# Patient Record
Sex: Male | Born: 1950
Health system: Southern US, Community
[De-identification: ages and names within clinical notes are randomized; demographics above are authoritative.]

## PROBLEM LIST (undated history)

## (undated) DIAGNOSIS — G4733 Obstructive sleep apnea (adult) (pediatric): Secondary | ICD-10-CM

## (undated) DIAGNOSIS — I10 Essential (primary) hypertension: Secondary | ICD-10-CM

## (undated) DIAGNOSIS — R0989 Other specified symptoms and signs involving the circulatory and respiratory systems: Secondary | ICD-10-CM

## (undated) DIAGNOSIS — I2510 Atherosclerotic heart disease of native coronary artery without angina pectoris: Secondary | ICD-10-CM

## (undated) DIAGNOSIS — M21371 Foot drop, right foot: Secondary | ICD-10-CM

## (undated) DIAGNOSIS — K219 Gastro-esophageal reflux disease without esophagitis: Secondary | ICD-10-CM

## (undated) DIAGNOSIS — R011 Cardiac murmur, unspecified: Secondary | ICD-10-CM

## (undated) DIAGNOSIS — K4090 Unilateral inguinal hernia, without obstruction or gangrene, not specified as recurrent: Secondary | ICD-10-CM

## (undated) DIAGNOSIS — I4819 Other persistent atrial fibrillation: Secondary | ICD-10-CM

## (undated) DIAGNOSIS — I350 Nonrheumatic aortic (valve) stenosis: Secondary | ICD-10-CM

## (undated) DIAGNOSIS — E119 Type 2 diabetes mellitus without complications: Secondary | ICD-10-CM

## (undated) HISTORY — DX: Nonrheumatic aortic (valve) stenosis: I35.0

## (undated) HISTORY — PX: BACK SURGERY: SHX140

## (undated) HISTORY — PX: HERNIA REPAIR: SHX51

## (undated) HISTORY — DX: Essential (primary) hypertension: I10

## (undated) HISTORY — PX: COLONOSCOPY: SHX174

## (undated) HISTORY — DX: Obstructive sleep apnea (adult) (pediatric): G47.33

## (undated) HISTORY — DX: Other persistent atrial fibrillation: I48.19

## (undated) HISTORY — PX: CERVICAL SPINE SURGERY: SHX589

## (undated) HISTORY — PX: APPENDECTOMY: SHX54

## (undated) HISTORY — PX: EYE SURGERY: SHX253

---
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
## 1998-04-25 ENCOUNTER — Encounter: Payer: Self-pay | Admitting: Cardiology

## 1998-04-25 ENCOUNTER — Ambulatory Visit (HOSPITAL_COMMUNITY): Admission: RE | Admit: 1998-04-25 | Discharge: 1998-04-25 | Payer: Self-pay | Admitting: Cardiology

## 2004-10-26 ENCOUNTER — Ambulatory Visit (HOSPITAL_COMMUNITY): Admission: RE | Admit: 2004-10-26 | Discharge: 2004-10-26 | Payer: Self-pay | Admitting: Chiropractic Medicine

## 2004-10-26 IMAGING — CR DG FOOT COMPLETE 3+V*R*
3 series · 3 of 3 positions shown · non-contrast
Comparison: None.

CLINICAL DATA: Injured right foot. 
 RIGHT FOOT - THREE VIEW:

[t foot ap right]
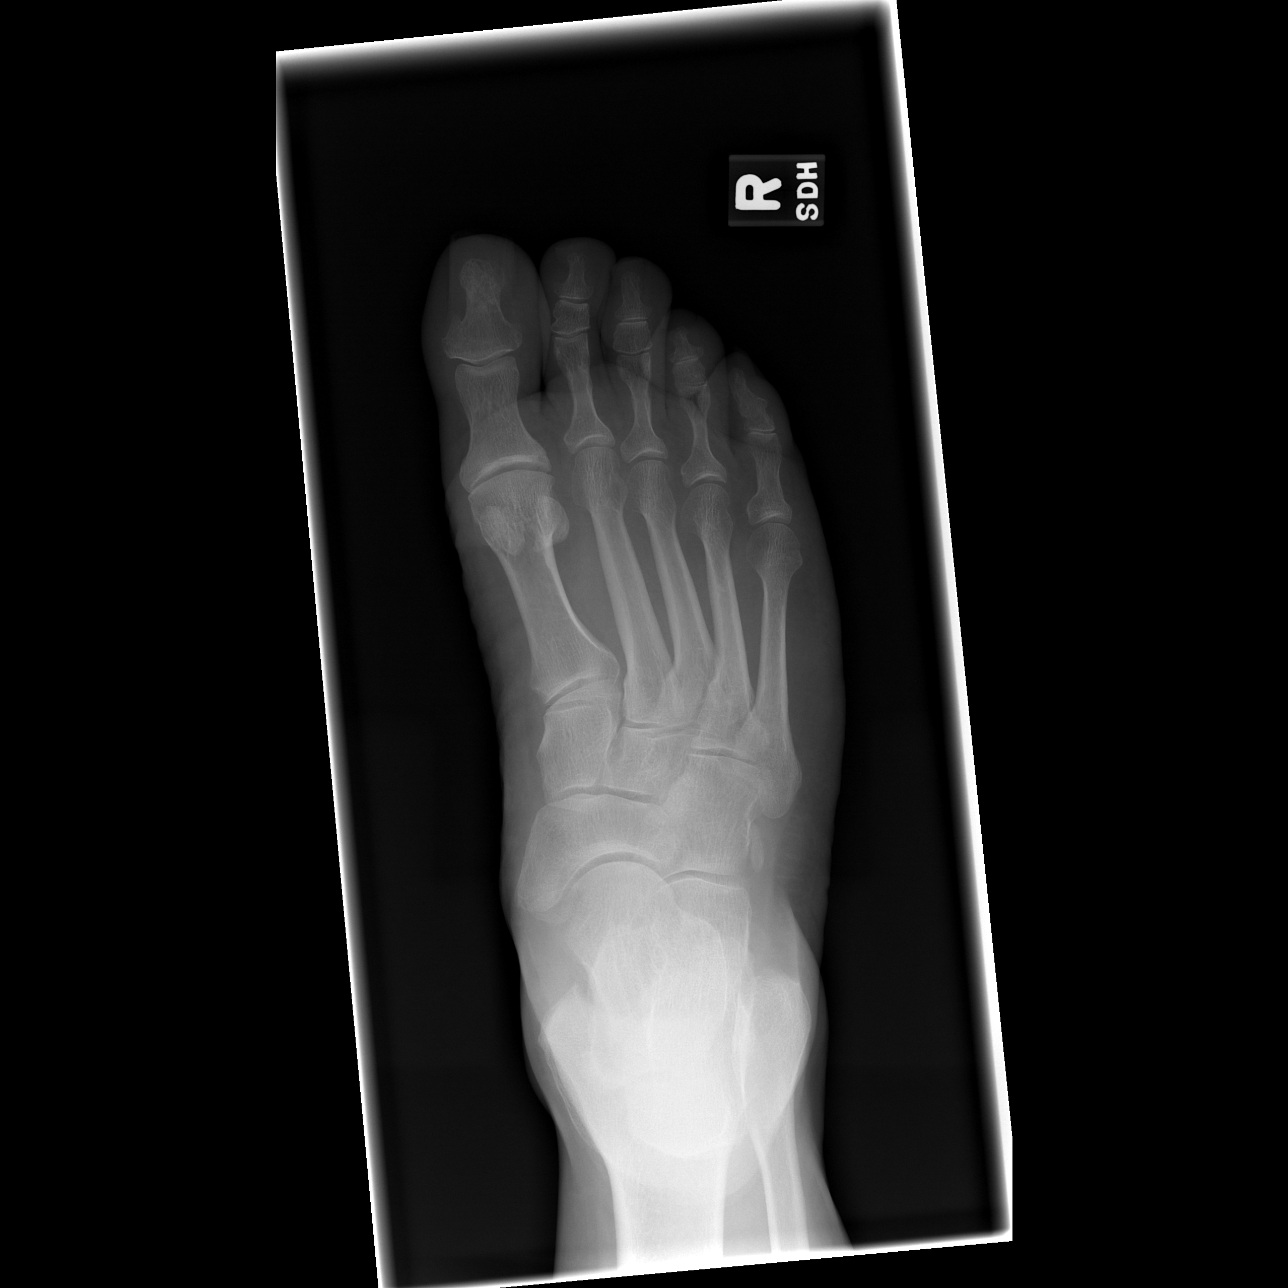

[t foot oblique right]
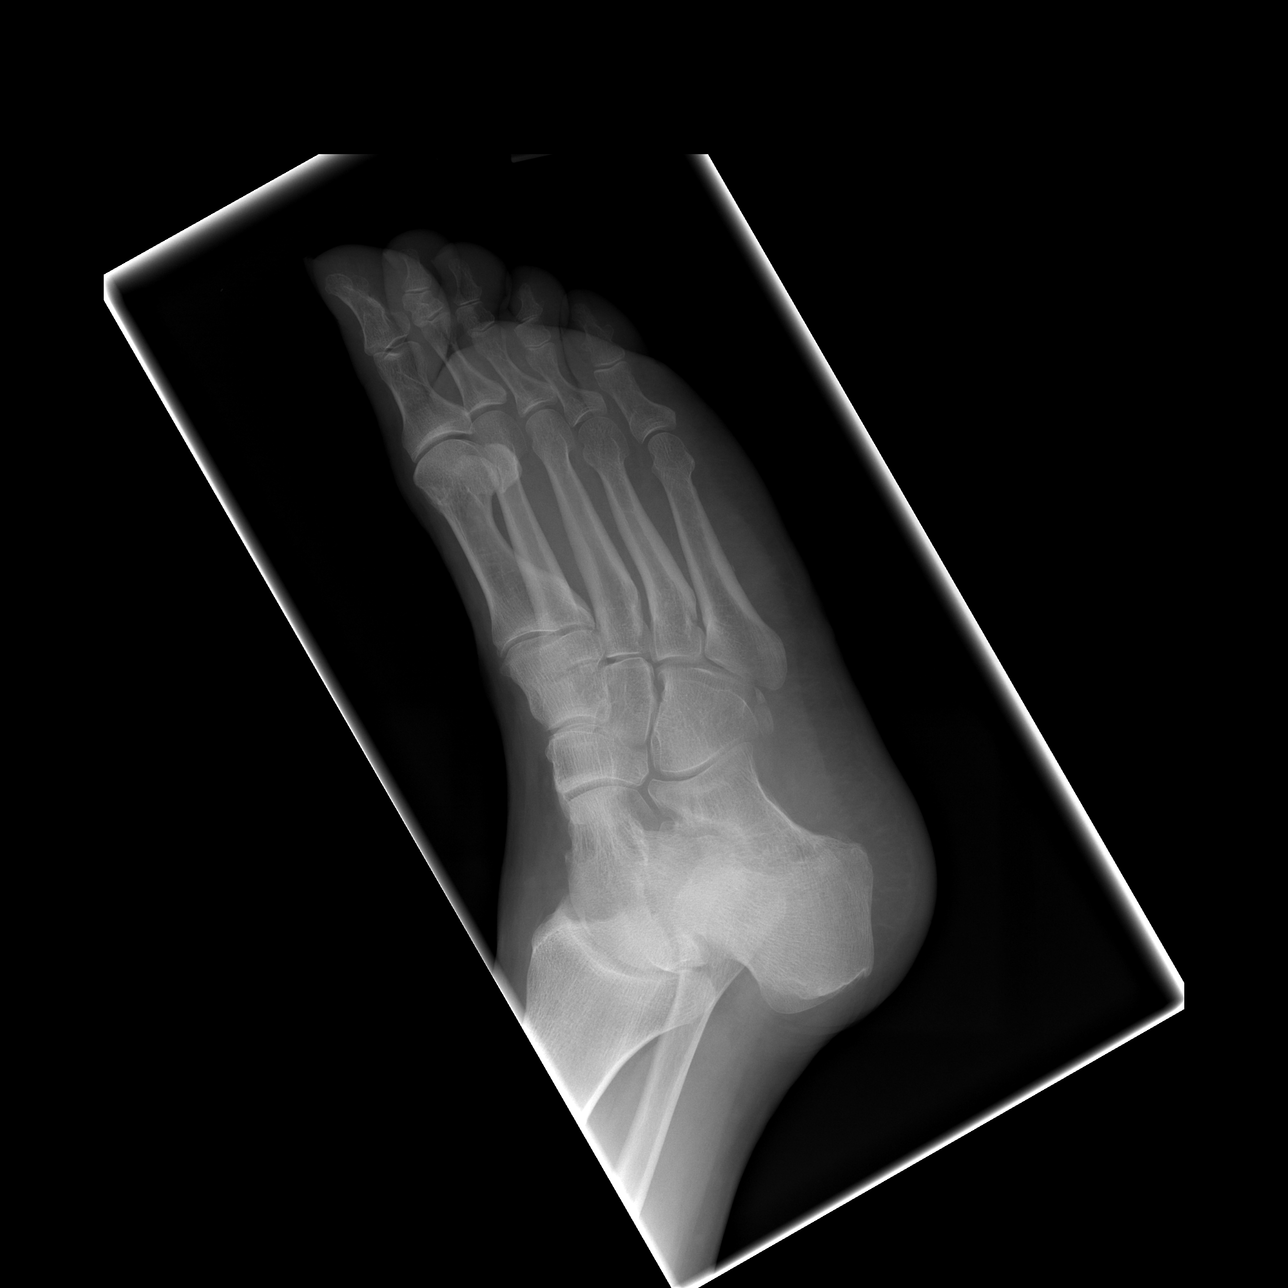

[t foot lat right]
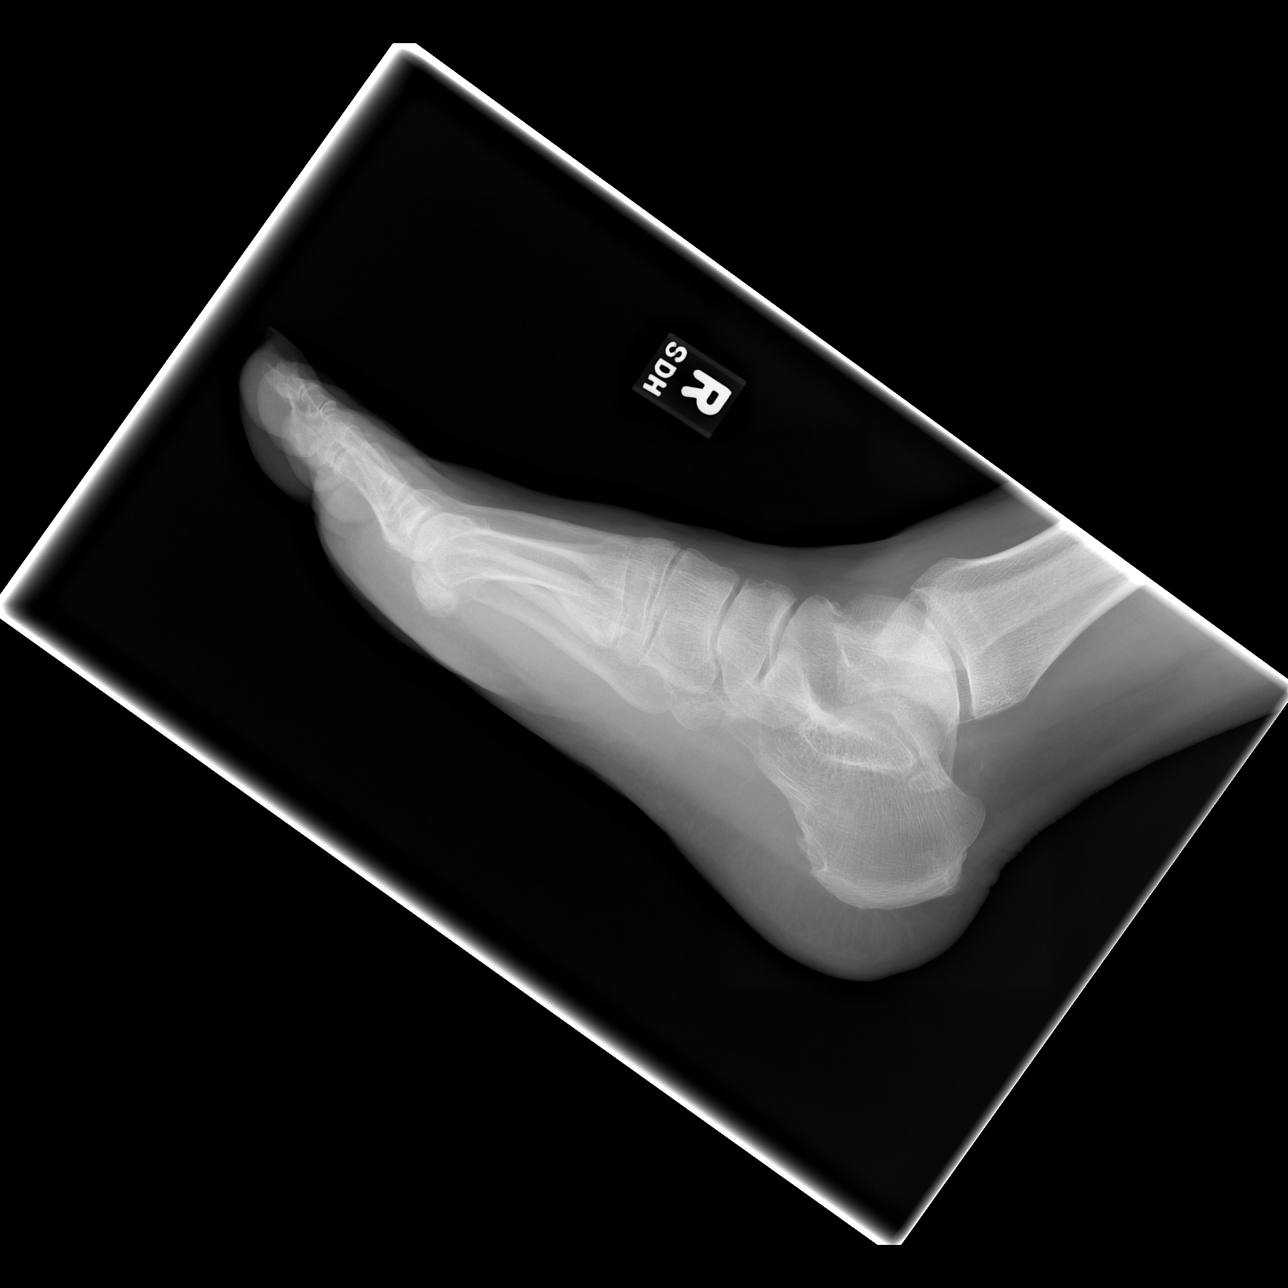

[3 of 3 positions shown; findings below may reference images not displayed]

No acute fracture is identified.  On the oblique view, there is an area of questionable periosteal bony density of the proximal portion of the 5th metatarsal.
IMPRESSION: Questionable old small avulsion fracture of the right 5th metatarsal.  No acute fracture.   If symptoms do persist, isotope bone scan would be suggested for further evaluation.  The accessory ossicle lateral to the cuboid bone is thought to be unremarkable however an isotope bone scan may be of help for evaluation of this ossicle.

## 2005-10-20 ENCOUNTER — Ambulatory Visit (HOSPITAL_COMMUNITY): Admission: RE | Admit: 2005-10-20 | Discharge: 2005-10-20 | Payer: Self-pay | Admitting: Chiropractic Medicine

## 2005-10-20 IMAGING — CR DG LUMBAR SPINE COMPLETE 4+V
5 series · 5 of 5 positions shown · non-contrast
Comparison: None.

CLINICAL DATA: Pain over sacroiliac joint and low back pain.  
 LUMBAR SPINE ? 4 VIEW ? [DATE]:

[t l-spine a.p.]
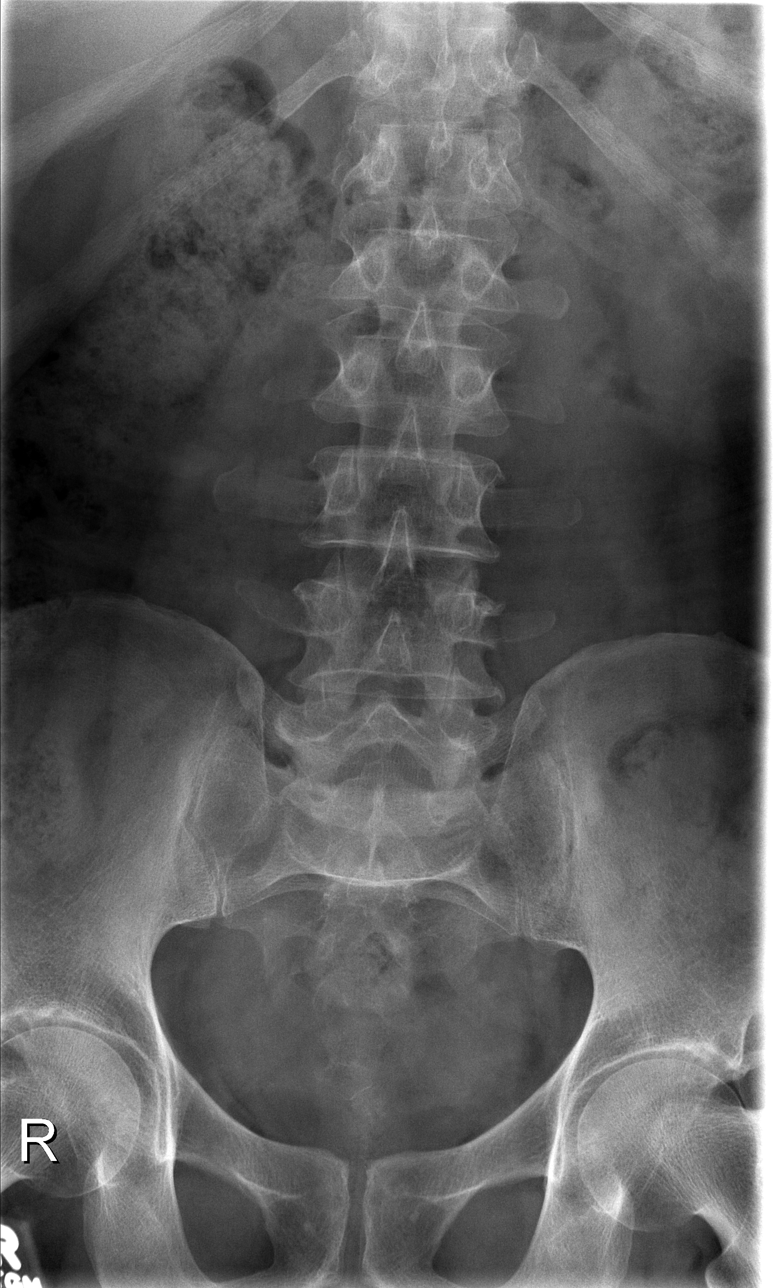

[t l-spine oblique exposure (1 of 2)]
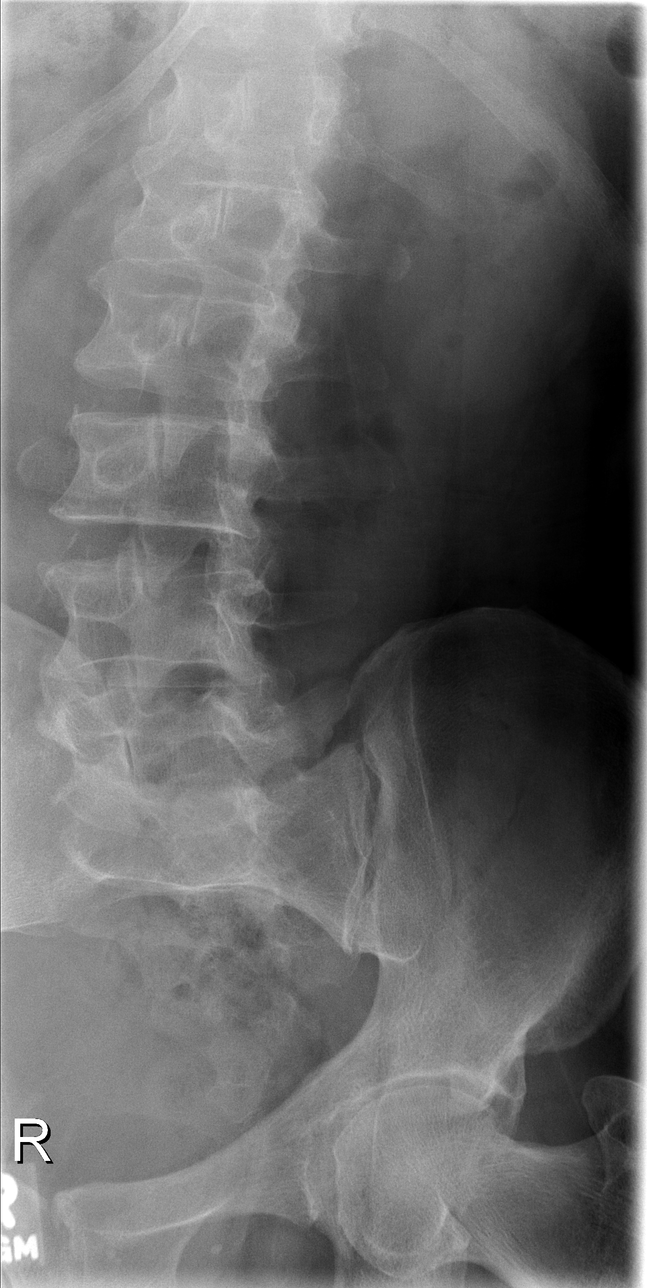

[t l-spine oblique exposure (2 of 2)]
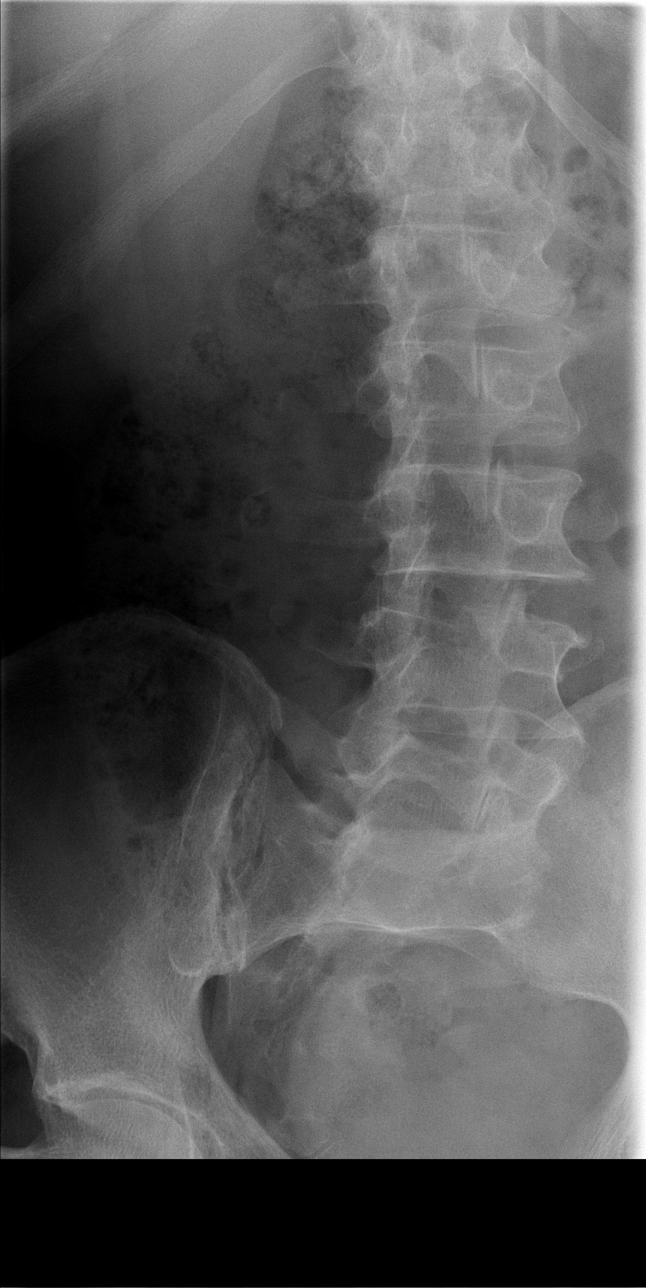

[t l-spine lat]
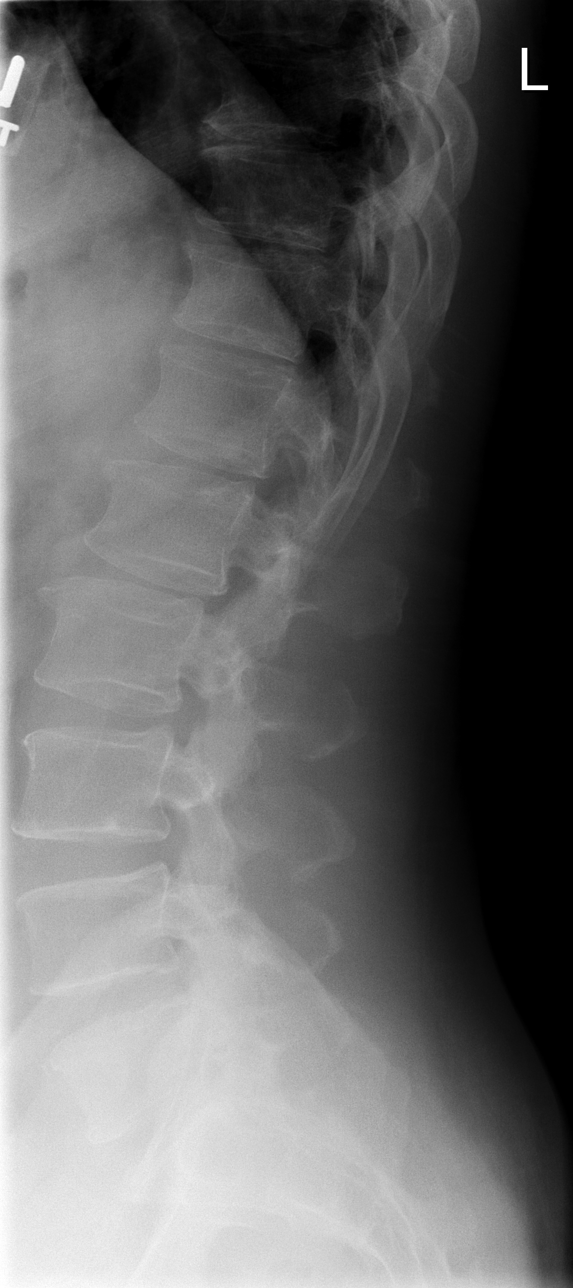

[t l-spine l5-s1 spot]
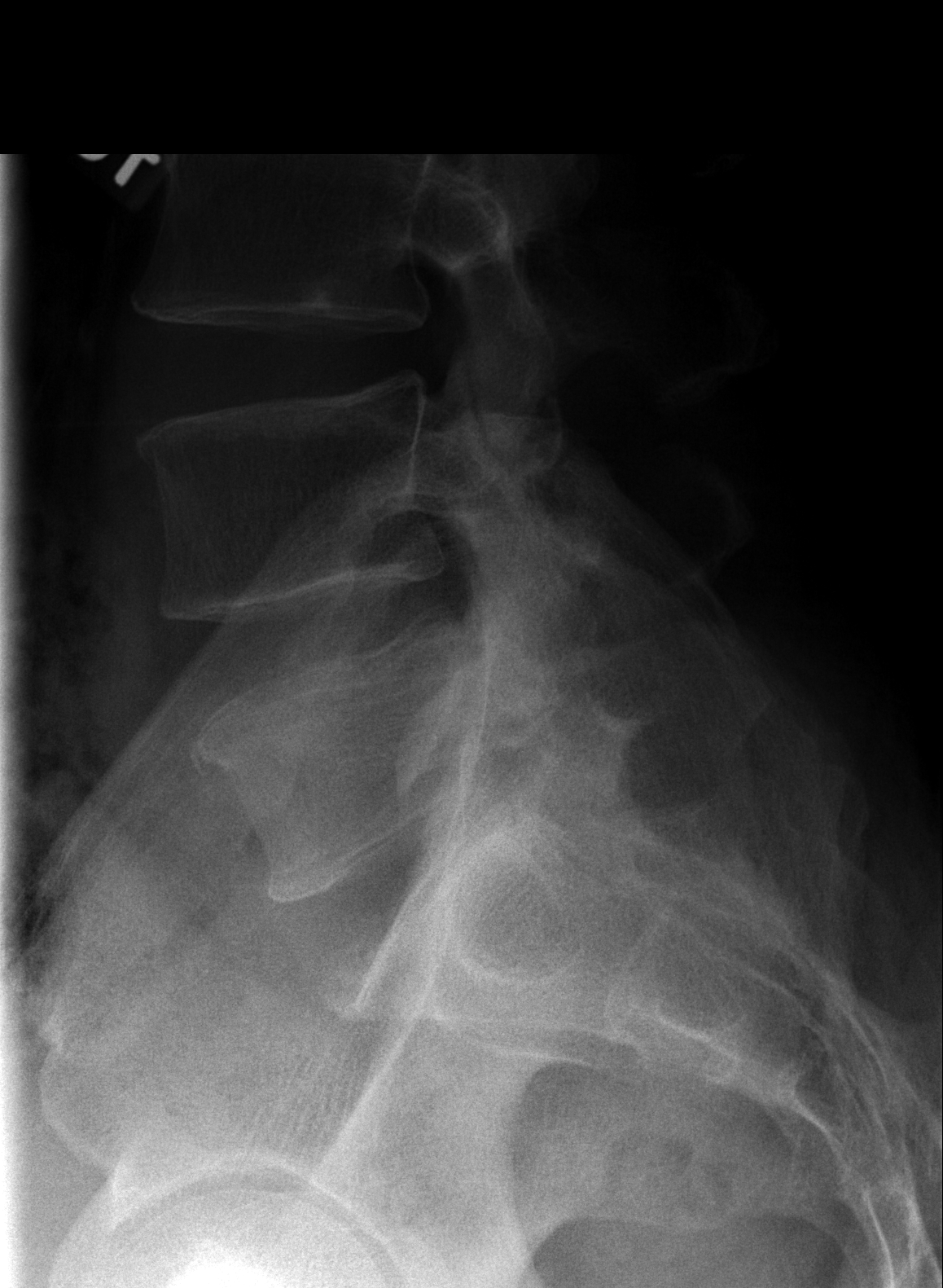

[5 of 5 positions shown; findings below may reference images not displayed]

FINDINGS: There is slight retrolisthesis of L1 on L2.  Alignment is otherwise anatomic.  Mild end plate degenerative changes are seen throughout with loss of disc space height at L1-L2.  Facet hypertrophic changes seen in the lower lumbar spine.
IMPRESSION: Spondylosis with slight retrolisthesis of L1 on L2.

## 2005-10-20 IMAGING — CR DG THORACIC SPINE 2V
4 series · 4 of 4 positions shown · non-contrast
Comparison: None.

CLINICAL DATA: Sacroiliac joint and low back pain, worsening. 
 THORACIC SPINE ? 2 VIEW ? [DATE]:

[t t-spine a.p.]
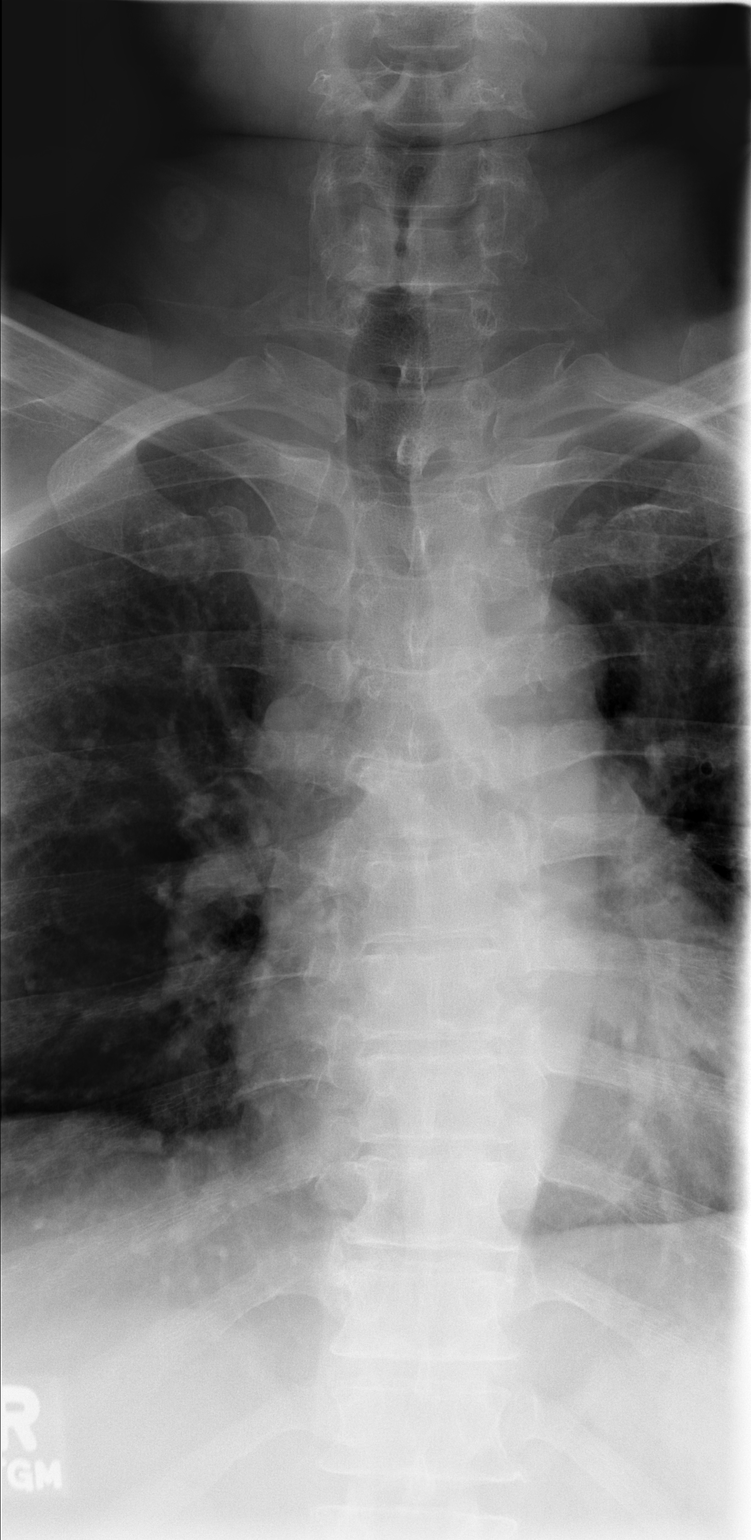

[t t-spine lat (1 of 2)]
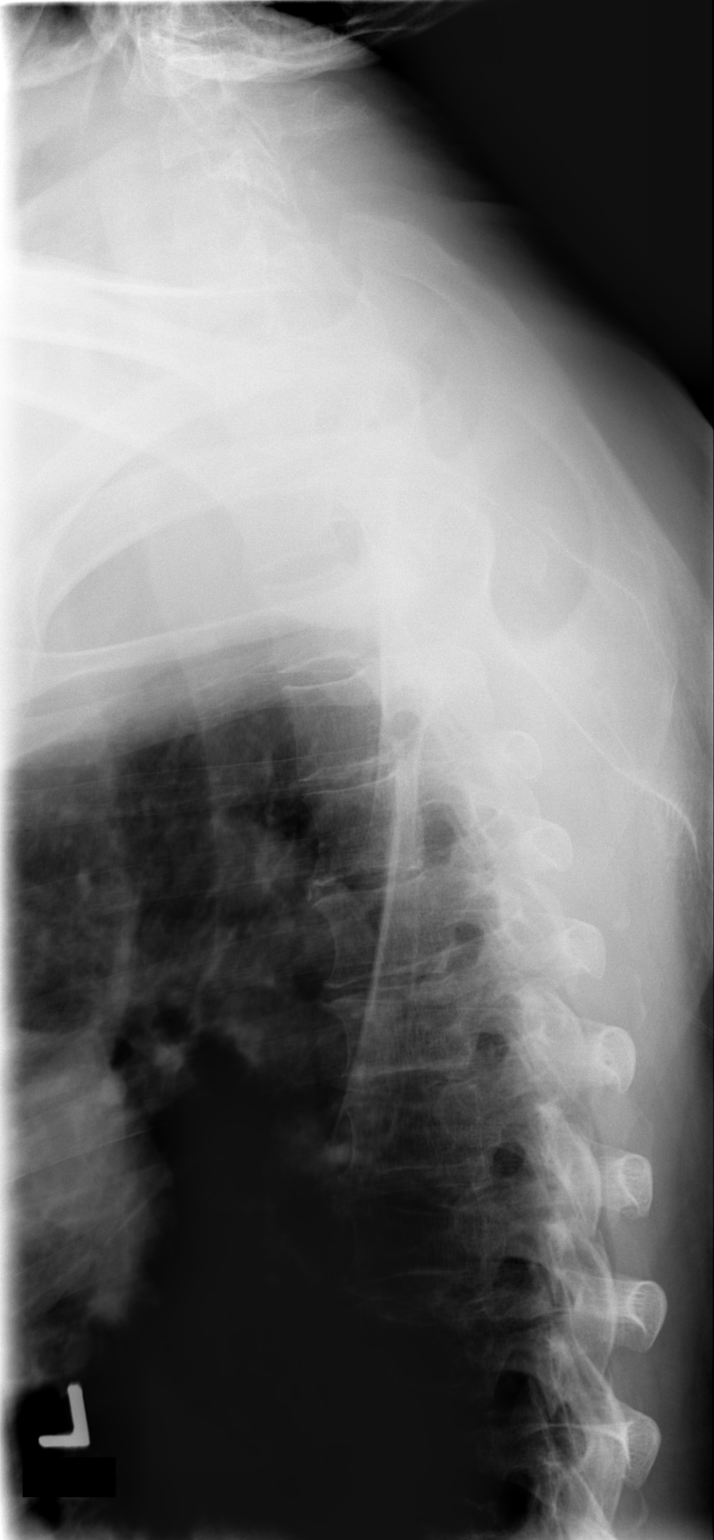

[t t-spine lat (2 of 2)]
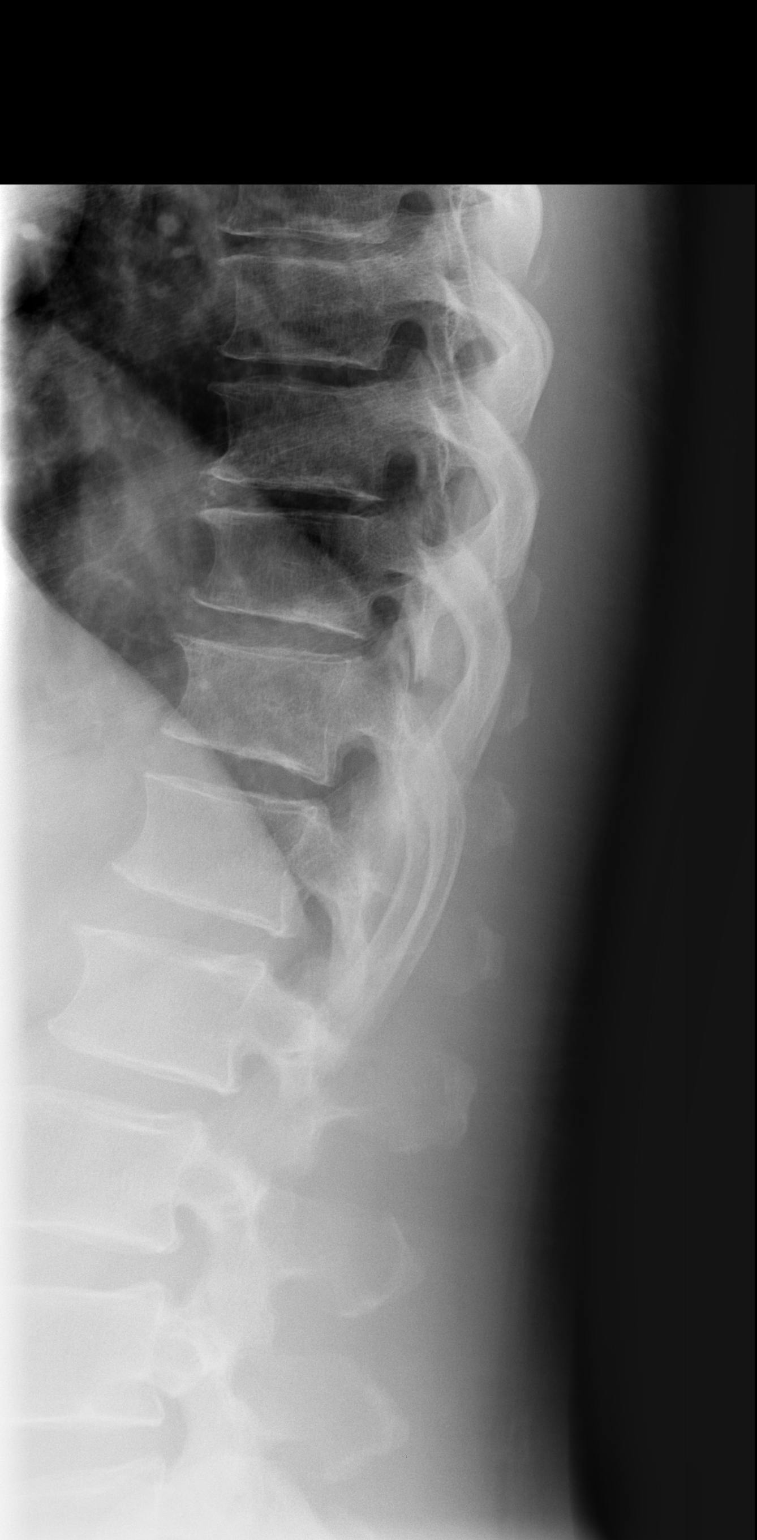

[t swimmers]
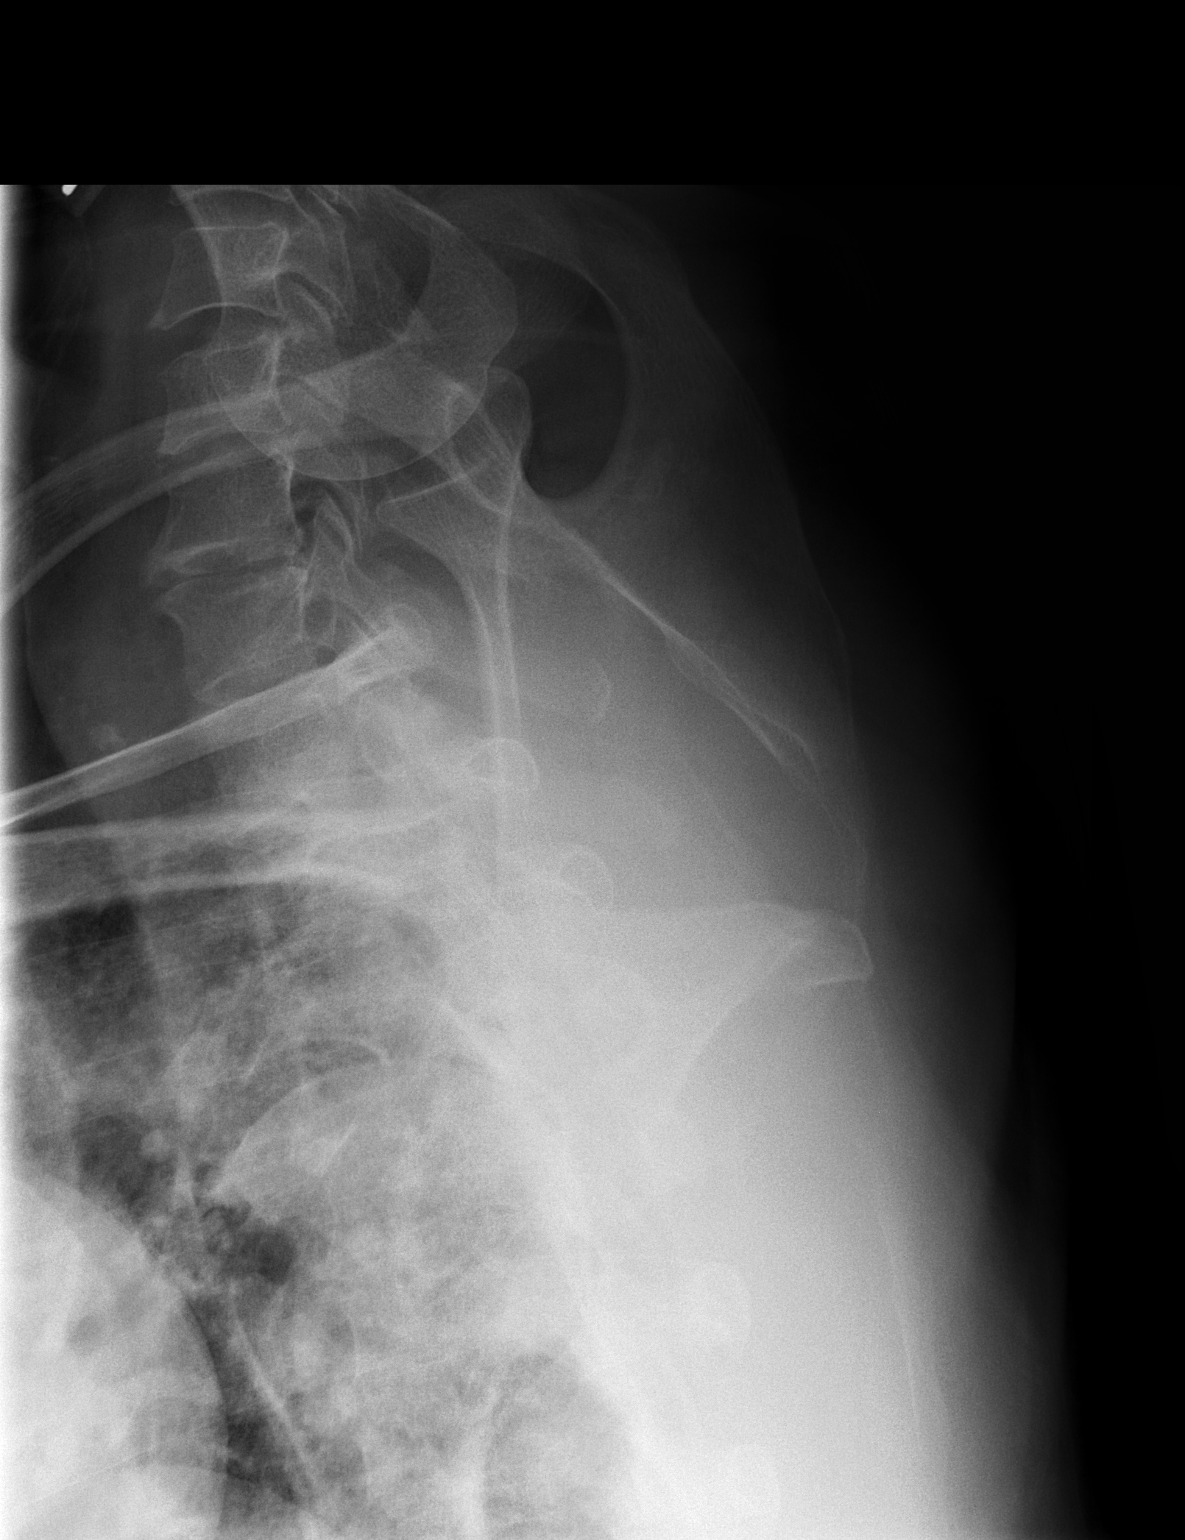

[4 of 4 positions shown; findings below may reference images not displayed]

FINDINGS: Alignment is anatomic in the thoracic spine.  There are end plate degenerative changes throughout.  Vertebral body height is maintained.
IMPRESSION: Spondylosis.

## 2008-06-03 ENCOUNTER — Encounter: Admission: RE | Admit: 2008-06-03 | Discharge: 2008-06-03 | Payer: Self-pay | Admitting: Chiropractic Medicine

## 2008-06-03 IMAGING — CR DG CHEST 2V
2 series · 2 of 2 positions shown · non-contrast
Comparison: None

CLINICAL DATA: Pneumonia

CHEST - 2 VIEW

[view not recorded (1 of 2)]
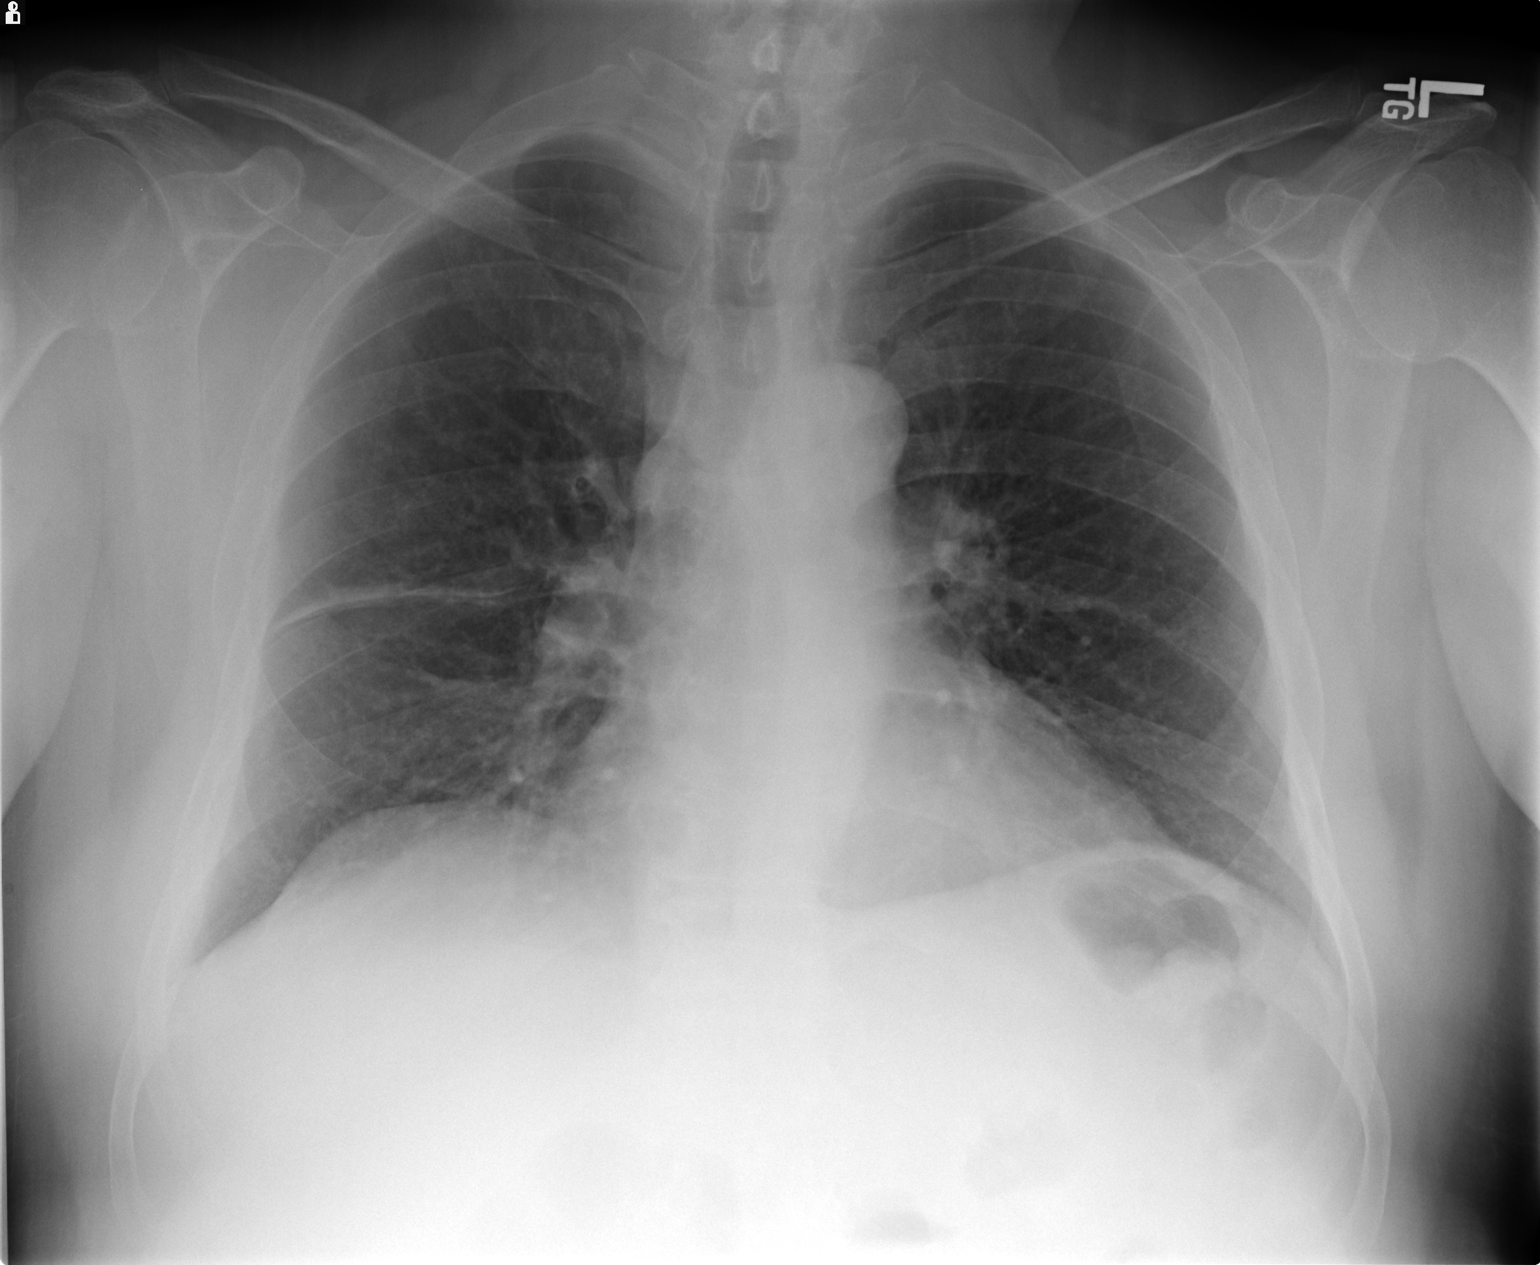

[view not recorded (2 of 2)]
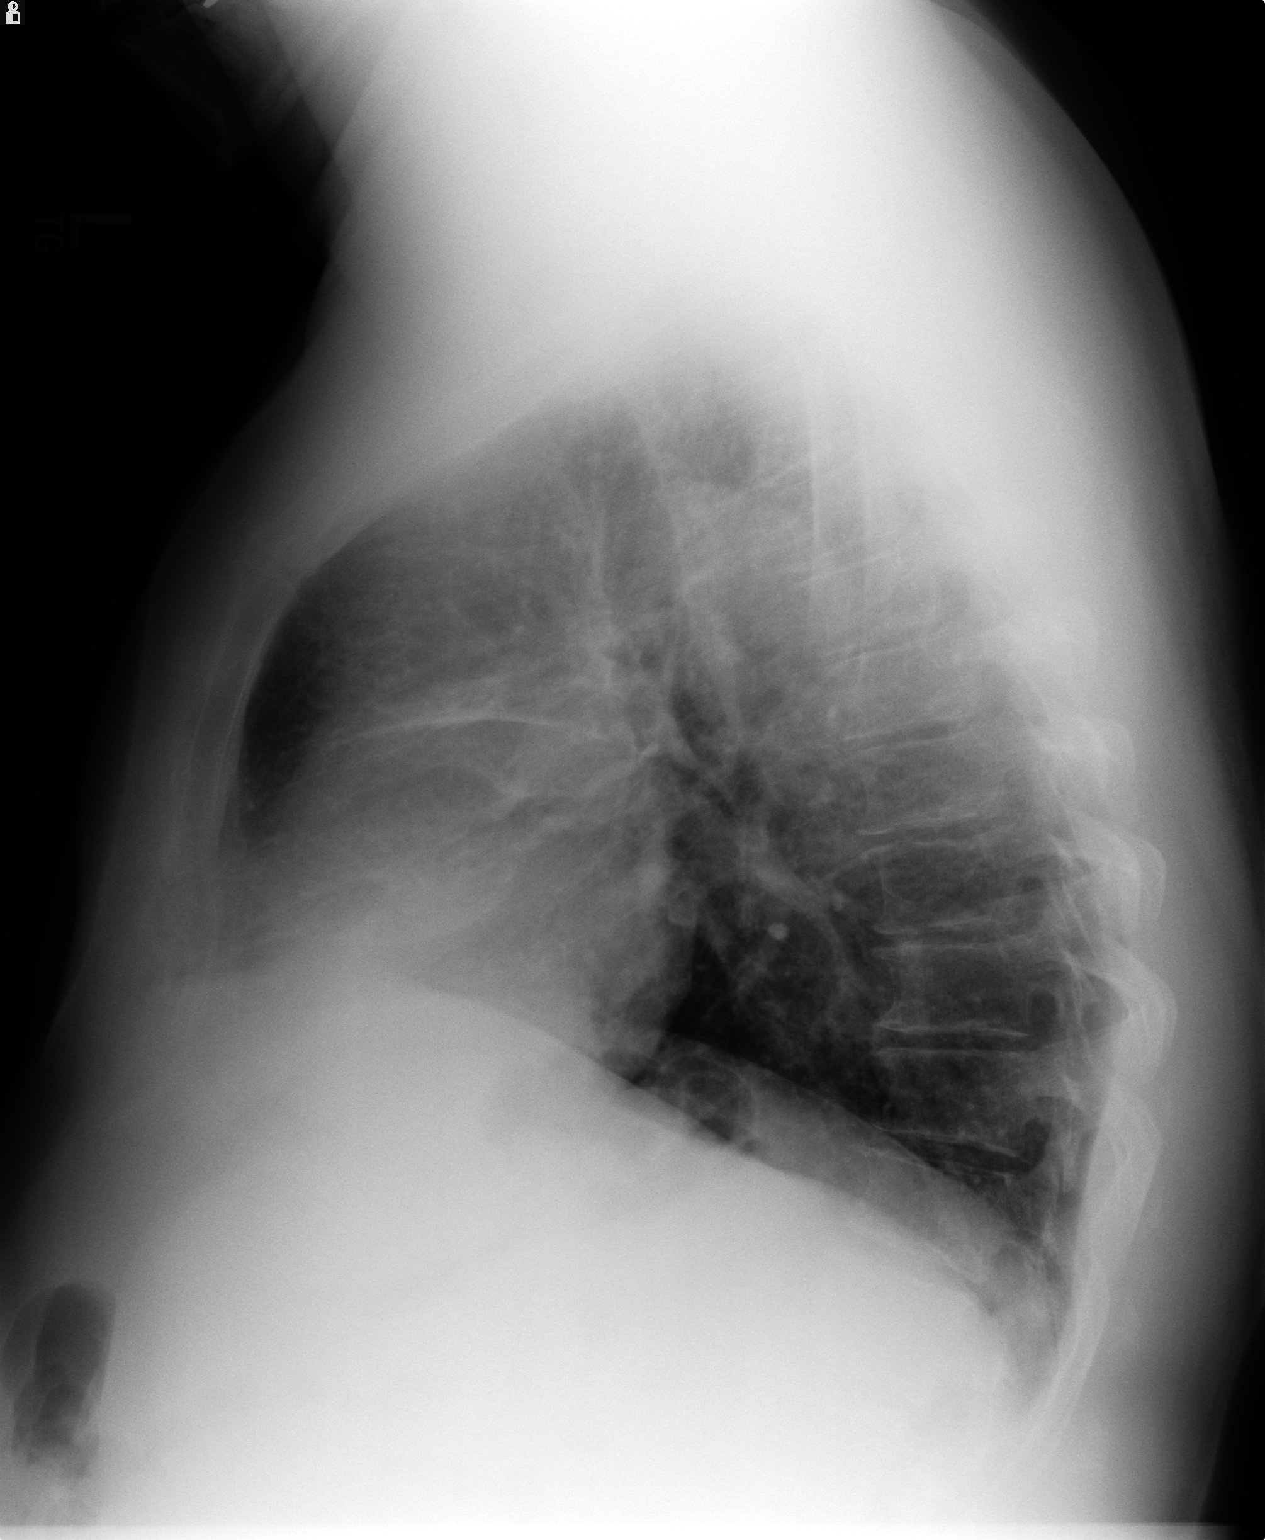

[2 of 2 positions shown; findings below may reference images not displayed]

FINDINGS: Linear atelectasis or scar in the inferior right middle
lobe is noted.  Lungs are under aerated.  Heart is mildly enlarged.
Pulmonary vascularity is within normal limits.  Bronchitic changes
are noted.  No pneumothorax or effusion.
IMPRESSION: Linear atelectasis or scar in the right upper lobe.  Low lung
volumes.

## 2010-04-24 ENCOUNTER — Ambulatory Visit (HOSPITAL_COMMUNITY)
Admission: RE | Admit: 2010-04-24 | Discharge: 2010-04-24 | Payer: Self-pay | Source: Home / Self Care | Attending: Chiropractic Medicine | Admitting: Chiropractic Medicine

## 2010-04-24 IMAGING — CR DG FOOT COMPLETE 3+V*R*
3 series · 3 of 3 positions shown · non-contrast
Comparison: [DATE].

CLINICAL DATA: Pain distal right fifth metatarsal for 3 months.

RIGHT FOOT COMPLETE - 3+ VIEW

[t foot ap right]
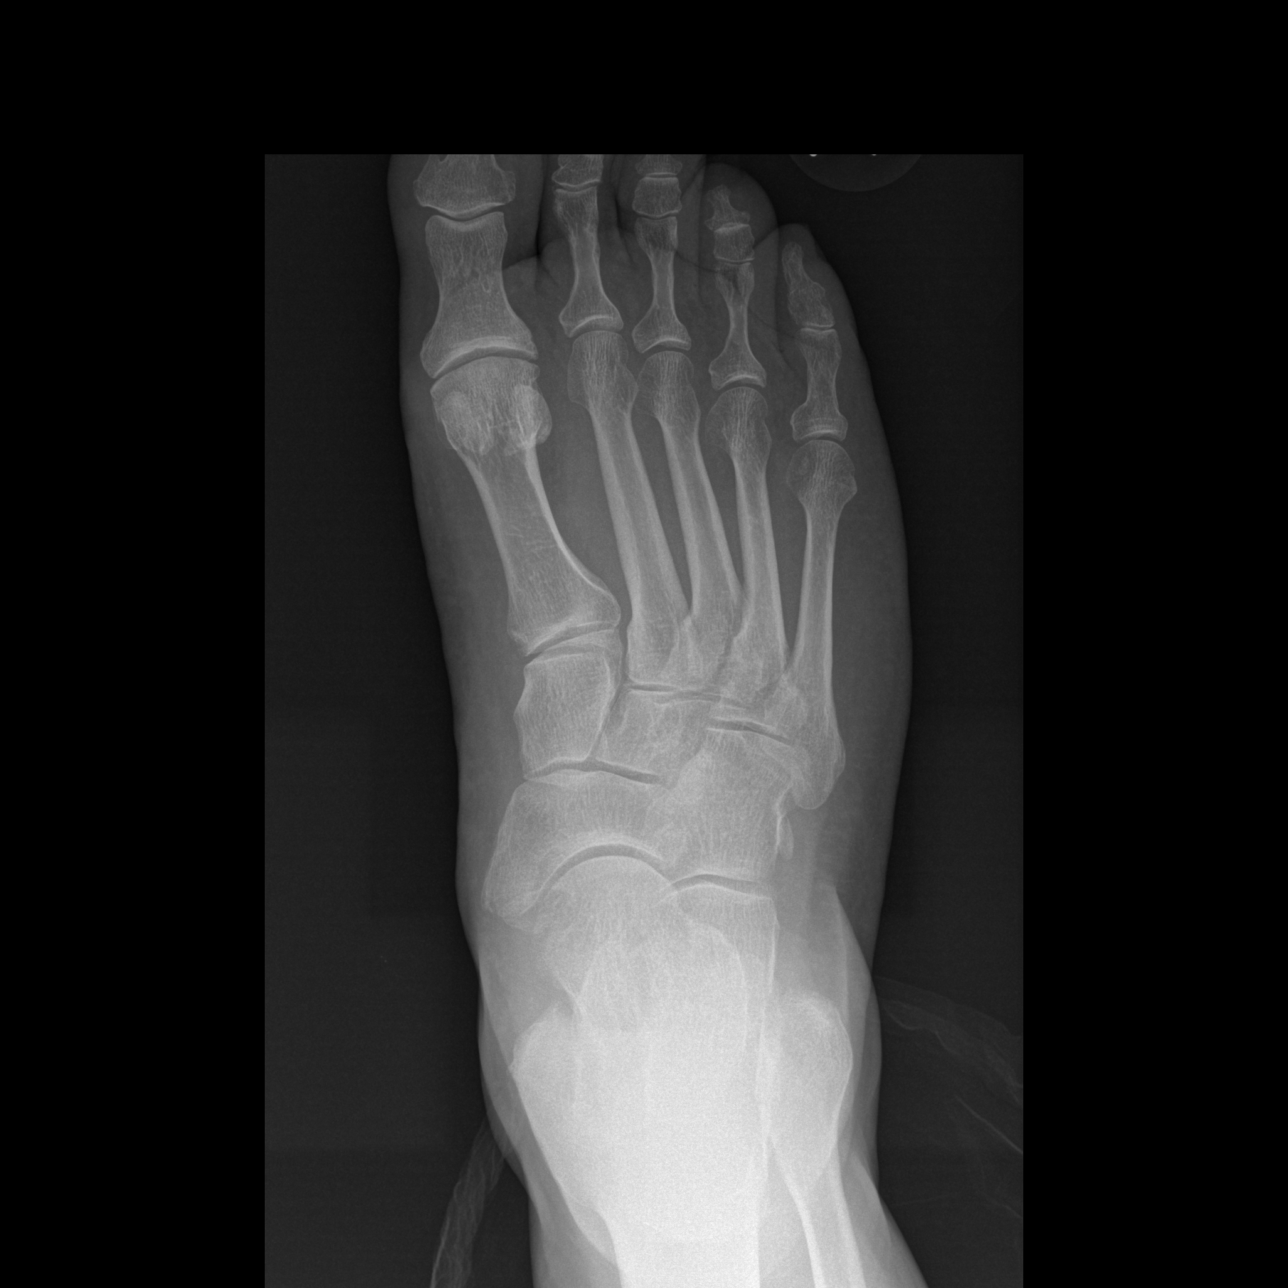

[t foot oblique right]
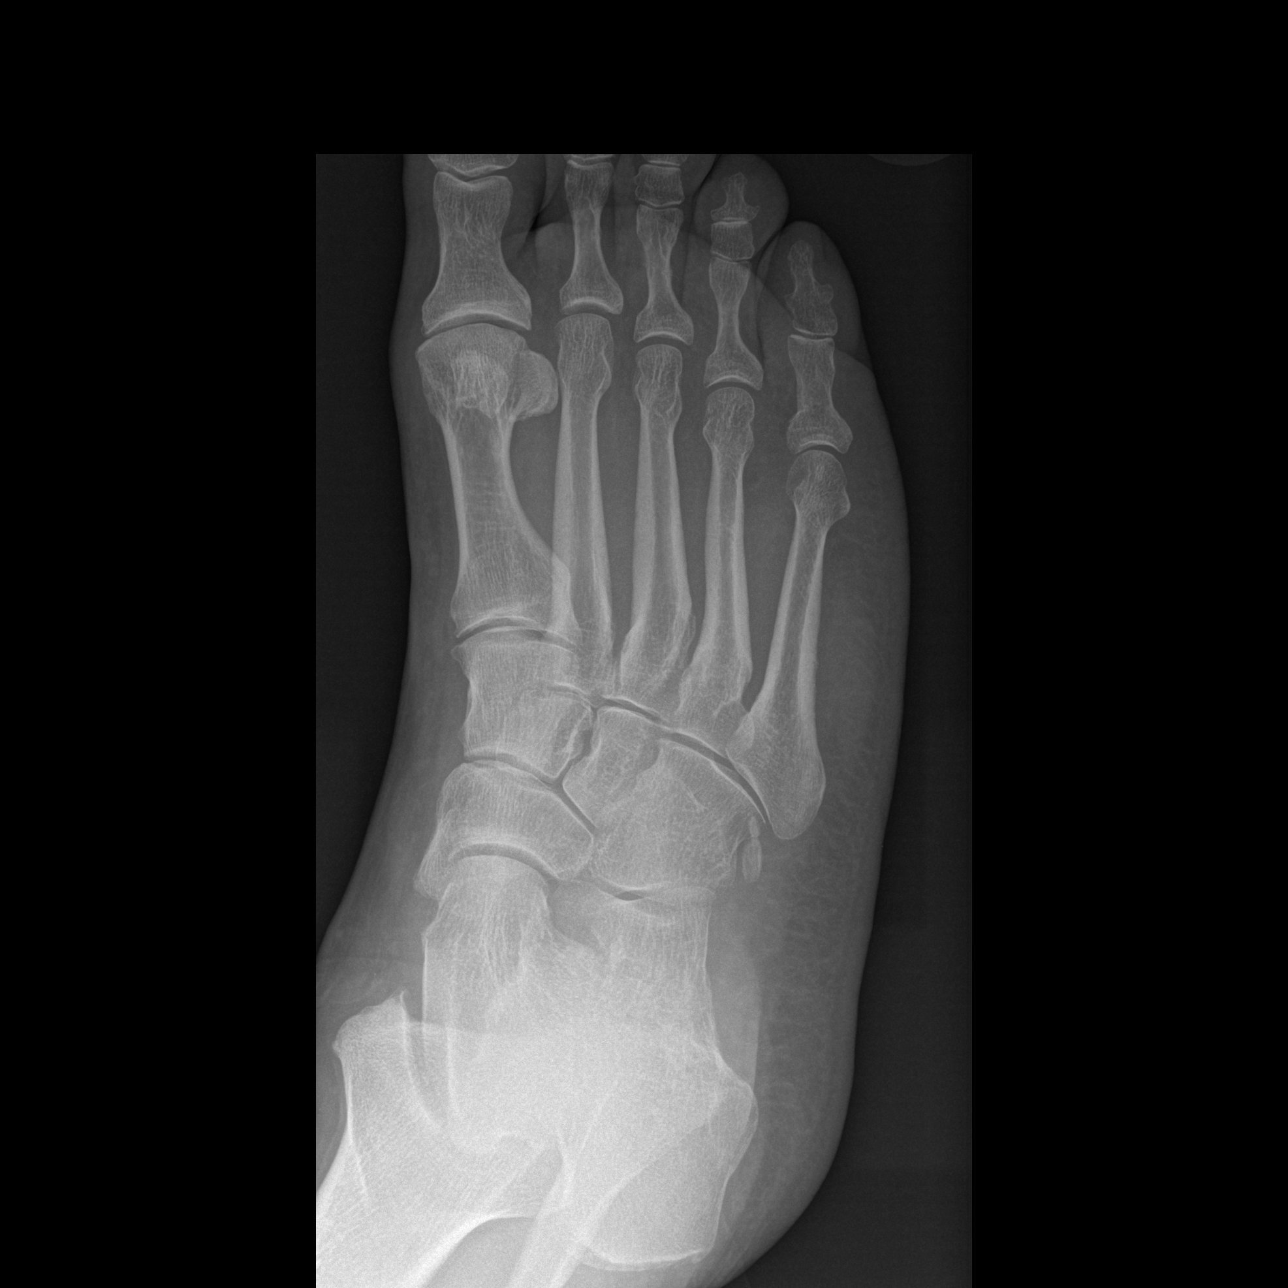

[t foot lat right *]
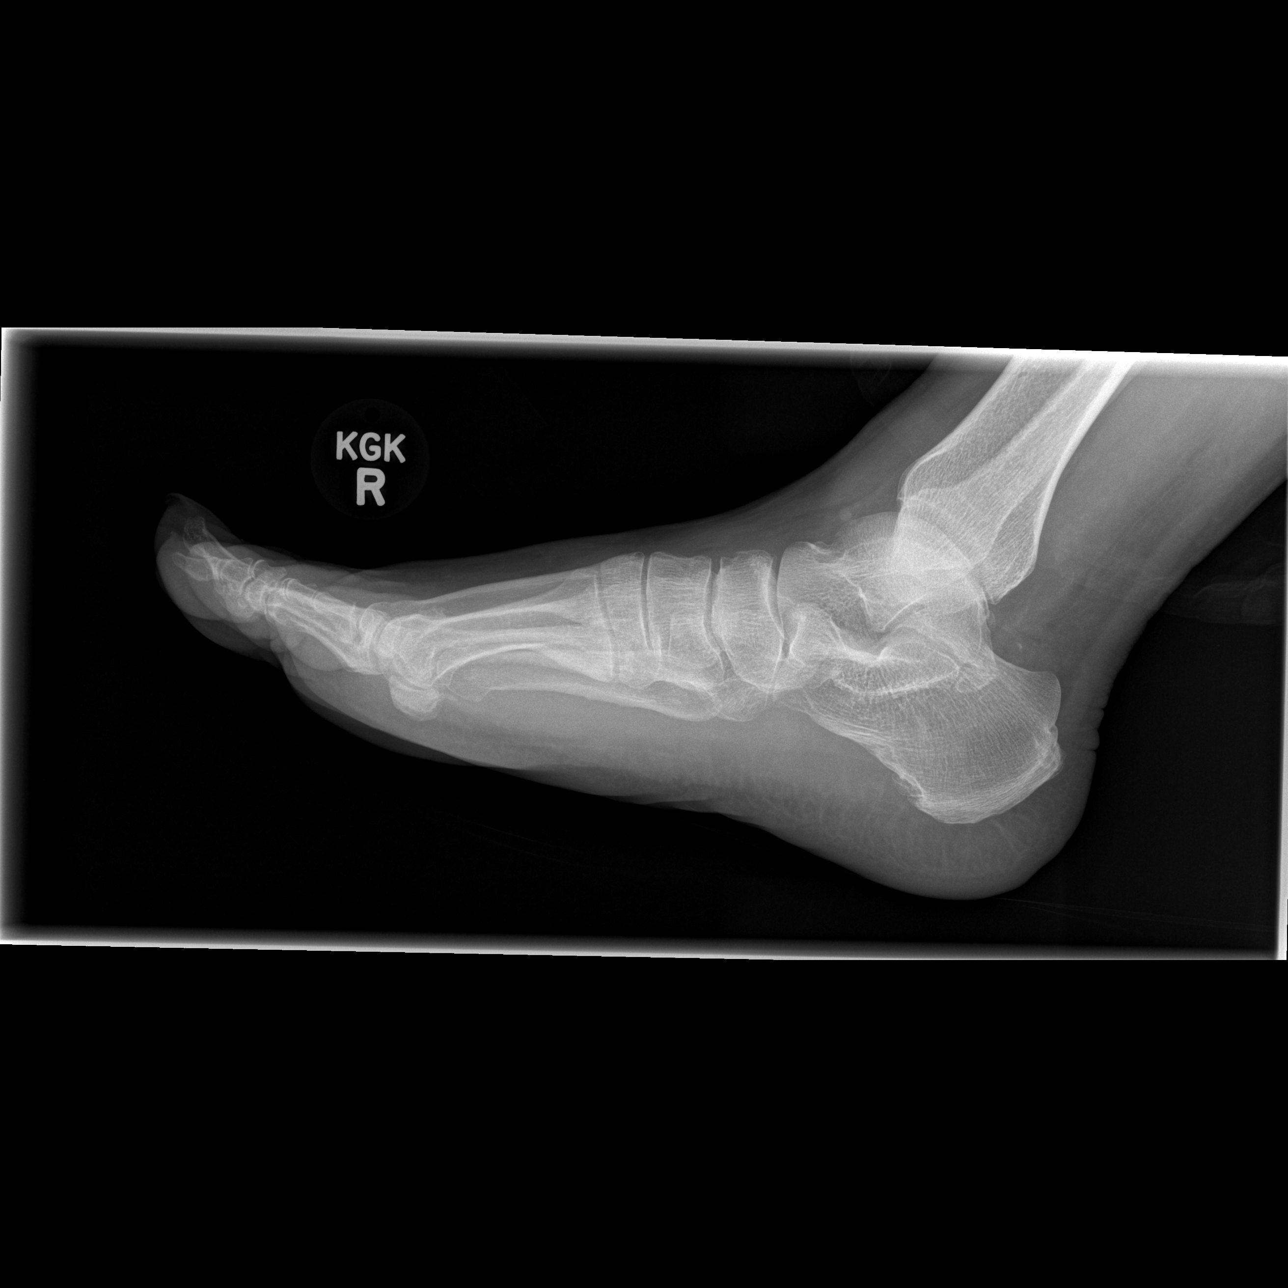

[3 of 3 positions shown; findings below may reference images not displayed]

FINDINGS: No acute fracture or dislocation noted.  Minimal cortical
thickening proximal aspect of the right fifth metatarsal may be
related to remote injury.  If there are persistent or progressive
symptoms, MR imaging can be obtained to help exclude the stress
injury or soft tissue injury.
IMPRESSION: No acute fracture.  Please see above.

## 2010-05-03 ENCOUNTER — Encounter: Payer: Self-pay | Admitting: Chiropractic Medicine

## 2010-07-02 ENCOUNTER — Ambulatory Visit (HOSPITAL_COMMUNITY): Payer: Self-pay | Attending: Cardiology

## 2012-12-20 ENCOUNTER — Other Ambulatory Visit: Payer: Self-pay | Admitting: Cardiology

## 2012-12-20 ENCOUNTER — Ambulatory Visit
Admission: RE | Admit: 2012-12-20 | Discharge: 2012-12-20 | Disposition: A | Discharge status: Home / Self Care | Payer: BC Managed Care – PPO | Source: Ambulatory Visit | Attending: Cardiology | Admitting: Cardiology

## 2012-12-20 DIAGNOSIS — J069 Acute upper respiratory infection, unspecified: Secondary | ICD-10-CM

## 2012-12-20 DIAGNOSIS — R079 Chest pain, unspecified: Secondary | ICD-10-CM

## 2012-12-20 IMAGING — CR DG CHEST 2V
2 series · 2 of 2 positions shown · non-contrast
Comparison: [DATE]

CLINICAL DATA: Shortness of breath

CHEST - 2 VIEW

[w chest pa]
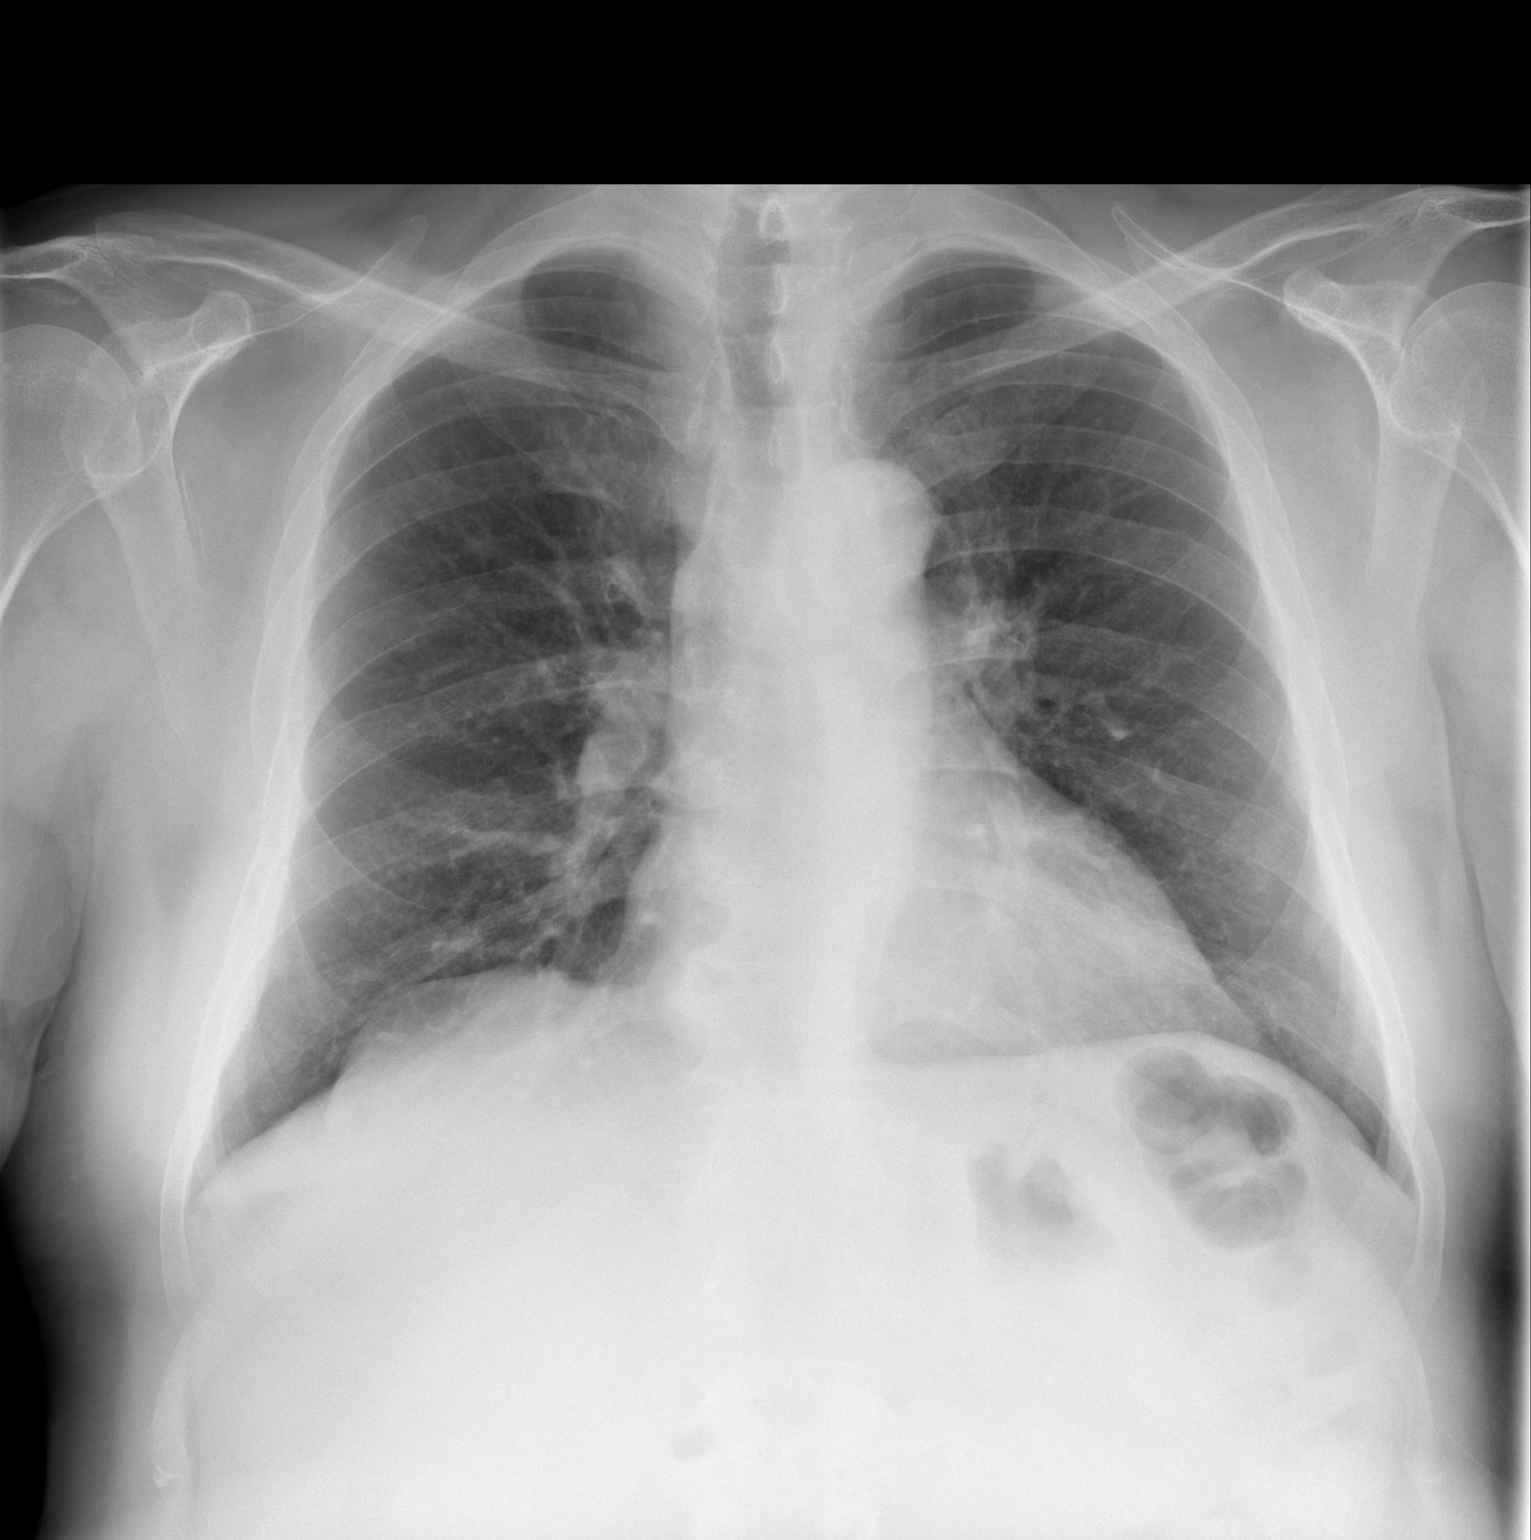

[w chest lat]
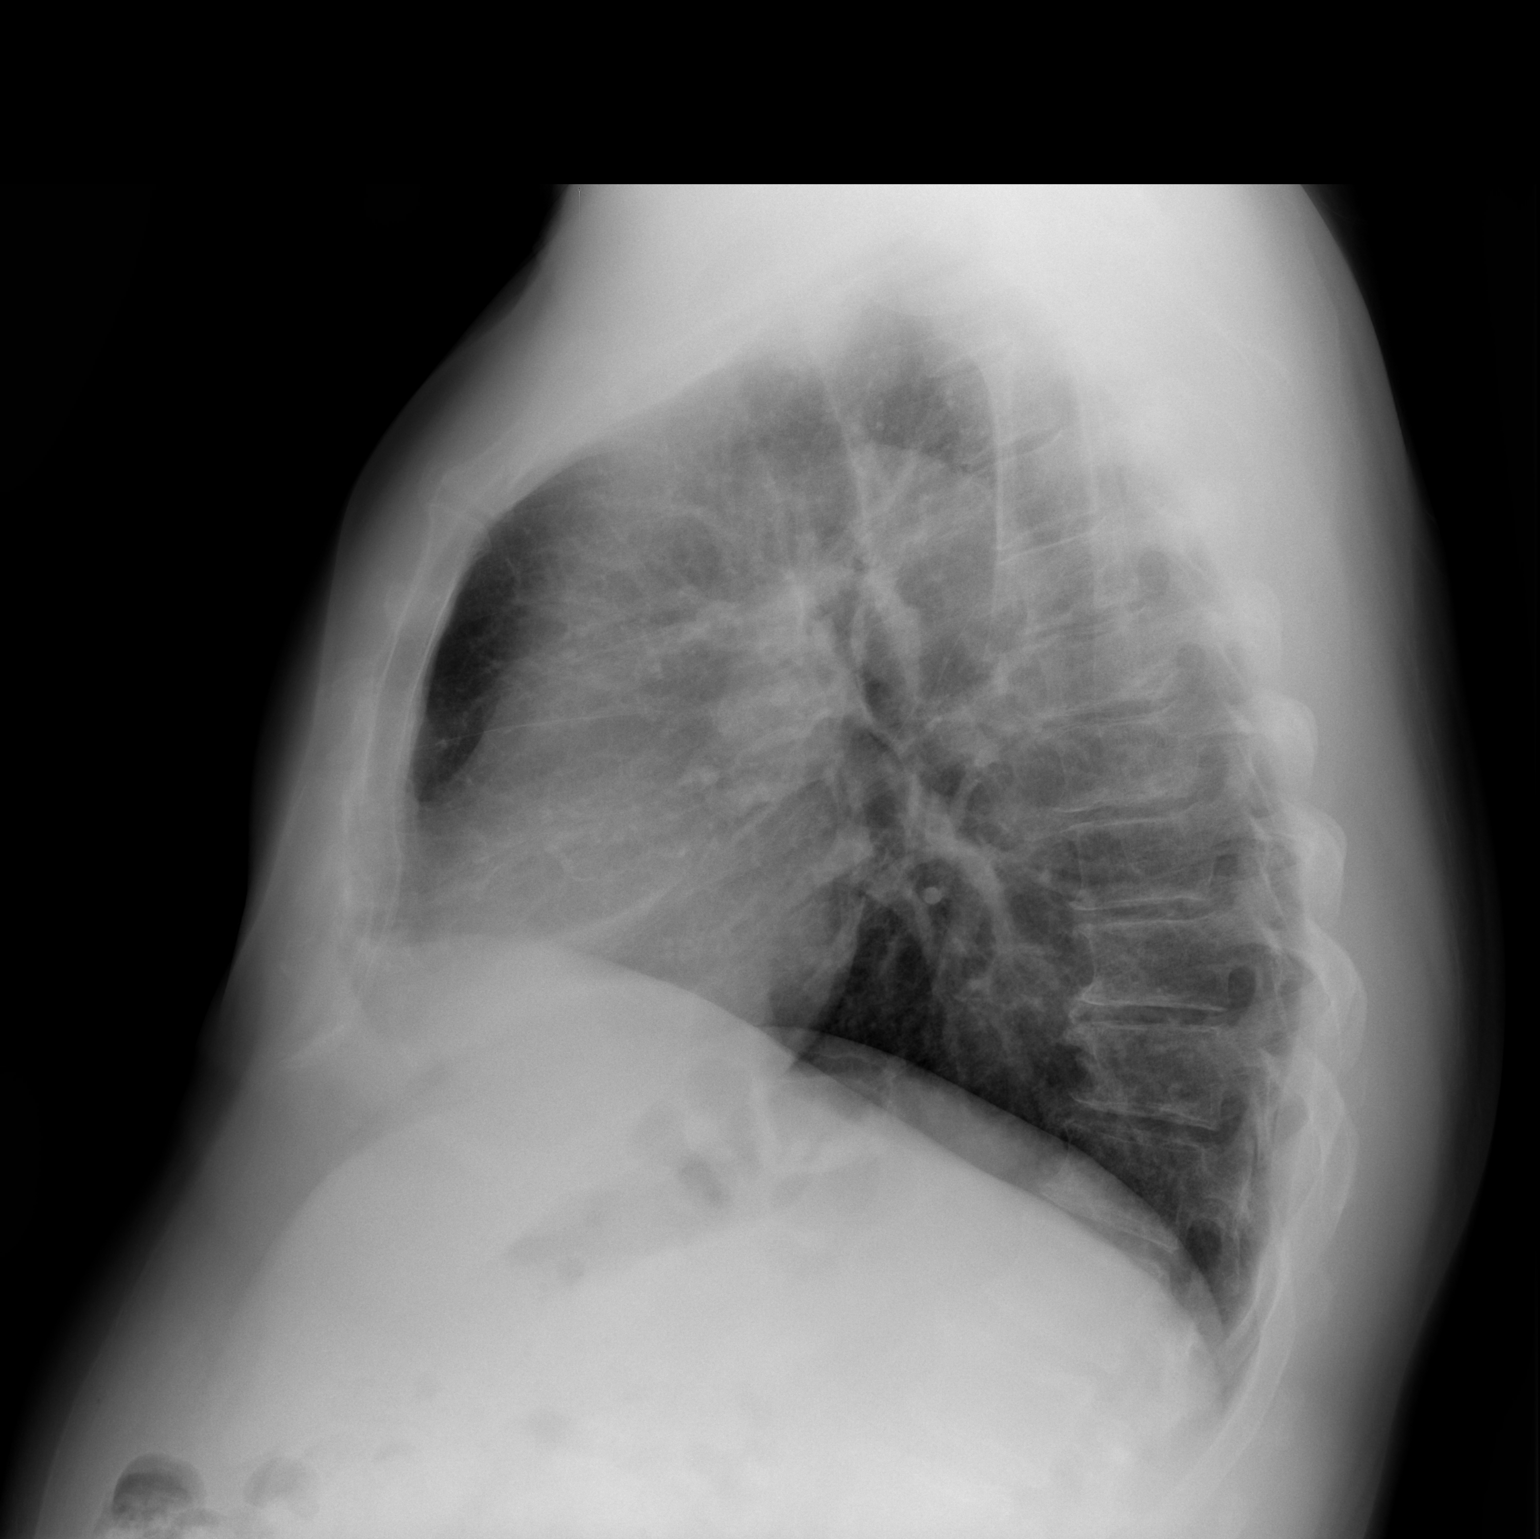

[2 of 2 positions shown; findings below may reference images not displayed]

FINDINGS: The lungs are clear without focal infiltrate, edema,
pneumothorax or pleural effusion. Interstitial markings are
diffusely coarsened with chronic features. The cardiopericardial
silhouette is within normal limits for size. Imaged bony structures
of the thorax are intact.
IMPRESSION: Stable exam.  No acute cardiopulmonary findings.

## 2013-01-10 ENCOUNTER — Institutional Professional Consult (permissible substitution): Payer: BC Managed Care – PPO | Admitting: Internal Medicine

## 2014-01-25 ENCOUNTER — Telehealth (HOSPITAL_COMMUNITY): Payer: Self-pay | Admitting: Surgery

## 2014-01-25 NOTE — Telephone Encounter (Signed)
Called to work in patient for an appointment per Jesusita Oka. - Appt made for Tuesday October 20th at 10 am.

## 2014-01-29 ENCOUNTER — Encounter (HOSPITAL_COMMUNITY): Payer: Self-pay

## 2014-01-29 ENCOUNTER — Ambulatory Visit (HOSPITAL_COMMUNITY)
Admission: RE | Admit: 2014-01-29 | Discharge: 2014-01-29 | Disposition: A | Discharge status: Home / Self Care | Payer: BC Managed Care – PPO | Source: Ambulatory Visit | Attending: Cardiology | Admitting: Cardiology

## 2014-01-29 VITALS — BP 128/84 | HR 63 | Ht 65.0 in | Wt 212.0 lb

## 2014-01-29 DIAGNOSIS — E785 Hyperlipidemia, unspecified: Secondary | ICD-10-CM | POA: Diagnosis not present

## 2014-01-29 DIAGNOSIS — I5031 Acute diastolic (congestive) heart failure: Secondary | ICD-10-CM | POA: Diagnosis not present

## 2014-01-29 DIAGNOSIS — R1011 Right upper quadrant pain: Secondary | ICD-10-CM | POA: Diagnosis not present

## 2014-01-29 DIAGNOSIS — Z7901 Long term (current) use of anticoagulants: Secondary | ICD-10-CM | POA: Diagnosis not present

## 2014-01-29 DIAGNOSIS — I480 Paroxysmal atrial fibrillation: Secondary | ICD-10-CM | POA: Diagnosis not present

## 2014-01-29 DIAGNOSIS — E119 Type 2 diabetes mellitus without complications: Secondary | ICD-10-CM | POA: Diagnosis not present

## 2014-01-29 DIAGNOSIS — I350 Nonrheumatic aortic (valve) stenosis: Secondary | ICD-10-CM | POA: Diagnosis not present

## 2014-01-29 DIAGNOSIS — R109 Unspecified abdominal pain: Secondary | ICD-10-CM | POA: Insufficient documentation

## 2014-01-29 DIAGNOSIS — I4891 Unspecified atrial fibrillation: Secondary | ICD-10-CM

## 2014-01-29 MED ORDER — DILTIAZEM HCL ER COATED BEADS 120 MG PO CP24: 120.0000 mg | ORAL_CAPSULE | Freq: Every day | ORAL | Status: DC

## 2014-01-29 NOTE — Progress Notes (Signed)
Patient ID: Vernon Ruiz, male   DOB: 05/21/1950, 63 y.o.   MRN: [id number] PCP: Dr. Shana ChuteSpruill  63 yo with history of diabetes, hyperlipidemia, and recently noted atrial fibrillation and severe aortic stenosis presents for cardiology evaluation.  Patient had no known cardiac history prior to 10/15.  Earlier this month, he was driving to [place] for his daughter's wedding.  In [place], he developed chest pain and dyspnea.  He went to the ER in [place] and was noted to be in atrial fibrillation with RVR.  CXR showed CHF.  He was admitted and rate controlled.  Troponin was negative.  Cardiolite was done and showed no evidence for ischemia or infarction.  Echo showed normal EF but there was severe AS with mean gradient 41 mmHg.    Since that time, he has gone back into NSR.  Today, he remains in NSR.  He is on apixaban.  He has had no further chest pain.  He has shortness of breath carrying a load up a flight of stairs, but otherwise he does not have significant dyspnea.  This has been stable for years.  No lightheadedness or syncope.  He has had some fatigue since the atrial fibrillation episode which he attributes to the diltiazem.  Main complaint is epigastric abdominal discomfort that tends to occur after eating.  Zantac helps some.   ECG: NSR, diffuse T wave flattening  Labs (10/15): hgb 16.1, plts 235, creatinine 1.1, TSH normal, LDL 113, TnI negative x 3  PMH: 1. Type II diabetes 2. Hyperlipidemia: Myalgias with atorvastatin.  3. Atrial fibrillation: Paroxysmal.  Initial episode in 10/15 in [place].  Presented with afib/RVR and acute diastolic CHF.  - Lexiscan Cardiolite (10/15): normal EF, no evidence for ischemia or infarction.  4. Aortic stenosis: Echo (10/15) with EF 60-65%, moderate LVH, normal RV size and systolic function, severe AS with mean gradient 41 mmHg/peak gradient 68 mmHg, PA systolic pressure 27 mmHg, mild MR.   SH: [place], married, 3 daughters, lives in [place], smokes  an occasional cigar.   FH: No heart disease that he knows of.   ROS: All systems reviewed and negative except as per HPI.   Current Outpatient Prescriptions  Medication Sig Dispense Refill  . apixaban (ELIQUIS) 5 MG TABS tablet Take 5 mg by mouth 2 (two) times daily.      Marland Kitchen. ezetimibe (ZETIA) 10 MG tablet Take 10 mg by mouth daily.      Marland Kitchen. glimepiride (AMARYL) 1 MG tablet Take 3 mg by mouth 2 (two) times daily.      . sitaGLIPtin-metformin (JANUMET) 50-1000 MG per tablet Take 1 tablet by mouth 2 (two) times daily with a meal.      . diltiazem (CARDIZEM CD) 120 MG 24 hr capsule Take 1 capsule (120 mg total) by mouth daily.  30 capsule  6   No current facility-administered medications for this encounter.    BP 128/84  Pulse 63  Ht 5\' 5"  (1.651 m)  Wt 212 lb (96.163 kg)  BMI 35.28 kg/m2  SpO2 97% General: NAD Neck: No JVD, no thyromegaly or thyroid nodule.  Lungs: Clear to auscultation bilaterally with normal respiratory effort. CV: Nondisplaced PMI.  Heart regular S1/S2, no S3/S4, 3/6 systolic crescendo-decrescendo murmur RUSB with some obscuring of S2.  No peripheral edema.  No carotid bruit.  Normal pedal pulses.  Abdomen: Soft, nontender, no hepatosplenomegaly, no distention.  Skin: Intact without lesions or rashes.  Neurologic: Alert and oriented x 3.  Psych: Normal affect. Extremities: No clubbing or cyanosis.  HEENT: Normal.   Assessment/Plan: 1. Atrial fibrillation: Paroxysmal, now back in NSR.  Severe aortic stenosis is the likely precipitant. CHADSVASC = 2 (Diabetes, CHF during afib/RVR event).   - Continue apixaban 5 mg bid. - Can stop diltiazem short-acting formulation and start on diltiazem CD 120 mg daily.  2. Aortic stenosis: Severe by echo in [place], [place].  EF preserved.  Currently, minimally symptomatic by history.  Suspect he is predisposed to atrial fibrillation by aortic stenosis.  - Repeat limited echo now that he is back in NSR to confirm severe AS and assess for  bicuspid valve.  - If AS is truly severe, will have him do an ETT to get objective measurement of exercise capacity. I suspect he will need AVR in the near future.  3. Abdominal discomfort: Post-prandial.  I will get an RUQ [place] to look for gallstones and will also ask him to take a PPI daily to see if this helps.   Marca Ancona 01/29/2014

## 2014-01-29 NOTE — Patient Instructions (Signed)
Follow up in 2 weeks with Dr Shirlee Latch  Take cardizem CD 120 mg daily   Stop Diltiazem    Echocardiogram (Limited)  Abdominal ultrasound

## 2014-01-31 NOTE — Addendum Note (Signed)
Encounter addended by: Deitra Mayo, CCT on: 01/31/2014  9:56 AM<BR>     Documentation filed: Charges VN

## 2014-02-14 ENCOUNTER — Encounter (HOSPITAL_COMMUNITY): Payer: Self-pay

## 2014-02-14 ENCOUNTER — Ambulatory Visit (HOSPITAL_COMMUNITY): Admission: RE | Admit: 2014-02-14 | Payer: BC Managed Care – PPO | Source: Ambulatory Visit

## 2014-02-14 ENCOUNTER — Ambulatory Visit (HOSPITAL_BASED_OUTPATIENT_CLINIC_OR_DEPARTMENT_OTHER)
Admission: RE | Admit: 2014-02-14 | Discharge: 2014-02-14 | Disposition: A | Discharge status: Home / Self Care | Payer: BC Managed Care – PPO | Source: Ambulatory Visit | Attending: Cardiology | Admitting: Cardiology

## 2014-02-14 ENCOUNTER — Ambulatory Visit (HOSPITAL_COMMUNITY)
Admission: RE | Admit: 2014-02-14 | Discharge: 2014-02-14 | Disposition: A | Discharge status: Home / Self Care | Payer: BC Managed Care – PPO | Source: Ambulatory Visit | Attending: Cardiology | Admitting: Cardiology

## 2014-02-14 VITALS — BP 130/78 | HR 68 | Wt 214.8 lb

## 2014-02-14 DIAGNOSIS — R1011 Right upper quadrant pain: Secondary | ICD-10-CM

## 2014-02-14 DIAGNOSIS — I5189 Other ill-defined heart diseases: Secondary | ICD-10-CM | POA: Diagnosis not present

## 2014-02-14 DIAGNOSIS — I350 Nonrheumatic aortic (valve) stenosis: Secondary | ICD-10-CM | POA: Diagnosis not present

## 2014-02-14 DIAGNOSIS — Z79899 Other long term (current) drug therapy: Secondary | ICD-10-CM | POA: Insufficient documentation

## 2014-02-14 DIAGNOSIS — E785 Hyperlipidemia, unspecified: Secondary | ICD-10-CM | POA: Diagnosis not present

## 2014-02-14 DIAGNOSIS — E119 Type 2 diabetes mellitus without complications: Secondary | ICD-10-CM | POA: Insufficient documentation

## 2014-02-14 DIAGNOSIS — I517 Cardiomegaly: Secondary | ICD-10-CM | POA: Diagnosis not present

## 2014-02-14 DIAGNOSIS — I480 Paroxysmal atrial fibrillation: Secondary | ICD-10-CM | POA: Diagnosis not present

## 2014-02-14 DIAGNOSIS — I358 Other nonrheumatic aortic valve disorders: Secondary | ICD-10-CM | POA: Insufficient documentation

## 2014-02-14 DIAGNOSIS — I4891 Unspecified atrial fibrillation: Secondary | ICD-10-CM | POA: Diagnosis present

## 2014-02-14 DIAGNOSIS — R109 Unspecified abdominal pain: Secondary | ICD-10-CM | POA: Diagnosis not present

## 2014-02-14 DIAGNOSIS — I359 Nonrheumatic aortic valve disorder, unspecified: Secondary | ICD-10-CM

## 2014-02-14 NOTE — Patient Instructions (Signed)
RESCHEDULE ABDOMINAL ULTRASOUND  Labs in 6 months (bmet, cbc)  We will contact you in 6 months to schedule your next appointment.  Your physician has requested that you have an echocardiogram. Echocardiography is a painless test that uses sound waves to create images of your heart. It provides your doctor with information about the size and shape of your heart and how well your heart's chambers and valves are working. This procedure takes approximately one hour. There are no restrictions for this procedure.  IN 1 YEAR

## 2014-02-14 NOTE — Progress Notes (Signed)
*  PRELIMINARY RESULTS* Echocardiogram 2D Echocardiogram has been performed.  Jeryl Columbia 02/14/2014, 10:47 AM

## 2014-02-15 NOTE — Progress Notes (Signed)
Patient ID: Vernon Ruiz, male   DOB: [birth year], 63 y.o.   MRN: [id number] PCP: Dr. Shana Ruiz  63 y.o. with history of diabetes, hyperlipidemia, and recently noted atrial fibrillation and aortic stenosis presents for cardiology evaluation.  Patient had no known cardiac history prior to 10/15.  Earlier this month, he was driving to [place] for his daughter's wedding.  In [place], he developed chest pain and dyspnea.  He went to the ER in [place], [place] and was noted to be in atrial fibrillation with RVR.  CXR showed CHF.  He was admitted and rate controlled.  Troponin was negative.  Cardiolite was done and showed no evidence for ischemia or infarction.  Echo showed normal EF but there was severe AS with mean gradient 41 mmHg (in setting of atrial fibrillation).    Since that time, he has gone back into NSR.  Today, he remains in NSR.  He is on apixaban.  He has had no further chest pain.  He has shortness of breath carrying a load up a flight of stairs, but otherwise he does not have significant dyspnea.  This has been stable for years.  No lightheadedness or syncope.  Overall, he feels good except for ongoing epigastric abdominal discomfort that tends to occur after eating.  PPI helps some.  He missed his appointment for abdominal US, will reschedule.   I had him do an echo today while in NSR.  I reviewed.  EF is normal, aortic stenosis is moderate.   Labs (10/15): hgb 16.1, plts 235, creatinine 1.1, TSH normal, LDL 113, TnI negative x 3  PMH: 1. Type II diabetes 2. Hyperlipidemia: Myalgias with atorvastatin.  3. Atrial fibrillation: Paroxysmal.  Initial episode in 10/15 in [place], [place].  Presented with afib/RVR and acute diastolic CHF.  - Lexiscan Cardiolite (10/15): normal EF, no evidence for ischemia or infarction.  4. Aortic stenosis: Echo (10/15) while in aortic stenosis with EF 60-65%, moderate LVH, normal RV size and systolic function, severe AS with mean gradient 41 mmHg/peak gradient 68 mmHg, PA  systolic pressure 27 mmHg, mild MR.  Echo (11/15) with EF 60-65%, mild LVH, grade II diastolic dysfunction, trileaflet aortic valve with moderate AS with mean gradient 28 mmHg and AVA 1.3 cm2, mildly dilated RV with normal RV systolic function.   SH: Vernon Ruiz, married, 3 daughters, lives in [place], smokes an occasional cigar.   FH: No heart disease that he knows of.   ROS: All systems reviewed and negative except as per HPI.   Current Outpatient Prescriptions  Medication Sig Dispense Refill  . apixaban (ELIQUIS) 5 MG TABS tablet Take 5 mg by mouth 2 (two) times daily.    Marland Kitchen diltiazem (CARDIZEM CD) 120 MG 24 hr capsule Take 1 capsule (120 mg total) by mouth daily. 30 capsule 6  . ezetimibe (ZETIA) 10 MG tablet Take 10 mg by mouth daily.    Marland Kitchen glimepiride (AMARYL) 1 MG tablet Take 3 mg by mouth 2 (two) times daily.    . sitaGLIPtin-metformin (JANUMET) 50-1000 MG per tablet Take 1 tablet by mouth 2 (two) times daily with a meal.     No current facility-administered medications for this encounter.    BP 130/78 mmHg  Pulse 68  Wt 214 lb 12.8 oz (97.433 kg)  SpO2 96% General: NAD Neck: No JVD, no thyromegaly or thyroid nodule.  Lungs: Clear to auscultation bilaterally with normal respiratory effort. CV: Nondisplaced PMI.  Heart regular S1/S2, no S3/S4, 3/6 systolic crescendo-decrescendo murmur RUSB with  clear S2.  No peripheral edema.  No carotid bruit.  Normal pedal pulses.  Abdomen: Soft, nontender, no hepatosplenomegaly, no distention.  Skin: Intact without lesions or rashes.  Neurologic: Alert and oriented x 3.  Psych: Normal affect. Extremities: No clubbing or cyanosis.  HEENT: Normal.   Assessment/Plan: 1. Atrial fibrillation: Paroxysmal, now back in NSR.  CHADSVASC = 2 (Diabetes, CHF during afib/RVR event).   - Continue apixaban 5 mg bid.  BMET/CBC in 6 months.  - Continue diltiazem CD 120 mg daily.   2. Aortic stenosis: [place] but echo was done while in  atrial fibrillation.  Currently, minimally symptomatic by history.  I reviewed today's echo.  The aortic valve is trileaflet and aortic stenosis is moderate.  The higher gradient reading in [place] may be due to echo measurements taken while in atrial fibrillation.  If the highest (and not average) gradient was measured while in atrial fibrillation, aortic stenosis degree could have been over-estimated.  - I will have him followup in 6 months and get an echo in 1 year.  3. Abdominal discomfort: Post-prandial.  Will reschedule RUQ US.   Vernon Ruiz 02/15/2014

## 2014-02-20 ENCOUNTER — Ambulatory Visit (HOSPITAL_COMMUNITY)
Admission: RE | Admit: 2014-02-20 | Discharge: 2014-02-20 | Disposition: A | Discharge status: Home / Self Care | Payer: BC Managed Care – PPO | Source: Ambulatory Visit | Attending: Adult Health | Admitting: Adult Health

## 2014-02-20 DIAGNOSIS — I4891 Unspecified atrial fibrillation: Secondary | ICD-10-CM | POA: Diagnosis present

## 2014-02-20 DIAGNOSIS — R1011 Right upper quadrant pain: Secondary | ICD-10-CM | POA: Diagnosis present

## 2014-02-20 DIAGNOSIS — K760 Fatty (change of) liver, not elsewhere classified: Secondary | ICD-10-CM | POA: Insufficient documentation

## 2014-02-20 IMAGING — US US ABDOMEN COMPLETE
1 series · 14 of 25 positions shown · non-contrast
Comparison: None.

CLINICAL DATA: Atrial fibrillation.  Right upper quadrant pain.

EXAM:
ULTRASOUND ABDOMEN COMPLETE

[Series 1: us abdomen complete · 0.27mm/px · 14 of 74 slices shown]
[im 1/74]
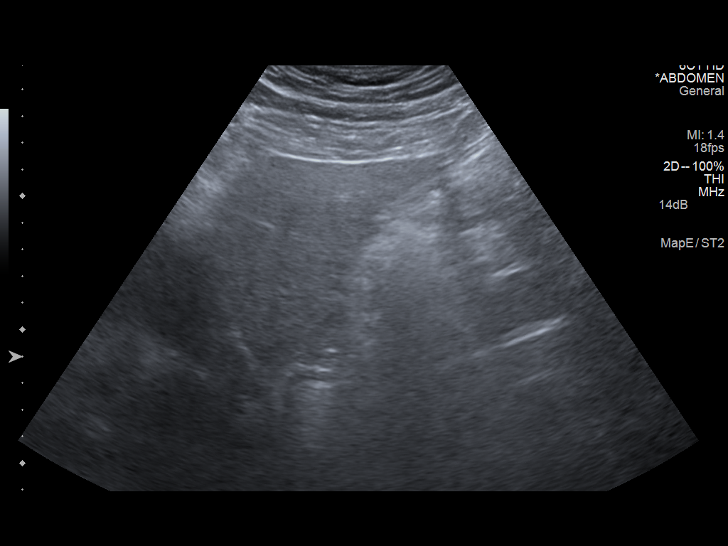
[im 7/74]
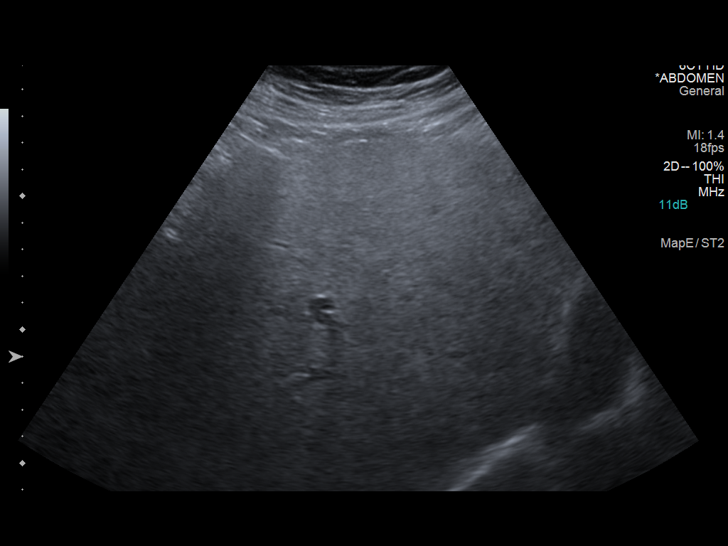
[im 13/74]
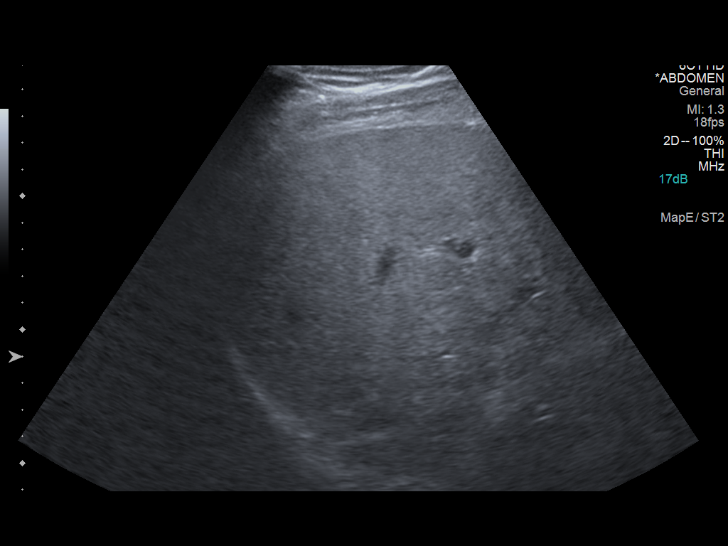
[im 19/74]
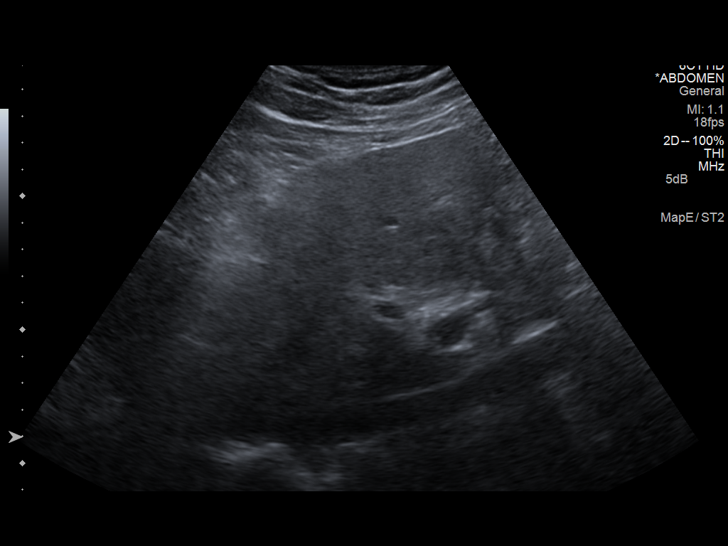
[im 25/74]
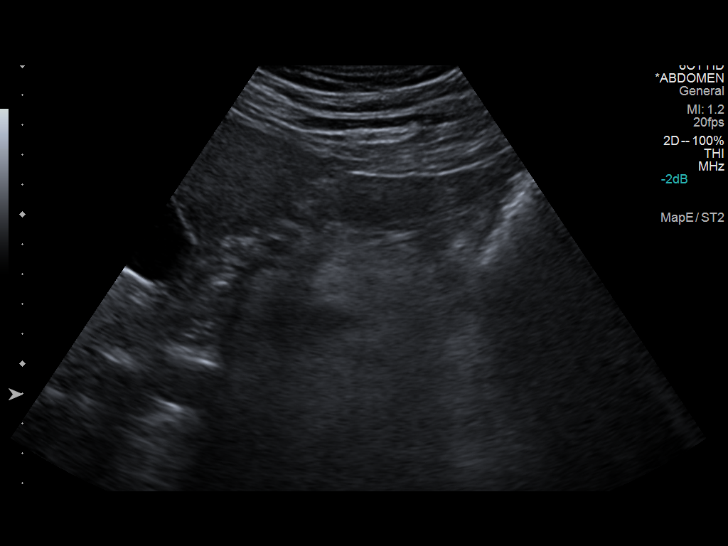
[im 28/74]
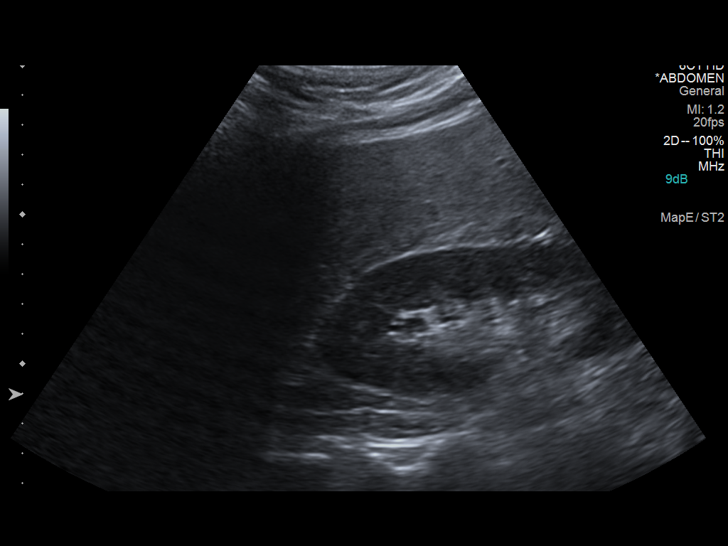
[im 34/74]
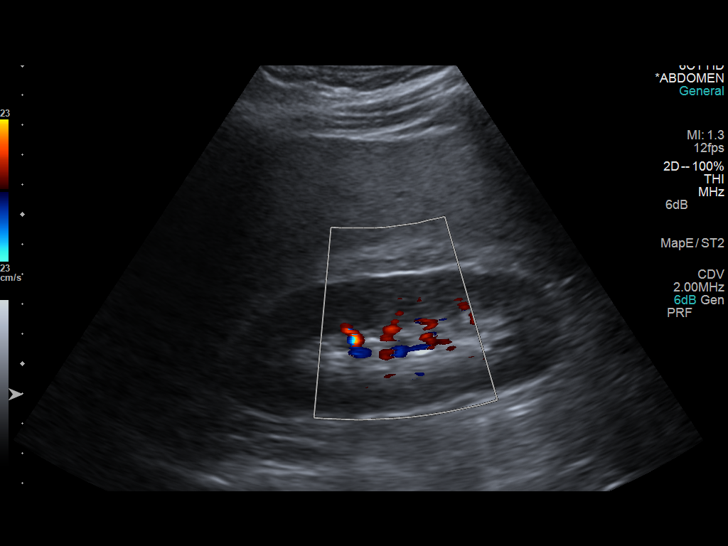
[im 40/74]
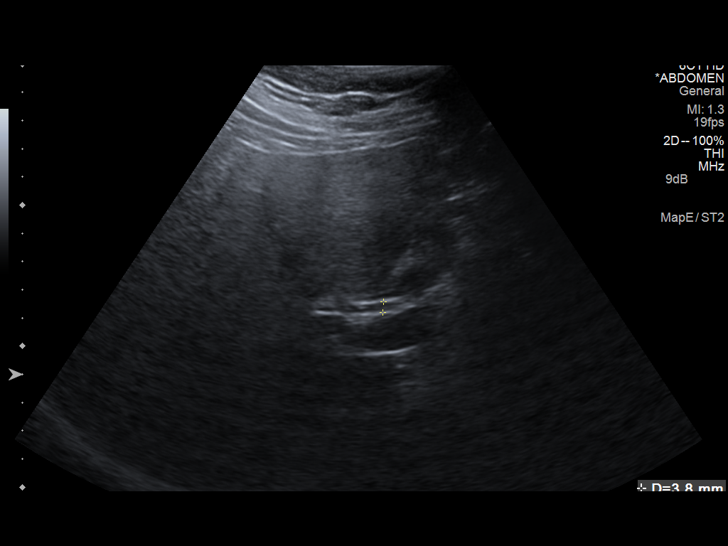
[im 46/74]
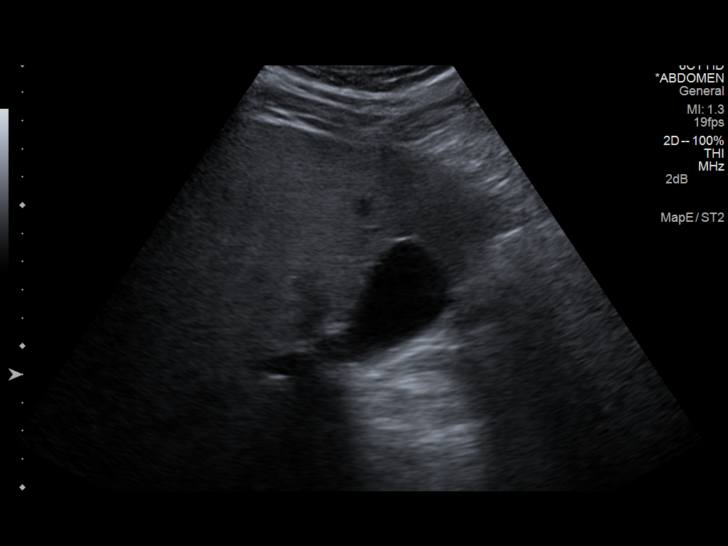
[im 49/74]
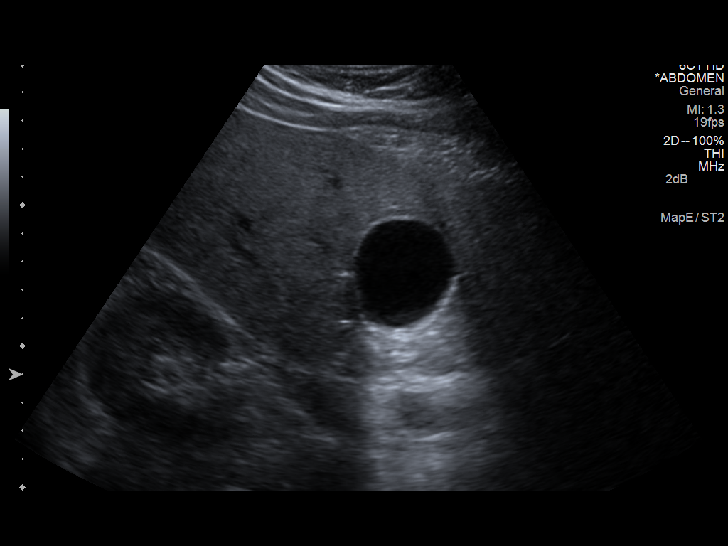
[im 55/74]
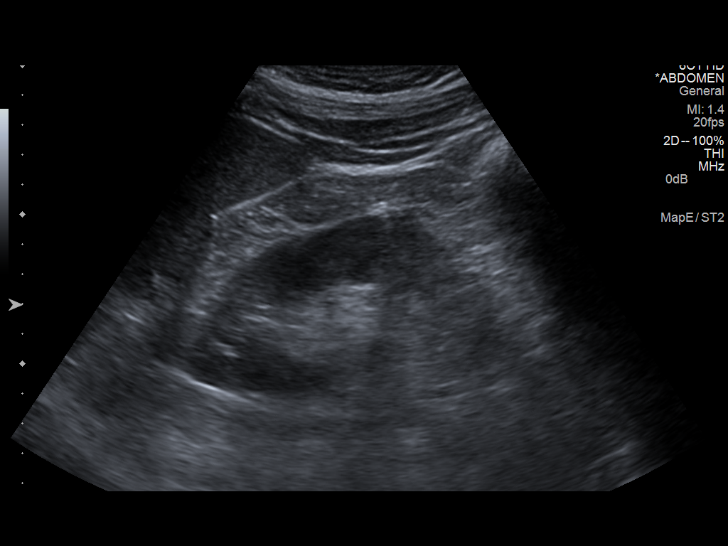
[im 61/74]
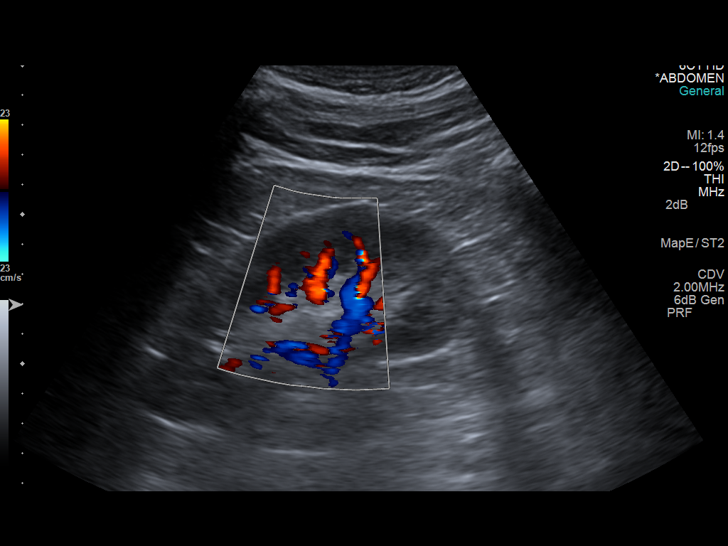
[im 67/74]
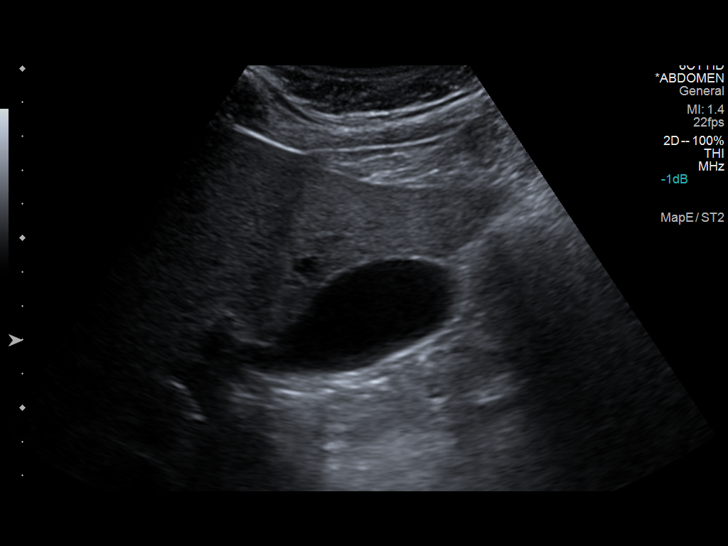
[im 74/74]
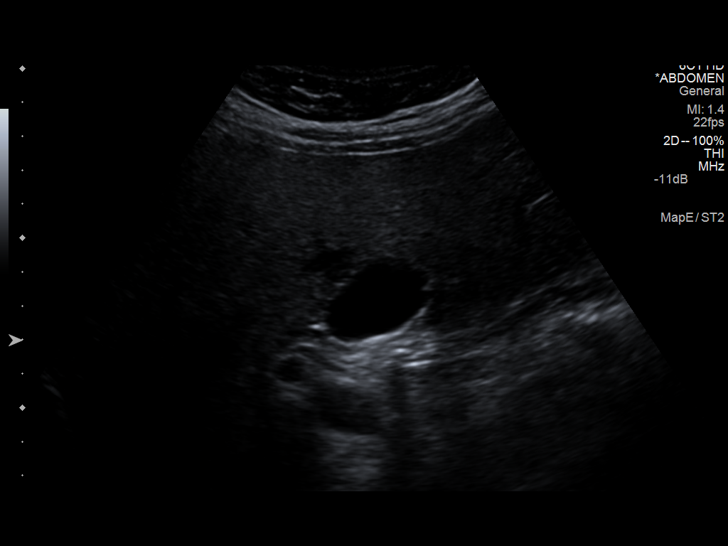

[14 of 25 positions shown; findings below may reference images not displayed]

FINDINGS: Gallbladder: No gallstones or wall thickening visualized. No
sonographic Murphy sign noted.

Common bile duct: Diameter: 3.8 mm

Liver: Liver is echogenic consistent fatty infiltration and/or
hepatocellular disease.

IVC: No abnormality visualized.

Pancreas: Visualized portion unremarkable.

Spleen: Size and appearance within normal limits.

Right Kidney: Length: 12.4 cm. Echogenicity within normal limits. No
mass or hydronephrosis visualized.

Left Kidney: Length: Felt 0.9 cm. Echogenicity within normal limits.
No mass or hydronephrosis visualized.

Abdominal aorta: No aneurysm visualized.

Other findings: None.
IMPRESSION: Diffusely echogenic liver consistent with fatty infiltration and
hepatocellular disease. Exam otherwise unremarkable.

## 2014-02-21 ENCOUNTER — Ambulatory Visit: Payer: BC Managed Care – PPO | Admitting: Cardiovascular Disease

## 2014-08-02 ENCOUNTER — Other Ambulatory Visit (HOSPITAL_COMMUNITY): Payer: Self-pay | Admitting: Cardiology

## 2014-11-30 ENCOUNTER — Other Ambulatory Visit (HOSPITAL_COMMUNITY): Payer: Self-pay | Admitting: Internal Medicine

## 2014-12-27 ENCOUNTER — Other Ambulatory Visit (HOSPITAL_COMMUNITY): Payer: Self-pay | Admitting: Internal Medicine

## 2015-02-12 ENCOUNTER — Other Ambulatory Visit (HOSPITAL_COMMUNITY): Payer: Self-pay | Admitting: Internal Medicine

## 2015-11-19 ENCOUNTER — Emergency Department (HOSPITAL_COMMUNITY): Payer: BLUE CROSS/BLUE SHIELD

## 2015-11-19 ENCOUNTER — Encounter (HOSPITAL_COMMUNITY): Payer: Self-pay

## 2015-11-19 ENCOUNTER — Emergency Department (HOSPITAL_COMMUNITY)
Admission: EM | Admit: 2015-11-19 | Discharge: 2015-11-19 | Disposition: A | Discharge status: Home / Self Care | Payer: BLUE CROSS/BLUE SHIELD | Attending: Emergency Medicine | Admitting: Emergency Medicine

## 2015-11-19 DIAGNOSIS — E119 Type 2 diabetes mellitus without complications: Secondary | ICD-10-CM | POA: Insufficient documentation

## 2015-11-19 DIAGNOSIS — Z7901 Long term (current) use of anticoagulants: Secondary | ICD-10-CM | POA: Diagnosis not present

## 2015-11-19 DIAGNOSIS — R29810 Facial weakness: Secondary | ICD-10-CM | POA: Diagnosis present

## 2015-11-19 DIAGNOSIS — G510 Bell's palsy: Secondary | ICD-10-CM | POA: Diagnosis not present

## 2015-11-19 HISTORY — DX: Type 2 diabetes mellitus without complications: E11.9

## 2015-11-19 IMAGING — CT CT HEAD W/O CM
3 of 4 series · 18 of 47 positions shown, 21 images · non-contrast
Comparison: None.

CLINICAL DATA: Left-sided facial droop and left-sided weakness.

EXAM:
CT HEAD WITHOUT CONTRAST
TECHNIQUE: Contiguous axial images were obtained from the base of the skull
through the vertex without intravenous contrast.

[Series 201: head w/o, idose (1) · axial · non-contrast · 0.43mm/px · z∈[+71,+196]mm · 12 of 31 slices shown, 15 images]
[im 3/31  brain]
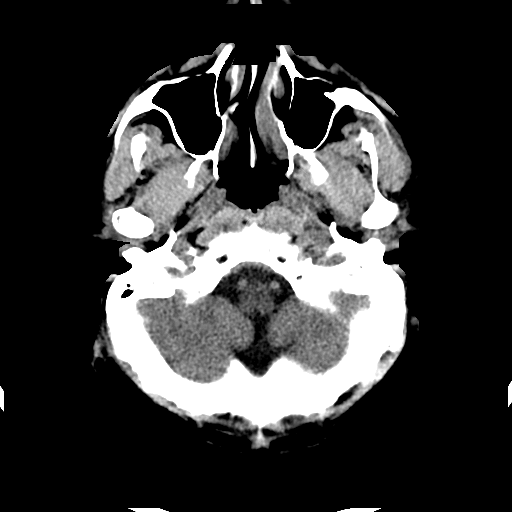
[im 3/31  bone]
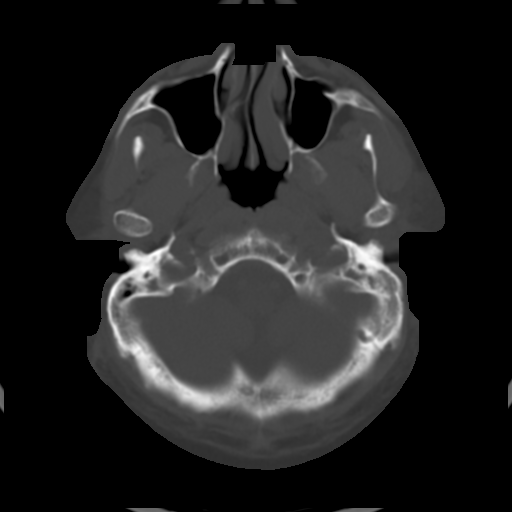
[im 5/31  brain]
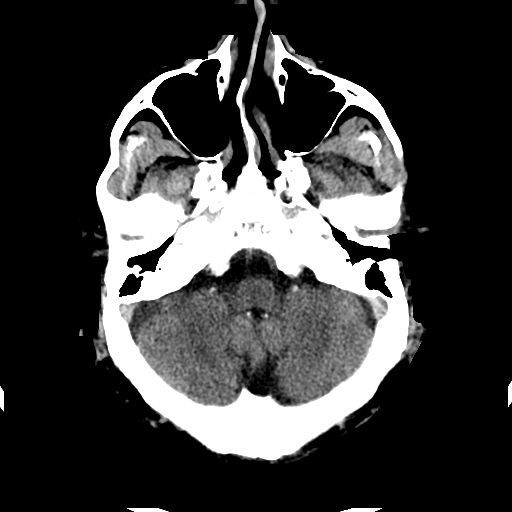
[im 7/31  brain]
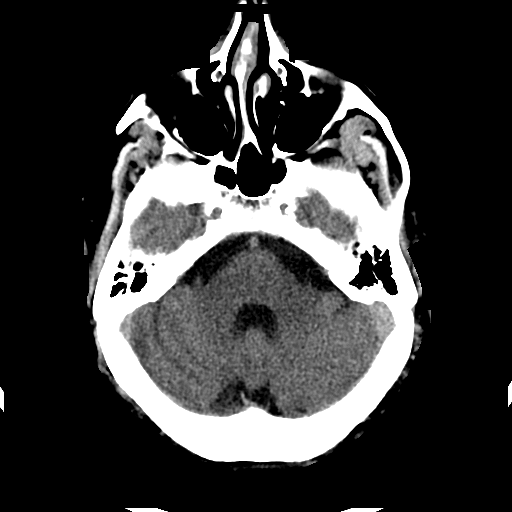
[im 9/31  brain]
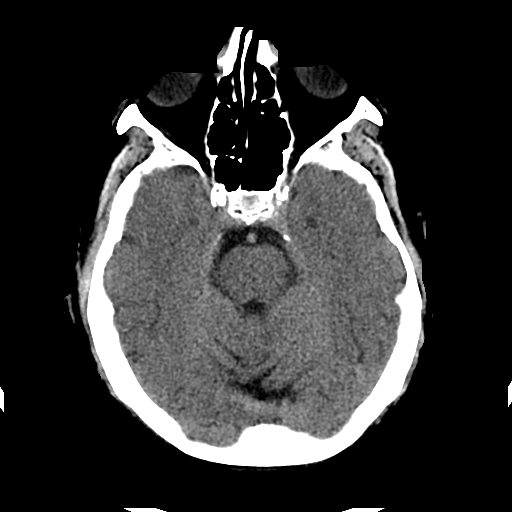
[im 11/31  brain]
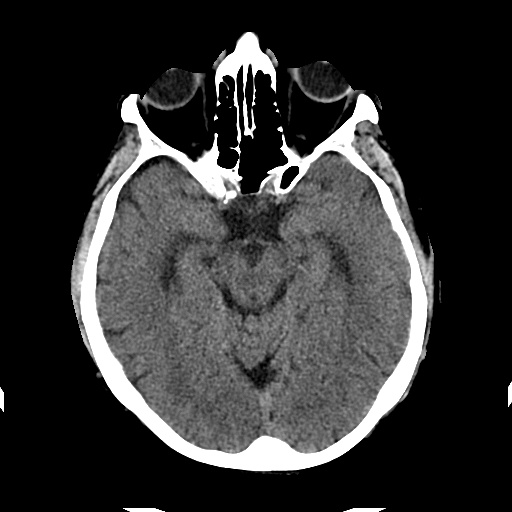
[im 11/31  bone]
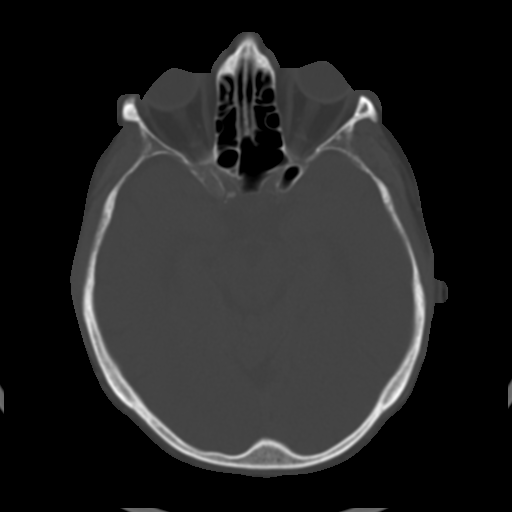
[im 13/31  brain]
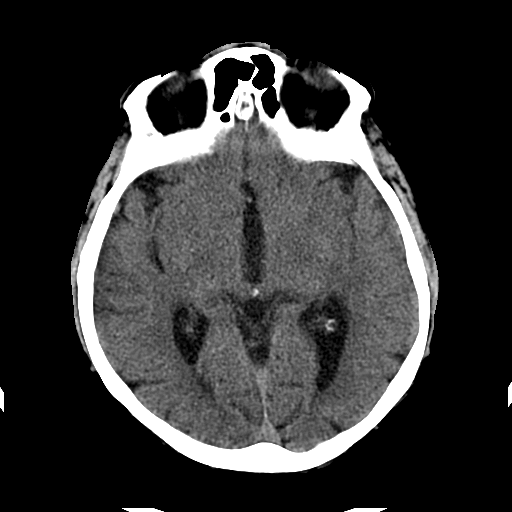
[im 18/31  brain]
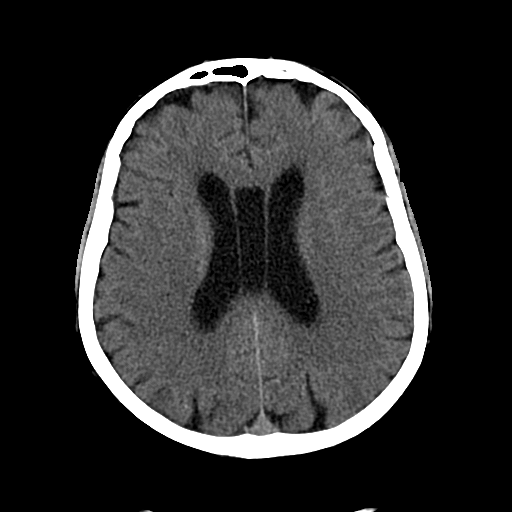
[im 20/31  brain]
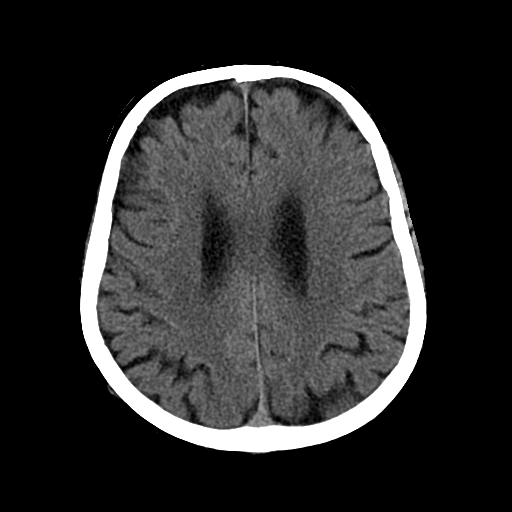
[im 22/31  brain]
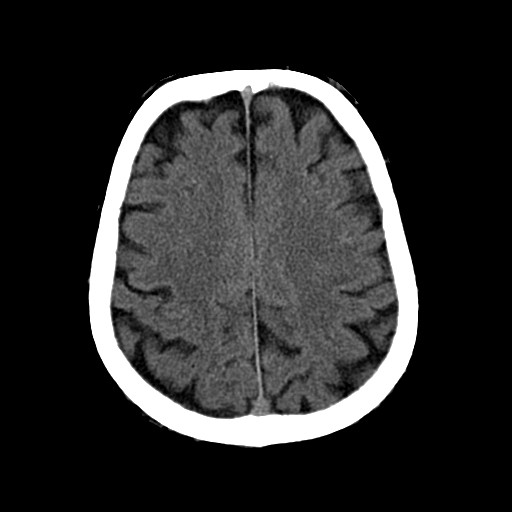
[im 22/31  bone]
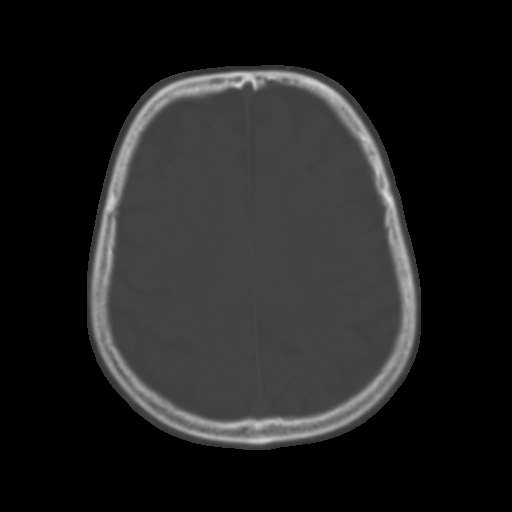
[im 24/31  brain]
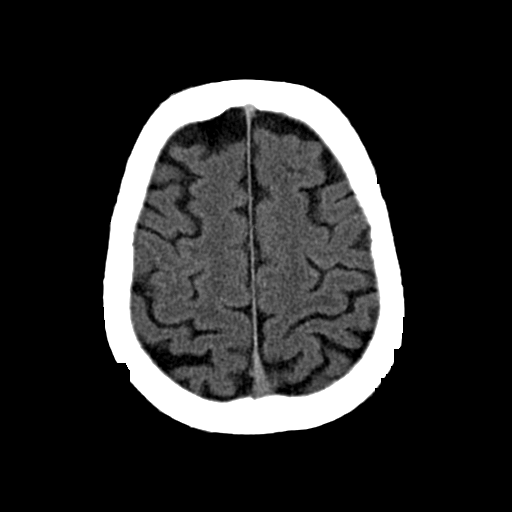
[im 26/31  brain]
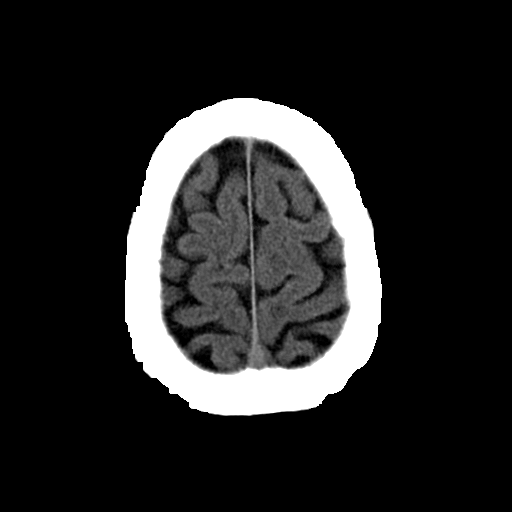
[im 28/31  brain]
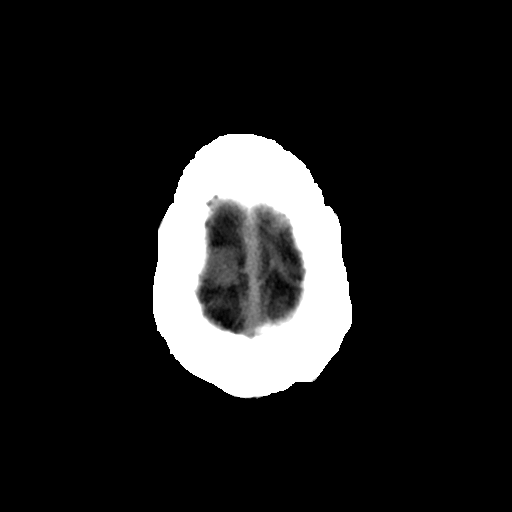

[Series 203: coronal st, idose (1) · coronal · 0.40mm/px · 3 of 73 slices shown]
[im 25/73  brain]
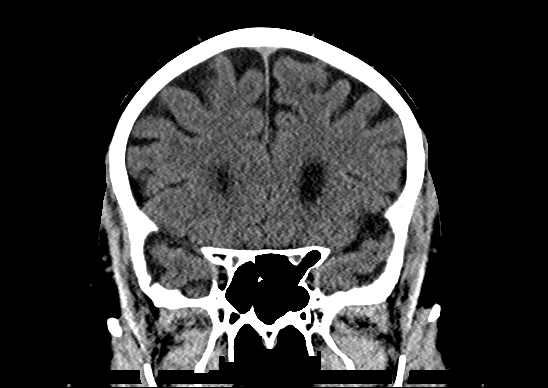
[im 33/73  brain]
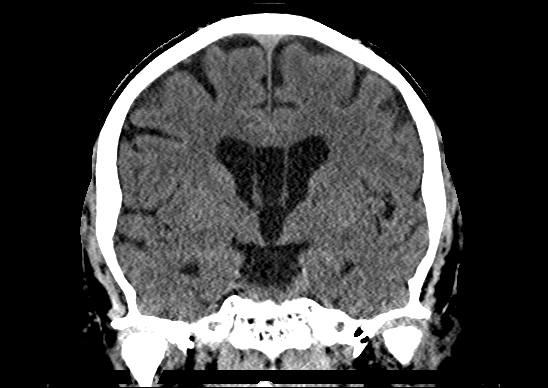
[im 41/73  brain]
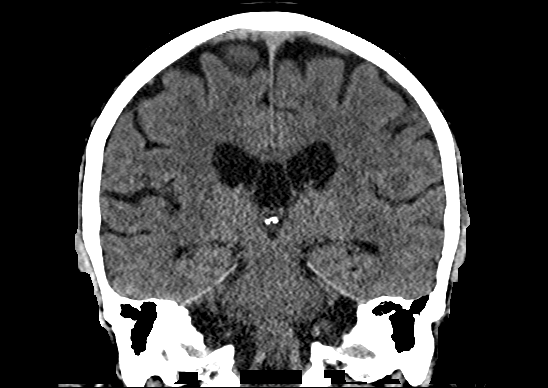

[Series 204: sagittal st, idose (1) · sagittal · 0.40mm/px · 3 of 73 slices shown]
[im 25/73  brain]
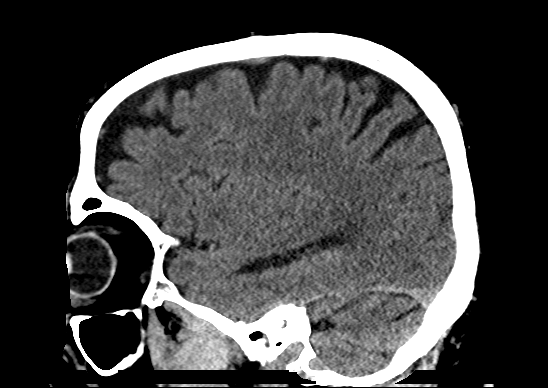
[im 37/73  brain]
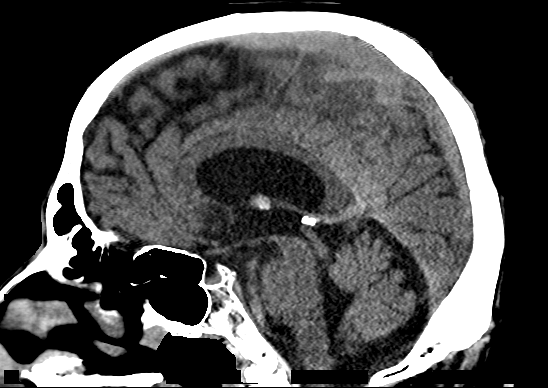
[im 49/73  brain]
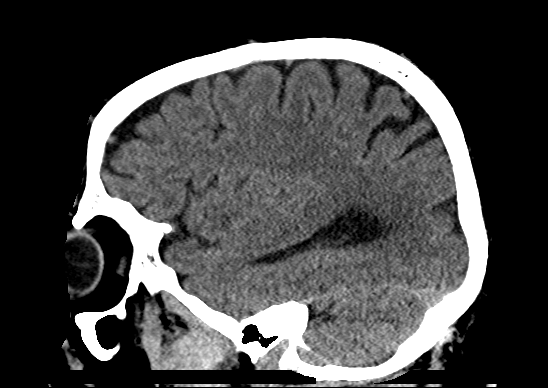

[18 of 47 positions shown; findings below may reference images not displayed]

FINDINGS: The brain demonstrates no evidence of hemorrhage, infarction, edema,
mass effect, extra-axial fluid collection, hydrocephalus or mass
lesion. Incidental cavum septum pellucidum. The skull is
unremarkable.
IMPRESSION: No acute findings by head CT.

## 2015-11-19 MED ORDER — PREDNISONE 20 MG PO TABS: 40.0000 mg | ORAL_TABLET | Freq: Every day | ORAL | 0 refills | Status: DC

## 2015-11-19 MED ORDER — VALACYCLOVIR HCL 1 G PO TABS: 1000.0000 mg | ORAL_TABLET | Freq: Three times a day (TID) | ORAL | 0 refills | Status: DC

## 2015-11-19 NOTE — ED Triage Notes (Signed)
Per Pt, Pt is coming from home with left sided facial droop that started yesterday night around 1900. Pt reports "feeling weak in the left side of the face" and being unable to fully swallow.

## 2015-11-19 NOTE — ED Notes (Signed)
Patient transported to CT 

## 2015-11-19 NOTE — ED Provider Notes (Signed)
[facility]-EMERGENCY DEPT Provider Note   CSN: [id number] Arrival date & time: 11/19/15  0907  First Provider Contact:  First MD Initiated Contact with Patient 11/19/15 0915        History   Chief Complaint Chief Complaint  Patient presents with  . Facial Droop    HPI Vernon Ruiz is a 65 y.o. male.  HPI Patient presents with new left-sided facial droop which he noticed last night.  It is more pronounced this morning thus he was encouraged to come to the emergency department by his family members.  He denies weakness of his arms or legs.  He denies other neurologic symptoms.  He reports no significant tearing from his left eye.  He's never had these symptoms before.  He does have a history of paroxysmal A. fib and is on the Eliquis, but reports no headache or recent head injury.  His symptoms are mild to moderate in severity.  No prior history of stroke but he does have diabetes.    Past Medical History:  Diagnosis Date  . Diabetes mellitus without complication [facility])     Patient Active Problem List   Diagnosis Date Noted  . Aortic stenosis 01/29/2014  . Paroxysmal atrial fibrillation (HCC) 01/29/2014  . Abdominal pain 01/29/2014    Past Surgical History:  Procedure Laterality Date  . APPENDECTOMY    . HERNIA REPAIR         Home Medications    Prior to Admission medications   Medication Sig Start Date End Date Taking? Authorizing Provider  apixaban (ELIQUIS) 5 MG TABS tablet Take 5 mg by mouth 2 (two) times daily.    Historical Provider, MD  CARTIA XT 120 MG 24 hr capsule TAKE 1 CAPSULE BY MOUTH EVERY DAY 02/12/15   Dolores Patty, MD  ezetimibe (ZETIA) 10 MG tablet Take 10 mg by mouth daily.    Historical Provider, MD  glimepiride (AMARYL) 1 MG tablet Take 3 mg by mouth 2 (two) times daily.    Historical Provider, MD  sitaGLIPtin-metformin (JANUMET) 50-1000 MG per tablet Take 1 tablet by mouth 2 (two) times daily with a meal.    Historical Provider, MD    Family  History No family history on file.  Social History Social History  Substance Use Topics  . Smoking status: Never Smoker  . Smokeless tobacco: Never Used  . Alcohol use No     Allergies   Penicillins   Review of Systems Review of Systems  All other systems reviewed and are negative.    Physical Exam Updated Vital Signs BP 154/69   Pulse (!) 57   Temp 97.4 F (36.3 C) (Oral)   Resp 16   Ht  (1.651 m)   Wt 221 lb (100.2 kg)   SpO2 96%   BMI 36.78 kg/m   Physical Exam  Constitutional: He is oriented to person, place, and time. He appears well-developed and well-nourished.  HENT:  Head: Normocephalic and atraumatic.  Eyes: EOM are normal.  Neck: Normal range of motion.  Cardiovascular: Normal rate, regular rhythm and normal heart sounds.   Pulmonary/Chest: Effort normal and breath sounds normal. No respiratory distress.  Abdominal: Soft. He exhibits no distension. There is no tenderness.  Musculoskeletal: Normal range of motion.  Neurological: He is alert and oriented to person, place, and time.  5 out of 5 strength of bilateral upper lower extremity major muscle groups.  Left-sided facial weakness with weakness of the left forehead muscles.  Skin: Skin  is warm and dry.  Psychiatric: He has a normal mood and affect. Judgment normal.  Nursing note and vitals reviewed.    ED Treatments / Results  Labs (all labs ordered are listed, but only abnormal results are displayed) Labs Reviewed - No data to display  EKG  EKG Interpretation None       Radiology Ct Head Wo Contrast  Result Date: 11/19/2015 CLINICAL DATA:  Left-sided facial droop and left-sided weakness. EXAM: CT HEAD WITHOUT CONTRAST TECHNIQUE: Contiguous axial images were obtained from the base of the skull through the vertex without intravenous contrast. COMPARISON:  None. FINDINGS: The brain demonstrates no evidence of hemorrhage, infarction, edema, mass effect, extra-axial fluid collection,  hydrocephalus or mass lesion. Incidental cavum septum pellucidum. The skull is unremarkable. IMPRESSION: No acute findings by head CT. Electronically Signed   By: Irish Lack M.D.   On: 11/19/2015 10:44    Procedures Procedures (including critical care time)  Medications Ordered in ED Medications - No data to display   Initial Impression / Assessment and Plan / ED Course  I have reviewed the triage vital signs and the nursing notes.  Pertinent labs & imaging results that were available during my care of the patient were reviewed by me and considered in my medical decision making (see chart for details).  Clinical Course    Clinically symptoms consistent with Bell's palsy.  Given his use of L a quick's family is concerned about the possibility of a head bleed would feel better with the CT scan of his head.  This was performed and negative for acute bleed or abnormality.  I do not think the patient needs an MRI.  The patient will be discharged home with prednisone and valacyclovir.  He's been warned about the possibility of increasing blood sugars while on prednisone.  He will continue to follow closely and follow with his primary care physician.  Final Clinical Impressions(s) / ED Diagnoses   Final diagnoses:  Bell's palsy    New Prescriptions New Prescriptions   No medications on file     Azalia Bilis, MD 11/19/15 1054

## 2016-01-06 ENCOUNTER — Encounter: Payer: Self-pay | Admitting: Internal Medicine

## 2016-01-07 ENCOUNTER — Encounter: Payer: Self-pay | Admitting: Internal Medicine

## 2016-01-07 ENCOUNTER — Ambulatory Visit (INDEPENDENT_AMBULATORY_CARE_PROVIDER_SITE_OTHER): Payer: BLUE CROSS/BLUE SHIELD | Admitting: Internal Medicine

## 2016-01-07 ENCOUNTER — Other Ambulatory Visit: Payer: Self-pay

## 2016-01-07 ENCOUNTER — Encounter (INDEPENDENT_AMBULATORY_CARE_PROVIDER_SITE_OTHER): Payer: Self-pay

## 2016-01-07 VITALS — BP 130/70 | HR 67 | Ht 65.0 in | Wt 219.8 lb

## 2016-01-07 DIAGNOSIS — I350 Nonrheumatic aortic (valve) stenosis: Secondary | ICD-10-CM | POA: Diagnosis not present

## 2016-01-07 DIAGNOSIS — I480 Paroxysmal atrial fibrillation: Secondary | ICD-10-CM

## 2016-01-07 DIAGNOSIS — I10 Essential (primary) hypertension: Secondary | ICD-10-CM

## 2016-01-07 NOTE — Patient Instructions (Addendum)
Medication Instructions:  Your physician recommends that you continue on your current medications as directed. Please refer to the Current Medication list given to you today.   Labwork: None ordered   Testing/Procedures: Your physician has requested that you have an echocardiogram. Echocardiography is a painless test that uses sound waves to create images of your heart. It provides your doctor with information about the size and shape of your heart and how well your heart's chambers and valves are working. This procedure takes approximately one hour. There are no restrictions for this procedure.    Follow-Up: Your physician recommends that you schedule a follow-up appointment in: 4 weeks with Dr Allred   Any Other Special Instructions Will Be Listed Below (If Applicable).     If you need a refill on your cardiac medications before your next appointment, please call your pharmacy.   

## 2016-01-12 ENCOUNTER — Encounter: Payer: Self-pay | Admitting: Internal Medicine

## 2016-01-12 NOTE — Progress Notes (Signed)
Electrophysiology Office Note   Date:  01/12/2016   ID:  Vernon Ruiz, DOB Sep 14, 1950, MRN [id number]  PCP:  Vernon Corn, MD  Cardiologist:  Dr Sharyn Lull Primary Electrophysiologist: Vernon Range, MD    Chief Complaint  Patient presents with  . Atrial Fibrillation     History of Present Illness: Vernon Ruiz is a 65 y.o. male who presents today for electrophysiology evaluation.   He presents today for second opinion regarding his afib and aortic stenosis.  He wishes to take less medicines.  He is also concerned about chronic anticoagulation.  He has occasional SOB with moderate activity.   Today, he denies symptoms of palpitations, chest pain,  orthopnea, PND, lower extremity edema, claudication, dizziness, presyncope, syncope, bleeding, or neurologic sequela. The patient is tolerating medications without difficulties and is otherwise without complaint today.    Past Medical History:  Diagnosis Date  . Aortic stenosis   . Diabetes mellitus without complication (HCC)   . Hypertension   . Obstructive sleep apnea    noncompliant with CPAP  . Persistent atrial fibrillation [facility])    Past Surgical History:  Procedure Laterality Date  . APPENDECTOMY    . HERNIA REPAIR       Current Outpatient Prescriptions  Medication Sig Dispense Refill  . apixaban (ELIQUIS) 5 MG TABS tablet Take 5 mg by mouth 2 (two) times daily.    . B-D UF III MINI PEN NEEDLES 31G X 5 MM MISC U UTD D  2  . CARTIA XT 120 MG 24 hr capsule TAKE 1 CAPSULE BY MOUTH EVERY DAY 30 capsule 0  . Cyanocobalamin (VITAMIN B 12 PO) Take 1 tablet by mouth 2 (two) times a week.     . ezetimibe (ZETIA) 10 MG tablet Take 10 mg by mouth daily.    Marland Kitchen glimepiride (AMARYL) 4 MG tablet Take 2 mg by mouth 2 (two) times daily.    . Insulin Glargine (TOUJEO SOLOSTAR ) Inject 40 Units into the skin 2 (two) times daily. Pt taking 40 units once daily    . losartan (COZAAR) 100 MG tablet Take 50 mg by mouth daily.     .  sitaGLIPtin-metformin (JANUMET) 50-1000 MG per tablet Take 1 tablet by mouth 2 (two) times daily with a meal.     No current facility-administered medications for this visit.     Allergies:   Penicillins; Sulfa antibiotics; and Sulfamethoxazole   Social History:  The patient  reports that he has never smoked. He has never used smokeless tobacco. He reports that he does not drink alcohol or use drugs.   Family History:  The patient's FH notable for HTN   ROS:  Please see the history of present illness.   All other systems are reviewed and negative.    PHYSICAL EXAM: VS:  BP 130/70   Pulse 67   Ht 5\' 5"  (1.651 m)   Wt 219 lb 12.8 oz (99.7 kg)   BMI 36.58 kg/m  , BMI Body mass index is 36.58 kg/m. GEN: Well nourished, well developed, in no acute distress  HEENT: normal  Neck: no JVD, carotid bruits, or masses Cardiac: RRR; 3/6 SEM LUSB which is late peaking,no edema  Respiratory:  clear to auscultation bilaterally, normal work of breathing Ruiz: soft, nontender, nondistended, + BS MS: no deformity or atrophy  Skin: warm and dry  Neuro:  Strength and sensation are intact Psych: euthymic mood, full affect  EKG:  EKG is ordered today. The ekg ordered  today shows sinus rhythm   Recent Labs: No results found for requested labs within last 8760 hours.    Lipid Panel  No results found for: CHOL, TRIG, HDL, CHOLHDL, VLDL, LDLCALC, LDLDIRECT   Wt Readings from Last 3 Encounters:  01/07/16 219 lb 12.8 oz (99.7 kg)  11/19/15 221 lb (100.2 kg)  02/14/14 214 lb 12.8 oz (97.4 kg)      Other studies Reviewed: Additional studies/ records that were reviewed today include: echo from 04/03/15 (Dr Annitta JerseyHarwani's ofice)  Review of the above records today demonstrates: moderate AS, EF 55-60%, normal LA size   ASSESSMENT AND PLAN:  1.  Persistent afib Though history suggests persistent afib, ekgs reviewed from Dr Annitta JerseyHarwani's office and again today reveal mostly sinus rhythm.  Minimal  symptoms.  No indication for EP procedures or AADs at this time. chad2vasc score is at least 2.  I would advise long term anticoagulation.  2. Moderate AS At least moderate by exam Repeat echo at this time  3. OSA He is not compliant with CPAP I have advised that he discuss options with Dr Molli KnockYacoub.  Could consider referral for dental prosthesis if he is not willing to use CPAP.  4. Obesity Body mass index is 36.58 kg/m. Weight loss advised  5. HTN Stable No change required today  Follow-up:  Return to see me after updated echo  Current medicines are reviewed at length with the patient today.   The patient does not have concerns regarding his medicines.  The following changes were made today:  none  Labs/ tests ordered today include:  Orders Placed This Encounter  Procedures  . EKG 12-Lead  . ECHOCARDIOGRAM COMPLETE     Signed, Vernon RangeJames Charmane Protzman, MD    [facility] [address] [place] [address] [phone] (office) [phone] (fax)

## 2016-01-20 ENCOUNTER — Encounter: Payer: Self-pay | Admitting: Pulmonary Disease

## 2016-01-20 ENCOUNTER — Encounter (INDEPENDENT_AMBULATORY_CARE_PROVIDER_SITE_OTHER): Payer: Self-pay

## 2016-01-20 ENCOUNTER — Other Ambulatory Visit: Payer: Self-pay | Admitting: Pulmonary Disease

## 2016-01-20 ENCOUNTER — Ambulatory Visit (HOSPITAL_COMMUNITY): Payer: BLUE CROSS/BLUE SHIELD | Attending: Cardiovascular Disease

## 2016-01-20 ENCOUNTER — Other Ambulatory Visit: Payer: Self-pay

## 2016-01-20 DIAGNOSIS — I350 Nonrheumatic aortic (valve) stenosis: Secondary | ICD-10-CM | POA: Insufficient documentation

## 2016-01-20 DIAGNOSIS — I480 Paroxysmal atrial fibrillation: Secondary | ICD-10-CM | POA: Insufficient documentation

## 2016-01-20 DIAGNOSIS — I517 Cardiomegaly: Secondary | ICD-10-CM | POA: Insufficient documentation

## 2016-01-20 DIAGNOSIS — G4733 Obstructive sleep apnea (adult) (pediatric): Secondary | ICD-10-CM | POA: Insufficient documentation

## 2016-01-20 DIAGNOSIS — I501 Left ventricular failure: Secondary | ICD-10-CM | POA: Insufficient documentation

## 2016-01-20 LAB — ECHOCARDIOGRAM COMPLETE
AO mean calculated velocity dopler: 312 cm/s
AOPV: 0.21 m/s
AOVTI: 103 cm
AV Area VTI: 1.05 cm2
AV Area mean vel: 1.11 cm2
AV VEL mean LVOT/AV: 0.23
AV area mean vel ind: 0.54 cm2/m2
AV peak Index: 0.51
AV vel: 1.12
AVA: 1.12 cm2
AVAREAVTIIND: 0.54 cm2/m2
AVG: 45 mmHg
AVPG: 91 mmHg
AVPHT: 573 ms
AVPKVEL: 476 cm/s
Ao-asc: 41 cm
CHL CUP MV DEC (S): 187
CHL CUP RV SYS PRESS: 37 mmHg
E decel time: 187 msec
E/e' ratio: 16.16
FS: 35 % (ref 28–44)
IV/PV OW: 0.85
LA ID, A-P, ES: 46 mm
LA diam end sys: 46 mm
LA vol index: 35.9 mL/m2
LA vol: 74 mL
LADIAMINDEX: 2.23 cm/m2
LAVOLA4C: 71 mL
LDCA: 4.91 cm2
LV PW d: 13 mm — AB (ref 0.6–1.1)
LV SIMPSON'S DISK: 63
LV TDI E'MEDIAL: 6.14
LV dias vol: 144 mL (ref 62–150)
LVDIAVOLIN: 70 mL/m2
LVEEAVG: 16.16
LVEEMED: 16.16
LVELAT: 6.14 cm/s
LVOT VTI: 23.4 cm
LVOT diameter: 25 mm
LVOT peak grad rest: 4 mmHg
LVOT peak vel: 102 cm/s
LVOTSV: 115 mL
LVOTVTI: 0.23 cm
LVSYSVOL: 54 mL (ref 21–61)
LVSYSVOLIN: 26 mL/m2
MV Peak grad: 4 mmHg
MVPKAVEL: 69.6 m/s
MVPKEVEL: 99.2 m/s
Reg peak vel: 293 cm/s
Stroke v: 90 ml
TDI e' lateral: 6.14
TR max vel: 293 cm/s
Valve area index: 0.54

## 2016-01-20 NOTE — Progress Notes (Signed)
Past Surgical History He  has a past surgical history that includes Appendectomy and Hernia repair.  Allergies  Allergen Reactions  . Penicillins Rash    All over body  . Sulfa Antibiotics     Rash  . Sulfamethoxazole Rash    Family History His family history is not on file.  Social History He  reports that he has never smoked. He has never used smokeless tobacco. He reports that he does not drink alcohol or use drugs.  Review of systems 12 point ROS negative except knee pain.  Current Outpatient Prescriptions on File Prior to Visit  Medication Sig  . apixaban (ELIQUIS) 5 MG TABS tablet Take 5 mg by mouth 2 (two) times daily.  . B-D UF III MINI PEN NEEDLES 31G X 5 MM MISC U UTD D  . CARTIA XT 120 MG 24 hr capsule TAKE 1 CAPSULE BY MOUTH EVERY DAY  . Cyanocobalamin (VITAMIN B 12 PO) Take 1 tablet by mouth 2 (two) times a week.   . ezetimibe (ZETIA) 10 MG tablet Take 10 mg by mouth daily.  Marland Kitchen glimepiride (AMARYL) 4 MG tablet Take 2 mg by mouth 2 (two) times daily.  . Insulin Glargine (TOUJEO SOLOSTAR [place]) Inject 40 Units into the skin 2 (two) times daily. Pt taking 40 units once daily  . losartan (COZAAR) 100 MG tablet Take 50 mg by mouth daily.   . sitaGLIPtin-metformin (JANUMET) 50-1000 MG per tablet Take 1 tablet by mouth 2 (two) times daily with a meal.   No current facility-administered medications on file prior to visit.     Chief Complaint  Patient presents with  . Sleep Apnea    Tests: Echo 01/20/16 >> EF 60 to 65%, mild LVH, grade 2 diastolic dysfx, severe AS, mild/mod RA, PAS 37 mmHg  Past medical history He  has a past medical history of Aortic stenosis; Diabetes mellitus without complication (HCC); Hypertension; Obstructive sleep apnea; and Persistent atrial fibrillation (HCC).  Vital signs BP 130/75 (BP Location: Right Arm, Patient Position: Sitting, Cuff Size: Normal)   Pulse 76   Resp 14   Ht 5\' 5"  (1.651 m)   Wt 220 lb (99.8 kg)   SpO2 98%   BMI 36.61  kg/m   History of Present Illness Vernon Ruiz is a 65 y.o. male for evaluation of sleep problems.  His family has been concerned about his snoring.  He has been told he stops breathing while asleep.  He wakes up sometimes times with a dry mouth and has trouble falling back to sleep.  He gets tired easily during the day.  He goes to sleep at 10 pm.  He falls asleep quickly.  He wakes up some times to use the bathroom.  He gets out of bed at 7 am.  He feels tired in the morning.  He denies morning headache.  He does not use anything to help him fall sleep or stay awake.  He denies sleep walking, sleep talking, bruxism, or nightmares.  There is no history of restless legs.  He denies sleep hallucinations, sleep paralysis, or cataplexy.  The Epworth score is 12 out of 24.   Physical Exam:  General - No distress ENT - No sinus tenderness, no oral exudate, no LAN, no thyromegaly, TM clear, pupils equal/reactive, MP 3 Cardiac - s1s2 regular, 3/6 systolic murmur, pulses symmetric Chest - No wheeze/rales/dullness, good air entry, normal respiratory excursion Back - No focal tenderness Abd - Soft, non-tender, no organomegaly, + bowel  sounds Ext - mild swelling Lt knee Neuro - Normal strength, cranial nerves intact Skin - No rashes Psych - Normal mood, and behavior  Discussion: 65 yo male with snoring, sleep disruption, witnessed apnea, and daytime sleepiness.  He has hx of HTN, A fib.  His BMI is > 35.  He has prior hx of sleep apnea, but has not been on CPAP recently.  I am concerned he still has sleep apnea.  We discussed how sleep apnea can affect various health problems, including risks for hypertension, cardiovascular disease, and diabetes.  We also discussed how sleep disruption can increase risks for accidents, such as while driving.  Weight loss as a means of improving sleep apnea was also reviewed.  Additional treatment options discussed were CPAP therapy, oral appliance, and surgical  intervention.  Assessment/plan:  Obstructive sleep apnea. - will arrange for home sleep study  Obesity. - discussed importance of weight loss   Coralyn HellingVineet Eann Cleland, M.D. Pager [phone] 01/20/2016, 11:04 AM

## 2016-01-20 NOTE — Progress Notes (Signed)
Father in law of Dr. Molli Knock.  Hx of OSA >> not on therapy.  Hx of A fib, HTN, AS.  Snore, sleep disruption, apnea, daytime sleepiness.  BMI > 35.  Will arrange for home sleep study.

## 2016-02-05 ENCOUNTER — Ambulatory Visit (INDEPENDENT_AMBULATORY_CARE_PROVIDER_SITE_OTHER): Payer: BLUE CROSS/BLUE SHIELD | Admitting: Internal Medicine

## 2016-02-05 ENCOUNTER — Encounter: Payer: Self-pay | Admitting: Internal Medicine

## 2016-02-05 VITALS — BP 132/72 | HR 67 | Ht 65.0 in | Wt 218.0 lb

## 2016-02-05 DIAGNOSIS — I480 Paroxysmal atrial fibrillation: Secondary | ICD-10-CM | POA: Diagnosis not present

## 2016-02-05 DIAGNOSIS — I350 Nonrheumatic aortic (valve) stenosis: Secondary | ICD-10-CM

## 2016-02-05 DIAGNOSIS — I060 Rheumatic aortic stenosis: Secondary | ICD-10-CM | POA: Diagnosis not present

## 2016-02-05 DIAGNOSIS — I10 Essential (primary) hypertension: Secondary | ICD-10-CM | POA: Diagnosis not present

## 2016-02-05 NOTE — Patient Instructions (Addendum)
Medication Instructions:   Your physician recommends that you continue on your current medications as directed. Please refer to the Current Medication list given to you today.    Labwork: None ordered   Testing/Procedures: None ordered   Follow-Up:  You have been referred to Dr Excell Seltzer for Aortic Stenosis   Your physician recommends that you schedule a follow-up appointment in: 4 months with Dr Johney Frame

## 2016-02-05 NOTE — Progress Notes (Signed)
Electrophysiology Office Note   Date:  02/05/2016   ID:  Vernon Ruiz, DOB [birth year], MRN [id number]  PCP:  Pola Corn, MD  Cardiologist:  Dr Sharyn Lull Primary Electrophysiologist: Hillis Range, MD    Chief Complaint  Patient presents with  . Atrial Fibrillation     History of Present Illness: Vernon Ruiz is a 65 y.o. male who presents today for electrophysiology follow-up.   He presents today for second opinion regarding his afib and aortic stenosis.  He wishes to take less medicines.  He is also concerned about chronic anticoagulation.  He has occasional SOB with moderate activity.  He also has had some chest tightness with exertion.  No palpitations or symptoms related to afib.  Today, he denies symptoms of palpitations,  orthopnea, PND, lower extremity edema, claudication, dizziness, presyncope, syncope, bleeding, or neurologic sequela. The patient is tolerating medications without difficulties and is otherwise without complaint today.    Past Medical History:  Diagnosis Date  . Aortic stenosis   . Diabetes mellitus without complication (HCC)   . Hypertension   . Obstructive sleep apnea    noncompliant with CPAP  . Persistent atrial fibrillation [facility])    Past Surgical History:  Procedure Laterality Date  . APPENDECTOMY    . HERNIA REPAIR       Current Outpatient Prescriptions  Medication Sig Dispense Refill  . apixaban (ELIQUIS) 5 MG TABS tablet Take 5 mg by mouth 2 (two) times daily.    . B-D UF III MINI PEN NEEDLES 31G X 5 MM MISC U UTD D  2  . CARTIA XT 120 MG 24 hr capsule TAKE 1 CAPSULE BY MOUTH EVERY DAY 30 capsule 0  . Cyanocobalamin (VITAMIN B 12 PO) Take 1 tablet by mouth 2 (two) times a week.     . ezetimibe (ZETIA) 10 MG tablet Take 10 mg by mouth daily.    . Insulin Glargine (TOUJEO SOLOSTAR [place]) Inject 40 Units into the skin 2 (two) times daily. Pt taking 40 units once daily    . losartan (COZAAR) 100 MG tablet Take 50 mg by mouth daily.     .  TRULICITY 0.75 MG/0.5ML SOPN as directed. Once weekly  0   No current facility-administered medications for this visit.     Allergies:   Penicillins; Sulfa antibiotics; and Sulfamethoxazole   Social History:  The patient  reports that he has never smoked. He has never used smokeless tobacco. He reports that he does not drink alcohol or use drugs.   Family History:  The patient's FH notable for HTN   ROS:  Please see the history of present illness.   All other systems are reviewed and negative.    PHYSICAL EXAM: VS:  BP 132/72   Pulse 67   Ht 5\' 5"  (1.651 m)   Wt 218 lb (98.9 kg)   BMI 36.28 kg/m  , BMI Body mass index is 36.28 kg/m. GEN: Well nourished, well developed, in no acute distress  HEENT: normal  Neck: no JVD, carotid bruits, or masses Cardiac: RRR; 3/6 SEM LUSB which is late peaking,no edema  Respiratory:  clear to auscultation bilaterally, normal work of breathing GI: soft, nontender, nondistended, + BS MS: no deformity or atrophy  Skin: warm and dry  Neuro:  Strength and sensation are intact Psych: euthymic mood, full affect  EKG:  EKG is ordered today. The ekg ordered today shows sinus rhythm  Wt Readings from Last 3 Encounters:  02/05/16 218 lb (  98.9 kg)  01/20/16 220 lb (99.8 kg)  01/07/16 219 lb 12.8 oz (99.7 kg)     Other studies Reviewed: Additional studies/ records that were reviewed today include: recent echo is reviewed and reveals severe AS   ASSESSMENT AND PLAN:  1.  Persistent afib Though history suggests persistent afib, ekgs reviewed from Dr Annitta JerseyHarwani's office and again today reveal mostly sinus rhythm.  Minimal symptoms.  No indication for EP procedures or AADs at this time. chad2vasc score is at least 2.  I would advise long term anticoagulation.  IF he has AVR in the future, MAZE and LAA amputation may also be beneficial  2. Severe AS Will refer to Dr Excell Seltzerooper for consideration of treatment options. Ideally would have AVR with MAZE and  LAA amputation though I doubt that the patient would comply with this.  3. OSA Home sleep study has been ordered by [place] [facility] Study is pending  4. Obesity Body mass index is 36.28 kg/m. Weight loss advised  5. HTN Stable No change required today  Follow-up: refer to Dr Excell Seltzerooper Return to see me in 4 months  Current medicines are reviewed at length with the patient today.   The patient does not have concerns regarding his medicines.  The following changes were made today:  none  Labs/ tests ordered today include:  Orders Placed This Encounter  Procedures  . Ambulatory referral to Cardiology  . EKG 12-Lead     Signed, Hillis RangeJames Zi Newbury, MD    [facility] [address] [place] [address] [phone] (office) [phone] (fax)

## 2016-02-12 ENCOUNTER — Ambulatory Visit (INDEPENDENT_AMBULATORY_CARE_PROVIDER_SITE_OTHER): Payer: BLUE CROSS/BLUE SHIELD | Admitting: Cardiovascular Disease

## 2016-02-12 ENCOUNTER — Encounter: Payer: Self-pay | Admitting: Cardiovascular Disease

## 2016-02-12 VITALS — BP 150/74 | HR 68 | Ht 65.0 in | Wt 215.1 lb

## 2016-02-12 DIAGNOSIS — I350 Nonrheumatic aortic (valve) stenosis: Secondary | ICD-10-CM | POA: Diagnosis not present

## 2016-02-12 NOTE — Patient Instructions (Signed)
Medication Instructions:  Your physician recommends that you continue on your current medications as directed. Please refer to the Current Medication list given to you today.  Labwork: No new orders.   Testing/Procedures: Your physician has requested that you have a cardiac catheterization. Cardiac catheterization is used to diagnose and/or treat various heart conditions. Doctors may recommend this procedure for a number of different reasons. The most common reason is to evaluate chest pain. Chest pain can be a symptom of coronary artery disease (CAD), and cardiac catheterization can show whether plaque is narrowing or blocking your heart's arteries. This procedure is also used to evaluate the valves, as well as measure the blood flow and oxygen levels in different parts of your heart. For further information please visit www.cardiosmart.org. Please follow instruction sheet, as given.  Follow-Up: We will arrange further follow-up after cardiac catheterization.   Any Other Special Instructions Will Be Listed Below (If Applicable).     If you need a refill on your cardiac medications before your next appointment, please call your pharmacy.   

## 2016-02-12 NOTE — Progress Notes (Signed)
 Cardiology Office Note Date:  02/12/2016   ID:  Vernon Ruiz, DOB 06/12/1950, MRN [id number]  PCP:  SPRUILL,JEROME O, MD  Cardiologist:  Lashane Whelpley, MD    Chief Complaint  Patient presents with  . Aortic Stenosis    consult for Dr. Allred     History of Present Illness: Vernon Ruiz is a 65 y.o. male who presents for Evaluation of aortic stenosis.  He has a history of paroxysmal atrial fibrillation and known aortic stenosis. He initially underwent evaluation in 2015 when he presented with chest pain and shortness of breath in [place]. At that time he was noted to be in atrial fibrillation with RVR. At that time an echocardiogram was done and it apparently demonstrated severe aortic stenosis with a mean gradient of 41 mmHg and normal left ventricular function. On further evaluation a repeat echocardiogram in normal sinus rhythm demonstrated moderate aortic stenosis. He has been anticoagulated with apixaban.  He was recently seen by Dr. Allred for a second opinion about his atrial fibrillation and aortic stenosis. A follow-up echocardiogram 01/20/2016 shows progressive and now severe aortic stenosis with a mean gradient of 48 mmHg. Referred for further evaluation.  He is here alone today. Primary symptoms is central chest burning with physical exertion. States this has been present approximately 5 years without much progression. He denies dyspnea, orthopnea, PND. States chest burning symptoms are only intermittent and he has some days he has no symptoms with exertion. Notices this primarily when he gets in a hurry.  Past Medical History:  Diagnosis Date  . Aortic stenosis   . Diabetes mellitus without complication (HCC)   . Hypertension   . Obstructive sleep apnea    noncompliant with CPAP  . Persistent atrial fibrillation (HCC)     Past Surgical History:  Procedure Laterality Date  . APPENDECTOMY    . HERNIA REPAIR      Current Outpatient Prescriptions    Medication Sig Dispense Refill  . apixaban (ELIQUIS) 5 MG TABS tablet Take 5 mg by mouth 2 (two) times daily.    . B-D UF III MINI PEN NEEDLES 31G X 5 MM MISC U UTD D  2  . CARTIA XT 120 MG 24 hr capsule TAKE 1 CAPSULE BY MOUTH EVERY DAY 30 capsule 0  . Cyanocobalamin (VITAMIN B 12 PO) Take 1 tablet by mouth 2 (two) times a week.     . ezetimibe (ZETIA) 10 MG tablet Take 10 mg by mouth daily.    . Insulin Glargine (TOUJEO SOLOSTAR [place]) Inject 40 Units into the skin 2 (two) times daily. Pt taking 40 units once daily    . losartan (COZAAR) 100 MG tablet Take 50 mg by mouth daily.     . TRULICITY 0.75 MG/0.5ML SOPN Inject 1 Dose into the skin as directed. Once weekly  0   No current facility-administered medications for this visit.     Allergies:   Penicillins; Sulfa antibiotics; and Sulfamethoxazole   Social History:  The patient  reports that he has never smoked. He has never used smokeless tobacco. He reports that he does not drink alcohol or use drugs.   He is married with 3 daughters, 3 grandchildren.  Family History:  The patient's brother with atrial fibrillation, paternal uncle with CAD and aortic stenosis   ROS:  Please see the history of present illness.  Otherwise, review of systems is positive for bells's palsy a few months ago on the left side.  All other   systems are reviewed and negative.    PHYSICAL EXAM: VS:  BP (!) 150/74   Pulse 68   Ht 5' 5" (1.651 m)   Wt 97.6 kg (215 lb 1.9 oz)   BMI 35.80 kg/m  , BMI Body mass index is 35.8 kg/m. GEN: Well nourished, well developed, in no acute distress  HEENT: normal  Neck: no JVD, no masses. bilateral carotid bruits versus radiation of aortic murmur Cardiac: RRR with 3/6 late peaking harsh systolic murmur at the right upper sternal border           Respiratory:  clear to auscultation bilaterally, normal work of breathing GI: soft, nontender, nondistended, + BS MS: no deformity or atrophy  Ext: no pretibial edema, pedal  pulses 2+= bilaterally Skin: warm and dry, no rash Neuro:  Strength and sensation are intact Psych: euthymic mood, full affect  EKG:  EKG is not ordered today.  Recent Labs: No results found for requested labs within last 8760 hours.   Lipid Panel  No results found for: CHOL, TRIG, HDL, CHOLHDL, VLDL, LDLCALC, LDLDIRECT    Wt Readings from Last 3 Encounters:  02/12/16 97.6 kg (215 lb 1.9 oz)  02/05/16 98.9 kg (218 lb)  01/20/16 99.8 kg (220 lb)     Cardiac Studies Reviewed: 2D Echo 01-20-2016: Study Conclusions  - Left ventricle: The cavity size was normal. Wall thickness was   increased in a pattern of mild LVH. Systolic function was normal.   The estimated ejection fraction was in the range of 60% to 65%.   Wall motion was normal; there were no regional wall motion   abnormalities. Features are consistent with a pseudonormal left   ventricular filling pattern, with concomitant abnormal relaxation   and increased filling pressure (grade 2 diastolic dysfunction). - Aortic valve: There was severe stenosis. There was trivial   regurgitation. Mean gradient (S): 48 mm Hg. Peak gradient (S): 91   mm Hg. Valve area (VTI): 1.05 cm^2. Valve area (Vmax): 1.05 cm^2.   Valve area (Vmean): 1.07 cm^2. - Left atrium: The atrium was mildly dilated. - Right atrium: The atrium was mildly to moderately dilated. - Pulmonary arteries: Systolic pressure was mildly increased. PA   peak pressure: 37 mm Hg (S).  ASSESSMENT AND PLAN: 1.  Severe, stage D symptomatic aortic stenosis: The patient has symptoms of exertional angina. Suspect this is related to aggressive aortic stenosis. I have personally reviewed his echo images which demonstrate severe calcification and restriction of the aortic valve leaflets. There may be fusion of the left and non-coronary cusps. Gradients are clearly indicative of severe aortic stenosis. We have discussed the natural history of symptomatic aortic stenosis as well as  the treatment options in depth today. Aortic valve replacement is indicated to prevent progressive symptoms, reduce the risk of congestive heart failure, and improve mortality. The patient will require diagnostic right and left heart catheterization as part of his evaluation. I have reviewed the risks, indications, and alternatives to cardiac catheterization, possible angioplasty, and stenting with the patient. Risks include but are not limited to bleeding, infection, vascular injury, stroke, myocardial infection, arrhythmia, kidney injury, radiation-related injury in the case of prolonged fluoroscopy use, emergency cardiac surgery, and death. The patient understands the risks of serious complication is 1-2 in 1000 with diagnostic cardiac cath and 1-2% or less with angioplasty/stenting. All questions are answered. He will hold apixaban 24 hours before cath.  2. Paroxysmal atrial fibrillation: Reviewed recent note of Dr. Allred. If he undergo   surgery, would be reasonable to consider surgical maze and left atrial appendage ligation.  Current medicines are reviewed with the patient today.  The patient does not have concerns regarding medicines.  Labs/ tests ordered today include:  No orders of the defined types were placed in this encounter.   Disposition:   FU pending heart catheterization result. Likely refer to cardiac surgery following cath.  Signed, Carena Stream, MD  02/12/2016 1:09 PM    [facility] [address], [place], [place]  [address] Phone: [phone]; Fax: [phone]  

## 2016-02-15 ENCOUNTER — Other Ambulatory Visit (HOSPITAL_COMMUNITY): Payer: Self-pay | Admitting: Internal Medicine

## 2016-02-23 ENCOUNTER — Telehealth: Payer: Self-pay | Admitting: Internal Medicine

## 2016-02-23 NOTE — Telephone Encounter (Signed)
Echo was faxed to cardiologist office [facility]

## 2016-03-05 ENCOUNTER — Other Ambulatory Visit: Payer: Self-pay | Admitting: Cardiovascular Disease

## 2016-03-05 DIAGNOSIS — I350 Nonrheumatic aortic (valve) stenosis: Secondary | ICD-10-CM

## 2016-03-08 DIAGNOSIS — G4733 Obstructive sleep apnea (adult) (pediatric): Secondary | ICD-10-CM

## 2016-03-09 ENCOUNTER — Telehealth: Payer: Self-pay | Admitting: Cardiovascular Disease

## 2016-03-09 ENCOUNTER — Telehealth: Payer: Self-pay | Admitting: Pulmonary Disease

## 2016-03-09 DIAGNOSIS — G4733 Obstructive sleep apnea (adult) (pediatric): Secondary | ICD-10-CM

## 2016-03-09 NOTE — Telephone Encounter (Signed)
HST 03/08/16 >> AHI 81.2, SaO2 low 74%   Will have my nurse inform pt that he has severe obstructive sleep apnea with low oxygen level at night.  He needs to have in lab CPAP titration study to determine optimal set up.  I have placed order for this, and will call him with results.

## 2016-03-09 NOTE — Telephone Encounter (Signed)
Pt has Cath tomorrow and has medication question-pls call [phone]

## 2016-03-09 NOTE — Telephone Encounter (Signed)
Dr Excell Seltzer contacted the Vernon Ruiz in regards to Eliquis and confirmed that last dose was last night. Dr Excell Seltzer felt okay to proceed with cardiac catheterization on 03/10/16 at 10:00.  The Vernon Ruiz's diabetic medications have changed since his office visit with Dr Excell Seltzer.  I left the Vernon Ruiz a detailed message on voicemail.    Arrive at 8:00 AM, 03/10/16 NPO after midnight except sips of water with morning medications No more Eliquis until advised to restart No glimepiride the morning of procedure. Take half Insulin dose tonight and no Insulin the morning of procedure No Janumet tonight, tomorrow morning or 48 hours after procedure  Advised in voicemail for Vernon Ruiz to contact the office with any further questions.

## 2016-03-09 NOTE — Telephone Encounter (Signed)
I spoke with the pt and he called to ask what he needs to do with his Eliquis and diabetic medications in preparation for cardiac catheterization tomorrow.  I made the pt aware that he should have taken his last dose of Eliquis Sunday night but the pt said he has continued this medication and took it last night.  I asked the pt if he had the instructions that were given to him during his office visit and he said that he does not know what he did with them.  I advised him that his procedure will have to be rescheduled to next week. I will call the pt back with new instructions after I reschedule procedure.

## 2016-03-10 ENCOUNTER — Ambulatory Visit (HOSPITAL_COMMUNITY)
Admission: RE | Admit: 2016-03-10 | Discharge: 2016-03-10 | Disposition: A | Discharge status: Home / Self Care | Payer: BLUE CROSS/BLUE SHIELD | Source: Ambulatory Visit | Attending: Cardiovascular Disease | Admitting: Cardiovascular Disease

## 2016-03-10 ENCOUNTER — Other Ambulatory Visit: Payer: Self-pay | Admitting: *Deleted

## 2016-03-10 ENCOUNTER — Encounter (HOSPITAL_COMMUNITY)
Admission: RE | Disposition: A | Discharge status: Home / Self Care | Payer: Self-pay | Source: Ambulatory Visit | Attending: Cardiovascular Disease

## 2016-03-10 ENCOUNTER — Encounter (HOSPITAL_COMMUNITY): Payer: Self-pay | Admitting: *Deleted

## 2016-03-10 DIAGNOSIS — E119 Type 2 diabetes mellitus without complications: Secondary | ICD-10-CM | POA: Diagnosis not present

## 2016-03-10 DIAGNOSIS — I350 Nonrheumatic aortic (valve) stenosis: Secondary | ICD-10-CM | POA: Diagnosis present

## 2016-03-10 DIAGNOSIS — Z8249 Family history of ischemic heart disease and other diseases of the circulatory system: Secondary | ICD-10-CM | POA: Insufficient documentation

## 2016-03-10 DIAGNOSIS — G4733 Obstructive sleep apnea (adult) (pediatric): Secondary | ICD-10-CM | POA: Diagnosis not present

## 2016-03-10 DIAGNOSIS — Z794 Long term (current) use of insulin: Secondary | ICD-10-CM | POA: Insufficient documentation

## 2016-03-10 DIAGNOSIS — I480 Paroxysmal atrial fibrillation: Secondary | ICD-10-CM | POA: Diagnosis not present

## 2016-03-10 DIAGNOSIS — Z79899 Other long term (current) drug therapy: Secondary | ICD-10-CM | POA: Insufficient documentation

## 2016-03-10 DIAGNOSIS — I2510 Atherosclerotic heart disease of native coronary artery without angina pectoris: Secondary | ICD-10-CM | POA: Insufficient documentation

## 2016-03-10 DIAGNOSIS — Z7901 Long term (current) use of anticoagulants: Secondary | ICD-10-CM | POA: Diagnosis not present

## 2016-03-10 DIAGNOSIS — I10 Essential (primary) hypertension: Secondary | ICD-10-CM | POA: Diagnosis not present

## 2016-03-10 HISTORY — PX: CARDIAC CATHETERIZATION: SHX172

## 2016-03-10 LAB — BASIC METABOLIC PANEL
Anion gap: 9 (ref 5–15)
BUN: 15 mg/dL (ref 6–20)
CALCIUM: 9.4 mg/dL (ref 8.9–10.3)
CO2: 25 mmol/L (ref 22–32)
CREATININE: 1.01 mg/dL (ref 0.61–1.24)
Chloride: 102 mmol/L (ref 101–111)
GFR calc Af Amer: >60 mL/min (ref >60)
GLUCOSE: 253 mg/dL — AB (ref 65–99)
Potassium: 4.3 mmol/L (ref 3.5–5.1)
Sodium: 136 mmol/L (ref 135–145)

## 2016-03-10 LAB — PROTIME-INR
INR: 1.01
Prothrombin Time: 13.3 seconds (ref 11.4–15.2)

## 2016-03-10 LAB — POCT I-STAT 3, VENOUS BLOOD GAS (G3P V)
Acid-base deficit: 1 mmol/L (ref 0.0–2.0)
BICARBONATE: 26 mmol/L (ref 20.0–28.0)
Bicarbonate: 26.2 mmol/L (ref 20.0–28.0)
O2 SAT: 71 %
O2 SAT: 94 %
PCO2 VEN: 48.1 mmHg (ref 44.0–60.0)
PCO2 VEN: 52.9 mmHg (ref 44.0–60.0)
PH VEN: 7.344 (ref 7.250–7.430)
PO2 VEN: 40 mmHg (ref 32.0–45.0)
PO2 VEN: 77 mmHg — AB (ref 32.0–45.0)
TCO2: 28 mmol/L (ref 0–100)
TCO2: 28 mmol/L (ref 0–100)
pH, Ven: 7.3 (ref 7.250–7.430)

## 2016-03-10 LAB — GLUCOSE, CAPILLARY: Glucose-Capillary: 208 mg/dL — ABNORMAL HIGH (ref 65–99)

## 2016-03-10 LAB — CBC
HCT: 44.8 % (ref 39.0–52.0)
Hemoglobin: 15.2 g/dL (ref 13.0–17.0)
MCH: 27 pg (ref 26.0–34.0)
MCHC: 33.9 g/dL (ref 30.0–36.0)
MCV: 79.6 fL (ref 78.0–100.0)
PLATELETS: 271 10*3/uL (ref 150–400)
RBC: 5.63 MIL/uL (ref 4.22–5.81)
RDW: 13.7 % (ref 11.5–15.5)
WBC: 11.1 10*3/uL — AB (ref 4.0–10.5)

## 2016-03-10 SURGERY — RIGHT/LEFT HEART CATH AND CORONARY ANGIOGRAPHY
Anesthesia: LOCAL

## 2016-03-10 MED ORDER — SODIUM CHLORIDE 0.9 % IV SOLN: 250.0000 mL | INTRAVENOUS | Status: DC | PRN

## 2016-03-10 MED ORDER — MIDAZOLAM HCL 2 MG/2ML IJ SOLN
INTRAMUSCULAR | Status: DC | PRN
  Administered 2016-03-10: 2 mg via INTRAVENOUS

## 2016-03-10 MED ORDER — IOPAMIDOL (ISOVUE-370) INJECTION 76%
INTRAVENOUS | Status: AC
  Filled 2016-03-10: qty 100

## 2016-03-10 MED ORDER — HEPARIN SODIUM (PORCINE) 1000 UNIT/ML IJ SOLN
INTRAMUSCULAR | Status: DC | PRN
  Administered 2016-03-10: 5000 [IU] via INTRAVENOUS

## 2016-03-10 MED ORDER — SODIUM CHLORIDE 0.9% FLUSH: 3.0000 mL | INTRAVENOUS | Status: DC | PRN

## 2016-03-10 MED ORDER — ONDANSETRON HCL 4 MG/2ML IJ SOLN: 4.0000 mg | Freq: Four times a day (QID) | INTRAMUSCULAR | Status: DC | PRN

## 2016-03-10 MED ORDER — SODIUM CHLORIDE 0.9 % WEIGHT BASED INFUSION
3.0000 mL/kg/h | INTRAVENOUS | Status: DC
  Administered 2016-03-10: 3 mL/kg/h via INTRAVENOUS

## 2016-03-10 MED ORDER — SODIUM CHLORIDE 0.9 % IV SOLN: INTRAVENOUS | Status: AC

## 2016-03-10 MED ORDER — LIDOCAINE HCL (PF) 1 % IJ SOLN
INTRAMUSCULAR | Status: AC
Start: 2016-03-10 — End: 2016-03-10
  Filled 2016-03-10: qty 30

## 2016-03-10 MED ORDER — VERAPAMIL HCL 2.5 MG/ML IV SOLN
INTRAVENOUS | Status: DC | PRN
  Administered 2016-03-10: 10 mL via INTRA_ARTERIAL

## 2016-03-10 MED ORDER — HEPARIN SODIUM (PORCINE) 1000 UNIT/ML IJ SOLN
INTRAMUSCULAR | Status: AC
  Filled 2016-03-10: qty 1

## 2016-03-10 MED ORDER — MIDAZOLAM HCL 2 MG/2ML IJ SOLN
INTRAMUSCULAR | Status: AC
  Filled 2016-03-10: qty 2

## 2016-03-10 MED ORDER — ASPIRIN 81 MG PO CHEW
81.0000 mg | CHEWABLE_TABLET | ORAL | Status: AC
  Administered 2016-03-10: 81 mg via ORAL

## 2016-03-10 MED ORDER — FENTANYL CITRATE (PF) 100 MCG/2ML IJ SOLN
INTRAMUSCULAR | Status: AC
Start: 2016-03-10 — End: 2016-03-10
  Filled 2016-03-10: qty 2

## 2016-03-10 MED ORDER — SODIUM CHLORIDE 0.9% FLUSH: 3.0000 mL | Freq: Two times a day (BID) | INTRAVENOUS | Status: DC

## 2016-03-10 MED ORDER — HEPARIN (PORCINE) IN NACL 2-0.9 UNIT/ML-% IJ SOLN
INTRAMUSCULAR | Status: AC
  Filled 2016-03-10: qty 1500

## 2016-03-10 MED ORDER — IOPAMIDOL (ISOVUE-370) INJECTION 76%
INTRAVENOUS | Status: AC
  Filled 2016-03-10: qty 50

## 2016-03-10 MED ORDER — ASPIRIN 81 MG PO CHEW
CHEWABLE_TABLET | ORAL | Status: AC
  Filled 2016-03-10: qty 1

## 2016-03-10 MED ORDER — IOPAMIDOL (ISOVUE-370) INJECTION 76%
INTRAVENOUS | Status: DC | PRN
  Administered 2016-03-10: 95 mL

## 2016-03-10 MED ORDER — SODIUM CHLORIDE 0.9 % WEIGHT BASED INFUSION: 1.0000 mL/kg/h | INTRAVENOUS | Status: DC

## 2016-03-10 MED ORDER — HEPARIN (PORCINE) IN NACL 2-0.9 UNIT/ML-% IJ SOLN
INTRAMUSCULAR | Status: DC | PRN
Start: 2016-03-10 — End: 2016-03-10
  Administered 2016-03-10: 10:00:00

## 2016-03-10 MED ORDER — ACETAMINOPHEN 325 MG PO TABS: 650.0000 mg | ORAL_TABLET | ORAL | Status: DC | PRN

## 2016-03-10 MED ORDER — VERAPAMIL HCL 2.5 MG/ML IV SOLN
INTRAVENOUS | Status: AC
  Filled 2016-03-10: qty 2

## 2016-03-10 MED ORDER — FENTANYL CITRATE (PF) 100 MCG/2ML IJ SOLN
INTRAMUSCULAR | Status: DC | PRN
  Administered 2016-03-10: 25 ug via INTRAVENOUS

## 2016-03-10 SURGICAL SUPPLY — 15 items
CATH 5FR JL3.5 JR4 ANG PIG MP (CATHETERS) ×1 IMPLANT
CATH BALLN WEDGE 5F 110CM (CATHETERS) ×1 IMPLANT
CATH INFINITI 5 FR JR5 (CATHETERS) ×1 IMPLANT
CATH INFINITI 5FR AL1 (CATHETERS) ×1 IMPLANT
CATH INFINITI 5FR JL4 (CATHETERS) ×1 IMPLANT
DEVICE RAD COMP TR BAND LRG (VASCULAR PRODUCTS) ×1 IMPLANT
GLIDESHEATH SLEND SS 6F .021 (SHEATH) ×1 IMPLANT
GUIDEWIRE INQWIRE 1.5J.035X260 (WIRE) IMPLANT
INQWIRE 1.5J .035X260CM (WIRE) ×2
KIT HEART LEFT (KITS) ×2 IMPLANT
PACK CARDIAC CATHETERIZATION (CUSTOM PROCEDURE TRAY) ×2 IMPLANT
SHEATH FAST CATH BRACH 5F 5CM (SHEATH) ×1 IMPLANT
TRANSDUCER W/STOPCOCK (MISCELLANEOUS) ×3 IMPLANT
TUBING CIL FLEX 10 FLL-RA (TUBING) ×2 IMPLANT
WIRE EMERALD 3MM-J .025X260CM (WIRE) ×1 IMPLANT

## 2016-03-10 NOTE — Discharge Instructions (Signed)
Start back on eliquis  12/1 per dr cooper Radial Site Care Introduction Refer to this sheet in the next few weeks. These instructions provide you with information about caring for yourself after your procedure. Your health care provider may also give you more specific instructions. Your treatment has been planned according to current medical practices, but problems sometimes occur. Call your health care provider if you have any problems or questions after your procedure. What can I expect after the procedure? After your procedure, it is typical to have the following:  Bruising at the radial site that usually fades within 1-2 weeks.  Blood collecting in the tissue (hematoma) that may be painful to the touch. It should usually decrease in size and tenderness within 1-2 weeks. Follow these instructions at home:  Take medicines only as directed by your health care provider.  You may shower 24-48 hours after the procedure or as directed by your health care provider. Remove the bandage (dressing) and gently wash the site with plain soap and water. Pat the area dry with a clean towel. Do not rub the site, because this may cause bleeding.  Do not take baths, swim, or use a hot tub until your health care provider approves.  Check your insertion site every day for redness, swelling, or drainage.  Do not apply powder or lotion to the site.  Do not flex or bend the affected arm for 24 hours or as directed by your health care provider.  Do not push or pull heavy objects with the affected arm for 24 hours or as directed by your health care provider.  Do not lift over 10 lb (4.5 kg) for 5 days after your procedure or as directed by your health care provider.  Ask your health care provider when it is okay to:  Return to work or school.  Resume usual physical activities or sports.  Resume sexual activity.  Do not drive home if you are discharged the same day as the procedure. Have someone else  drive you.  You may drive 24 hours after the procedure unless otherwise instructed by your health care provider.  Do not operate machinery or power tools for 24 hours after the procedure.  If your procedure was done as an outpatient procedure, which means that you went home the same day as your procedure, a responsible adult should be with you for the first 24 hours after you arrive home.  Keep all follow-up visits as directed by your health care provider. This is important. Contact a health care provider if:  You have a fever.  You have chills.  You have increased bleeding from the radial site. Hold pressure on the site. CALL 911 Get help right away if:  You have unusual pain at the radial site.  You have redness, warmth, or swelling at the radial site.  You have drainage (other than a small amount of blood on the dressing) from the radial site.  The radial site is bleeding, and the bleeding does not stop after 30 minutes of holding steady pressure on the site.  Your arm or hand becomes pale, cool, tingly, or numb. This information is not intended to replace advice given to you by your health care provider. Make sure you discuss any questions you have with your health care provider. Document Released: 05/01/2010 Document Revised: 09/04/2015 Document Reviewed: 10/15/2013  2017 Elsevier

## 2016-03-10 NOTE — Interval H&P Note (Signed)
History and Physical Interval Note:  03/10/2016 9:59 AM  Vernon Ruiz  has presented today for surgery, with the diagnosis of aortic stenosis  The various methods of treatment have been discussed with the patient and family. After consideration of risks, benefits and other options for treatment, the patient has consented to  Procedure(s): Right/Left Heart Cath and Coronary Angiography (N/A) as a surgical intervention .  The patient's history has been reviewed, patient examined, no change in status, stable for surgery.  I have reviewed the patient's chart and labs.  Questions were answered to the patient's satisfaction.     [name]

## 2016-03-10 NOTE — Progress Notes (Signed)
Site area: rt ac brachial venous sheath Site Prior to Removal:  Level 0 Pressure Applied For: 20 minutes Manual:   yes Patient Status During Pull:  stable Post Pull Site:  Level yes Post Pull Instructions Given:  yes Post Pull Pulses Present: yes Dressing Applied:  Gauze/small tegaderm Bedrest begins @ 1125 Comments:

## 2016-03-10 NOTE — Progress Notes (Addendum)
1345 hematoma right radial site, 5 cc added, manual pressure held 15 minutes, has slight bruising. Dr Excell Seltzer called. Dr Excell Seltzer placed coban after tr band removed, pt to remove coban  in 2 hours. Restart eliquis in 2 days, pt aware and verbalized understanding.

## 2016-03-10 NOTE — H&P (View-Only) (Signed)
Cardiology Office Note Date:  02/12/2016   ID:  Vernon Ruiz, DOB [birth year], MRN [id number]  PCP:  Pola Corn, MD  Cardiologist:  [name], MD    Chief Complaint  Patient presents with  . Aortic Stenosis    consult for Dr. Johney Frame     History of Present Illness: Vernon Ruiz is a 65 y.o. male who presents for Evaluation of aortic stenosis.  He has a history of paroxysmal atrial fibrillation and known aortic stenosis. He initially underwent evaluation in 2015 when he presented with chest pain and shortness of breath in [place]. At that time he was noted to be in atrial fibrillation with RVR. At that time an echocardiogram was done and it apparently demonstrated severe aortic stenosis with a mean gradient of 41 mmHg and normal left ventricular function. On further evaluation a repeat echocardiogram in normal sinus rhythm demonstrated moderate aortic stenosis. He has been anticoagulated with apixaban.  He was recently seen by Dr. Johney Frame for a second opinion about his atrial fibrillation and aortic stenosis. A follow-up echocardiogram 01/20/2016 shows progressive and now severe aortic stenosis with a mean gradient of 48 mmHg. Referred for further evaluation.  He is here alone today. Primary symptoms is central chest burning with physical exertion. States this has been present approximately 5 years without much progression. He denies dyspnea, orthopnea, PND. States chest burning symptoms are only intermittent and he has some days he has no symptoms with exertion. Notices this primarily when he gets in a hurry.  Past Medical History:  Diagnosis Date  . Aortic stenosis   . Diabetes mellitus without complication (HCC)   . Hypertension   . Obstructive sleep apnea    noncompliant with CPAP  . Persistent atrial fibrillation [facility])     Past Surgical History:  Procedure Laterality Date  . APPENDECTOMY    . HERNIA REPAIR      Current Outpatient Prescriptions    Medication Sig Dispense Refill  . apixaban (ELIQUIS) 5 MG TABS tablet Take 5 mg by mouth 2 (two) times daily.    . B-D UF III MINI PEN NEEDLES 31G X 5 MM MISC U UTD D  2  . CARTIA XT 120 MG 24 hr capsule TAKE 1 CAPSULE BY MOUTH EVERY DAY 30 capsule 0  . Cyanocobalamin (VITAMIN B 12 PO) Take 1 tablet by mouth 2 (two) times a week.     . ezetimibe (ZETIA) 10 MG tablet Take 10 mg by mouth daily.    . Insulin Glargine (TOUJEO SOLOSTAR [place]) Inject 40 Units into the skin 2 (two) times daily. Pt taking 40 units once daily    . losartan (COZAAR) 100 MG tablet Take 50 mg by mouth daily.     . TRULICITY 0.75 MG/0.5ML SOPN Inject 1 Dose into the skin as directed. Once weekly  0   No current facility-administered medications for this visit.     Allergies:   Penicillins; Sulfa antibiotics; and Sulfamethoxazole   Social History:  The patient  reports that he has never smoked. He has never used smokeless tobacco. He reports that he does not drink alcohol or use drugs.   He is married with 3 daughters, 3 grandchildren.  Family History:  The patient's brother with atrial fibrillation, paternal uncle with CAD and aortic stenosis   ROS:  Please see the history of present illness.  Otherwise, review of systems is positive for bells's palsy a few months ago on the left side.  All other  systems are reviewed and negative.    PHYSICAL EXAM: VS:  BP (!) 150/74   Pulse 68   Ht 5\' 5"  (1.651 m)   Wt 97.6 kg (215 lb 1.9 oz)   BMI 35.80 kg/m  , BMI Body mass index is 35.8 kg/m. GEN: Well nourished, well developed, in no acute distress  HEENT: normal  Neck: no JVD, no masses. bilateral carotid bruits versus radiation of aortic murmur Cardiac: RRR with 3/6 late peaking harsh systolic murmur at the right upper sternal border           Respiratory:  clear to auscultation bilaterally, normal work of breathing GI: soft, nontender, nondistended, + BS MS: no deformity or atrophy  Ext: no pretibial edema, pedal  pulses 2+= bilaterally Skin: warm and dry, no rash Neuro:  Strength and sensation are intact Psych: euthymic mood, full affect  EKG:  EKG is not ordered today.  Recent Labs: No results found for requested labs within last 8760 hours.   Lipid Panel  No results found for: CHOL, TRIG, HDL, CHOLHDL, VLDL, LDLCALC, LDLDIRECT    Wt Readings from Last 3 Encounters:  02/12/16 97.6 kg (215 lb 1.9 oz)  02/05/16 98.9 kg (218 lb)  01/20/16 99.8 kg (220 lb)     Cardiac Studies Reviewed: 2D Echo 01-20-2016: Study Conclusions  - Left ventricle: The cavity size was normal. Wall thickness was   increased in a pattern of mild LVH. Systolic function was normal.   The estimated ejection fraction was in the range of 60% to 65%.   Wall motion was normal; there were no regional wall motion   abnormalities. Features are consistent with a pseudonormal left   ventricular filling pattern, with concomitant abnormal relaxation   and increased filling pressure (grade 2 diastolic dysfunction). - Aortic valve: There was severe stenosis. There was trivial   regurgitation. Mean gradient (S): 48 mm Hg. Peak gradient (S): 91   mm Hg. Valve area (VTI): 1.05 cm^2. Valve area (Vmax): 1.05 cm^2.   Valve area (Vmean): 1.07 cm^2. - Left atrium: The atrium was mildly dilated. - Right atrium: The atrium was mildly to moderately dilated. - Pulmonary arteries: Systolic pressure was mildly increased. PA   peak pressure: 37 mm Hg (S).  ASSESSMENT AND PLAN: 1.  Severe, stage D symptomatic aortic stenosis: The patient has symptoms of exertional angina. Suspect this is related to aggressive aortic stenosis. I have personally reviewed his echo images which demonstrate severe calcification and restriction of the aortic valve leaflets. There may be fusion of the left and non-coronary cusps. Gradients are clearly indicative of severe aortic stenosis. We have discussed the natural history of symptomatic aortic stenosis as well as  the treatment options in depth today. Aortic valve replacement is indicated to prevent progressive symptoms, reduce the risk of congestive heart failure, and improve mortality. The patient will require diagnostic right and left heart catheterization as part of his evaluation. I have reviewed the risks, indications, and alternatives to cardiac catheterization, possible angioplasty, and stenting with the patient. Risks include but are not limited to bleeding, infection, vascular injury, stroke, myocardial infection, arrhythmia, kidney injury, radiation-related injury in the case of prolonged fluoroscopy use, emergency cardiac surgery, and death. The patient understands the risks of serious complication is 1-2 in 1000 with diagnostic cardiac cath and 1-2% or less with angioplasty/stenting. All questions are answered. He will hold apixaban 24 hours before cath.  2. Paroxysmal atrial fibrillation: Reviewed recent note of Dr. Johney FrameAllred. If he undergo  surgery, would be reasonable to consider surgical maze and left atrial appendage ligation.  Current medicines are reviewed with the patient today.  The patient does not have concerns regarding medicines.  Labs/ tests ordered today include:  No orders of the defined types were placed in this encounter.   Disposition:   FU pending heart catheterization result. Likely refer to cardiac surgery following cath.  Enzo Bi, MD  02/12/2016 1:09 PM    [facility] [address] [place], [place], [place]  [address] Phone: [phone]; Fax: [phone]

## 2016-03-11 ENCOUNTER — Encounter (HOSPITAL_COMMUNITY): Payer: Self-pay | Admitting: Cardiovascular Disease

## 2016-03-11 MED FILL — Lidocaine HCl Local Preservative Free (PF) Inj 1%: INTRAMUSCULAR | Qty: 30 | Status: AC

## 2016-03-16 ENCOUNTER — Other Ambulatory Visit (HOSPITAL_COMMUNITY): Payer: Self-pay | Admitting: Internal Medicine

## 2016-03-19 NOTE — Telephone Encounter (Signed)
LM x 1 

## 2016-04-02 NOTE — Telephone Encounter (Signed)
Pt already scheduled for the in lab study so will go ahead and close encounter

## 2016-04-29 ENCOUNTER — Encounter (HOSPITAL_BASED_OUTPATIENT_CLINIC_OR_DEPARTMENT_OTHER): Payer: BLUE CROSS/BLUE SHIELD

## 2016-06-07 ENCOUNTER — Ambulatory Visit: Payer: BLUE CROSS/BLUE SHIELD | Admitting: Internal Medicine

## 2016-07-21 ENCOUNTER — Ambulatory Visit: Payer: BLUE CROSS/BLUE SHIELD | Admitting: Internal Medicine

## 2016-07-22 ENCOUNTER — Encounter: Payer: Self-pay | Admitting: Internal Medicine

## 2016-07-28 ENCOUNTER — Encounter: Payer: Self-pay | Admitting: Internal Medicine

## 2016-08-05 ENCOUNTER — Ambulatory Visit (INDEPENDENT_AMBULATORY_CARE_PROVIDER_SITE_OTHER): Payer: BLUE CROSS/BLUE SHIELD | Admitting: Cardiovascular Disease

## 2016-08-05 ENCOUNTER — Telehealth: Payer: Self-pay | Admitting: *Deleted

## 2016-08-05 ENCOUNTER — Encounter: Payer: Self-pay | Admitting: Cardiovascular Disease

## 2016-08-05 VITALS — BP 116/68 | HR 60 | Ht 65.0 in | Wt 214.0 lb

## 2016-08-05 DIAGNOSIS — I350 Nonrheumatic aortic (valve) stenosis: Secondary | ICD-10-CM | POA: Diagnosis not present

## 2016-08-05 NOTE — Telephone Encounter (Signed)
Patient had an appointment with Dr Excell Seltzer and requested eliquis samples. Samples provided.

## 2016-08-05 NOTE — Progress Notes (Signed)
Cardiology Office Note Date:  08/05/2016   ID:  Vernon Ruiz 1950-10-16, MRN [id number]  PCP:  Pola Corn, MD  Cardiologist:  [name], MD    Chief Complaint  Patient presents with  . Aortic Stenosis    follow up     History of Present Illness: Vernon Ruiz is a 66 y.o. male who presents for follow-up of aortic stenosis. The patient has a history of paroxysmal atrial fibrillation and aortic stenosis. He is chronically anticoagulated with apixaban. His most recent echocardiogram from October 2017 demonstrated severe calcific aortic stenosis with a mean gradient of 48 mmHg. The patient underwent cardiac catheterization in November 2017 demonstrating diffuse nonobstructive coronary artery disease. He has had fairly mild symptoms and has wished to defer on surgical aortic valve replacement.  He returns today for follow-up evaluation. He is here alone. His symptoms are unchanged. He has burning in the center of his chest only when he gets in a big hurry or does heavy exertion. With all of his normal activities he has no symptoms. He denies shortness of breath, heart palpitations, lightheadedness, or frank syncope. He is compliant with his medications. He reports no bleeding problems.   Past Medical History:  Diagnosis Date  . Aortic stenosis   . Diabetes mellitus without complication (HCC)   . Hypertension   . Obstructive sleep apnea    noncompliant with CPAP  . Persistent atrial fibrillation [facility])     Past Surgical History:  Procedure Laterality Date  . APPENDECTOMY    . CARDIAC CATHETERIZATION N/A 03/10/2016   Procedure: Right/Left Heart Cath and Coronary Angiography;  Surgeon: [name], MD;  Location: [facility] INVASIVE CV LAB;  Service: Cardiovascular;  Laterality: N/A;  . HERNIA REPAIR      Current Outpatient Prescriptions  Medication Sig Dispense Refill  . apixaban (ELIQUIS) 5 MG TABS tablet Take 5 mg by mouth 2 (two) times daily.    Marland Kitchen CARTIA XT 120 MG 24  hr capsule TAKE 1 CAPSULE BY MOUTH EVERY DAY 30 capsule 0  . Cyanocobalamin (VITAMIN B 12 PO) Take 1 tablet by mouth 2 (two) times a week.     . ezetimibe (ZETIA) 10 MG tablet Take 10 mg by mouth daily.    Marland Kitchen glimepiride (AMARYL) 4 MG tablet Take 4 mg by mouth daily with breakfast.    . Insulin Glargine (TOUJEO SOLOSTAR [name]) Inject 50 Units into the skin daily.     Marland Kitchen JANUMET 50-1000 MG tablet Take 1 tablet by mouth 2 (two) times daily.  0  . losartan (COZAAR) 100 MG tablet Take 50 mg by mouth daily.     . metFORMIN (GLUCOPHAGE) 1000 MG tablet Take 1 tablet by mouth 2 (two) times daily.  1  . TRULICITY 1.5 MG/0.5ML SOPN Inject 1.5 mg into the skin once a week.  2   No current facility-administered medications for this visit.     Allergies:   Penicillins; Sulfa antibiotics; and Sulfamethoxazole   Social History:  The patient  reports that he has never smoked. He has never used smokeless tobacco. He reports that he does not drink alcohol or use drugs.   Family History:  The patient's family history is not on file.  ROS:  Please see the history of present illness.  All other systems are reviewed and negative.   PHYSICAL EXAM: VS:  BP 116/68   Pulse 60   Ht  (1.651 m)   Wt 214 lb (97.1 kg)  BMI 35.61 kg/m  , BMI Body mass index is 35.61 kg/m. GEN: Well nourished, well developed, in no acute distress  HEENT: normal  Neck: no JVD, no masses. bilateral carotid bruits Cardiac: RRR with 3/6 late peaking harsh systolic murmur at the RUSB, no diastolic murmur            Respiratory:  clear to auscultation bilaterally, normal work of breathing GI: soft, nontender, nondistended, + BS MS: no deformity or atrophy  Ext: no pretibial edema, pedal pulses 2+= bilaterally Skin: warm and dry, no rash Neuro:  Strength and sensation are intact Psych: euthymic mood, full affect  EKG:  EKG is ordered today. The ekg ordered today shows normal sinus rhythm 61 bpm, rightward axis, no other  significant changes noted  Recent Labs: 03/10/2016: BUN 15; Creatinine, Ser 1.01; Hemoglobin 15.2; Platelets 271; Potassium 4.3; Sodium 136   Lipid Panel  No results found for: CHOL, TRIG, HDL, CHOLHDL, VLDL, LDLCALC, LDLDIRECT    Wt Readings from Last 3 Encounters:  08/05/16 214 lb (97.1 kg)  03/10/16 215 lb (97.5 kg)  02/12/16 215 lb 1.9 oz (97.6 kg)     Cardiac Studies Reviewed: Cardiac Cath: Conclusion   1. Diffuse nonobstructive CAD 2. Moderately severe aortic stenosis 3. Normal right heart hemodynamics/normal LVEDP  Pt with echo criteria for severe aortic stenosis (mean gradient 42 mmHg, peak velocity 4 m/s) and borderline cath criteria. Reasonable to consider aortic valve replacement as he does have angina at a high workload with no other explanation for symptoms. He wishes to wait several months for surgical referral because of insurance reasons. He is counseled about keeping an eye out for progressive symptoms.    Echo: Left ventricle:  The cavity size was normal. Wall thickness was increased in a pattern of mild LVH. Systolic function was normal. The estimated ejection fraction was in the range of 60% to 65%. Wall motion was normal; there were no regional wall motion abnormalities. Features are consistent with a pseudonormal left ventricular filling pattern, with concomitant abnormal relaxation and increased filling pressure (grade 2 diastolic dysfunction).  ------------------------------------------------------------------- Aortic valve:   Severely calcified leaflets.  Doppler:   There was severe stenosis.   There was trivial regurgitation.    VTI ratio of LVOT to aortic valve: 0.21. Valve area (VTI): 1.05 cm^2. Indexed valve area (VTI): 0.48 cm^2/m^2. Peak velocity ratio of LVOT to aortic valve: 0.21. Valve area (Vmax): 1.05 cm^2. Indexed valve area (Vmax): 0.48 cm^2/m^2. Mean velocity ratio of LVOT to aortic valve: 0.22. Valve area (Vmean): 1.07 cm^2. Indexed  valve area (Vmean): 0.49 cm^2/m^2.    Mean gradient (S): 48 mm Hg. Peak gradient (S): 91 mm Hg.  ------------------------------------------------------------------- Aorta:  Aortic root: The aortic root was normal in size. Ascending aorta: The ascending aorta was normal in size.  ------------------------------------------------------------------- Mitral valve:   Structurally normal valve.   Leaflet separation was normal.  Doppler:  Transvalvular velocity was within the normal range. There was no evidence for stenosis. There was trivial regurgitation.    Peak gradient (D): 4 mm Hg.  ------------------------------------------------------------------- Left atrium:  The atrium was mildly dilated.  ------------------------------------------------------------------- Right ventricle:  The cavity size was normal. Systolic function was normal.  ------------------------------------------------------------------- Pulmonic valve:    The valve appears to be grossly normal. Doppler:  There was no significant regurgitation.  ------------------------------------------------------------------- Tricuspid valve:   The valve appears to be grossly normal. Doppler:  There was trivial regurgitation.  ------------------------------------------------------------------- Pulmonary artery:   Systolic pressure was mildly increased.  -------------------------------------------------------------------  Right atrium:  The atrium was mildly to moderately dilated.  ------------------------------------------------------------------- Pericardium:  There was no pericardial effusion.  ASSESSMENT AND PLAN: 1.  Severe, stage D, aortic stenosis: The patient reports stable mild symptoms. His exam and noninvasive studies are clearly consistent with severe aortic stenosis. He wishes to wait until July to move forward with any type of surgical evaluation. I think that is reasonable considering his mild symptoms and  lack of progression. Will arrange an echocardiogram followed by an office consultation with Dr. Laneta Simmers in July.  2. Paroxysmal atrial fibrillation: Maintaining sinus rhythm, on Apixaban.  3. Type 2 diabetes: Managed by Dr. Chestine Spore. Treated with oral hypoglycemic agents. He continues to work on weight loss. Lifestyle modification counseling is done today.  4. Hypertension: Blood pressure controlled on diltiazem and losartan. He states that weight loss is really help with his blood pressure control.  Current medicines are reviewed with the patient today.  The patient does not have concerns regarding medicines.  Labs/ tests ordered today include:  No orders of the defined types were placed in this encounter.  Disposition:   FU 6 months  Signed, [name], MD  08/05/2016 11:38 AM    [facility] [address] [place], [place], [place]  [address] Phone: [phone]; Fax: [phone]

## 2016-08-05 NOTE — Patient Instructions (Addendum)
Medication Instructions:  Your physician recommends that you continue on your current medications as directed. Please refer to the Current Medication list given to you today.  Labwork: No new orders.   Testing/Procedures: Your physician has requested that you have an echocardiogram in July. Echocardiography is a painless test that uses sound waves to create images of your heart. It provides your doctor with information about the size and shape of your heart and how well your heart's chambers and valves are working. This procedure takes approximately one hour. There are no restrictions for this procedure.  Follow-Up: You have been referred to Dr Laneta Simmers at [facility] for evaluation of Aortic Stenosis (patient requests this appointment in July)  Your physician wants you to follow-up in: 6 MONTHS with Dr Excell Seltzer. You will receive a reminder letter in the mail two months in advance. If you don't receive a letter, please call our office to schedule the follow-up appointment.    Any Other Special Instructions Will Be Listed Below (If Applicable).     If you need a refill on your cardiac medications before your next appointment, please call your pharmacy.

## 2016-09-13 ENCOUNTER — Other Ambulatory Visit: Payer: Self-pay

## 2016-09-13 ENCOUNTER — Ambulatory Visit (HOSPITAL_COMMUNITY): Payer: Medicare Other | Attending: Cardiovascular Disease

## 2016-09-13 DIAGNOSIS — I10 Essential (primary) hypertension: Secondary | ICD-10-CM | POA: Insufficient documentation

## 2016-09-13 DIAGNOSIS — E119 Type 2 diabetes mellitus without complications: Secondary | ICD-10-CM | POA: Diagnosis not present

## 2016-09-13 DIAGNOSIS — I352 Nonrheumatic aortic (valve) stenosis with insufficiency: Secondary | ICD-10-CM | POA: Insufficient documentation

## 2016-09-13 DIAGNOSIS — I350 Nonrheumatic aortic (valve) stenosis: Secondary | ICD-10-CM | POA: Diagnosis not present

## 2016-09-13 DIAGNOSIS — I4891 Unspecified atrial fibrillation: Secondary | ICD-10-CM | POA: Insufficient documentation

## 2016-09-13 DIAGNOSIS — G4733 Obstructive sleep apnea (adult) (pediatric): Secondary | ICD-10-CM | POA: Insufficient documentation

## 2016-09-15 ENCOUNTER — Institutional Professional Consult (permissible substitution) (INDEPENDENT_AMBULATORY_CARE_PROVIDER_SITE_OTHER): Payer: Medicare Other | Admitting: Surgery

## 2016-09-15 ENCOUNTER — Encounter: Payer: Self-pay | Admitting: Surgery

## 2016-09-15 VITALS — BP 113/70 | HR 68 | Resp 20 | Ht 65.0 in | Wt 219.0 lb

## 2016-09-15 DIAGNOSIS — I350 Nonrheumatic aortic (valve) stenosis: Secondary | ICD-10-CM | POA: Diagnosis not present

## 2016-09-16 ENCOUNTER — Other Ambulatory Visit: Payer: Self-pay | Admitting: *Deleted

## 2016-09-16 ENCOUNTER — Other Ambulatory Visit: Payer: Self-pay

## 2016-09-16 ENCOUNTER — Encounter: Payer: Self-pay | Admitting: Surgery

## 2016-09-16 DIAGNOSIS — I350 Nonrheumatic aortic (valve) stenosis: Secondary | ICD-10-CM

## 2016-09-16 NOTE — Progress Notes (Signed)
Cardiothoracic Surgery Consultation  PCP is Donia Guiles, MD Referring Provider is [name], MD  Chief Complaint  Patient presents with  . Aortic Stenosis    Surgical eval, ECHO 09/13/16, Cardic Cath 03/10/16    HPI:  The patient is a 66 year old gentleman with type 2 IDDM, hypertension, OSA, PAF on Eliquis and known aortic stenosis. He had presented with some shortness of breath and chest pain in 2015 and was noted to be in atrial fib with RVR. Echo at that time showed moderate AS with a mean gradient of 28 mm Hg. He says that since then he has had no palpitations and if he is having atrial fib he never knows it. He was seen by Dr. Johney Frame in 01/2016 for a second opinion about his atrial fib. His ECG' s had mostly shown sinus rhythm and no further treatment recommended.  His echo in 01/2016 showed severe AS with a mean gradient of 48 mm Hg and a peak of 91 mm Hg. His LVEF was 60-65%. Surgery was recommended and he underwent cath on 03/10/2016 showing diffuse non-obstructive CAD with moderate AS with a mean AV gradient of 28.7 mm Hg, a peak of 29 mm Hg and an AVA of 1.2 cm2. His symptoms were only chest pressure with heavy exertion and he decided to hold off on surgery for a while. He had a follow up echo on 09/13/2016 which showed a mean gradient of 38 mm Hg with a peak of 66 mm Hg. The DI was 0.25 and indexed valve area 0.34 cm2/m2. He reports that his symptoms have not changed. He has no shortness of breath, dizziness, syncope, or fatigue. He only gets some substernal chest pressure with heavy exertion, relieved with rest.    Past Medical History:  Diagnosis Date  . Aortic stenosis   . Diabetes mellitus without complication (HCC)   . Hypertension   . Obstructive sleep apnea    noncompliant with CPAP  . Persistent atrial fibrillation [facility])     Past Surgical History:  Procedure Laterality Date  . APPENDECTOMY    . CARDIAC CATHETERIZATION N/A 03/10/2016   Procedure: Right/Left  Heart Cath and Coronary Angiography;  Surgeon: [name], MD;  Location: [facility] INVASIVE CV LAB;  Service: Cardiovascular;  Laterality: N/A;  . HERNIA REPAIR      FH: he denies any family history of aortic valve disease. Mother had coronary disease.  Social History Social History  Substance Use Topics  . Smoking status: Smokes 1-2 cigars per day  . Smokeless tobacco: Never Used  . Alcohol use No    Current Outpatient Prescriptions  Medication Sig Dispense Refill  . apixaban (ELIQUIS) 5 MG TABS tablet Take 5 mg by mouth 2 (two) times daily.    Marland Kitchen CARTIA XT 120 MG 24 hr capsule TAKE 1 CAPSULE BY MOUTH EVERY DAY 30 capsule 0  . Cyanocobalamin (VITAMIN B 12 PO) Take 1 tablet by mouth 2 (two) times a week.     . Insulin Glargine (TOUJEO SOLOSTAR [place]) Inject 50 Units into the skin daily.     Marland Kitchen losartan (COZAAR) 100 MG tablet Take 50 mg by mouth daily.     . metFORMIN (GLUCOPHAGE) 1000 MG tablet Take 1 tablet by mouth 2 (two) times daily.  1  . TRULICITY 1.5 MG/0.5ML SOPN Inject 1.5 mg into the skin once a week.  2  . ezetimibe (ZETIA) 10 MG tablet Take 10 mg by mouth daily.    Marland Kitchen glimepiride (AMARYL)  4 MG tablet Take 4 mg by mouth daily with breakfast.    . JANUMET 50-1000 MG tablet Take 1 tablet by mouth 2 (two) times daily.  0   No current facility-administered medications for this visit.     Allergies  Allergen Reactions  . Penicillins Itching and Rash    All over body  Has patient had a PCN reaction causing immediate rash, facial/tongue/throat swelling, SOB or lightheadedness with hypotension: no Has patient had a PCN reaction causing severe rash involving mucus membranes or skin necrosis: no Has patient had a PCN reaction that required hospitalization no Has patient had a PCN reaction occurring within the last 10 years: no If all of the above answers are "NO", then may proceed with Cephalosporin use.  . Sulfa Antibiotics     Rash  . Sulfamethoxazole Rash    Review of Systems   Constitutional: Negative for activity change, fatigue, fever and unexpected weight change.  HENT: Negative.        See his dentis every 6 months.  Eyes: Negative.   Respiratory: Negative for shortness of breath.   Cardiovascular: Negative for palpitations and leg swelling.       Exertional substernal chest pressure  Gastrointestinal: Negative.   Endocrine: Negative.   Genitourinary: Positive for frequency.  Musculoskeletal: Negative.   Allergic/Immunologic: Negative.   Neurological: Negative for dizziness and syncope.  Hematological: Negative.   Psychiatric/Behavioral: Negative.     BP 113/70   Pulse 68   Resp 20   Ht 5\' 5"  (1.651 m)   Wt 219 lb (99.3 kg)   SpO2 96% Comment: RA  BMI 36.44 kg/m  Physical Exam  Constitutional: He is oriented to person, place, and time.  Obese gentleman in no distress  HENT:  Head: Normocephalic and atraumatic.  Mouth/Throat: Oropharynx is clear and moist.  Teeth in fair condition  Eyes: EOM are normal. Pupils are equal, round, and reactive to light.  Neck: Normal range of motion. Neck supple. No JVD present. No thyromegaly present.  Cardiovascular: Normal rate, regular rhythm and intact distal pulses.   Murmur heard. 3/6 harsh systolic murmur RSB  Pulmonary/Chest: Effort normal and breath sounds normal. No respiratory distress. He has no wheezes. He has no rales.  Abdominal: Soft. Bowel sounds are normal. There is no tenderness.  Musculoskeletal: Normal range of motion. He exhibits no edema.  Lymphadenopathy:    He has no cervical adenopathy.  Neurological: He is alert and oriented to person, place, and time. He has normal strength. No cranial nerve deficit or sensory deficit.  Skin: Skin is warm and dry.  Psychiatric: He has a normal mood and affect.     Diagnostic Tests:  KAZUKI INGLE  Cardiac catheterization  Order# [id number]  Reading physician: [name], MD Ordering physician: [name], MD Study date: 03/10/16    Physicians   Panel Physicians Referring Physician Case Authorizing Physician  [name], MD (Primary)    Procedures   Right/Left Heart Cath and Coronary Angiography  Conclusion   1. Diffuse nonobstructive CAD 2. Moderately severe aortic stenosis 3. Normal right heart hemodynamics/normal LVEDP  Pt with echo criteria for severe aortic stenosis (mean gradient 42 mmHg, peak velocity 4 m/s) and borderline cath criteria. Reasonable to consider aortic valve replacement as he does have angina at a high workload with no other explanation for symptoms. He wishes to wait several months for surgical referral because of insurance reasons. He is counseled about keeping an eye out for progressive symptoms.  Indications   Severe aortic stenosis [I35.0 (ICD-10-CM)]  Procedural Details/Technique   Technical Details INDICATION: Severe aortic stenosis  PROCEDURAL DETAILS: There was an indwelling IV in a right antecubital vein. Using normal sterile technique, the IV was changed out for a 5 Fr brachial sheath over a 0.018 inch wire. The right wrist was then prepped, draped, and anesthetized with 1% lidocaine. Using the modified Seldinger technique a 5/6 French Slender sheath was placed in the right radial artery. Intra-arterial verapamil was administered through the radial artery sheath. IV heparin was administered after a JR4 catheter was advanced into the central aorta. A Swan-Ganz catheter was used for the right heart catheterization. Standard protocol was followed for recording of right heart pressures and sampling of oxygen saturations. Fick cardiac output was calculated. Standard Judkins catheters were used for selective coronary angiography. Left ventricular pressure was measured with a JR4 catheter after directing a J-tip wire across the aortic valve. A pullback was recorded. There were no immediate procedural complications. The patient was transferred to the post catheterization recovery area for  further monitoring.    Estimated blood loss <50 mL.  During this procedure the patient was administered the following to achieve and maintain moderate conscious sedation: Versed 2 mg, Fentanyl 25 mcg, while the patient's heart rate, blood pressure, and oxygen saturation were continuously monitored. The period of conscious sedation was 32 minutes, of which I was present face-to-face 100% of this time.    Coronary Findings   Dominance: Right  Left Anterior Descending  Dist LAD lesion, 30% stenosed.  First Diagonal Branch  1st Diag lesion, 30% stenosed.  Second Diagonal [employer] 2nd Diag to 2nd Diag lesion, 40% stenosed.  Ramus Intermedius  Ost Ramus to Ramus lesion, 50% stenosed. The lesion is eccentric. 40-50% stenosis, does not appear flow-restrictive  Left Circumflex  Vessel is small. Very small AV circumflex  Right Coronary Artery  Mid RCA lesion, 30% stenosed. The RCA is a dominant vessel wtih a high anterior origin, mild nonobstructive disease noted.  Right Heart   Right Heart Pressures LV EDP is normal.    Left Heart   Aortic Valve There is moderate aortic valve stenosis. The aortic valve is calcified. There is restricted aortic valve motion. Mean aortic valve gradient 29 mmHg, calculated AVA 1.2 square cm    Coronary Diagrams   Diagnostic Diagram       Implants     No implant documentation for this case.  PACS Images   Show images for Cardiac catheterization   Link to Procedure Log   Procedure Log    Hemo Data    Most Recent Value  Fick Cardiac Output 5.71 L/min  Fick Cardiac Output Index 2.8 (L/min)/BSA  Aortic Mean Gradient 28.7 mmHg  Aortic Peak Gradient 29 mmHg  Aortic Valve Area 1.20  Aortic Value Area Index 0.59 cm2/BSA  RA A Wave 10 mmHg  RA V Wave 8 mmHg  RA Mean 8 mmHg  RV Systolic Pressure 29 mmHg  RV Diastolic Pressure 8 mmHg  RV EDP 11 mmHg  PA Systolic Pressure 26 mmHg  PA Diastolic Pressure 13 mmHg  PA Mean 19 mmHg  PW A Wave 6  mmHg  PW V Wave 5 mmHg  PW Mean 3 mmHg  AO Systolic Pressure 108 mmHg  AO Diastolic Pressure 71 mmHg  AO Mean 86 mmHg  LV Systolic Pressure 168 mmHg  LV Diastolic Pressure 6 mmHg  LV EDP 19 mmHg  Arterial Occlusion Pressure Extended Systolic Pressure  123 mmHg  Arterial Occlusion Pressure Extended Diastolic Pressure 70 mmHg  Arterial Occlusion Pressure Extended Mean Pressure 91 mmHg  Left Ventricular Apex Extended Systolic Pressure 152 mmHg  Left Ventricular Apex Extended Diastolic Pressure 7 mmHg  Left Ventricular Apex Extended EDP Pressure 13 mmHg  QP/QS 1  TPVR Index 6.79 HRUI  TSVR Index 30.75 HRUI  PVR SVR Ratio 0.21  TPVR/TSVR Ratio 0.22    *[facility]*                        [address]                        [place], [place] [address]                            [phone]  ------------------------------------------------------------------- Transthoracic Echocardiography  Patient:    Ritchie, Proietti MR #:       [id number] Study Date: 09/13/2016 Gender:     M Age:        81 Height:     165.1 cm Weight:     97.1 kg BSA:        2.15 m^2 Pt. Status: Room:   ATTENDING    [name], MD  ORDERING     [name], MD  REFERRING    [name], MD  SONOGRAPHER  Clearence Ped, RCS  PERFORMING   [facility], Outpatient  cc:  ------------------------------------------------------------------- LV EF: 55% -   60%  ------------------------------------------------------------------- Indications:      Aortic Stenosis (I35.0).  ------------------------------------------------------------------- History:   PMH:  hypertension, OSA.  Atrial fibrillation.  Risk factors:  Diabetes mellitus.  ------------------------------------------------------------------- Study Conclusions  - Left ventricle: The cavity size was mildly dilated. Wall   thickness was increased in a pattern of mild LVH. Systolic   function was normal. The estimated ejection  fraction was in the   range of 55% to 60%. - Aortic valve: Pevious gardients in October 2017 noted higher   gradients mean 48 mmHg and peak of 91 mmHg. Calculared AVA still   severe consider right and left cath and dedicated pedof doppler   exam. There was severe stenosis. There was mild regurgitation.   Valve area (VTI): 0.8 cm^2. Valve area (Vmax): 0.77 cm^2. Valve   area (Vmean): 0.73 cm^2. - Left atrium: The atrium was moderately dilated. - Pulmonary arteries: PA peak pressure: 38 mm Hg (S).  ------------------------------------------------------------------- Study data:  Comparison was made to the study of 01/20/2016.  Study status:  Routine.  Procedure:  The patient reported no pain pre or post test. Transthoracic echocardiography. Image quality was adequate.          Transthoracic echocardiography.  M-mode, complete 2D, spectral Doppler, and color Doppler.  Birthdate: Patient birthdate: 1951/03/11.  Age:  Patient is 66 yr old.  Sex: Gender: male.    BMI: 35.6 kg/m^2.  Blood pressure:     116/68 Patient status:  Outpatient.  Study date:  Study date: 09/13/2016. Study time: 09:34 AM.  Location:  [facility]  -------------------------------------------------------------------  ------------------------------------------------------------------- Left ventricle:  The cavity size was mildly dilated. Wall thickness was increased in a pattern of mild LVH. Systolic function was normal. The estimated ejection fraction was in the range of 55% to 60%.  ------------------------------------------------------------------- Aortic valve:  Pevious gardients in October 2017 noted higher gradients mean 48 mmHg and peak of 91  mmHg. Calculared AVA still severe consider right and left cath and dedicated pedof doppler exam.  Trileaflet; severely thickened, severely calcified leaflets.  Doppler:   There was severe stenosis.   There was mild regurgitation.    VTI ratio of LVOT to aortic  valve: 0.25. Valve area (VTI): 0.8 cm^2. Indexed valve area (VTI): 0.37 cm^2/m^2. Peak velocity ratio of LVOT to aortic valve: 0.25. Valve area (Vmax): 0.77 cm^2. Indexed valve area (Vmax): 0.36 cm^2/m^2. Mean velocity ratio of LVOT to aortic valve: 0.23. Valve area (Vmean): 0.73 cm^2. Indexed valve area (Vmean): 0.34 cm^2/m^2.    Mean gradient (S): 38 mm Hg. Peak gradient (S): 66 mm Hg.  ------------------------------------------------------------------- Mitral valve:   Mildly thickened leaflets .  Doppler:  There was trivial regurgitation.    Peak gradient (D): 3 mm Hg.  ------------------------------------------------------------------- Left atrium:  The atrium was moderately dilated.   ------------------------------------------------------------------- Systemic veins: Inferior vena cava: The vessel was dilated. The respirophasic diameter changes were in the normal range (= 50%), consistent with normal central venous pressure. Diameter: 23 mm.  ------------------------------------------------------------------- Measurements   IVC                                       Value          Reference  ID                                        23    mm       ---------    Left ventricle                            Value          Reference  LV ID, ED, PLAX chordal                   51.7  mm       43 - 52  LV ID, ES, PLAX chordal                   33.7  mm       23 - 38  LV fx shortening, PLAX chordal            35    %        >=29  LV PW thickness, ED                       14.2  mm       ---------  IVS/LV PW ratio, ED                       0.94           <=1.3  Stroke volume, 2D                         77    ml       ---------  Stroke volume/bsa, 2D                     36    ml/m^2   ---------    Ventricular septum  Value          Reference  IVS thickness, ED                         13.4  mm       ---------    LVOT                                      Value           Reference  LVOT ID, S                                20    mm       ---------  LVOT area                                 3.14  cm^2     ---------  LVOT peak velocity, S                     100   cm/s     ---------  LVOT mean velocity, S                     65.7  cm/s     ---------  LVOT VTI, S                               24.5  cm       ---------    Aortic valve                              Value          Reference  Aortic valve peak velocity, S             406   cm/s     ---------  Aortic valve mean velocity, S             283   cm/s     ---------  Aortic valve VTI, S                       96.2  cm       ---------  Aortic mean gradient, S                   38    mm Hg    ---------  Aortic peak gradient, S                   66    mm Hg    ---------  VTI ratio, LVOT/AV                        0.25           ---------  Aortic valve area, VTI                    0.8   cm^2     ---------  Aortic valve area/bsa, VTI                0.37  cm^2/m^2 ---------  Velocity ratio, peak, LVOT/AV             0.25           ---------  Aortic valve area, peak velocity          0.77  cm^2     ---------  Aortic valve area/bsa, peak               0.36  cm^2/m^2 ---------  velocity  Velocity ratio, mean, LVOT/AV             0.23           ---------  Aortic valve area, mean velocity          0.73  cm^2     ---------  Aortic valve area/bsa, mean               0.34  cm^2/m^2 ---------  velocity  Aortic regurg pressure half-time          487   ms       ---------    Aorta                                     Value          Reference  Aortic root ID, ED                        35    mm       ---------    Left atrium                               Value          Reference  LA ID, A-P, ES                            44    mm       ---------  LA ID/bsa, A-P                            2.05  cm/m^2   <=2.2  LA volume, S                              72.5  ml       ---------  LA volume/bsa, S                           33.7  ml/m^2   ---------  LA volume, ES, 1-p A4C                    78    ml       ---------  LA volume/bsa, ES, 1-p A4C                36.3  ml/m^2   ---------  LA volume, ES, 1-p A2C                    64.1  ml       ---------  LA volume/bsa, ES, 1-p A2C                29.8  ml/m^2   ---------  Mitral valve                              Value          Reference  Mitral E-wave peak velocity               89.7  cm/s     ---------  Mitral A-wave peak velocity               69.4  cm/s     ---------  Mitral deceleration time          (L)     144   ms       150 - 230  Mitral peak gradient, D                   3     mm Hg    ---------  Mitral E/A ratio, peak                    1.3            ---------    Pulmonary arteries                        Value          Reference  PA pressure, S, DP                (H)     38    mm Hg    <=30    Tricuspid valve                           Value          Reference  Tricuspid regurg peak velocity            296   cm/s     ---------  Tricuspid peak RV-RA gradient             35    mm Hg    ---------  Tricuspid maximal regurg                  296   cm/s     ---------  velocity, PISA    Systemic veins                            Value          Reference  Estimated CVP                             3     mm Hg    ---------    Right ventricle                           Value          Reference  RV pressure, S, DP                (H)     38    mm Hg    <=30  Legend: (L)  and  (H)  mark values outside specified reference range.  ------------------------------------------------------------------- Prepared and Electronically Authenticated by  Charlton Haws, M.D. 2018-06-04T11:25:38   Impression:  This 66 year old gentleman has stage D, severe,  symptomatic aortic stenosis with his only symptom being substernal chest pressure with heavy exertion, NYHA class 1. I have personally reviewed his serial echos and cath images. His echos show progressive AS with a  mean gradient of 48 mm Hg in 01/2016. His most recent echo this week shows a mean gradient of 38 mm Hg but his aortic valve is trileaflet, severely calcified and poorly mobile consistent with severe AS. I think AVR is indicated in this patient who will soon become more symptomatic. I don't think he is a TAVR candidate given his low risk for open surgical AVR. He does have a history of PAF but all of the ECG's that I have seen in EPIC show sinus rhythm and he denies any palpitations. I don't think I would perform a MAZE in this patient given this history and his obesity which makes exposure for performing a MAZE more difficult and probably less complete. I would plan to ligate the LAA. I discussed the choices for valve replacement prosthesis including mechanical and bioprosthetic valves. Given his age of 43 I think a bioprosthetic valve would be best so that he does not have to go on Coumadin for the rest of his life and could be maintained on Eliquis for his atrial fib history as long as he tolerates it. His risk for structural valve deterioration is low.   I discussed the operative procedure with the patient and his wife including alternatives, benefits and risks; including but not limited to bleeding, blood transfusion, infection, stroke, myocardial infarction, heart block requiring a permanent pacemaker, organ dysfunction, and death.  [name] understands and agrees to proceed.    Plan:  AVR using a bioprosthetic valve and clipping of the left atrial appendage on 10/04/2016.   I spent 60 minutes performing this consultation and > 50% of this time was spent face to face counseling and coordinating the care of this patient's severe aortic stenosis.  [name], MD [facility] [phone]

## 2016-09-23 ENCOUNTER — Other Ambulatory Visit: Payer: Self-pay | Admitting: Surgery

## 2016-09-24 ENCOUNTER — Other Ambulatory Visit (HOSPITAL_COMMUNITY): Payer: BLUE CROSS/BLUE SHIELD

## 2016-09-29 NOTE — Pre-Procedure Instructions (Signed)
Hurschel Paynter Wall  09/29/2016      [id number] - [place], [place] - [address] [place] [address] Phone: [phone] Fax: [phone]    Your procedure is scheduled on 10/04/16.  Report to [facility] Admitting at 530 A.M.  Call this number if you have problems the morning of surgery:  [phone]   Remember:  Do not eat food or drink liquids after midnight.  Take these medicines the morning of surgery with A SIP OF WATER     cartia xt  STOP all herbel meds, nsaids (aleve,naproxen,advil,ibuprofen)  prior to surgery  Starting today 09/30/16 including all vitamins/supplements  No metformin day of surgery  Stop eliquis per dr   How to Manage Your Diabetes Before and After Surgery  Why is it important to control my blood sugar before and after surgery? . Improving blood sugar levels before and after surgery helps healing and can limit problems. . A way of improving blood sugar control is eating a healthy diet by: o  Eating less sugar and carbohydrates o  Increasing activity/exercise o  Talking with your doctor about reaching your blood sugar goals . High blood sugars (greater than 180 mg/dL) can raise your risk of infections and slow your recovery, so you will need to focus on controlling your diabetes during the weeks before surgery. . Make sure that the doctor who takes care of your diabetes knows about your planned surgery including the date and location.  How do I manage my blood sugar before surgery? . Check your blood sugar at least 4 times a day, starting 2 days before surgery, to make sure that the level is not too high or low. o Check your blood sugar the morning of your surgery when you wake up and every 2 hours until you get to the Short Stay unit. . If your blood sugar is less than 70 mg/dL, you will need to treat for low blood sugar: o Do not take insulin. o Treat a low  blood sugar (less than 70 mg/dL) with  cup of clear juice (cranberry or apple), 4 glucose tablets, OR glucose gel. o Recheck blood sugar in 15 minutes after treatment (to make sure it is greater than 70 mg/dL). If your blood sugar is not greater than 70 mg/dL on recheck, call [phone] for further instructions. . Report your blood sugar to the short stay nurse when you get to Short Stay.  . If you are admitted to the hospital after surgery: o Your blood sugar will be checked by the staff and you will probably be given insulin after surgery (instead of oral diabetes medicines) to make sure you have good blood sugar levels. o The goal for blood sugar control after surgery is 80-180 mg/dL.   WHAT DO I DO ABOUT MY DIABETES MEDICATION?   Marland Kitchen Do not take oral diabetes medicines (pills) the morning of surgery.(no metformin)  THE NIGHT BEFORE SURGERY, take 25 units of  toujeo insulin.       . THE MORNING OF SURGERY, take 25 units of toujeo insulin.    Do not wear jewelry, make-up or nail polish.  Do not wear lotions, powders, or perfumes, or deoderant.  Do not shave 48 hours prior to surgery.  Men may shave face and neck.  Do not bring valuables to the hospital.  [facility] is not responsible for any  belongings or valuables.  Contacts, dentures or bridgework may not be worn into surgery.  Leave your suitcase in the car.  After surgery it may be brought to your room.  For patients admitted to the hospital, discharge time will be determined by your treatment team.  Patients discharged the day of surgery will not be allowed to drive home.   Special instructions:   Special Instructions: [facility] - Preparing for Surgery  Before surgery, you can play an important role.  Because skin is not sterile, your skin needs to be as free of germs as possible.  You can reduce the number of germs on you skin by washing with CHG (chlorahexidine gluconate) soap before surgery.  CHG is an antiseptic  cleaner which kills germs and bonds with the skin to continue killing germs even after washing.  Please DO NOT use if you have an allergy to CHG or antibacterial soaps.  If your skin becomes reddened/irritated stop using the CHG and inform your nurse when you arrive at Short Stay.  Do not shave (including legs and underarms) for at least 48 hours prior to the first CHG shower.  You may shave your face.  Please follow these instructions carefully:   1.  Shower with CHG Soap the night before surgery and the morning of Surgery.  2.  If you choose to wash your hair, wash your hair first as usual with your normal shampoo.  3.  After you shampoo, rinse your hair and body thoroughly to remove the Shampoo.  4.  Use CHG as you would any other liquid soap.  You can apply chg directly  to the skin and wash gently with scrungie or a clean washcloth.  5.  Apply the CHG Soap to your body ONLY FROM THE NECK DOWN.  Do not use on open wounds or open sores.  Avoid contact with your eyes ears, mouth and genitals (private parts).  Wash genitals (private parts)       with your normal soap.  6.  Wash thoroughly, paying special attention to the area where your surgery will be performed.  7.  Thoroughly rinse your body with warm water from the neck down.  8.  DO NOT shower/wash with your normal soap after using and rinsing off the CHG Soap.  9.  Pat yourself dry with a clean towel.            10.  Wear clean pajamas.            11.  Place clean sheets on your bed the night of your first shower and do not sleep with pets.  Day of Surgery  Do not apply any lotions/deodorants the morning of surgery.  Please wear clean clothes to the hospital/surgery center.  Please read over the  fact sheets that you were given.

## 2016-09-30 ENCOUNTER — Ambulatory Visit (HOSPITAL_COMMUNITY)
Admission: RE | Admit: 2016-09-30 | Discharge: 2016-09-30 | Disposition: A | Discharge status: Home / Self Care | Payer: Medicare Other | Source: Ambulatory Visit | Attending: Surgery | Admitting: Surgery

## 2016-09-30 ENCOUNTER — Ambulatory Visit (HOSPITAL_BASED_OUTPATIENT_CLINIC_OR_DEPARTMENT_OTHER)
Admission: RE | Admit: 2016-09-30 | Discharge: 2016-09-30 | Disposition: A | Discharge status: Home / Self Care | Payer: Medicare Other | Source: Ambulatory Visit | Attending: Surgery | Admitting: Surgery

## 2016-09-30 ENCOUNTER — Encounter (HOSPITAL_COMMUNITY): Payer: Self-pay

## 2016-09-30 ENCOUNTER — Other Ambulatory Visit (HOSPITAL_COMMUNITY): Payer: Medicare Other

## 2016-09-30 ENCOUNTER — Other Ambulatory Visit (HOSPITAL_COMMUNITY): Payer: Self-pay | Admitting: *Deleted

## 2016-09-30 DIAGNOSIS — Z7901 Long term (current) use of anticoagulants: Secondary | ICD-10-CM | POA: Diagnosis not present

## 2016-09-30 DIAGNOSIS — Z79899 Other long term (current) drug therapy: Secondary | ICD-10-CM | POA: Insufficient documentation

## 2016-09-30 DIAGNOSIS — E119 Type 2 diabetes mellitus without complications: Secondary | ICD-10-CM | POA: Diagnosis not present

## 2016-09-30 DIAGNOSIS — I4891 Unspecified atrial fibrillation: Secondary | ICD-10-CM | POA: Insufficient documentation

## 2016-09-30 DIAGNOSIS — Z794 Long term (current) use of insulin: Secondary | ICD-10-CM | POA: Insufficient documentation

## 2016-09-30 DIAGNOSIS — I350 Nonrheumatic aortic (valve) stenosis: Secondary | ICD-10-CM

## 2016-09-30 DIAGNOSIS — Z01818 Encounter for other preprocedural examination: Secondary | ICD-10-CM | POA: Insufficient documentation

## 2016-09-30 HISTORY — DX: Gastro-esophageal reflux disease without esophagitis: K21.9

## 2016-09-30 HISTORY — DX: Cardiac murmur, unspecified: R01.1

## 2016-09-30 HISTORY — DX: Atherosclerotic heart disease of native coronary artery without angina pectoris: I25.10

## 2016-09-30 LAB — PULMONARY FUNCTION TEST
DL/VA % PRED: 98 %
DL/VA: 4.21 ml/min/mmHg/L
DLCO COR % PRED: 91 %
DLCO cor: 23.3 ml/min/mmHg
DLCO unc % pred: 94 %
DLCO unc: 24.12 ml/min/mmHg
FEF 25-75 POST: 4.1 L/s
FEF 25-75 Pre: 2.37 L/sec
FEF2575-%CHANGE-POST: 73 %
FEV1-%Change-Post: 23 %
FEV1-POST: 3.32 L
FEV1-PRE: 2.69 L
FEV1FVC-%Change-Post: 18 %
FEV6-%Change-Post: 3 %
FEV6-Post: 3.85 L
FEV6-Pre: 3.71 L
FVC-%Change-Post: 3 %
FVC-POST: 3.85 L
FVC-PRE: 3.71 L
Post FEV1/FVC ratio: 86 %
Post FEV6/FVC ratio: 100 %
Pre FEV1/FVC ratio: 73 %
Pre FEV6/FVC Ratio: 100 %
RV % PRED: 246 %
RV: 5.11 L
TLC % pred: 147 %
TLC: 8.88 L

## 2016-09-30 LAB — BLOOD GAS, ARTERIAL
Acid-base deficit: 1.2 mmol/L (ref 0.0–2.0)
Bicarbonate: 22.5 mmol/L (ref 20.0–28.0)
Drawn by: 421801
FIO2: 21
O2 Saturation: 90 %
PCO2 ART: 34.2 mmHg (ref 32.0–48.0)
PO2 ART: 62.4 mmHg — AB (ref 83.0–108.0)
Patient temperature: 98.6
pH, Arterial: 7.433 (ref 7.350–7.450)

## 2016-09-30 LAB — CBC
HEMATOCRIT: 44.3 % (ref 39.0–52.0)
Hemoglobin: 14.3 g/dL (ref 13.0–17.0)
MCH: 26.2 pg (ref 26.0–34.0)
MCHC: 32.3 g/dL (ref 30.0–36.0)
MCV: 81.1 fL (ref 78.0–100.0)
Platelets: 202 10*3/uL (ref 150–400)
RBC: 5.46 MIL/uL (ref 4.22–5.81)
RDW: 14.2 % (ref 11.5–15.5)
WBC: 7.5 10*3/uL (ref 4.0–10.5)

## 2016-09-30 LAB — PROTIME-INR
INR: 0.9
Prothrombin Time: 12.1 seconds (ref 11.4–15.2)

## 2016-09-30 LAB — URINALYSIS, ROUTINE W REFLEX MICROSCOPIC
BILIRUBIN URINE: NEGATIVE
Bacteria, UA: NONE SEEN
Glucose, UA: >=500 mg/dL — AB
HGB URINE DIPSTICK: NEGATIVE
KETONES UR: NEGATIVE mg/dL
LEUKOCYTES UA: NEGATIVE
NITRITE: NEGATIVE
PROTEIN: NEGATIVE mg/dL
RBC / HPF: NONE SEEN RBC/hpf (ref 0–5)
Specific Gravity, Urine: 1.024 (ref 1.005–1.030)
Squamous Epithelial / LPF: NONE SEEN
WBC UA: NONE SEEN WBC/hpf (ref 0–5)
pH: 5 (ref 5.0–8.0)

## 2016-09-30 LAB — ABO/RH: ABO/RH(D): AB POS

## 2016-09-30 LAB — SURGICAL PCR SCREEN
MRSA, PCR: NEGATIVE
STAPHYLOCOCCUS AUREUS: NEGATIVE

## 2016-09-30 LAB — APTT: aPTT: 27 seconds (ref 24–36)

## 2016-09-30 LAB — COMPREHENSIVE METABOLIC PANEL
ALBUMIN: 4.2 g/dL (ref 3.5–5.0)
ALK PHOS: 46 U/L (ref 38–126)
ALT: 23 U/L (ref 17–63)
ANION GAP: 11 (ref 5–15)
AST: 25 U/L (ref 15–41)
BILIRUBIN TOTAL: 0.7 mg/dL (ref 0.3–1.2)
BUN: 18 mg/dL (ref 6–20)
CALCIUM: 9.2 mg/dL (ref 8.9–10.3)
CO2: 21 mmol/L — ABNORMAL LOW (ref 22–32)
CREATININE: 0.93 mg/dL (ref 0.61–1.24)
Chloride: 104 mmol/L (ref 101–111)
GFR calc Af Amer: >60 mL/min (ref >60)
GFR calc non Af Amer: >60 mL/min (ref >60)
GLUCOSE: 276 mg/dL — AB (ref 65–99)
Potassium: 4.7 mmol/L (ref 3.5–5.1)
Sodium: 136 mmol/L (ref 135–145)
TOTAL PROTEIN: 6.8 g/dL (ref 6.5–8.1)

## 2016-09-30 LAB — VAS US DOPPLER PRE CABG
LCCADDIAS: -29 cm/s
LCCAPDIAS: 25 cm/s
LEFT ECA DIAS: -16 cm/s
LEFT VERTEBRAL DIAS: -17 cm/s
LICADDIAS: -30 cm/s
LICADSYS: -77 cm/s
LICAPDIAS: -24 cm/s
LICAPSYS: -88 cm/s
Left CCA dist sys: -102 cm/s
Left CCA prox sys: 114 cm/s
RIGHT ECA DIAS: -14 cm/s
RIGHT VERTEBRAL DIAS: 11 cm/s
Right CCA prox dias: 10 cm/s
Right CCA prox sys: 82 cm/s
Right cca dist sys: -76 cm/s

## 2016-09-30 LAB — TYPE AND SCREEN
ABO/RH(D): AB POS
Antibody Screen: NEGATIVE

## 2016-09-30 LAB — GLUCOSE, CAPILLARY: Glucose-Capillary: 349 mg/dL — ABNORMAL HIGH (ref 65–99)

## 2016-09-30 IMAGING — CR DG CHEST 2V
2 series · 2 of 2 positions shown · non-contrast
Comparison: Radiographs [DATE].

CLINICAL DATA: Severe aortic stenosis.  Preop for surgery.

EXAM:
CHEST  2 VIEW

[w chest pa]
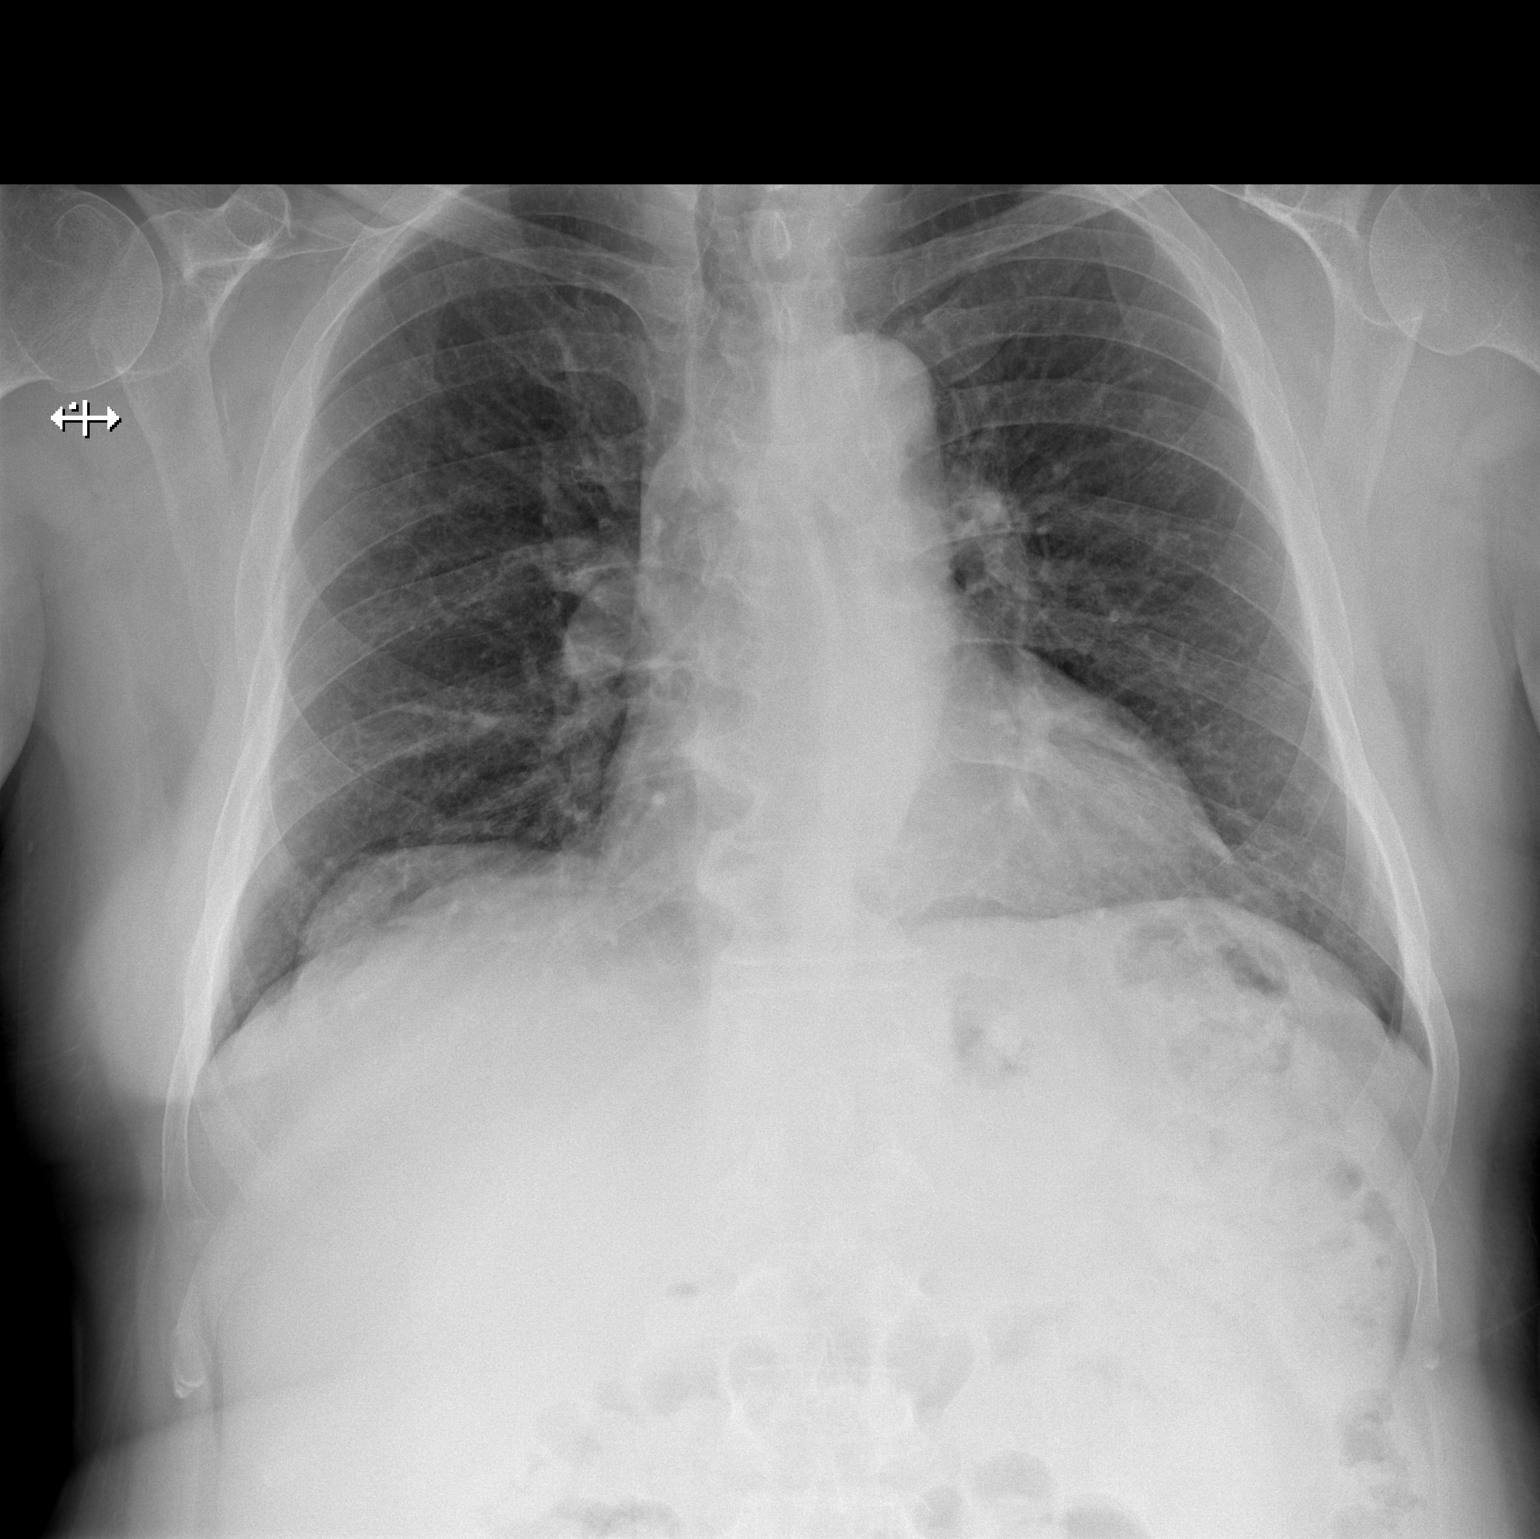

[w chest lat]
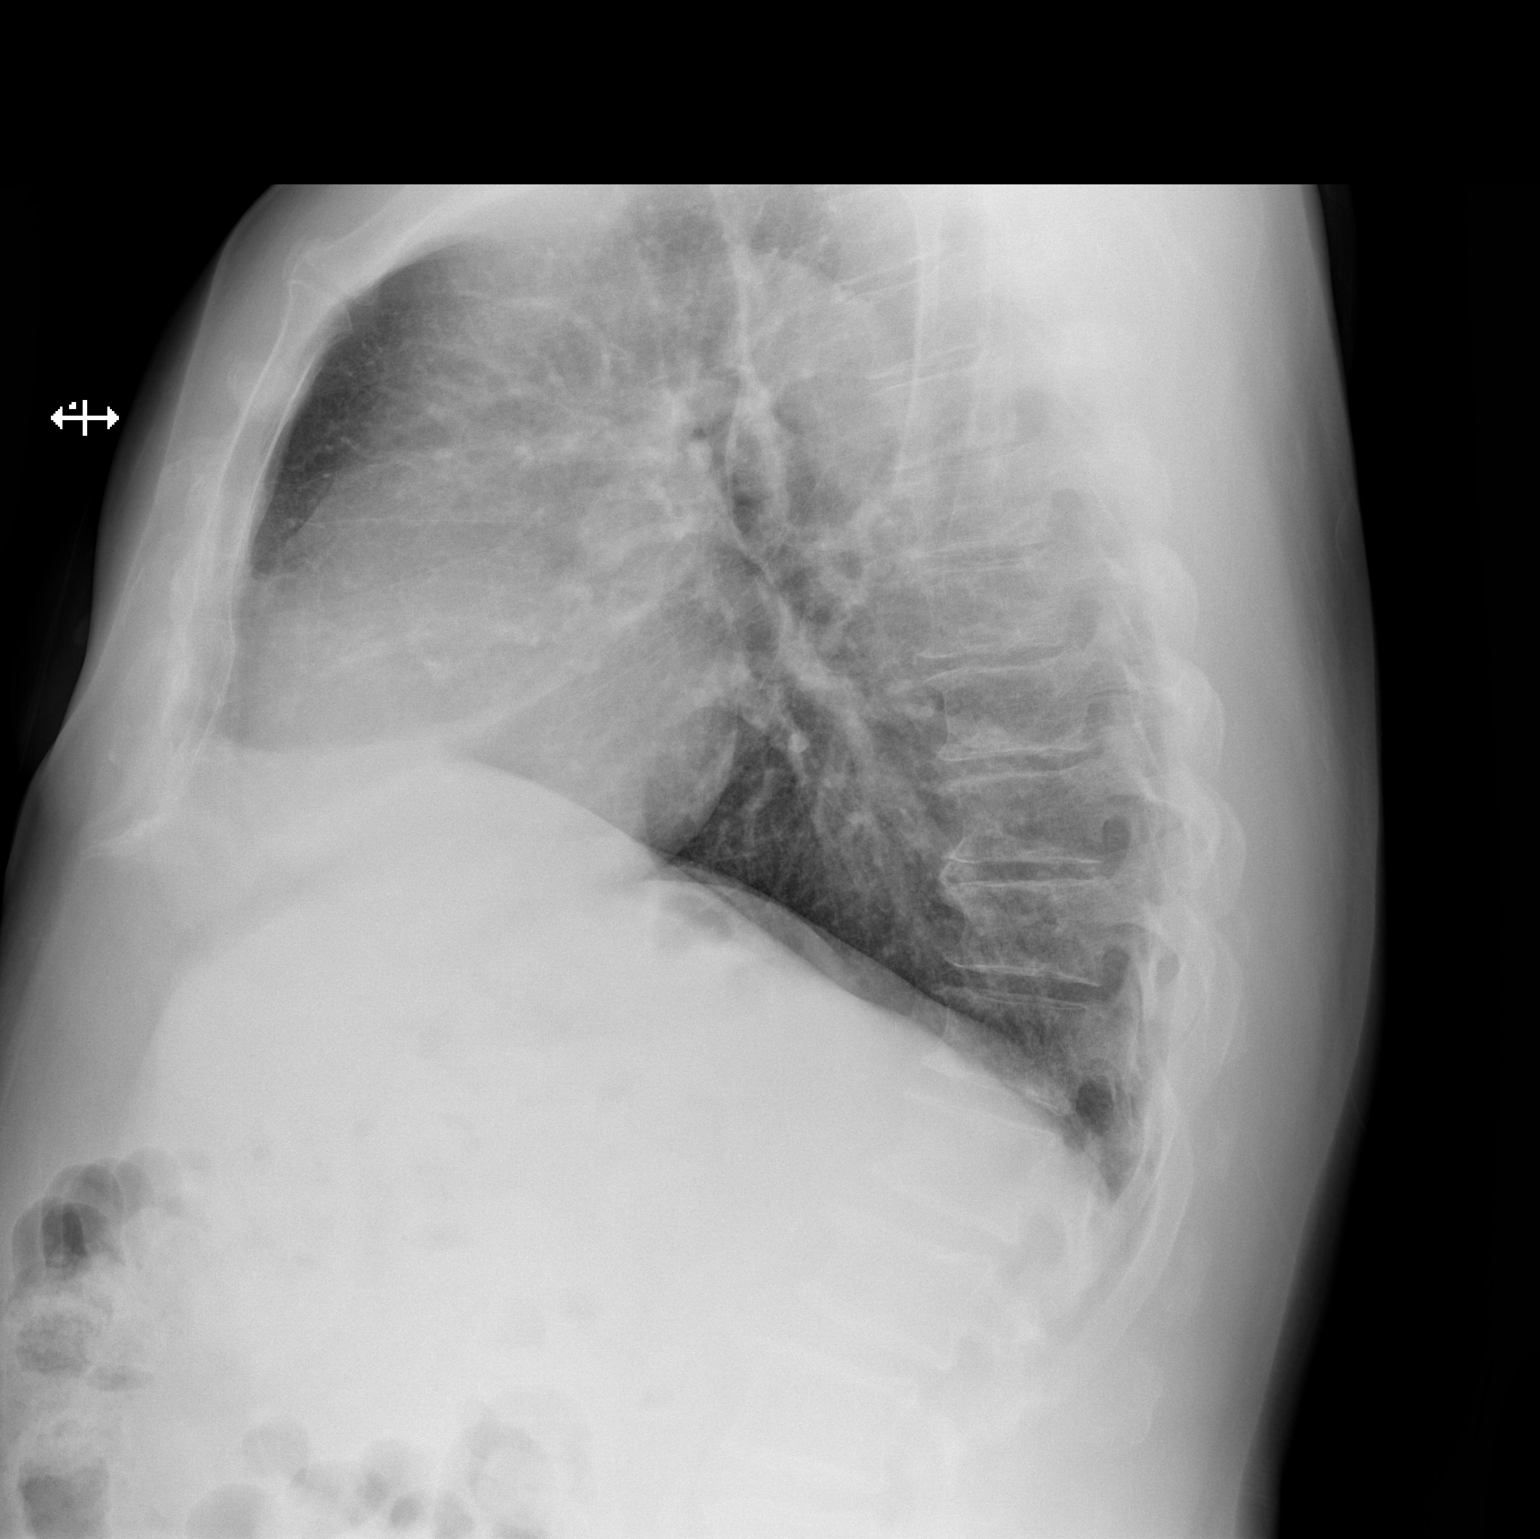

[2 of 2 positions shown; findings below may reference images not displayed]

FINDINGS: The heart size and mediastinal contours are within normal limits.
Both lungs are clear. The visualized skeletal structures are
unremarkable.
IMPRESSION: No active cardiopulmonary disease.

## 2016-09-30 MED ORDER — ALBUTEROL SULFATE (2.5 MG/3ML) 0.083% IN NEBU
2.5000 mg | INHALATION_SOLUTION | Freq: Once | RESPIRATORY_TRACT | Status: AC
Start: 2016-09-30 — End: 2016-09-30
  Administered 2016-09-30: 2.5 mg via RESPIRATORY_TRACT

## 2016-09-30 NOTE — Progress Notes (Signed)
Pre-op Cardiac Surgery  Carotid Findings:  Mild plaque with 1-39% stenosis. Vertebral arteries showed antegrade flow.   Upper Extremity Right Left  Brachial Pressures Triphasic - 137 Triphasic  -136  Radial Waveforms Triphasic  Triphasic  Ulnar Waveforms Triphasic Triphasic  Palmar Arch (Allen's Test) Abnormal WNL   Findings:  Right radial artery decreased greater than 50 percent with compression. Right ulnar within normal limits. Left WNL with compression.   Marilynne Halsted, BS, RDMS, RVT

## 2016-09-30 NOTE — Pre-Procedure Instructions (Addendum)
Vernon Ruiz  09/30/2016    Your procedure is scheduled on Monday, October 04, 2016 at 7:30 AM.   Report to [facility] Entrance "A" Admitting Office at 5:30 AM.   Call this number if you have problems the morning of surgery: [phone]   Questions prior to day of surgery, please call [phone] between 8 & 4 PM.   Remember:  Do not eat food or drink liquids after midnight Sunday, 10/03/16.  Take these medicines the morning of surgery with A SIP OF WATER: Cartia XT  Stop Vitamins as of today. Do not use Aspirin products or NSAIDS (Ibuprofen, Aleve, etc) prior to surgery.      Stop Eliquis as instructed by physician.   How to Manage Your Diabetes Before and After Surgery  Why is it important to control my blood sugar before and after surgery? . Improving blood sugar levels before and after surgery helps healing and can limit problems. . A way of improving blood sugar control is eating a healthy diet by: o  Eating less sugar and carbohydrates o  Increasing activity/exercise o  Talking with your doctor about reaching your blood sugar goals . High blood sugars (greater than 180 mg/dL) can raise your risk of infections and slow your recovery, so you will need to focus on controlling your diabetes during the weeks before surgery. . Make sure that the doctor who takes care of your diabetes knows about your planned surgery including the date and location.  How do I manage my blood sugar before surgery? . Check your blood sugar at least 4 times a day, starting 2 days before surgery, to make sure that the level is not too high or low. o Check your blood sugar the morning of your surgery when you wake up and every 2 hours until you get to the Short Stay unit. . If your blood sugar is less than 70 mg/dL, you will need to treat for low blood sugar: o Do not take insulin. o Treat a low blood sugar (less than 70 mg/dL) with  cup of clear juice (cranberry or apple), 4 glucose  tablets, OR glucose gel. o Recheck blood sugar in 15 minutes after treatment (to make sure it is greater than 70 mg/dL). If your blood sugar is not greater than 70 mg/dL on recheck, call [phone] for further instructions. . Report your blood sugar to the short stay nurse when you get to Short Stay.  . If you are admitted to the hospital after surgery: o Your blood sugar will be checked by the staff and you will probably be given insulin after surgery (instead of oral diabetes medicines) to make sure you have good blood sugar levels. o The goal for blood sugar control after surgery is 80-180 mg/dL.   WHAT DO I DO ABOUT MY DIABETES MEDICATION?   Marland Kitchen Do not take oral diabetes medicines (Metformin) the morning of surgery.  THE NIGHT BEFORE SURGERY, take 25 units of  toujeo insulin.        Do not wear jewelry.  Do not wear lotions, powders, cologne or deodorant.  Do not shave 48 hours prior to surgery.  Men may shave face and neck.  Do not bring valuables to the hospital.  [facility] is not responsible for any belongings or valuables.  Contacts, dentures or bridgework may not be worn into surgery.  Leave your suitcase in the car.  After surgery it may be brought to your room.  For  patients admitted to the hospital, discharge time will be determined by your treatment team.   [facility] - Preparing for Surgery  Before surgery, you can play an important role.  Because skin is not sterile, your skin needs to be as free of germs as possible.  You can reduce the number of germs on you skin by washing with CHG (chlorahexidine gluconate) soap before surgery.  CHG is an antiseptic cleaner which kills germs and bonds with the skin to continue killing germs even after washing.  Please DO NOT use if you have an allergy to CHG or antibacterial soaps.  If your skin becomes reddened/irritated stop using the CHG and inform your nurse when you arrive at Short Stay.  Do not shave (including legs and  underarms) for at least 48 hours prior to the first CHG shower.  You may shave your face.  Please follow these instructions carefully:   1.  Shower with CHG Soap the night before surgery and the morning of Surgery.  2.  If you choose to wash your hair, wash your hair first as usual with your normal shampoo.  3.  After you shampoo, rinse your hair and body thoroughly to remove the Shampoo.  4.  Use CHG as you would any other liquid soap.  You can apply chg directly  to the skin and wash gently with scrungie or a clean washcloth.  5.  Apply the CHG Soap to your body ONLY FROM THE NECK DOWN.  Do not use on open wounds or open sores.  Avoid contact with your eyes ears, mouth and genitals (private parts).  Wash genitals (private parts) with your normal soap.  6.  Wash thoroughly, paying special attention to the area where your surgery will be performed.  7.  Thoroughly rinse your body with warm water from the neck down.  8.  DO NOT shower/wash with your normal soap after using and rinsing off the CHG Soap.  9.  Pat yourself dry with a clean towel.            10.  Wear clean pajamas.            11.  Place clean sheets on your bed the night of your first shower and do not sleep with pets.  Day of Surgery  Do not apply any lotions/deodorants the morning of surgery.  Please wear clean clothes to the hospital.  Please read over the  fact sheets that you were given.

## 2016-09-30 NOTE — Progress Notes (Signed)
Pt has hx of A-fib and aortic stenosis. Pt is type 2 diabetic. Pt's CBG today was 349, states he ate breakfast around 8:30 AM consisting of eggs, sausage and green peppers. He did admit that last night his brother brought him [place] from [place] and he ate several. He states he will not be doing that again. He states his last A1C was 8.1 two months ago. Pt states his fasting blood sugar is usually between 98-179. Pt was instructed to stop Eliquis 4 days prior to surgery, but his last day was 09/26/16.

## 2016-10-01 LAB — HEMOGLOBIN A1C
HEMOGLOBIN A1C: 9.5 % — AB (ref 4.8–5.6)
MEAN PLASMA GLUCOSE: 226 mg/dL

## 2016-10-01 NOTE — Progress Notes (Addendum)
Anesthesia Chart Review: Patient is a 66 year old male scheduled for AVR, clipping of atrial appendage on 10/04/16 by Dr. Laneta Simmers.  History includes recent former smoker (quite 09/16/16), DM2, afib/PAF, severe AS, CAD (non-obstructive 02/2016), HTN, GERD, severe OSA (by 03/08/16 study), appendectomy, hernia repair. BMI is consistent with obesity.  PCP is Dr. Donia Guiles. Endocrinologist is Dr. Margaretmary Bayley.  Cardiologist is Dr. [name]. (Has also seen Dr. Sharyn Lull in the past.) EP cardiologist is Dr. Hillis Range. Pulmonologist is Dr. Coralyn Helling.  Meds include Eliquis (on hold), Cartia XT,Toujeo, losartan, metformin, Trulicity.  EKG 09/30/16: NSR, T wave abnormality, consider lateral ischemia.  Echo 09/13/16: Study Conclusions - Left ventricle: The cavity size was mildly dilated. Wall   thickness was increased in a pattern of mild LVH. Systolic   function was normal. The estimated ejection fraction was in the   range of 55% to 60%. - Aortic valve: Pevious gardients in October 2017 noted higher   gradients mean 48 mmHg and peak of 91 mmHg. Calculared AVA still   severe consider right and left cath and dedicated pedof doppler   exam. There was severe stenosis. There was mild regurgitation.   Valve area (VTI): 0.8 cm^2. Valve area (Vmax): 0.77 cm^2. Valve   area (Vmean): 0.73 cm^2. - Left atrium: The atrium was moderately dilated. - Pulmonary arteries: PA peak pressure: 38 mm Hg (S).  Cardiac cath 03/10/16 (Dr. Excell Seltzer): 1. Diffuse nonobstructive CAD (30% distal LAD, 30% DIAG1, 40% ostial DIAG2, 50% not flow restrictive ostial ramus, 30% mid RCA, small LCx, very small AV CX) 2. Moderately severe aortic stenosis 3. Normal right heart hemodynamics/normal LVEDP Pt with echo criteria for severe aortic stenosis (mean gradient 42 mmHg, peak velocity 4 m/s) and borderline cath criteria. Reasonable to consider aortic valve replacement as he does have angina at a high workload with no other  explanation for symptoms. He wishes to wait several months for surgical referral because of insurance reasons. He is counseled about keeping an eye out for progressive symptoms.   Carotid U/S 09/30/16:   Mild plaque in bilateral carotid artery suggestive of 1-39% stenosis.  CXR 09/30/16: IMPRESSION: No active cardiopulmonary disease.  PFTs 09/30/16: FVC 3.71, FEV1 2.69, DLCO unc 24.12 (94%).  Preoperative labs noted. CBC, PT/PTT WNL. Cr 0.93. Non-fasting glucose 276. A1c 9.5, consistent with average glucose 226 (349 with CBG on arrival). He reported fasting CBGs usually between 98-179.  I have routed A1c result to Dr. Laneta Simmers and TCTS RN Alycia Rossetti. Patient will get a fasting CBG on arrival. (Update 10/01/16 3:41 PM: Covering CT surgeon Dr. Donata Clay will have patient increase his evening Toujeo insulin from 50 to 60 units X the next two evenings.)  Velna [facility] Short Stay Center/Anesthesiology Phone [phone] 10/01/2016 1:13 PM

## 2016-10-03 MED ORDER — POTASSIUM CHLORIDE 2 MEQ/ML IV SOLN
80.0000 meq | INTRAVENOUS | Status: DC
  Filled 2016-10-03: qty 40

## 2016-10-03 MED ORDER — CHLORHEXIDINE GLUCONATE 0.12 % MT SOLN
15.0000 mL | Freq: Once | OROMUCOSAL | Status: AC
  Administered 2016-10-04: 15 mL via OROMUCOSAL
  Filled 2016-10-03: qty 15

## 2016-10-03 MED ORDER — TRANEXAMIC ACID (OHS) PUMP PRIME SOLUTION
2.0000 mg/kg | INTRAVENOUS | Status: DC
  Filled 2016-10-03: qty 1.96

## 2016-10-03 MED ORDER — DOPAMINE-DEXTROSE 3.2-5 MG/ML-% IV SOLN
0.0000 ug/kg/min | INTRAVENOUS | Status: DC
  Filled 2016-10-03: qty 250

## 2016-10-03 MED ORDER — PLASMA-LYTE 148 IV SOLN
INTRAVENOUS | Status: DC
  Filled 2016-10-03: qty 2.5

## 2016-10-03 MED ORDER — EPINEPHRINE PF 1 MG/ML IJ SOLN
0.0000 ug/min | INTRAVENOUS | Status: DC
  Filled 2016-10-03: qty 4

## 2016-10-03 MED ORDER — SODIUM CHLORIDE 0.9 % IV SOLN
INTRAVENOUS | Status: AC
  Administered 2016-10-04: 1 [IU]/h via INTRAVENOUS
  Filled 2016-10-03: qty 1

## 2016-10-03 MED ORDER — NITROGLYCERIN IN D5W 200-5 MCG/ML-% IV SOLN
2.0000 ug/min | INTRAVENOUS | Status: DC
  Filled 2016-10-03: qty 250

## 2016-10-03 MED ORDER — TRANEXAMIC ACID 1000 MG/10ML IV SOLN
1.5000 mg/kg/h | INTRAVENOUS | Status: AC
  Administered 2016-10-04: 15 mg/kg/h via INTRAVENOUS
  Filled 2016-10-03: qty 25

## 2016-10-03 MED ORDER — MAGNESIUM SULFATE 50 % IJ SOLN
40.0000 meq | INTRAMUSCULAR | Status: DC
  Filled 2016-10-03: qty 10

## 2016-10-03 MED ORDER — VANCOMYCIN HCL 10 G IV SOLR
1500.0000 mg | INTRAVENOUS | Status: AC
  Administered 2016-10-04: 1500 mg via INTRAVENOUS
  Filled 2016-10-03: qty 1500

## 2016-10-03 MED ORDER — DEXMEDETOMIDINE HCL IN NACL 400 MCG/100ML IV SOLN
0.1000 ug/kg/h | INTRAVENOUS | Status: AC
  Administered 2016-10-04: .2 ug/kg/h via INTRAVENOUS
  Filled 2016-10-03: qty 100

## 2016-10-03 MED ORDER — LEVOFLOXACIN IN D5W 500 MG/100ML IV SOLN
500.0000 mg | INTRAVENOUS | Status: AC
  Administered 2016-10-04: 500 mg via INTRAVENOUS
  Filled 2016-10-03: qty 100

## 2016-10-03 MED ORDER — METOPROLOL TARTRATE 12.5 MG HALF TABLET
12.5000 mg | ORAL_TABLET | Freq: Once | ORAL | Status: AC
  Administered 2016-10-04: 12.5 mg via ORAL
  Filled 2016-10-03: qty 1

## 2016-10-03 MED ORDER — SODIUM CHLORIDE 0.9 % IV SOLN
30.0000 ug/min | INTRAVENOUS | Status: AC
  Administered 2016-10-04: 25 ug/min via INTRAVENOUS
  Filled 2016-10-03: qty 2

## 2016-10-03 MED ORDER — TRANEXAMIC ACID (OHS) BOLUS VIA INFUSION
15.0000 mg/kg | INTRAVENOUS | Status: DC
  Filled 2016-10-03: qty 1467

## 2016-10-03 MED ORDER — HEPARIN SODIUM (PORCINE) 1000 UNIT/ML IJ SOLN
INTRAMUSCULAR | Status: DC
Start: 2016-10-04 — End: 2016-10-04
  Filled 2016-10-03: qty 30

## 2016-10-03 NOTE — Anesthesia Preprocedure Evaluation (Addendum)
Anesthesia Evaluation  Patient identified by MRN, date of birth, ID band Patient awake    Reviewed: Allergy & Precautions, H&P , NPO status , Patient's Chart, lab work & pertinent test results  Airway Mallampati: II  TM Distance: >3 FB Neck ROM: full    Dental no notable dental hx. (+) Dental Advidsory Given, Teeth Intact   Pulmonary sleep apnea , former smoker,    Pulmonary exam normal breath sounds clear to auscultation       Cardiovascular Exercise Tolerance: Good hypertension, Pt. on medications + CAD  + Valvular Problems/Murmurs AS  Rhythm:Regular Rate:Normal + Systolic murmurs    Neuro/Psych negative neurological ROS  negative psych ROS   GI/Hepatic Neg liver ROS, GERD  Medicated and Controlled,  Endo/Other  diabetes, Oral Hypoglycemic Agents, Insulin DependentMorbid obesity  Renal/GU negative Renal ROS  negative genitourinary   Musculoskeletal   Abdominal   Peds  Hematology negative hematology ROS (+)   Anesthesia Other Findings   Reproductive/Obstetrics negative OB ROS                          Anesthesia Physical Anesthesia Plan  ASA: IV  Anesthesia Plan: General   Post-op Pain Management:    Induction: Intravenous  PONV Risk Score and Plan: Treatment may vary due to age or medical condition  Airway Management Planned: Oral ETT  Additional Equipment: Arterial line, CVP, PA Cath, TEE and Ultrasound Guidance Line Placement  Intra-op Plan:   Post-operative Plan: Post-operative intubation/ventilation  Informed Consent: I have reviewed the patients History and Physical, chart, labs and discussed the procedure including the risks, benefits and alternatives for the proposed anesthesia with the patient or authorized representative who has indicated his/her understanding and acceptance.   Dental advisory given and Dental Advisory Given  Plan Discussed with: CRNA,  Anesthesiologist and Surgeon  Anesthesia Plan Comments:      Anesthesia Quick Evaluation

## 2016-10-04 ENCOUNTER — Inpatient Hospital Stay (HOSPITAL_COMMUNITY): Payer: Medicare Other | Admitting: Anesthesiology

## 2016-10-04 ENCOUNTER — Encounter (HOSPITAL_COMMUNITY)
Admission: RE | Disposition: A | Discharge status: Home / Self Care | Payer: Self-pay | Source: Ambulatory Visit | Attending: Surgery

## 2016-10-04 ENCOUNTER — Inpatient Hospital Stay (HOSPITAL_COMMUNITY)
Admission: RE | Admit: 2016-10-04 | Discharge: 2016-10-09 | DRG: 220 | Disposition: A | Discharge status: Home / Self Care | Payer: Medicare Other | Source: Ambulatory Visit | Attending: Surgery | Admitting: Surgery

## 2016-10-04 ENCOUNTER — Inpatient Hospital Stay (HOSPITAL_COMMUNITY): Payer: Medicare Other

## 2016-10-04 ENCOUNTER — Encounter (HOSPITAL_COMMUNITY): Payer: Self-pay | Admitting: Urology

## 2016-10-04 ENCOUNTER — Inpatient Hospital Stay (HOSPITAL_COMMUNITY): Payer: Medicare Other | Admitting: Vascular Surgery

## 2016-10-04 DIAGNOSIS — Z952 Presence of prosthetic heart valve: Secondary | ICD-10-CM

## 2016-10-04 DIAGNOSIS — Z9119 Patient's noncompliance with other medical treatment and regimen: Secondary | ICD-10-CM | POA: Diagnosis not present

## 2016-10-04 DIAGNOSIS — Z880 Allergy status to penicillin: Secondary | ICD-10-CM

## 2016-10-04 DIAGNOSIS — G4733 Obstructive sleep apnea (adult) (pediatric): Secondary | ICD-10-CM | POA: Diagnosis present

## 2016-10-04 DIAGNOSIS — I481 Persistent atrial fibrillation: Secondary | ICD-10-CM | POA: Diagnosis present

## 2016-10-04 DIAGNOSIS — Z9889 Other specified postprocedural states: Secondary | ICD-10-CM

## 2016-10-04 DIAGNOSIS — I2510 Atherosclerotic heart disease of native coronary artery without angina pectoris: Secondary | ICD-10-CM | POA: Diagnosis present

## 2016-10-04 DIAGNOSIS — Z794 Long term (current) use of insulin: Secondary | ICD-10-CM

## 2016-10-04 DIAGNOSIS — Z9049 Acquired absence of other specified parts of digestive tract: Secondary | ICD-10-CM | POA: Diagnosis not present

## 2016-10-04 DIAGNOSIS — I10 Essential (primary) hypertension: Secondary | ICD-10-CM | POA: Diagnosis present

## 2016-10-04 DIAGNOSIS — I088 Other rheumatic multiple valve diseases: Secondary | ICD-10-CM | POA: Diagnosis not present

## 2016-10-04 DIAGNOSIS — Z7984 Long term (current) use of oral hypoglycemic drugs: Secondary | ICD-10-CM | POA: Diagnosis not present

## 2016-10-04 DIAGNOSIS — Z79899 Other long term (current) drug therapy: Secondary | ICD-10-CM | POA: Diagnosis not present

## 2016-10-04 DIAGNOSIS — E1165 Type 2 diabetes mellitus with hyperglycemia: Secondary | ICD-10-CM | POA: Diagnosis present

## 2016-10-04 DIAGNOSIS — D62 Acute posthemorrhagic anemia: Secondary | ICD-10-CM | POA: Diagnosis not present

## 2016-10-04 DIAGNOSIS — I480 Paroxysmal atrial fibrillation: Secondary | ICD-10-CM | POA: Diagnosis present

## 2016-10-04 DIAGNOSIS — J9811 Atelectasis: Secondary | ICD-10-CM | POA: Diagnosis not present

## 2016-10-04 DIAGNOSIS — E669 Obesity, unspecified: Secondary | ICD-10-CM | POA: Diagnosis present

## 2016-10-04 DIAGNOSIS — Z882 Allergy status to sulfonamides status: Secondary | ICD-10-CM

## 2016-10-04 DIAGNOSIS — F17290 Nicotine dependence, other tobacco product, uncomplicated: Secondary | ICD-10-CM | POA: Diagnosis present

## 2016-10-04 DIAGNOSIS — I358 Other nonrheumatic aortic valve disorders: Secondary | ICD-10-CM | POA: Diagnosis not present

## 2016-10-04 DIAGNOSIS — I350 Nonrheumatic aortic (valve) stenosis: Principal | ICD-10-CM | POA: Diagnosis present

## 2016-10-04 DIAGNOSIS — D696 Thrombocytopenia, unspecified: Secondary | ICD-10-CM | POA: Diagnosis present

## 2016-10-04 DIAGNOSIS — E8770 Fluid overload, unspecified: Secondary | ICD-10-CM | POA: Diagnosis not present

## 2016-10-04 DIAGNOSIS — Z951 Presence of aortocoronary bypass graft: Secondary | ICD-10-CM

## 2016-10-04 DIAGNOSIS — Z6836 Body mass index (BMI) 36.0-36.9, adult: Secondary | ICD-10-CM

## 2016-10-04 HISTORY — PX: CLIPPING OF ATRIAL APPENDAGE: SHX5773

## 2016-10-04 HISTORY — PX: AORTIC VALVE REPLACEMENT: SHX41

## 2016-10-04 HISTORY — PX: TEE WITHOUT CARDIOVERSION: SHX5443

## 2016-10-04 LAB — POCT I-STAT, CHEM 8
BUN: 18 mg/dL (ref 6–20)
BUN: 18 mg/dL (ref 6–20)
BUN: 18 mg/dL (ref 6–20)
BUN: 17 mg/dL (ref 6–20)
BUN: 17 mg/dL (ref 6–20)
BUN: 16 mg/dL (ref 6–20)
CALCIUM ION: 1.2 mmol/L (ref 1.15–1.40)
CHLORIDE: 102 mmol/L (ref 101–111)
CHLORIDE: 104 mmol/L (ref 101–111)
CREATININE: 0.7 mg/dL (ref 0.61–1.24)
CREATININE: 0.6 mg/dL — AB (ref 0.61–1.24)
Calcium, Ion: 1.22 mmol/L (ref 1.15–1.40)
Calcium, Ion: 1.08 mmol/L — ABNORMAL LOW (ref 1.15–1.40)
Calcium, Ion: 1.26 mmol/L (ref 1.15–1.40)
Calcium, Ion: 1.12 mmol/L — ABNORMAL LOW (ref 1.15–1.40)
Calcium, Ion: 1.13 mmol/L — ABNORMAL LOW (ref 1.15–1.40)
Chloride: 103 mmol/L (ref 101–111)
Chloride: 100 mmol/L — ABNORMAL LOW (ref 101–111)
Chloride: 103 mmol/L (ref 101–111)
Chloride: 102 mmol/L (ref 101–111)
Creatinine, Ser: 0.5 mg/dL — ABNORMAL LOW (ref 0.61–1.24)
Creatinine, Ser: 0.6 mg/dL — ABNORMAL LOW (ref 0.61–1.24)
Creatinine, Ser: 0.7 mg/dL (ref 0.61–1.24)
Creatinine, Ser: 0.6 mg/dL — ABNORMAL LOW (ref 0.61–1.24)
GLUCOSE: 212 mg/dL — AB (ref 65–99)
GLUCOSE: 230 mg/dL — AB (ref 65–99)
GLUCOSE: 134 mg/dL — AB (ref 65–99)
Glucose, Bld: 270 mg/dL — ABNORMAL HIGH (ref 65–99)
Glucose, Bld: 215 mg/dL — ABNORMAL HIGH (ref 65–99)
Glucose, Bld: 249 mg/dL — ABNORMAL HIGH (ref 65–99)
HCT: 30 % — ABNORMAL LOW (ref 39.0–52.0)
HCT: 32 % — ABNORMAL LOW (ref 39.0–52.0)
HCT: 29 % — ABNORMAL LOW (ref 39.0–52.0)
HCT: 39 % (ref 39.0–52.0)
HEMATOCRIT: 39 % (ref 39.0–52.0)
HEMATOCRIT: 37 % — AB (ref 39.0–52.0)
Hemoglobin: 13.3 g/dL (ref 13.0–17.0)
Hemoglobin: 10.2 g/dL — ABNORMAL LOW (ref 13.0–17.0)
Hemoglobin: 12.6 g/dL — ABNORMAL LOW (ref 13.0–17.0)
Hemoglobin: 10.9 g/dL — ABNORMAL LOW (ref 13.0–17.0)
Hemoglobin: 9.9 g/dL — ABNORMAL LOW (ref 13.0–17.0)
Hemoglobin: 13.3 g/dL (ref 13.0–17.0)
POTASSIUM: 4.4 mmol/L (ref 3.5–5.1)
POTASSIUM: 4.2 mmol/L (ref 3.5–5.1)
Potassium: 4.3 mmol/L (ref 3.5–5.1)
Potassium: 3.9 mmol/L (ref 3.5–5.1)
Potassium: 4.2 mmol/L (ref 3.5–5.1)
Potassium: 4.1 mmol/L (ref 3.5–5.1)
SODIUM: 137 mmol/L (ref 135–145)
SODIUM: 138 mmol/L (ref 135–145)
Sodium: 137 mmol/L (ref 135–145)
Sodium: 138 mmol/L (ref 135–145)
Sodium: 138 mmol/L (ref 135–145)
Sodium: 139 mmol/L (ref 135–145)
TCO2: 26 mmol/L (ref 0–100)
TCO2: 25 mmol/L (ref 0–100)
TCO2: 28 mmol/L (ref 0–100)
TCO2: 24 mmol/L (ref 0–100)
TCO2: 23 mmol/L (ref 0–100)
TCO2: 24 mmol/L (ref 0–100)

## 2016-10-04 LAB — POCT I-STAT 3, ART BLOOD GAS (G3+)
ACID-BASE DEFICIT: 2 mmol/L (ref 0.0–2.0)
Acid-Base Excess: 3 mmol/L — ABNORMAL HIGH (ref 0.0–2.0)
Acid-base deficit: 2 mmol/L (ref 0.0–2.0)
Acid-base deficit: 3 mmol/L — ABNORMAL HIGH (ref 0.0–2.0)
BICARBONATE: 29.5 mmol/L — AB (ref 20.0–28.0)
Bicarbonate: 24.1 mmol/L (ref 20.0–28.0)
Bicarbonate: 23.2 mmol/L (ref 20.0–28.0)
Bicarbonate: 23.8 mmol/L (ref 20.0–28.0)
O2 SAT: 94 %
O2 SAT: 95 %
O2 SAT: 93 %
O2 Saturation: 100 %
PCO2 ART: 51.8 mmHg — AB (ref 32.0–48.0)
PCO2 ART: 48.5 mmHg — AB (ref 32.0–48.0)
PH ART: 7.364 (ref 7.350–7.450)
TCO2: 31 mmol/L (ref 0–100)
TCO2: 25 mmol/L (ref 0–100)
TCO2: 25 mmol/L (ref 0–100)
TCO2: 25 mmol/L (ref 0–100)
pCO2 arterial: 43.6 mmHg (ref 32.0–48.0)
pCO2 arterial: 42.3 mmHg (ref 32.0–48.0)
pH, Arterial: 7.348 — ABNORMAL LOW (ref 7.350–7.450)
pH, Arterial: 7.347 — ABNORMAL LOW (ref 7.350–7.450)
pH, Arterial: 7.302 — ABNORMAL LOW (ref 7.350–7.450)
pO2, Arterial: 253 mmHg — ABNORMAL HIGH (ref 83.0–108.0)
pO2, Arterial: 71 mmHg — ABNORMAL LOW (ref 83.0–108.0)
pO2, Arterial: 78 mmHg — ABNORMAL LOW (ref 83.0–108.0)
pO2, Arterial: 76 mmHg — ABNORMAL LOW (ref 83.0–108.0)

## 2016-10-04 LAB — GLUCOSE, CAPILLARY
GLUCOSE-CAPILLARY: 122 mg/dL — AB (ref 65–99)
GLUCOSE-CAPILLARY: 81 mg/dL (ref 65–99)
GLUCOSE-CAPILLARY: 101 mg/dL — AB (ref 65–99)
Glucose-Capillary: 268 mg/dL — ABNORMAL HIGH (ref 65–99)
Glucose-Capillary: 89 mg/dL (ref 65–99)
Glucose-Capillary: 130 mg/dL — ABNORMAL HIGH (ref 65–99)
Glucose-Capillary: 151 mg/dL — ABNORMAL HIGH (ref 65–99)
Glucose-Capillary: 113 mg/dL — ABNORMAL HIGH (ref 65–99)

## 2016-10-04 LAB — CBC
HCT: 38.6 % — ABNORMAL LOW (ref 39.0–52.0)
HEMATOCRIT: 37.1 % — AB (ref 39.0–52.0)
HEMOGLOBIN: 13 g/dL (ref 13.0–17.0)
Hemoglobin: 12.3 g/dL — ABNORMAL LOW (ref 13.0–17.0)
MCH: 26.3 pg (ref 26.0–34.0)
MCH: 26.7 pg (ref 26.0–34.0)
MCHC: 33.2 g/dL (ref 30.0–36.0)
MCHC: 33.7 g/dL (ref 30.0–36.0)
MCV: 79.3 fL (ref 78.0–100.0)
MCV: 79.4 fL (ref 78.0–100.0)
PLATELETS: 149 10*3/uL — AB (ref 150–400)
Platelets: 173 10*3/uL (ref 150–400)
RBC: 4.68 MIL/uL (ref 4.22–5.81)
RBC: 4.86 MIL/uL (ref 4.22–5.81)
RDW: 13.8 % (ref 11.5–15.5)
RDW: 13.8 % (ref 11.5–15.5)
WBC: 13.5 10*3/uL — AB (ref 4.0–10.5)
WBC: 15.6 10*3/uL — ABNORMAL HIGH (ref 4.0–10.5)

## 2016-10-04 LAB — POCT I-STAT 4, (NA,K, GLUC, HGB,HCT)
Glucose, Bld: 167 mg/dL — ABNORMAL HIGH (ref 65–99)
HCT: 36 % — ABNORMAL LOW (ref 39.0–52.0)
Hemoglobin: 12.2 g/dL — ABNORMAL LOW (ref 13.0–17.0)
POTASSIUM: 4.1 mmol/L (ref 3.5–5.1)
Sodium: 141 mmol/L (ref 135–145)

## 2016-10-04 LAB — PLATELET COUNT: Platelets: 181 10*3/uL (ref 150–400)

## 2016-10-04 LAB — PROTIME-INR
INR: 1.32
Prothrombin Time: 16.5 seconds — ABNORMAL HIGH (ref 11.4–15.2)

## 2016-10-04 LAB — HEMOGLOBIN AND HEMATOCRIT, BLOOD
HCT: 31.8 % — ABNORMAL LOW (ref 39.0–52.0)
Hemoglobin: 10.6 g/dL — ABNORMAL LOW (ref 13.0–17.0)

## 2016-10-04 LAB — CREATININE, SERUM
CREATININE: 0.83 mg/dL (ref 0.61–1.24)
GFR calc Af Amer: >60 mL/min (ref >60)
GFR calc non Af Amer: >60 mL/min (ref >60)

## 2016-10-04 LAB — APTT: APTT: 38 s — AB (ref 24–36)

## 2016-10-04 LAB — MAGNESIUM: Magnesium: 2.5 mg/dL — ABNORMAL HIGH (ref 1.7–2.4)

## 2016-10-04 IMAGING — DX DG CHEST 1V PORT
1 series · 1 of 1 positions shown · non-contrast
Comparison: [DATE]

CLINICAL DATA: Post aortic valve replacement

EXAM:
PORTABLE CHEST 1 VIEW

[chest ap]
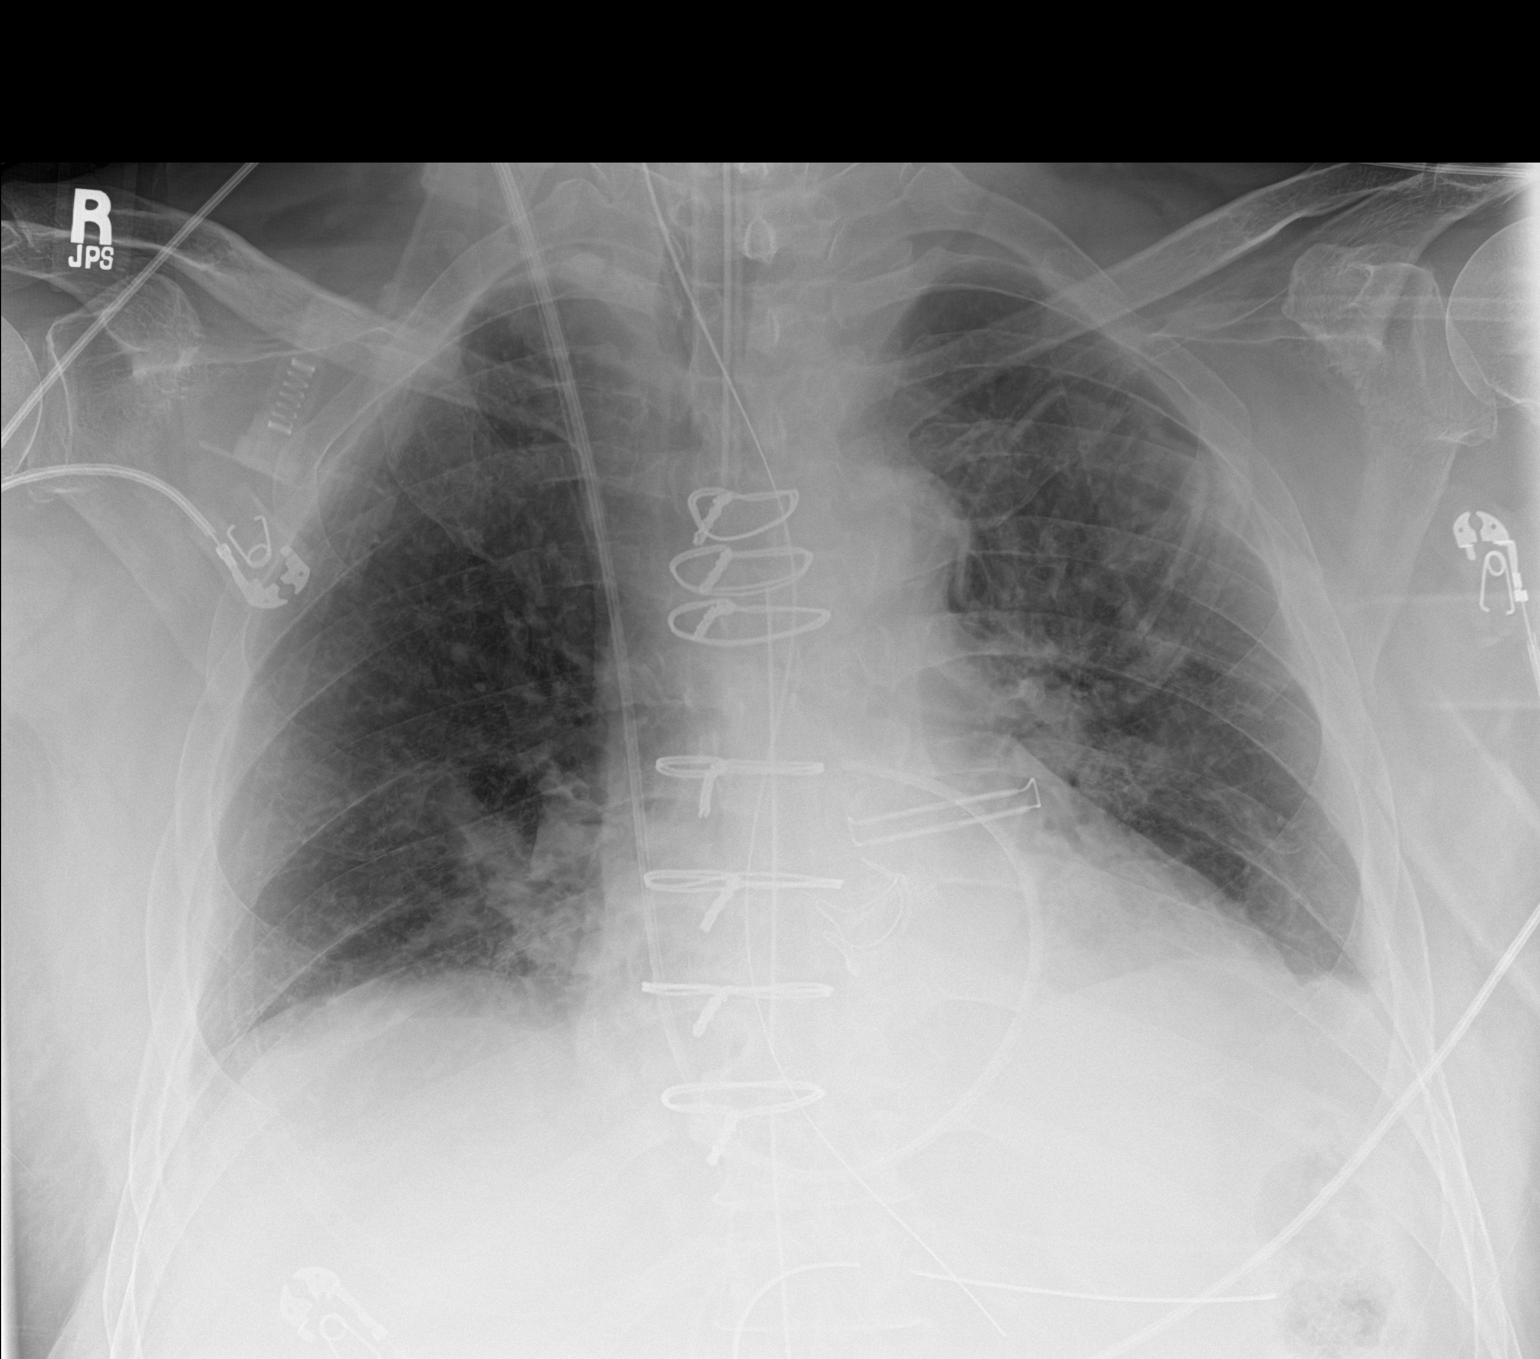

[1 of 1 positions shown; findings below may reference images not displayed]

FINDINGS: Endotracheal tube is 4 cm above the carina. Swan-Ganz catheter tip
is in the main pulmonary artery. NG tube is in the stomach. No
pneumothorax. Low lung volumes with areas of bibasilar and perihilar
atelectasis.
IMPRESSION: Bibasilar and perihilar atelectasis.  No pneumothorax.

## 2016-10-04 SURGERY — REPLACEMENT, AORTIC VALVE, OPEN
Anesthesia: General | Site: Chest

## 2016-10-04 MED ORDER — MORPHINE SULFATE (PF) 4 MG/ML IV SOLN
1.0000 mg | INTRAVENOUS | Status: AC | PRN
  Administered 2016-10-04 (×3): 2 mg via INTRAVENOUS
  Filled 2016-10-04 (×3): qty 1

## 2016-10-04 MED ORDER — LACTATED RINGERS IV SOLN
INTRAVENOUS | Status: DC
  Administered 2016-10-04: 18:00:00 via INTRAVENOUS

## 2016-10-04 MED ORDER — SODIUM CHLORIDE 0.9 % IV SOLN
0.0000 ug/kg/h | INTRAVENOUS | Status: DC
  Filled 2016-10-04 (×2): qty 2

## 2016-10-04 MED ORDER — MORPHINE SULFATE (PF) 2 MG/ML IV SOLN: 1.0000 mg | INTRAVENOUS | Status: DC | PRN

## 2016-10-04 MED ORDER — MIDAZOLAM HCL 5 MG/5ML IJ SOLN
INTRAMUSCULAR | Status: DC | PRN
  Administered 2016-10-04: 4 mg via INTRAVENOUS
  Administered 2016-10-04: 2 mg via INTRAVENOUS
  Administered 2016-10-04: 1 mg via INTRAVENOUS
  Administered 2016-10-04: 2 mg via INTRAVENOUS
  Administered 2016-10-04: 1 mg via INTRAVENOUS

## 2016-10-04 MED ORDER — LACTATED RINGERS IV SOLN: 500.0000 mL | Freq: Once | INTRAVENOUS | Status: DC | PRN

## 2016-10-04 MED ORDER — SODIUM CHLORIDE 0.9 % IV SOLN: 250.0000 mL | INTRAVENOUS | Status: DC

## 2016-10-04 MED ORDER — FENTANYL CITRATE (PF) 250 MCG/5ML IJ SOLN
INTRAMUSCULAR | Status: DC | PRN
  Administered 2016-10-04: 50 ug via INTRAVENOUS
  Administered 2016-10-04: 100 ug via INTRAVENOUS
  Administered 2016-10-04: 700 ug via INTRAVENOUS
  Administered 2016-10-04: 150 ug via INTRAVENOUS
  Administered 2016-10-04: 250 ug via INTRAVENOUS

## 2016-10-04 MED ORDER — MAGNESIUM SULFATE 4 GM/100ML IV SOLN
4.0000 g | Freq: Once | INTRAVENOUS | Status: AC
  Administered 2016-10-04: 4 g via INTRAVENOUS
  Filled 2016-10-04: qty 100

## 2016-10-04 MED ORDER — TRAMADOL HCL 50 MG PO TABS
50.0000 mg | ORAL_TABLET | ORAL | Status: DC | PRN
  Administered 2016-10-06: 100 mg via ORAL
  Filled 2016-10-04: qty 2

## 2016-10-04 MED ORDER — 0.9 % SODIUM CHLORIDE (POUR BTL) OPTIME
TOPICAL | Status: DC | PRN
  Administered 2016-10-04: 6000 mL

## 2016-10-04 MED ORDER — ROCURONIUM BROMIDE 10 MG/ML (PF) SYRINGE
PREFILLED_SYRINGE | INTRAVENOUS | Status: AC
  Filled 2016-10-04: qty 5

## 2016-10-04 MED ORDER — ARTIFICIAL TEARS OPHTHALMIC OINT
TOPICAL_OINTMENT | OPHTHALMIC | Status: DC | PRN
  Administered 2016-10-04: 1 via OPHTHALMIC

## 2016-10-04 MED ORDER — LACTATED RINGERS IV SOLN
INTRAVENOUS | Status: DC | PRN
  Administered 2016-10-04: 07:00:00 via INTRAVENOUS

## 2016-10-04 MED ORDER — ROCURONIUM BROMIDE 10 MG/ML (PF) SYRINGE
PREFILLED_SYRINGE | INTRAVENOUS | Status: DC | PRN
  Administered 2016-10-04: 50 mg via INTRAVENOUS
  Administered 2016-10-04: 70 mg via INTRAVENOUS
  Administered 2016-10-04: 50 mg via INTRAVENOUS
  Administered 2016-10-04: 30 mg via INTRAVENOUS

## 2016-10-04 MED ORDER — METOPROLOL TARTRATE 12.5 MG HALF TABLET
12.5000 mg | ORAL_TABLET | Freq: Two times a day (BID) | ORAL | Status: DC
  Administered 2016-10-04: 12.5 mg via ORAL
  Filled 2016-10-04: qty 1

## 2016-10-04 MED ORDER — PANTOPRAZOLE SODIUM 40 MG PO TBEC
40.0000 mg | DELAYED_RELEASE_TABLET | Freq: Every day | ORAL | Status: DC
  Administered 2016-10-06 – 2016-10-07 (×2): 40 mg via ORAL
  Filled 2016-10-04 (×2): qty 1

## 2016-10-04 MED ORDER — PROPOFOL 10 MG/ML IV BOLUS
INTRAVENOUS | Status: AC
  Filled 2016-10-04: qty 20

## 2016-10-04 MED ORDER — CHLORHEXIDINE GLUCONATE 4 % EX LIQD: 30.0000 mL | CUTANEOUS | Status: DC

## 2016-10-04 MED ORDER — SODIUM CHLORIDE 0.9 % IV SOLN
0.0000 ug/min | INTRAVENOUS | Status: DC
  Administered 2016-10-05: 30 ug/min via INTRAVENOUS
  Filled 2016-10-04 (×2): qty 2

## 2016-10-04 MED ORDER — ACETAMINOPHEN 160 MG/5ML PO SOLN: 1000.0000 mg | Freq: Four times a day (QID) | ORAL | Status: DC

## 2016-10-04 MED ORDER — SODIUM CHLORIDE 0.9 % IV SOLN
INTRAVENOUS | Status: DC
  Administered 2016-10-04: 13:00:00 via INTRAVENOUS

## 2016-10-04 MED ORDER — POTASSIUM CHLORIDE 10 MEQ/50ML IV SOLN
10.0000 meq | INTRAVENOUS | Status: AC
Start: 2016-10-04 — End: 2016-10-04

## 2016-10-04 MED ORDER — METOPROLOL TARTRATE 25 MG/10 ML ORAL SUSPENSION: 12.5000 mg | Freq: Two times a day (BID) | ORAL | Status: DC

## 2016-10-04 MED ORDER — SUCCINYLCHOLINE CHLORIDE 200 MG/10ML IV SOSY
PREFILLED_SYRINGE | INTRAVENOUS | Status: AC
  Filled 2016-10-04: qty 10

## 2016-10-04 MED ORDER — ROCURONIUM BROMIDE 10 MG/ML (PF) SYRINGE
PREFILLED_SYRINGE | INTRAVENOUS | Status: AC
  Filled 2016-10-04: qty 10

## 2016-10-04 MED ORDER — BISACODYL 10 MG RE SUPP: 10.0000 mg | Freq: Every day | RECTAL | Status: DC

## 2016-10-04 MED ORDER — PROPOFOL 10 MG/ML IV BOLUS
INTRAVENOUS | Status: DC | PRN
  Administered 2016-10-04: 50 mg via INTRAVENOUS

## 2016-10-04 MED ORDER — MORPHINE SULFATE (PF) 2 MG/ML IV SOLN: 2.0000 mg | INTRAVENOUS | Status: DC | PRN

## 2016-10-04 MED ORDER — ALBUMIN HUMAN 5 % IV SOLN
250.0000 mL | INTRAVENOUS | Status: AC | PRN
  Administered 2016-10-04: 250 mL via INTRAVENOUS

## 2016-10-04 MED ORDER — NITROGLYCERIN IN D5W 200-5 MCG/ML-% IV SOLN: 0.0000 ug/min | INTRAVENOUS | Status: DC

## 2016-10-04 MED ORDER — METOPROLOL TARTRATE 5 MG/5ML IV SOLN: 2.5000 mg | INTRAVENOUS | Status: DC | PRN

## 2016-10-04 MED ORDER — THROMBIN 20000 UNITS EX SOLR
OROMUCOSAL | Status: DC | PRN
  Administered 2016-10-04 (×3): via TOPICAL

## 2016-10-04 MED ORDER — BISACODYL 5 MG PO TBEC
10.0000 mg | DELAYED_RELEASE_TABLET | Freq: Every day | ORAL | Status: DC
  Administered 2016-10-05 – 2016-10-07 (×3): 10 mg via ORAL
  Filled 2016-10-04 (×3): qty 2

## 2016-10-04 MED ORDER — ALBUMIN HUMAN 5 % IV SOLN
INTRAVENOUS | Status: DC | PRN
  Administered 2016-10-04: 11:00:00 via INTRAVENOUS

## 2016-10-04 MED ORDER — SODIUM CHLORIDE 0.9% FLUSH: 3.0000 mL | INTRAVENOUS | Status: DC | PRN

## 2016-10-04 MED ORDER — SODIUM CHLORIDE 0.9% FLUSH
3.0000 mL | Freq: Two times a day (BID) | INTRAVENOUS | Status: DC
  Administered 2016-10-05 – 2016-10-07 (×5): 3 mL via INTRAVENOUS

## 2016-10-04 MED ORDER — SODIUM CHLORIDE 0.45 % IV SOLN: INTRAVENOUS | Status: DC | PRN

## 2016-10-04 MED ORDER — VANCOMYCIN HCL IN DEXTROSE 1-5 GM/200ML-% IV SOLN
1000.0000 mg | Freq: Once | INTRAVENOUS | Status: AC
  Administered 2016-10-04: 1000 mg via INTRAVENOUS
  Filled 2016-10-04: qty 200

## 2016-10-04 MED ORDER — SODIUM CHLORIDE 0.9 % IV SOLN
INTRAVENOUS | Status: DC
  Filled 2016-10-04: qty 1

## 2016-10-04 MED ORDER — PROTAMINE SULFATE 10 MG/ML IV SOLN
INTRAVENOUS | Status: DC | PRN
  Administered 2016-10-04: 30 mg via INTRAVENOUS
  Administered 2016-10-04: 70 mg via INTRAVENOUS
  Administered 2016-10-04 (×2): 50 mg via INTRAVENOUS
  Administered 2016-10-04: 20 mg via INTRAVENOUS
  Administered 2016-10-04: 50 mg via INTRAVENOUS

## 2016-10-04 MED ORDER — ARTIFICIAL TEARS OPHTHALMIC OINT
TOPICAL_OINTMENT | OPHTHALMIC | Status: AC
  Filled 2016-10-04: qty 3.5

## 2016-10-04 MED ORDER — THROMBIN 20000 UNITS EX SOLR
CUTANEOUS | Status: DC | PRN
  Administered 2016-10-04: 20000 [IU] via TOPICAL

## 2016-10-04 MED ORDER — DOCUSATE SODIUM 100 MG PO CAPS
200.0000 mg | ORAL_CAPSULE | Freq: Every day | ORAL | Status: DC
  Administered 2016-10-05 – 2016-10-07 (×3): 200 mg via ORAL
  Filled 2016-10-04 (×3): qty 2

## 2016-10-04 MED ORDER — ONDANSETRON HCL 4 MG/2ML IJ SOLN
4.0000 mg | Freq: Four times a day (QID) | INTRAMUSCULAR | Status: DC | PRN
  Administered 2016-10-04 – 2016-10-06 (×3): 4 mg via INTRAVENOUS
  Filled 2016-10-04 (×3): qty 2

## 2016-10-04 MED ORDER — MIDAZOLAM HCL 2 MG/2ML IJ SOLN: 2.0000 mg | INTRAMUSCULAR | Status: DC | PRN

## 2016-10-04 MED ORDER — HEPARIN SODIUM (PORCINE) 1000 UNIT/ML IJ SOLN
INTRAMUSCULAR | Status: AC
  Filled 2016-10-04: qty 1

## 2016-10-04 MED ORDER — PHENYLEPHRINE 40 MCG/ML (10ML) SYRINGE FOR IV PUSH (FOR BLOOD PRESSURE SUPPORT)
PREFILLED_SYRINGE | INTRAVENOUS | Status: AC
  Filled 2016-10-04: qty 20

## 2016-10-04 MED ORDER — HEPARIN SODIUM (PORCINE) 1000 UNIT/ML IJ SOLN
INTRAMUSCULAR | Status: DC | PRN
  Administered 2016-10-04: 31000 [IU] via INTRAVENOUS

## 2016-10-04 MED ORDER — SUCCINYLCHOLINE CHLORIDE 20 MG/ML IJ SOLN
INTRAMUSCULAR | Status: DC | PRN
  Administered 2016-10-04: 120 mg via INTRAVENOUS

## 2016-10-04 MED ORDER — METOCLOPRAMIDE HCL 5 MG/ML IJ SOLN
10.0000 mg | Freq: Four times a day (QID) | INTRAMUSCULAR | Status: DC
  Administered 2016-10-04 – 2016-10-07 (×12): 10 mg via INTRAVENOUS
  Filled 2016-10-04 (×10): qty 2

## 2016-10-04 MED ORDER — ACETAMINOPHEN 650 MG RE SUPP
650.0000 mg | Freq: Once | RECTAL | Status: AC
  Administered 2016-10-04: 650 mg via RECTAL

## 2016-10-04 MED ORDER — HEMOSTATIC AGENTS (NO CHARGE) OPTIME
TOPICAL | Status: DC | PRN
  Administered 2016-10-04: 1 via TOPICAL

## 2016-10-04 MED ORDER — LEVOFLOXACIN IN D5W 750 MG/150ML IV SOLN
750.0000 mg | INTRAVENOUS | Status: AC
  Administered 2016-10-05: 750 mg via INTRAVENOUS
  Filled 2016-10-04: qty 150

## 2016-10-04 MED ORDER — CHLORHEXIDINE GLUCONATE 0.12 % MT SOLN
15.0000 mL | OROMUCOSAL | Status: AC
  Administered 2016-10-04: 15 mL via OROMUCOSAL

## 2016-10-04 MED ORDER — SODIUM CHLORIDE 0.9 % IJ SOLN
INTRAMUSCULAR | Status: AC
  Filled 2016-10-04: qty 10

## 2016-10-04 MED ORDER — CHLORHEXIDINE GLUCONATE 0.12% ORAL RINSE (MEDLINE KIT)
15.0000 mL | Freq: Two times a day (BID) | OROMUCOSAL | Status: DC
  Administered 2016-10-04 – 2016-10-05 (×3): 15 mL via OROMUCOSAL

## 2016-10-04 MED ORDER — INSULIN REGULAR BOLUS VIA INFUSION
0.0000 [IU] | Freq: Three times a day (TID) | INTRAVENOUS | Status: DC
  Filled 2016-10-04: qty 10

## 2016-10-04 MED ORDER — LACTATED RINGERS IV SOLN
INTRAVENOUS | Status: DC | PRN
  Administered 2016-10-04 (×2): via INTRAVENOUS

## 2016-10-04 MED ORDER — ORAL CARE MOUTH RINSE
15.0000 mL | Freq: Four times a day (QID) | OROMUCOSAL | Status: DC
  Administered 2016-10-04 – 2016-10-06 (×7): 15 mL via OROMUCOSAL

## 2016-10-04 MED ORDER — MORPHINE SULFATE (PF) 4 MG/ML IV SOLN
2.0000 mg | INTRAVENOUS | Status: DC | PRN
  Administered 2016-10-04 – 2016-10-06 (×11): 4 mg via INTRAVENOUS
  Filled 2016-10-04 (×11): qty 1

## 2016-10-04 MED ORDER — PROTAMINE SULFATE 10 MG/ML IV SOLN
INTRAVENOUS | Status: AC
  Filled 2016-10-04: qty 25

## 2016-10-04 MED ORDER — LACTATED RINGERS IV SOLN: INTRAVENOUS | Status: DC

## 2016-10-04 MED ORDER — FENTANYL CITRATE (PF) 250 MCG/5ML IJ SOLN
INTRAMUSCULAR | Status: AC
  Filled 2016-10-04: qty 25

## 2016-10-04 MED ORDER — OXYCODONE HCL 5 MG PO TABS
5.0000 mg | ORAL_TABLET | ORAL | Status: DC | PRN
  Administered 2016-10-04: 5 mg via ORAL
  Administered 2016-10-05 – 2016-10-07 (×7): 10 mg via ORAL
  Filled 2016-10-04 (×4): qty 2
  Filled 2016-10-04: qty 1
  Filled 2016-10-04 (×3): qty 2

## 2016-10-04 MED ORDER — ASPIRIN EC 325 MG PO TBEC
325.0000 mg | DELAYED_RELEASE_TABLET | Freq: Every day | ORAL | Status: DC
  Administered 2016-10-05 – 2016-10-07 (×3): 325 mg via ORAL
  Filled 2016-10-04 (×3): qty 1

## 2016-10-04 MED ORDER — ASPIRIN 81 MG PO CHEW: 324.0000 mg | CHEWABLE_TABLET | Freq: Every day | ORAL | Status: DC

## 2016-10-04 MED ORDER — ACETAMINOPHEN 500 MG PO TABS
1000.0000 mg | ORAL_TABLET | Freq: Four times a day (QID) | ORAL | Status: DC
  Administered 2016-10-05 – 2016-10-07 (×9): 1000 mg via ORAL
  Filled 2016-10-04 (×9): qty 2

## 2016-10-04 MED ORDER — MIDAZOLAM HCL 10 MG/2ML IJ SOLN
INTRAMUSCULAR | Status: AC
  Filled 2016-10-04: qty 2

## 2016-10-04 MED ORDER — THROMBIN 20000 UNITS EX SOLR
CUTANEOUS | Status: AC
  Filled 2016-10-04: qty 20000

## 2016-10-04 MED ORDER — FAMOTIDINE IN NACL 20-0.9 MG/50ML-% IV SOLN
20.0000 mg | Freq: Two times a day (BID) | INTRAVENOUS | Status: AC
  Administered 2016-10-04: 20 mg via INTRAVENOUS

## 2016-10-04 MED ORDER — ACETAMINOPHEN 160 MG/5ML PO SOLN: 650.0000 mg | Freq: Once | ORAL | Status: AC

## 2016-10-04 MED FILL — Mannitol IV Soln 20%: INTRAVENOUS | Qty: 500 | Status: AC

## 2016-10-04 MED FILL — Heparin Sodium (Porcine) Inj 1000 Unit/ML: INTRAMUSCULAR | Qty: 30 | Status: AC

## 2016-10-04 MED FILL — Sodium Chloride IV Soln 0.9%: INTRAVENOUS | Qty: 3000 | Status: AC

## 2016-10-04 MED FILL — Magnesium Sulfate Inj 50%: INTRAMUSCULAR | Qty: 10 | Status: AC

## 2016-10-04 MED FILL — Lidocaine HCl IV Inj 20 MG/ML: INTRAVENOUS | Qty: 5 | Status: AC

## 2016-10-04 MED FILL — Electrolyte-R (PH 7.4) Solution: INTRAVENOUS | Qty: 4000 | Status: AC

## 2016-10-04 MED FILL — Sodium Bicarbonate IV Soln 8.4%: INTRAVENOUS | Qty: 50 | Status: AC

## 2016-10-04 MED FILL — Potassium Chloride Inj 2 mEq/ML: INTRAVENOUS | Qty: 10 | Status: AC

## 2016-10-04 SURGICAL SUPPLY — 70 items
ADAPTER CARDIO PERF ANTE/RETRO (ADAPTER) ×4 IMPLANT
ADPR PRFSN 84XANTGRD RTRGD (ADAPTER) ×2
ATRICLIP EXCLUSION 35 STD HAND (Clip) ×2 IMPLANT
BAG DECANTER FOR FLEXI CONT (MISCELLANEOUS) ×2 IMPLANT
BLADE STERNUM SYSTEM 6 (BLADE) ×4 IMPLANT
BLADE SURG 15 STRL LF DISP TIS (BLADE) ×2 IMPLANT
BLADE SURG 15 STRL SS (BLADE) ×4
CANISTER SUCT 3000ML PPV (MISCELLANEOUS) ×4 IMPLANT
CANNULA GUNDRY RCSP 15FR (MISCELLANEOUS) ×4 IMPLANT
CATH ROBINSON RED A/P 18FR (CATHETERS) ×12 IMPLANT
CATH THORACIC 28FR (CATHETERS) ×2 IMPLANT
CATH THORACIC 36FR (CATHETERS) ×4 IMPLANT
CATH THORACIC 36FR RT ANG (CATHETERS) ×4 IMPLANT
CONT SPEC 4OZ CLIKSEAL STRL BL (MISCELLANEOUS) ×4 IMPLANT
COVER SURGICAL LIGHT HANDLE (MISCELLANEOUS) ×4 IMPLANT
CRADLE DONUT ADULT HEAD (MISCELLANEOUS) ×4 IMPLANT
DRAPE SLUSH/WARMER DISC (DRAPES) ×4 IMPLANT
DRSG COVADERM 4X14 (GAUZE/BANDAGES/DRESSINGS) ×4 IMPLANT
ELECT CAUTERY BLADE 6.4 (BLADE) ×4 IMPLANT
ELECT REM PT RETURN 9FT ADLT (ELECTROSURGICAL) ×8
ELECTRODE REM PT RTRN 9FT ADLT (ELECTROSURGICAL) ×4 IMPLANT
FELT TEFLON 1X6 (MISCELLANEOUS) ×8 IMPLANT
GAUZE SPONGE 4X4 12PLY STRL (GAUZE/BANDAGES/DRESSINGS) ×4 IMPLANT
GAUZE SPONGE 4X4 12PLY STRL LF (GAUZE/BANDAGES/DRESSINGS) ×2 IMPLANT
GLOVE BIO SURGEON STRL SZ 6 (GLOVE) IMPLANT
GLOVE BIO SURGEON STRL SZ 6.5 (GLOVE) ×2 IMPLANT
GLOVE BIO SURGEON STRL SZ7 (GLOVE) ×4 IMPLANT
GLOVE BIO SURGEON STRL SZ7.5 (GLOVE) IMPLANT
GLOVE BIO SURGEONS STRL SZ 6.5 (GLOVE) ×2
GLOVE EUDERMIC 7 POWDERFREE (GLOVE) ×8 IMPLANT
GOWN STRL REUS W/ TWL LRG LVL3 (GOWN DISPOSABLE) ×8 IMPLANT
GOWN STRL REUS W/ TWL XL LVL3 (GOWN DISPOSABLE) ×2 IMPLANT
GOWN STRL REUS W/TWL LRG LVL3 (GOWN DISPOSABLE) ×28
GOWN STRL REUS W/TWL XL LVL3 (GOWN DISPOSABLE) ×4
HEART VENT LT CURVED (MISCELLANEOUS) ×4 IMPLANT
HEMOSTAT POWDER SURGIFOAM 1G (HEMOSTASIS) ×12 IMPLANT
HEMOSTAT SURGICEL 2X14 (HEMOSTASIS) ×4 IMPLANT
KIT BASIN OR (CUSTOM PROCEDURE TRAY) ×4 IMPLANT
KIT CATH CPB BARTLE (MISCELLANEOUS) ×4 IMPLANT
KIT ROOM TURNOVER OR (KITS) ×4 IMPLANT
KIT SUCTION CATH 14FR (SUCTIONS) ×4 IMPLANT
LINE VENT (MISCELLANEOUS) ×2 IMPLANT
NS IRRIG 1000ML POUR BTL (IV SOLUTION) ×24 IMPLANT
PACK OPEN HEART (CUSTOM PROCEDURE TRAY) ×4 IMPLANT
PAD ARMBOARD 7.5X6 YLW CONV (MISCELLANEOUS) ×8 IMPLANT
SET CARDIOPLEGIA MPS 5001102 (MISCELLANEOUS) ×2 IMPLANT
SUT BONE WAX W31G (SUTURE) ×4 IMPLANT
SUT ETHIBON 2 0 V 52N 30 (SUTURE) ×8 IMPLANT
SUT ETHIBOND 2 0 V5 (SUTURE) ×2 IMPLANT
SUT PROLENE 3 0 SH DA (SUTURE) IMPLANT
SUT PROLENE 3 0 SH1 36 (SUTURE) ×4 IMPLANT
SUT PROLENE 4 0 RB 1 (SUTURE) ×16
SUT PROLENE 4-0 RB1 .5 CRCL 36 (SUTURE) ×6 IMPLANT
SUT SILK 2 0 SH CR/8 (SUTURE) ×2 IMPLANT
SUT STEEL 6MS V (SUTURE) IMPLANT
SUT STEEL SZ 6 DBL 3X14 BALL (SUTURE) ×6 IMPLANT
SUT VIC AB 1 CTX 36 (SUTURE) ×8
SUT VIC AB 1 CTX36XBRD ANBCTR (SUTURE) ×4 IMPLANT
SUTURE E-PAK OPEN HEART (SUTURE) ×4 IMPLANT
SYSTEM SAHARA CHEST DRAIN ATS (WOUND CARE) ×4 IMPLANT
TAPE CLOTH SURG 4X10 WHT LF (GAUZE/BANDAGES/DRESSINGS) ×2 IMPLANT
TAPE PAPER 2X10 WHT MICROPORE (GAUZE/BANDAGES/DRESSINGS) ×2 IMPLANT
TOWEL GREEN STERILE (TOWEL DISPOSABLE) ×12 IMPLANT
TOWEL GREEN STERILE FF (TOWEL DISPOSABLE) ×6 IMPLANT
TOWEL OR 17X24 6PK STRL BLUE (TOWEL DISPOSABLE) ×4 IMPLANT
TOWEL OR 17X26 10 PK STRL BLUE (TOWEL DISPOSABLE) ×4 IMPLANT
TRAY FOLEY SILVER 16FR TEMP (SET/KITS/TRAYS/PACK) ×4 IMPLANT
UNDERPAD 30X30 (UNDERPADS AND DIAPERS) ×4 IMPLANT
VALVE MAGNA EASE AORTIC 23MM (Prosthesis & Implant Heart) ×2 IMPLANT
WATER STERILE IRR 1000ML POUR (IV SOLUTION) ×8 IMPLANT

## 2016-10-04 NOTE — Op Note (Signed)
CARDIOVASCULAR SURGERY OPERATIVE NOTE  10/04/2016 Vernon Ruiz [id number]  Surgeon:  [name], MD  First Assistant: Jari Favre,  PA-C   Preoperative Diagnosis:  Severe aortic stenosis, hx of paroxysmal atrial fibrillation   Postoperative Diagnosis:  Same   Procedure:  1. Median Sternotomy 2. Extracorporeal circulation 3.   Aortic valve replacement using a 23mm Edwards Magna-Ease pericardial valve. 4.  Clipping of left atrial appendage   Anesthesia:  General Endotracheal   Clinical History/Surgical Indication:  The patient is a 66 year old gentleman with type 2 IDDM, hypertension, OSA, PAF on Eliquis and known aortic stenosis. He had presented with some shortness of breath and chest pain in 2015 and was noted to be in atrial fib with RVR. Echo at that time showed moderate AS with a mean gradient of 28 mm Hg. He says that since then he has had no palpitations and if he is having atrial fib he never knows it. He was seen by Dr. Johney Frame in 01/2016 for a second opinion about his atrial fib. His ECG' s had mostly shown sinus rhythm and no further treatment recommended. His echo in 01/2016 showed severe AS with a mean gradient of 48 mm Hg and a peak of 91 mm Hg. His LVEF was 60-65%. Surgery was recommended and he underwent cath on 03/10/2016 showing diffuse non-obstructive CAD with moderate AS with a mean AV gradient of 28.7 mm Hg, a peak of 29 mm Hg and an AVA of 1.2 cm2. His symptoms were only chest pressure with heavy exertion and he decided to hold off on surgery for a while. He had a follow up echo on 09/13/2016 which showed a mean gradient of 38 mm Hg with a peak of 66 mm Hg. The DI was 0.25 and indexed valve area 0.34 cm2/m2. He reports that his symptoms have not changed. He has no shortness of breath, dizziness, syncope, or fatigue. He only gets some substernal chest pressure with heavy exertion, relieved with rest.   He has stage D, severe, symptomatic aortic stenosis with his  only symptom being substernal chest pressure with heavy exertion, NYHA class 1. I have personally reviewed his serial echos and cath images. His echos show progressive AS with a mean gradient of 48 mm Hg in 01/2016. His most recent echo this week shows a mean gradient of 38 mm Hg but his aortic valve is trileaflet, severely calcified and poorly mobile consistent with severe AS. I think AVR is indicated in this patient who will soon become more symptomatic. I don't think he is a TAVR candidate given his low risk for open surgical AVR. He does have a history of PAF but all of the ECG's that I have seen in EPIC show sinus rhythm and he denies any palpitations. I don't think I would perform a MAZE in this patient given this history and his obesity which makes exposure for performing a MAZE more difficult and probably less complete. I would plan to ligate the LAA. I discussed the choices for valve replacement prosthesis including mechanical and bioprosthetic valves. Given his age of 66 I think a bioprosthetic valve would be best so that he does not have to go on Coumadin for the rest of his life and could be maintained on Eliquis for his atrial fib history as long as he tolerates it. His risk for structural valve deterioration is low.   I discussed the operative procedure with the patient and his wifeincluding alternatives, benefits and risks; including but  not limited to bleeding, blood transfusion, infection, stroke, myocardial infarction,heart block requiring a permanent pacemaker, organ dysfunction, and death. Awilda Metro Simaanunderstands and agrees to proceed.    Preparation:  The patient was seen in the preoperative holding area and the correct patient, correct operation were confirmed with the patient after reviewing the medical record and catheterization. The consent was signed by me. Preoperative antibiotics were given. A pulmonary arterial line and radial arterial line were placed by the anesthesia  team. The patient was taken back to the operating room and positioned supine on the operating room table. After being placed under general endotracheal anesthesia by the anesthesia team a foley catheter was placed. The neck, chest, abdomen, and both legs were prepped with betadine soap and solution and draped in the usual sterile manner. A surgical time-out was taken and the correct patient and operative procedure were confirmed with the nursing and anesthesia staff.   Pre-bypass TEE:   Complete TEE assessment was performed by Dr. Marguerita Merles.   Conclusions   Result status: Final result   Left ventricle: Normal cavity size, wall thickness and left ventricular diastolic function. LV systolic function is normal with an EF of 55-60%. There are no obvious wall motion abnormalities. No thrombus present. No mass present.  Aortic valve: The valve is trileaflet. Severe valve thickening present. Severe valve calcification present. Severely decreased leaflet separation. Severe stenosis. Trace regurgitation. No AV vegetation.  Mitral valve: Mild leaflet thickening is present. Mild leaflet calcification is present. Mild mitral annular calcification. Mild regurgitation.  Right ventricle: Normal cavity size, wall thickness and ejection fraction.  Pulmonic valve: Trace regurgitation.         Post-bypass TEE:  Post-Op TEE AORTA Aorta unchanged from pre-bypass.  LEFT VENTRICLE Left ventricle unchanged from pre-bypass.  RIGHT VENTRICLE Right ventricle unchanged from pre-bypass.  AORTIC VALVE A bioprosthetic valve was placed. Valve is well seated. Leaflet is thin and freely mobile. paravalvular leak and The transaortic gradient is normal post replacement. Post-op transaortic gradient is .  MITRAL VALVE Mitral valve unchanged from pre-bypass.  TRICUSPID VALVE Tricuspid valve unchanged from pre-bypass.    Cardiopulmonary Bypass:  A median sternotomy was performed. The pericardium  was opened in the midline. Right ventricular function appeared normal. The ascending aorta was of normal size and had no palpable plaque. There were no contraindications to aortic cannulation or cross-clamping. The patient was fully systemically heparinized and the ACT was maintained > 400 sec. The proximal aortic arch was cannulated with a 20 F aortic cannula for arterial inflow. Venous cannulation was performed via the right atrial appendage using a two-staged venous cannula. An antegrade cardioplegia/vent cannula was inserted into the mid-ascending aorta. A left ventricular vent was placed via the right superior pulmonary vein. A retrograde cardioplegia cannnula was placed into the coronary sinus via the right atrium. Aortic occlusion was performed with a single cross-clamp. Systemic cooling to 32 degrees Centigrade and topical cooling of the heart with iced saline were used. Hyperkalemic antegrade cold blood cardioplegia was used to induce diastolic arrest and then cold blood retrograde cardioplegia was given at about 20 minute intervals throughout the period of arrest to maintain myocardial temperature at or below 10 degrees centigrade. A temperature probe was inserted into the interventricular septum and an insulating pad was placed in the pericardium. Carbon dioxide was insufflated into the pericardium at 5L/min throughout the procedure to minimize intracardiac air.   Aortic Valve Replacement:   A transverse aortotomy was performed 1 cm above the  take-off of the right coronary artery. The native valve was tricuspid with calcified leaflets and moderate annular calcification. The ostia of the coronary arteries were in normal position and were not obstructed. The native valve leaflets were excised and the annulus was decalcified with rongeurs. Care was taken to remove all particulate debris. The left ventricle was directly inspected for debris and then irrigated with ice saline solution. The annulus was  sized and a size 23 mm Edwards Magna-Ease Pericardial valve was chosen. The model number was [id number] and the serial number was [id number]. While the valve was being prepared 2-0 Ethibond pledgeted horizontal mattress sutures were placed around the annulus with the pledgets in a sub-annular position. The sutures were placed through the sewing ring and the valve lowered into place. The sutures were tied sequentially. The valve seated nicely and the coronary ostia were not obstructed. The prosthetic valve leaflets moved normally and there was no sub-valvular obstruction. The aortotomy was closed using 4-0 Prolene suture in 2 layers with felt strips to reinforce the closure.   Ligation of left atrial appendage:   The base of the appendage was measured and a 35 mm Atricure Atriclip mitral clip was chosen. This was placed across the base of the LAA without difficulty.    Completion:  The patient was rewarmed to 37 degrees Centigrade. De-airing maneuvers were performed and the head placed in trendelenburg position. The crossclamp was removed with a time of 98 minutes. There was spontaneous return of sinus rhythm. The aortotomy was checked for hemostasis. Two temporary epicardial pacing wires were placed on the right atrium and two on the right ventricle. The left ventricular vent and retrograde cardioplegia cannulas were removed. The patient was weaned from CPB without difficulty on no inotropes. CPB time was 121 minutes. Cardiac output was 5 LPM. Heparin was fully reversed with protamine and the aortic and venous cannulas removed. Hemostasis was achieved. Mediastinal drainage tubes were placed. The sternum was closed with double #6 stainless steel wires. The fascia was closed with continuous # 1 vicryl suture. The subcutaneous tissue was closed with 2-0 vicryl continuous suture. The skin was closed with 3-0 vicryl subcuticular suture. All sponge, needle, and instrument counts were reported correct at the end of the  case. Dry sterile dressings were placed over the incisions and around the chest tubes which were connected to pleurevac suction. The patient was then transported to the surgical intensive care unit in critical but stable condition.

## 2016-10-04 NOTE — Anesthesia Procedure Notes (Signed)
Procedure Name: Intubation Date/Time: 10/04/2016 7:31 AM Performed by: Neldon Newport Pre-anesthesia Checklist: Timeout performed, Patient being monitored, Suction available, Emergency Drugs available and Patient identified Patient Re-evaluated:Patient Re-evaluated prior to inductionOxygen Delivery Method: Circle system utilized Preoxygenation: Pre-oxygenation with 100% oxygen Intubation Type: IV induction Ventilation: Mask ventilation without difficulty Laryngoscope Size: Mac and 3 Grade View: Grade II Tube type: Oral Tube size: 8.0 mm Number of attempts: 1 Placement Confirmation: breath sounds checked- equal and bilateral,  positive ETCO2 and ETT inserted through vocal cords under direct vision Secured at: 23 cm Tube secured with: Tape Dental Injury: Teeth and Oropharynx as per pre-operative assessment

## 2016-10-04 NOTE — Progress Notes (Signed)
Patient ID: Vernon Ruiz, male   DOB: [birth year], 66 y.o.   MRN: [id number] EVENING ROUNDS NOTE :     [address]       [place] [address]             [phone]                 Day of Surgery Procedure(s) (LRB): AORTIC VALVE REPLACEMENT (AVR) (N/A) CLIPPING OF ATRIAL APPENDAGE (N/A) TRANSESOPHAGEAL ECHOCARDIOGRAM (TEE) (N/A)  Total Length of Stay:  LOS: 0 days  BP 123/83   Pulse 89   Temp 99.9 F (37.7 C)   Resp 18   SpO2 96%   .Intake/Output      06/25 0701 - 06/26 0700   I.V. 3329.8   Blood 1355   IV Piggyback 975   Total Intake 5659.8   Urine 1560   Blood 1600   Chest Tube 140   Total Output 3300   Net +2359.8         . sodium chloride 10 mL/hr at 10/04/16 1230  . [START ON 10/05/2016] sodium chloride    . sodium chloride 20 mL/hr at 10/04/16 1230  . albumin human    . dexmedetomidine (PRECEDEX) IV infusion Stopped (10/04/16 1830)  . famotidine (PEPCID) IV Stopped (10/04/16 1300)  . insulin (NOVOLIN-R) infusion 3.3 Units/hr (10/04/16 1830)  . lactated ringers    . lactated ringers 10 mL/hr at 10/04/16 1230  . lactated ringers 10 mL/hr at 10/04/16 1738  . [START ON 10/05/2016] levofloxacin (LEVAQUIN) IV    . nitroGLYCERIN Stopped (10/04/16 1230)  . phenylephrine (NEO-SYNEPHRINE) Adult infusion 10 mcg/min (10/04/16 1730)  . vancomycin 1,000 mg (10/04/16 1900)     Lab Results  Component Value Date   WBC 15.6 (H) 10/04/2016   HGB 13.0 10/04/2016   HCT 38.6 (L) 10/04/2016   PLT 173 10/04/2016   GLUCOSE 134 (H) 10/04/2016   ALT 23 09/30/2016   AST 25 09/30/2016   NA 139 10/04/2016   K 4.1 10/04/2016   CL 104 10/04/2016   CREATININE 0.83 10/04/2016   BUN 16 10/04/2016   CO2 21 (L) 09/30/2016   INR 1.32 10/04/2016   HGBA1C 9.5 (H) 09/30/2016   Awake and extubated  Neuro intact Not bleeding  Delight Ovens MD  Beeper [phone] Office [phone] 10/04/2016 7:38 PM

## 2016-10-04 NOTE — Procedures (Signed)
Extubation Procedure Note  Patient Details:   Name: Vernon Ruiz DOB: Apr 28, 1950 MRN: [id number]   Airway Documentation:     Evaluation  O2 sats: stable throughout and currently acceptable Complications: No apparent complications Patient did tolerate procedure well. Bilateral Breath Sounds: Clear   Yes   NIF -38, VC 1013  Antoine Poche 10/04/2016, 5:01 PM

## 2016-10-04 NOTE — Progress Notes (Signed)
eLink Physician-Brief Progress Note Patient Name: Vernon Ruiz DOB: February 22, 1951 MRN: [id number]   Date of Service  10/04/2016  HPI/Events of Note  Hx of severe sleep apnea.  Noted to have oxygen desaturation while asleep.   eICU Interventions  Will order auto CPAP.     Intervention Category Major Interventions: Other:  Massie Mees 10/04/2016, 8:07 PM

## 2016-10-04 NOTE — Progress Notes (Signed)
RT in room to start CPAP on Vernon Ruiz. Pt. Refuses to wear CPAP despite my attempt to educate him on the need for it. I will continue to monitor.

## 2016-10-04 NOTE — Anesthesia Postprocedure Evaluation (Signed)
Anesthesia Post Note  Patient: Vernon Ruiz  Procedure(s) Performed: Procedure(s) (LRB): AORTIC VALVE REPLACEMENT (AVR) (N/A) CLIPPING OF ATRIAL APPENDAGE (N/A) TRANSESOPHAGEAL ECHOCARDIOGRAM (TEE) (N/A)     Patient location during evaluation: SICU Anesthesia Type: General Level of consciousness: sedated Pain management: pain level controlled Vital Signs Assessment: post-procedure vital signs reviewed and stable Respiratory status: patient remains intubated per anesthesia plan Cardiovascular status: stable Anesthetic complications: no    Last Vitals:  Vitals:   10/04/16 1300 10/04/16 1400  BP:    Pulse: 80 80  Resp: 12 12  Temp: 36.4 C 36.5 C    Last Pain:  Vitals:   10/04/16 1400  TempSrc: Core (Comment)                 Zaniyah Wernette,W. EDMOND

## 2016-10-04 NOTE — Anesthesia Procedure Notes (Signed)
Central Venous Catheter Insertion Performed by: Roderic Palau, anesthesiologist Start/End6/25/2018 6:42 AM, 10/04/2016 6:52 AM Patient location: Pre-op. Preanesthetic checklist: patient identified, IV checked, site marked, risks and benefits discussed, surgical consent, monitors and equipment checked, pre-op evaluation, timeout performed and anesthesia consent Position: Trendelenburg Lidocaine 1% used for infiltration and patient sedated Hand hygiene performed , maximum sterile barriers used  and Seldinger technique used Catheter size: 9 Fr Total catheter length 10. Central line and PA cath was placed.MAC introducer Swan type:thermodilution PA Cath depth:50 Procedure performed using ultrasound guided technique. Ultrasound Notes:anatomy identified, needle tip was noted to be adjacent to the nerve/plexus identified, no ultrasound evidence of intravascular and/or intraneural injection and image(s) printed for medical record Attempts: 1 Following insertion, line sutured and dressing applied. Post procedure assessment: blood return through all ports, free fluid flow and no air  Patient tolerated the procedure well with no immediate complications.

## 2016-10-04 NOTE — Interval H&P Note (Signed)
History and Physical Interval Note:  10/04/2016 5:37 AM  Vernon Ruiz  has presented today for surgery, with the diagnosis of severe aortic stenosis  The various methods of treatment have been discussed with the patient and family. After consideration of risks, benefits and other options for treatment, the patient has consented to  Procedure(s): AORTIC VALVE REPLACEMENT (AVR) (N/A) CLIPPING OF ATRIAL APPENDAGE (N/A) TRANSESOPHAGEAL ECHOCARDIOGRAM (TEE) (N/A) as a surgical intervention .  The patient's history has been reviewed, patient examined, no change in status, stable for surgery.  I have reviewed the patient's chart and labs.  Questions were answered to the patient's satisfaction.     [name]

## 2016-10-04 NOTE — H&P (Signed)
[address]       Vernon Ruiz [address]             [phone]      Cardiothoracic Surgery Admission History and Physical   PCP is Vernon Guiles, MD Referring Provider is Vernon Bollman, MD      Chief Complaint  Patient presents with  . Aortic Stenosis    Surgical eval, ECHO 09/13/16, Cardic Cath 03/10/16    HPI:  The patient is a 66 year old gentleman with type 2 IDDM, hypertension, OSA, PAF on Eliquis and known aortic stenosis. He had presented with some shortness of breath and chest pain in 2015 and was noted to be in atrial fib with RVR. Echo at that time showed moderate AS with a mean gradient of 28 mm Hg. He says that since then he has had no palpitations and if he is having atrial fib he never knows it. He was seen by Dr. Johney Ruiz in 01/2016 for a second opinion about his atrial fib. His ECG' s had mostly shown sinus rhythm and no further treatment recommended.  His echo in 01/2016 showed severe AS with a mean gradient of 48 mm Hg and a peak of 91 mm Hg. His LVEF was 60-65%. Surgery was recommended and he underwent cath on 03/10/2016 showing diffuse non-obstructive CAD with moderate AS with a mean AV gradient of 28.7 mm Hg, a peak of 29 mm Hg and an AVA of 1.2 cm2. His symptoms were only chest pressure with heavy exertion and he decided to hold off on surgery for a while. He had a follow up echo on 09/13/2016 which showed a mean gradient of 38 mm Hg with a peak of 66 mm Hg. The DI was 0.25 and indexed valve area 0.34 cm2/m2. He reports that his symptoms have not changed. He has no shortness of breath, dizziness, syncope, or fatigue. He only gets some substernal chest pressure with heavy exertion, relieved with rest.        Past Medical History:  Diagnosis Date  . Aortic stenosis   . Diabetes mellitus without complication (HCC)   . Hypertension   . Obstructive sleep apnea    noncompliant with CPAP  . Persistent atrial fibrillation [facility])           Past Surgical History:  Procedure Laterality Date  . APPENDECTOMY    . CARDIAC CATHETERIZATION N/A 03/10/2016   Procedure: Right/Left Heart Cath and Coronary Angiography;  Surgeon: Vernon Bollman, MD;  Location: [facility] INVASIVE CV LAB;  Service: Cardiovascular;  Laterality: N/A;  . HERNIA REPAIR      FH: he denies any family history of aortic valve disease. Mother had coronary disease.  Social History     Social History  Substance Use Topics  . Smoking status: Smokes 1-2 cigars per day  . Smokeless tobacco: Never Used  . Alcohol use No          Current Outpatient Prescriptions  Medication Sig Dispense Refill  . apixaban (ELIQUIS) 5 MG TABS tablet Take 5 mg by mouth 2 (two) times daily.    Marland Kitchen CARTIA XT 120 MG 24 hr capsule TAKE 1 CAPSULE BY MOUTH EVERY DAY 30 capsule 0  . Cyanocobalamin (VITAMIN B 12 PO) Take 1 tablet by mouth 2 (two) times a week.     . Insulin Glargine (TOUJEO SOLOSTAR [place]) Inject 50 Units into the skin daily.     Marland Kitchen losartan (COZAAR) 100 MG tablet Take 50  mg by mouth daily.     . metFORMIN (GLUCOPHAGE) 1000 MG tablet Take 1 tablet by mouth 2 (two) times daily.  1  . TRULICITY 1.5 MG/0.5ML SOPN Inject 1.5 mg into the skin once a week.  2  . ezetimibe (ZETIA) 10 MG tablet Take 10 mg by mouth daily.    Marland Kitchen glimepiride (AMARYL) 4 MG tablet Take 4 mg by mouth daily with breakfast.    . JANUMET 50-1000 MG tablet Take 1 tablet by mouth 2 (two) times daily.  0   No current facility-administered medications for this visit.          Allergies  Allergen Reactions  . Penicillins Itching and Rash    All over body  Has patient had a PCN reaction causing immediate rash, facial/tongue/throat swelling, SOB or lightheadedness with hypotension: no Has patient had a PCN reaction causing severe rash involving mucus membranes or skin necrosis: no Has patient had a PCN reaction that required hospitalization no Has patient had a PCN reaction  occurring within the last 10 years: no If all of the above answers are "NO", then may proceed with Cephalosporin use.  . Sulfa Antibiotics     Rash  . Sulfamethoxazole Rash    Review of Systems  Constitutional: Negative for activity change, fatigue, fever and unexpected weight change.  HENT: Negative.        See his dentis every 6 months.  Eyes: Negative.   Respiratory: Negative for shortness of breath.   Cardiovascular: Negative for palpitations and leg swelling.       Exertional substernal chest pressure  Gastrointestinal: Negative.   Endocrine: Negative.   Genitourinary: Positive for frequency.  Musculoskeletal: Negative.   Allergic/Immunologic: Negative.   Neurological: Negative for dizziness and syncope.  Hematological: Negative.   Psychiatric/Behavioral: Negative.     BP 113/70   Pulse 68   Resp 20   Ht 5\' 5"  (1.651 m)   Wt 219 lb (99.3 kg)   SpO2 96% Comment: RA  BMI 36.44 kg/m  Physical Exam  Constitutional: He is oriented to person, place, and time.  Obese gentleman in no distress  HENT:  Head: Normocephalic and atraumatic.  Mouth/Throat: Oropharynx is clear and moist.  Teeth in fair condition  Eyes: EOM are normal. Pupils are equal, round, and reactive to light.  Neck: Normal range of motion. Neck supple. No JVD present. No thyromegaly present.  Cardiovascular: Normal rate, regular rhythm and intact distal pulses.   Murmur heard. 3/6 harsh systolic murmur RSB  Pulmonary/Chest: Effort normal and breath sounds normal. No respiratory distress. He has no wheezes. He has no rales.  Abdominal: Soft. Bowel sounds are normal. There is no tenderness.  Musculoskeletal: Normal range of motion. He exhibits no edema.  Lymphadenopathy:    He has no cervical adenopathy.  Neurological: He is alert and oriented to person, place, and time. He has normal strength. No cranial nerve deficit or sensory deficit.  Skin: Skin is warm and dry.  Psychiatric: He has a normal  mood and affect.     Diagnostic Tests:  Vernon Ruiz  Cardiac catheterization  Order# [id number]  Reading physician: Vernon Bollman, MD Ordering physician: Vernon Bollman, MD Study date: 03/10/16  Physicians   Panel Physicians Referring Physician Case Authorizing Physician  Vernon Bollman, MD (Primary)    Procedures   Right/Left Heart Cath and Coronary Angiography  Conclusion   1. Diffuse nonobstructive CAD 2. Moderately severe aortic stenosis 3. Normal right heart hemodynamics/normal LVEDP  Pt  with echo criteria for severe aortic stenosis (mean gradient 42 mmHg, peak velocity 4 m/s) and borderline cath criteria. Reasonable to consider aortic valve replacement as he does have angina at a high workload with no other explanation for symptoms. He wishes to wait several months for surgical referral because of insurance reasons. He is counseled about keeping an eye out for progressive symptoms.   Indications   Severe aortic stenosis [I35.0 (ICD-10-CM)]  Procedural Details/Technique   Technical Details INDICATION: Severe aortic stenosis  PROCEDURAL DETAILS: There was an indwelling IV in a right antecubital vein. Using normal sterile technique, the IV was changed out for a 5 Fr brachial sheath over a 0.018 inch wire. The right wrist was then prepped, draped, and anesthetized with 1% lidocaine. Using the modified Seldinger technique a 5/6 French Slender sheath was placed in the right radial artery. Intra-arterial verapamil was administered through the radial artery sheath. IV heparin was administered after a JR4 catheter was advanced into the central aorta. A Swan-Ganz catheter was used for the right heart catheterization. Standard protocol was followed for recording of right heart pressures and sampling of oxygen saturations. Fick cardiac output was calculated. Standard Judkins catheters were used for selective coronary angiography. Left ventricular pressure was measured with a  JR4 catheter after directing a J-tip wire across the aortic valve. A pullback was recorded. There were no immediate procedural complications. The patient was transferred to the post catheterization recovery area for further monitoring.    Estimated blood loss <50 mL.  During this procedure the patient was administered the following to achieve and maintain moderate conscious sedation: Versed 2 mg, Fentanyl 25 mcg, while the patient's heart rate, blood pressure, and oxygen saturation were continuously monitored. The period of conscious sedation was 32 minutes, of which I was present face-to-face 100% of this time.    Coronary Findings   Dominance: Right  Left Anterior Descending  Dist LAD lesion, 30% stenosed.  First Diagonal Branch  1st Diag lesion, 30% stenosed.  Second Diagonal [employer] 2nd Diag to 2nd Diag lesion, 40% stenosed.  Ramus Intermedius  Ost Ramus to Ramus lesion, 50% stenosed. The lesion is eccentric. 40-50% stenosis, does not appear flow-restrictive  Left Circumflex  Vessel is small. Very small AV circumflex  Right Coronary Artery  Mid RCA lesion, 30% stenosed. The RCA is a dominant vessel wtih a high anterior origin, mild nonobstructive disease noted.  Right Heart   Right Heart Pressures LV EDP is normal.    Left Heart   Aortic Valve There is moderate aortic valve stenosis. The aortic valve is calcified. There is restricted aortic valve motion. Mean aortic valve gradient 29 mmHg, calculated AVA 1.2 square cm    Coronary Diagrams   Diagnostic Diagram       Implants        No implant documentation for this case.  PACS Images   Show images for Cardiac catheterization   Link to Procedure Log   Procedure Log    Hemo Data    Most Recent Value  Fick Cardiac Output 5.71 L/min  Fick Cardiac Output Index 2.8 (L/min)/BSA  Aortic Mean Gradient 28.7 mmHg  Aortic Peak Gradient 29 mmHg  Aortic Valve Area 1.20  Aortic Value Area Index 0.59 cm2/BSA    RA A Wave 10 mmHg  RA V Wave 8 mmHg  RA Mean 8 mmHg  RV Systolic Pressure 29 mmHg  RV Diastolic Pressure 8 mmHg  RV EDP 11 mmHg  PA Systolic Pressure 26  mmHg  PA Diastolic Pressure 13 mmHg  PA Mean 19 mmHg  PW A Wave 6 mmHg  PW V Wave 5 mmHg  PW Mean 3 mmHg  AO Systolic Pressure 108 mmHg  AO Diastolic Pressure 71 mmHg  AO Mean 86 mmHg  LV Systolic Pressure 168 mmHg  LV Diastolic Pressure 6 mmHg  LV EDP 19 mmHg  Arterial Occlusion Pressure Extended Systolic Pressure 123 mmHg  Arterial Occlusion Pressure Extended Diastolic Pressure 70 mmHg  Arterial Occlusion Pressure Extended Mean Pressure 91 mmHg  Left Ventricular Apex Extended Systolic Pressure 152 mmHg  Left Ventricular Apex Extended Diastolic Pressure 7 mmHg  Left Ventricular Apex Extended EDP Pressure 13 mmHg  QP/QS 1  TPVR Index 6.79 HRUI  TSVR Index 30.75 HRUI  PVR SVR Ratio 0.21  TPVR/TSVR Ratio 0.22   *[place] [place] [facility]* [address] [place], [place] [address] [phone]  ------------------------------------------------------------------- Transthoracic Echocardiography  Patient: Vernon, Ruiz MR #: [id number] Study Date: 09/13/2016 Gender: M Age: 82 Height: 165.1 cm Weight: 97.1 kg BSA: 2.15 m^2 Pt. Status: Room:  ATTENDING Vernon Bollman, MD ORDERING Vernon Bollman, MD REFERRING Vernon Bollman, MD SONOGRAPHER Clearence Ped, RCS PERFORMING [facility], Outpatient  cc:  ------------------------------------------------------------------- LV EF: 55% - 60%  ------------------------------------------------------------------- Indications: Aortic Stenosis (I35.0).  ------------------------------------------------------------------- History: PMH: hypertension, OSA. Atrial fibrillation. Risk factors: Diabetes  mellitus.  ------------------------------------------------------------------- Study Conclusions  - Left ventricle: The cavity size was mildly dilated. Wall thickness was increased in a pattern of mild LVH. Systolic function was normal. The estimated ejection fraction was in the range of 55% to 60%. - Aortic valve: Pevious gardients in October 2017 noted higher gradients mean 48 mmHg and peak of 91 mmHg. Calculared AVA still severe consider right and left cath and dedicated pedof doppler exam. There was severe stenosis. There was mild regurgitation. Valve area (VTI): 0.8 cm^2. Valve area (Vmax): 0.77 cm^2. Valve area (Vmean): 0.73 cm^2. - Left atrium: The atrium was moderately dilated. - Pulmonary arteries: PA peak pressure: 38 mm Hg (S).  ------------------------------------------------------------------- Study data: Comparison was made to the study of 01/20/2016. Study status: Routine. Procedure: The patient reported no pain pre or post test. Transthoracic echocardiography. Image quality was adequate. Transthoracic echocardiography. M-mode, complete 2D, spectral Doppler, and color Doppler. Birthdate: Patient birthdate: Dec 07, 1950. Age: Patient is 66 yr old. Sex: Gender: male. BMI: 35.6 kg/m^2. Blood pressure: 116/68 Patient status: Outpatient. Study date: Study date: 09/13/2016. Study time: 09:34 AM. Location: [facility]  -------------------------------------------------------------------  ------------------------------------------------------------------- Left ventricle: The cavity size was mildly dilated. Wall thickness was increased in a pattern of mild LVH. Systolic function was normal. The estimated ejection fraction was in the range of 55% to 60%.  ------------------------------------------------------------------- Aortic valve: Pevious gardients in October 2017 noted higher gradients mean 48 mmHg and peak  of 91 mmHg. Calculared AVA still severe consider right and left cath and dedicated pedof doppler exam. Trileaflet; severely thickened, severely calcified leaflets. Doppler: There was severe stenosis. There was mild regurgitation. VTI ratio of LVOT to aortic valve: 0.25. Valve area (VTI): 0.8 cm^2. Indexed valve area (VTI): 0.37 cm^2/m^2. Peak velocity ratio of LVOT to aortic valve: 0.25. Valve area (Vmax): 0.77 cm^2. Indexed valve area (Vmax): 0.36 cm^2/m^2. Mean velocity ratio of LVOT to aortic valve: 0.23. Valve area (Vmean): 0.73 cm^2. Indexed valve area (Vmean): 0.34 cm^2/m^2. Mean gradient (S): 38 mm Hg. Peak gradient (S): 66 mm Hg.  ------------------------------------------------------------------- Mitral valve: Mildly thickened leaflets . Doppler: There was trivial regurgitation. Peak gradient (D): 3 mm  Hg.  ------------------------------------------------------------------- Left atrium: The atrium was moderately dilated.  ------------------------------------------------------------------- Systemic veins: Inferior vena cava: The vessel was dilated. The respirophasic diameter changes were in the normal range (= 50%), consistent with normal central venous pressure. Diameter: 23 mm.  ------------------------------------------------------------------- Measurements  IVC Value Reference ID 23 mm ---------  Left ventricle Value Reference LV ID, ED, PLAX chordal 51.7 mm 43 - 52 LV ID, ES, PLAX chordal 33.7 mm 23 - 38 LV fx shortening, PLAX chordal 35 % >=29 LV PW thickness, ED 14.2 mm --------- IVS/LV PW ratio, ED 0.94 <=1.3 Stroke volume, 2D 77 ml  --------- Stroke volume/bsa, 2D 36 ml/m^2 ---------  Ventricular septum Value Reference IVS thickness, ED 13.4 mm ---------  LVOT Value Reference LVOT ID, S 20 mm --------- LVOT area 3.14 cm^2 --------- LVOT peak velocity, S 100 cm/s --------- LVOT mean velocity, S 65.7 cm/s --------- LVOT VTI, S 24.5 cm ---------  Aortic valve Value Reference Aortic valve peak velocity, S 406 cm/s --------- Aortic valve mean velocity, S 283 cm/s --------- Aortic valve VTI, S 96.2 cm --------- Aortic mean gradient, S 38 mm Hg --------- Aortic peak gradient, S 66 mm Hg --------- VTI ratio, LVOT/AV 0.25 --------- Aortic valve area, VTI 0.8 cm^2 --------- Aortic valve area/bsa, VTI 0.37 cm^2/m^2 --------- Velocity ratio, peak, LVOT/AV 0.25 --------- Aortic valve area, peak velocity 0.77 cm^2 --------- Aortic valve area/bsa, peak 0.36 cm^2/m^2 --------- velocity Velocity ratio, mean, LVOT/AV 0.23 --------- Aortic valve area, mean velocity 0.73 cm^2 --------- Aortic valve area/bsa, mean 0.34 cm^2/m^2 --------- velocity Aortic regurg pressure half-time 487 ms ---------  Aorta Value Reference Aortic root ID, ED 35 mm ---------  Left atrium  Value Reference LA ID, A-P, ES 44 mm --------- LA ID/bsa, A-P 2.05 cm/m^2 <=2.2 LA volume, S 72.5 ml --------- LA volume/bsa, S 33.7 ml/m^2 --------- LA volume, ES, 1-p A4C 78 ml --------- LA volume/bsa, ES, 1-p A4C 36.3 ml/m^2 --------- LA volume, ES, 1-p A2C 64.1 ml --------- LA volume/bsa, ES, 1-p A2C 29.8 ml/m^2 ---------  Mitral valve Value Reference Mitral E-wave peak velocity 89.7 cm/s --------- Mitral A-wave peak velocity 69.4 cm/s --------- Mitral deceleration time (L) 144 ms 150 - 230 Mitral peak gradient, D 3 mm Hg --------- Mitral E/A ratio, peak 1.3 ---------  Pulmonary arteries Value Reference PA pressure, S, DP (H) 38 mm Hg <=30  Tricuspid valve Value Reference Tricuspid regurg peak velocity 296 cm/s --------- Tricuspid peak RV-RA gradient 35 mm Hg --------- Tricuspid maximal regurg 296 cm/s --------- velocity, PISA  Systemic veins Value Reference Estimated CVP 3 mm Hg ---------  Right ventricle Value Reference RV pressure, S, DP (H) 38 mm Hg <=30  Legend: (L) and (H) mark values outside specified reference range.  ------------------------------------------------------------------- Prepared and Electronically Authenticated by  Charlton Haws,  M.D. 2018-06-04T11:25:38   Impression:  This 66 year old gentleman has stage D, severe, symptomatic aortic stenosis with his only symptom being substernal chest pressure with heavy exertion, NYHA class 1. I have personally reviewed his serial echos and cath images. His echos show progressive AS with a mean gradient of 48 mm Hg in 01/2016. His most recent echo this week shows a mean gradient of 38 mm Hg but his aortic valve is trileaflet, severely calcified and poorly mobile consistent with severe AS. I think AVR is indicated in this patient who will soon become more symptomatic. I don't think he is a TAVR candidate given his low risk for open surgical AVR. He does have a history of PAF but  all of the ECG's that I have seen in EPIC show sinus rhythm and he denies any palpitations. I don't think I would perform a MAZE in this patient given this history and his obesity which makes exposure for performing a MAZE more difficult and probably less complete. I would plan to ligate the LAA. I discussed the choices for valve replacement prosthesis including mechanical and bioprosthetic valves. Given his age of 81 I think a bioprosthetic valve would be best so that he does not have to go on Coumadin for the rest of his life and could be maintained on Eliquis for his atrial fib history as long as he tolerates it. His risk for structural valve deterioration is low.   I discussed the operative procedure with the patient and his wife including alternatives, benefits and risks; including but not limited to bleeding, blood transfusion, infection, stroke, myocardial infarction, heart block requiring a permanent pacemaker, organ dysfunction, and death.  Vernon Ruiz understands and agrees to proceed.    Plan:  AVR using a bioprosthetic valve and clipping of the left atrial appendage on 10/04/2016.    [name], MD [facility] [phone]

## 2016-10-04 NOTE — Transfer of Care (Signed)
Immediate Anesthesia Transfer of Care Note  Patient: BARIN SHIRAH  Procedure(s) Performed: Procedure(s): AORTIC VALVE REPLACEMENT (AVR) (N/A) CLIPPING OF ATRIAL APPENDAGE (N/A) TRANSESOPHAGEAL ECHOCARDIOGRAM (TEE) (N/A)  Patient Location: SICU  Anesthesia Type:General  Level of Consciousness: Patient remains intubated per anesthesia plan  Airway & Oxygen Therapy: Patient remains intubated per anesthesia plan and Patient placed on Ventilator (see vital sign flow sheet for setting)  Post-op Assessment: Report given to RN  Post vital signs: Reviewed and stable  Last Vitals:  Vitals:   10/04/16 0618 10/04/16 1229  BP: (!) 167/84 (!) 91/59  Pulse:  80  Resp:  12  Temp:      Last Pain:  Vitals:   10/04/16 0559  TempSrc: Oral      Patients Stated Pain Goal: 3 (10/04/16 0554)  Complications: No apparent anesthesia complications

## 2016-10-04 NOTE — Brief Op Note (Signed)
10/04/2016  11:06 AM  PATIENT:  Vernon Ruiz  66 y.o. male  PRE-OPERATIVE DIAGNOSIS:  severe aortic stenosis  POST-OPERATIVE DIAGNOSIS:  severe aortic stenosis  PROCEDURE:  Procedure(s): AORTIC VALVE REPLACEMENT (AVR) (N/A) CLIPPING OF ATRIAL APPENDAGE (N/A) TRANSESOPHAGEAL ECHOCARDIOGRAM (TEE) (N/A)  SURGEON:  Surgeon(s) and Role:    * Bartle, Payton Doughty, MD - Primary  PHYSICIAN ASSISTANT:  Jari Favre, PA-C   ANESTHESIA:   general  EBL:  Total I/O In: 2000 [I.V.:2000] Out: 650 [Urine:650]  BLOOD ADMINISTERED:none  DRAINS: ROUTINE   LOCAL MEDICATIONS USED:  NONE  SPECIMEN:  Source of Specimen:  aortic valve leaflets  DISPOSITION OF SPECIMEN:  PATHOLOGY  COUNTS:  YES  TOURNIQUET:  * No tourniquets in log *  DICTATION: .Dragon Dictation  PLAN OF CARE: Admit to inpatient   PATIENT DISPOSITION:  ICU - intubated and hemodynamically stable.   Delay start of Pharmacological VTE agent (>24hrs) due to surgical blood loss or risk of bleeding: yes

## 2016-10-05 ENCOUNTER — Inpatient Hospital Stay (HOSPITAL_COMMUNITY): Payer: Medicare Other

## 2016-10-05 ENCOUNTER — Encounter (HOSPITAL_COMMUNITY): Payer: Self-pay | Admitting: Surgery

## 2016-10-05 LAB — GLUCOSE, CAPILLARY
GLUCOSE-CAPILLARY: 87 mg/dL (ref 65–99)
GLUCOSE-CAPILLARY: 100 mg/dL — AB (ref 65–99)
GLUCOSE-CAPILLARY: 84 mg/dL (ref 65–99)
GLUCOSE-CAPILLARY: 80 mg/dL (ref 65–99)
GLUCOSE-CAPILLARY: 82 mg/dL (ref 65–99)
GLUCOSE-CAPILLARY: 83 mg/dL (ref 65–99)
GLUCOSE-CAPILLARY: 126 mg/dL — AB (ref 65–99)
Glucose-Capillary: 222 mg/dL — ABNORMAL HIGH (ref 65–99)
Glucose-Capillary: 70 mg/dL (ref 65–99)
Glucose-Capillary: 89 mg/dL (ref 65–99)
Glucose-Capillary: 90 mg/dL (ref 65–99)

## 2016-10-05 LAB — CBC
HEMATOCRIT: 37.4 % — AB (ref 39.0–52.0)
HEMATOCRIT: 39.3 % (ref 39.0–52.0)
HEMOGLOBIN: 12.4 g/dL — AB (ref 13.0–17.0)
HEMOGLOBIN: 12.4 g/dL — AB (ref 13.0–17.0)
MCH: 26.7 pg (ref 26.0–34.0)
MCH: 26.1 pg (ref 26.0–34.0)
MCHC: 33.2 g/dL (ref 30.0–36.0)
MCHC: 31.6 g/dL (ref 30.0–36.0)
MCV: 80.4 fL (ref 78.0–100.0)
MCV: 82.7 fL (ref 78.0–100.0)
Platelets: 178 10*3/uL (ref 150–400)
Platelets: 168 10*3/uL (ref 150–400)
RBC: 4.65 MIL/uL (ref 4.22–5.81)
RBC: 4.75 MIL/uL (ref 4.22–5.81)
RDW: 14.3 % (ref 11.5–15.5)
RDW: 15 % (ref 11.5–15.5)
WBC: 15.6 10*3/uL — ABNORMAL HIGH (ref 4.0–10.5)
WBC: 19 10*3/uL — ABNORMAL HIGH (ref 4.0–10.5)

## 2016-10-05 LAB — BASIC METABOLIC PANEL
Anion gap: 5 (ref 5–15)
BUN: 16 mg/dL (ref 6–20)
CALCIUM: 7.8 mg/dL — AB (ref 8.9–10.3)
CHLORIDE: 107 mmol/L (ref 101–111)
CO2: 23 mmol/L (ref 22–32)
CREATININE: 0.85 mg/dL (ref 0.61–1.24)
GFR calc Af Amer: >60 mL/min (ref >60)
GFR calc non Af Amer: >60 mL/min (ref >60)
Glucose, Bld: 80 mg/dL (ref 65–99)
Potassium: 4.2 mmol/L (ref 3.5–5.1)
Sodium: 135 mmol/L (ref 135–145)

## 2016-10-05 LAB — CREATININE, SERUM
Creatinine, Ser: 0.99 mg/dL (ref 0.61–1.24)
GFR calc Af Amer: >60 mL/min (ref >60)
GFR calc non Af Amer: >60 mL/min (ref >60)

## 2016-10-05 LAB — POCT I-STAT, CHEM 8
BUN: 21 mg/dL — AB (ref 6–20)
CALCIUM ION: 1.18 mmol/L (ref 1.15–1.40)
CREATININE: 0.8 mg/dL (ref 0.61–1.24)
Chloride: 101 mmol/L (ref 101–111)
GLUCOSE: 223 mg/dL — AB (ref 65–99)
HEMATOCRIT: 40 % (ref 39.0–52.0)
Hemoglobin: 13.6 g/dL (ref 13.0–17.0)
Potassium: 4.2 mmol/L (ref 3.5–5.1)
Sodium: 135 mmol/L (ref 135–145)
TCO2: 22 mmol/L (ref 0–100)

## 2016-10-05 LAB — MAGNESIUM
MAGNESIUM: 2.1 mg/dL (ref 1.7–2.4)
Magnesium: 2.3 mg/dL (ref 1.7–2.4)

## 2016-10-05 IMAGING — DX DG CHEST 1V PORT
1 series · 1 of 1 positions shown · non-contrast
Comparison: [DATE]

CLINICAL DATA: Status post AVR

EXAM:
PORTABLE CHEST 1 VIEW

[chest ap]
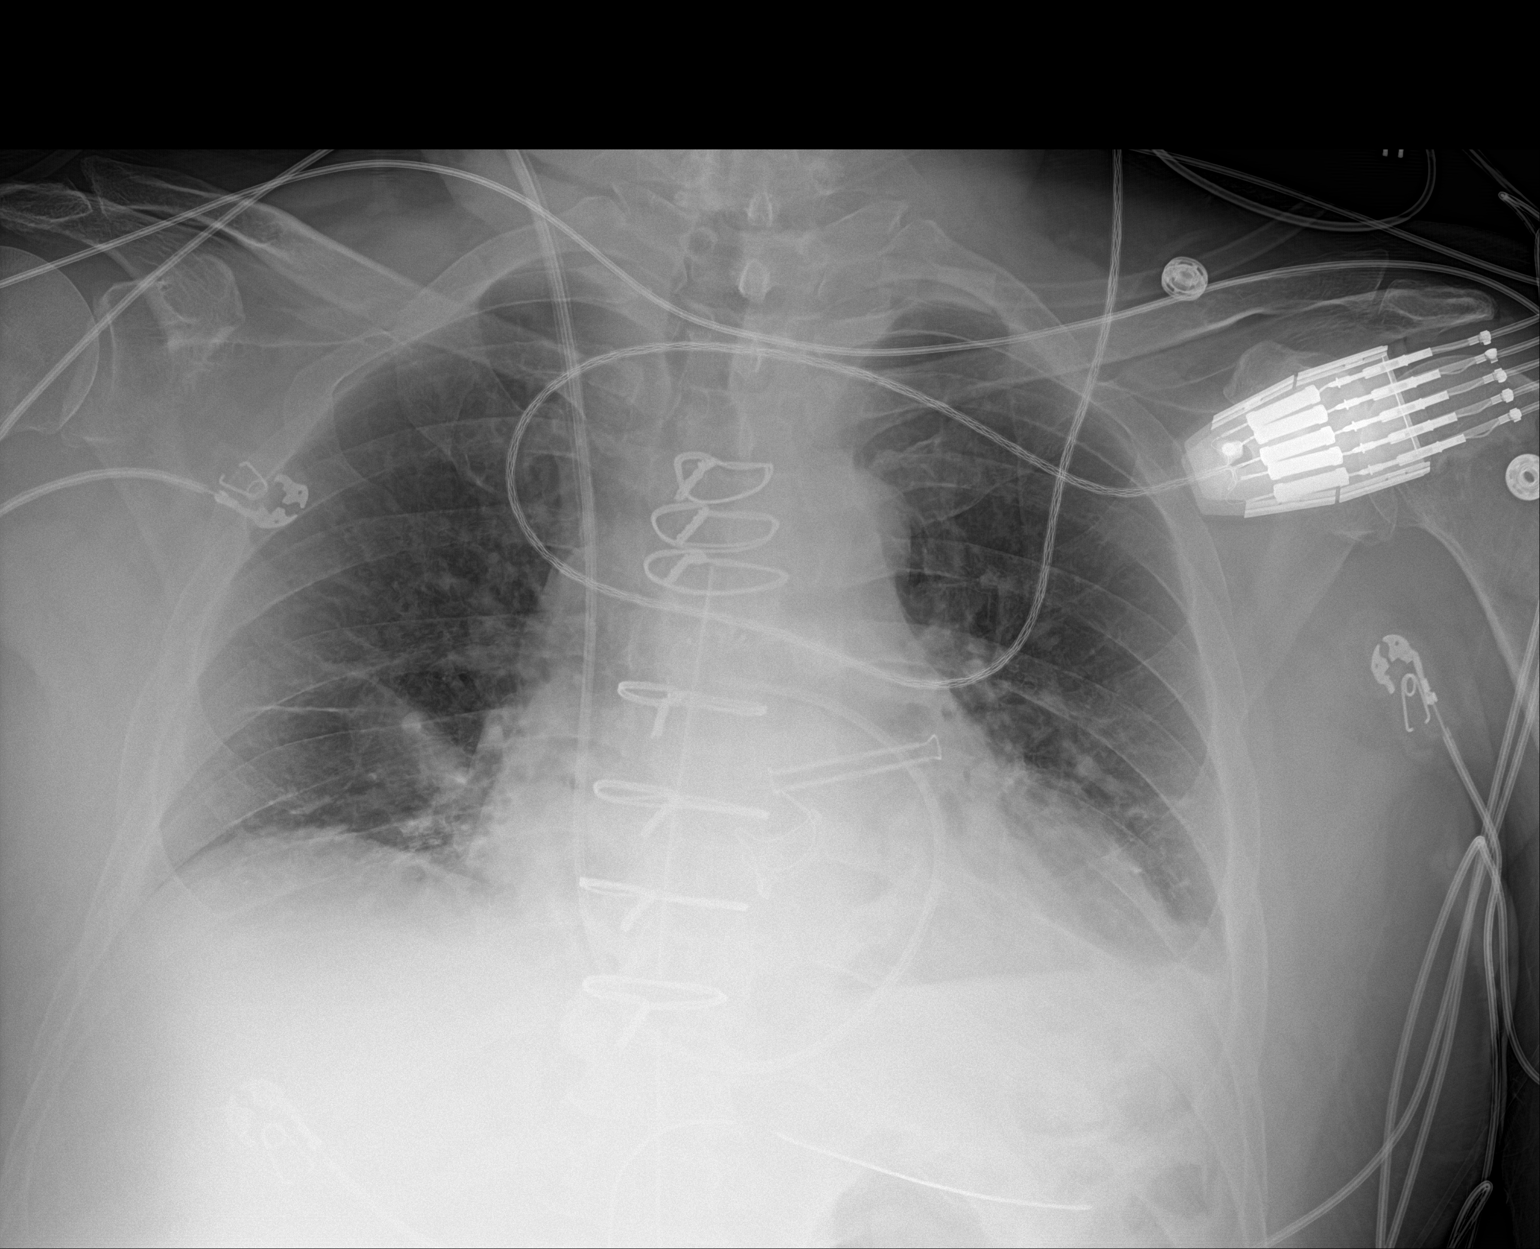

[1 of 1 positions shown; findings below may reference images not displayed]

FINDINGS: Postsurgical changes are again seen. Mediastinal drain is noted in
satisfactory position. The endotracheal tube and nasogastric
catheter have been removed in the interval. Pericardial drain is
noted as well. Swan-Ganz catheter is noted in the right pulmonary
artery. The lungs are hypoaerated with mild bibasilar atelectasis.
No pneumothorax is seen. No acute bony abnormality is noted.
IMPRESSION: Postoperative change with tubes and lines as described. Mild
bibasilar atelectatic changes are seen.

## 2016-10-05 MED ORDER — FUROSEMIDE 10 MG/ML IJ SOLN
40.0000 mg | Freq: Once | INTRAMUSCULAR | Status: AC
  Administered 2016-10-05: 40 mg via INTRAVENOUS
  Filled 2016-10-05: qty 4

## 2016-10-05 MED ORDER — ENOXAPARIN SODIUM 40 MG/0.4ML ~~LOC~~ SOLN
40.0000 mg | Freq: Every day | SUBCUTANEOUS | Status: DC
  Administered 2016-10-05 – 2016-10-08 (×4): 40 mg via SUBCUTANEOUS
  Filled 2016-10-05 (×4): qty 0.4

## 2016-10-05 MED ORDER — INSULIN ASPART 100 UNIT/ML ~~LOC~~ SOLN
0.0000 [IU] | SUBCUTANEOUS | Status: DC
Start: 2016-10-05 — End: 2016-10-07
  Administered 2016-10-05: 2 [IU] via SUBCUTANEOUS
  Administered 2016-10-05: 8 [IU] via SUBCUTANEOUS
  Administered 2016-10-05: 4 [IU] via SUBCUTANEOUS
  Administered 2016-10-06: 8 [IU] via SUBCUTANEOUS
  Administered 2016-10-06 (×2): 2 [IU] via SUBCUTANEOUS
  Administered 2016-10-06: 4 [IU] via SUBCUTANEOUS
  Administered 2016-10-06 – 2016-10-07 (×2): 2 [IU] via SUBCUTANEOUS
  Administered 2016-10-07 (×2): 4 [IU] via SUBCUTANEOUS

## 2016-10-05 MED ORDER — INSULIN DETEMIR 100 UNIT/ML ~~LOC~~ SOLN
15.0000 [IU] | Freq: Every day | SUBCUTANEOUS | Status: DC
  Administered 2016-10-06: 15 [IU] via SUBCUTANEOUS
  Filled 2016-10-05 (×2): qty 0.15

## 2016-10-05 MED ORDER — INSULIN ASPART 100 UNIT/ML ~~LOC~~ SOLN: 0.0000 [IU] | SUBCUTANEOUS | Status: DC

## 2016-10-05 MED ORDER — INSULIN DETEMIR 100 UNIT/ML ~~LOC~~ SOLN
15.0000 [IU] | Freq: Every day | SUBCUTANEOUS | Status: AC
  Administered 2016-10-05: 15 [IU] via SUBCUTANEOUS
  Filled 2016-10-05: qty 0.15

## 2016-10-05 NOTE — Care Management Note (Signed)
Case Management Note Donn Pierini RN, BSN Unit 2W-Case Manager-- 2H coverage [phone]   Patient Details  Name: Vernon Ruiz MRN: [id number] Date of Birth: 28-Aug-1950  Subjective/Objective:  Pt admitted s/p AVR on 10/04/16                 Action/Plan: PTA pt lived at home with spouse- anticipate return home- CM to follow.   Expected Discharge Date:                  Expected Discharge Plan:  Home/Self Care  In-House Referral:     Discharge planning Services  CM Consult  Post Acute Care Choice:    Choice offered to:     DME Arranged:    DME Agency:     HH Arranged:    HH Agency:     Status of Service:  In process, will continue to follow  If discussed at Long Length of Stay Meetings, dates discussed:    Discharge Disposition:   Additional Comments:  Darrold Span, RN 10/05/2016, 10:54 AM

## 2016-10-05 NOTE — Progress Notes (Signed)
Significant apnea overnight with O2 sat as low as 83% on 4L [place].  Will start auto-titrating PAP device then will likely need to go home with CPAP.  Alyson Reedy, M.D. [facility] Pulmonary/Critical Care Medicine. Pager: [phone]. After hours pager: [phone].

## 2016-10-05 NOTE — Progress Notes (Signed)
Patient ID: Vernon Ruiz, male   DOB: 03/02/1951, 66 y.o.   MRN: [id number]  SICU Evening Rounds:  Hemodynamically stable in sinus rhythm  Urine output ok  BMET    Component Value Date/Time   NA 135 10/05/2016 1535   K 4.2 10/05/2016 1535   CL 101 10/05/2016 1535   CO2 23 10/05/2016 0442   GLUCOSE 223 (H) 10/05/2016 1535   BUN 21 (H) 10/05/2016 1535   CREATININE 0.80 10/05/2016 1535   CALCIUM 7.8 (L) 10/05/2016 0442   GFRNONAA >60 10/05/2016 1531   GFRAA >60 10/05/2016 1531   CBC    Component Value Date/Time   WBC 19.0 (H) 10/05/2016 1531   RBC 4.75 10/05/2016 1531   HGB 13.6 10/05/2016 1535   HCT 40.0 10/05/2016 1535   PLT 168 10/05/2016 1531   MCV 82.7 10/05/2016 1531   MCH 26.1 10/05/2016 1531   MCHC 31.6 10/05/2016 1531   RDW 15.0 10/05/2016 1531

## 2016-10-05 NOTE — Progress Notes (Signed)
1 Day Post-Op Procedure(s) (LRB): AORTIC VALVE REPLACEMENT (AVR) (N/A) CLIPPING OF ATRIAL APPENDAGE (N/A) TRANSESOPHAGEAL ECHOCARDIOGRAM (TEE) (N/A) Subjective: No specific complaints, a little sore  Objective: Vital signs in last 24 hours: Temp:  [97.3 F (36.3 C)-101.3 F (38.5 C)] 101.1 F (38.4 C) (06/26 0600) Pulse Rate:  [80-96] 91 (06/26 0600) Cardiac Rhythm: Normal sinus rhythm (06/26 0400) Resp:  [7-19] 14 (06/26 0600) BP: (88-126)/(54-84) 108/57 (06/26 0600) SpO2:  [92 %-100 %] 93 % (06/26 0600) Arterial Line BP: (93-168)/(52-73) 105/53 (06/26 0600) FiO2 (%):  [40 %-50 %] 40 % (06/25 1612) Weight:  [104.8 kg (231 lb 0.7 oz)] 104.8 kg (231 lb 0.7 oz) (06/26 0400)  Hemodynamic parameters for last 24 hours: PAP: (20-35)/(11-21) 28/17 CO:  [4.3 L/min-6.3 L/min] 4.4 L/min CI:  [2.1 L/min/m2-3 L/min/m2] 2.1 L/min/m2  Intake/Output from previous day: 06/25 0701 - 06/26 0700 In: 6373.7 [P.O.:120; I.V.:3923.7; Blood:1355; IV Piggyback:975] Out: 4628 [MNOTR:7116; Blood:1600; Chest Tube:270] Intake/Output this shift: No intake/output data recorded.  General appearance: drowsy but cooperative Neurologic: intact Heart: regular rate and rhythm, S1, S2 normal, no murmur, click, rub or gallop Lungs: clear to auscultation bilaterally Extremities: edema mild Wound: dressing dry  Lab Results:  Recent Labs  10/04/16 1830 10/05/16 0442  WBC 15.6* 15.6*  HGB 13.0 12.4*  HCT 38.6* 37.4*  PLT 173 178   BMET:  Recent Labs  10/04/16 1823 10/04/16 1830 10/05/16 0442  NA 139  --  135  K 4.1  --  4.2  CL 104  --  107  CO2  --   --  23  GLUCOSE 134*  --  80  BUN 16  --  16  CREATININE 0.60* 0.83 0.85  CALCIUM  --   --  7.8*    PT/INR:  Recent Labs  10/04/16 1239  LABPROT 16.5*  INR 1.32   ABG    Component Value Date/Time   PHART 7.302 (L) 10/04/2016 1818   HCO3 23.8 10/04/2016 1818   TCO2 24 10/04/2016 1823   ACIDBASEDEF 3.0 (H) 10/04/2016 1818   O2SAT  93.0 10/04/2016 1818   CBG (last 3)   Recent Labs  10/05/16 0445 10/05/16 0558 10/05/16 0651  GLUCAP 89 80 82   CXR: mild bibasilar atelectasis  ECG: sinus, non-specific changes  Assessment/Plan: S/P Procedure(s) (LRB): AORTIC VALVE REPLACEMENT (AVR) (N/A) CLIPPING OF ATRIAL APPENDAGE (N/A) TRANSESOPHAGEAL ECHOCARDIOGRAM (TEE) (N/A) He is  Hemodynamically stable in sinus rhythm. Will start low dose lopressor once off neo. OSA by exam and observation overnight with decrease in sats to low 80's when asleep. Refused to use CPAP.  Mobilize Diuresis once stable off neo Diabetes control: poorly controlled at home with Hgb A1c 9.5 on Levemir and metformin. I suspect there is no dietary compliance. Glucose now under good control on low level insulin drip.  Start Levemir and SSI.  d/c tubes/lines Continue foley due to patient in ICU and urinary output monitoring See progression orders   LOS: 1 day    [name] 10/05/2016

## 2016-10-06 ENCOUNTER — Encounter: Payer: BLUE CROSS/BLUE SHIELD | Admitting: Surgery

## 2016-10-06 ENCOUNTER — Inpatient Hospital Stay (HOSPITAL_COMMUNITY): Payer: Medicare Other

## 2016-10-06 LAB — GLUCOSE, CAPILLARY
GLUCOSE-CAPILLARY: 68 mg/dL (ref 65–99)
GLUCOSE-CAPILLARY: 123 mg/dL — AB (ref 65–99)
GLUCOSE-CAPILLARY: 122 mg/dL — AB (ref 65–99)
GLUCOSE-CAPILLARY: 175 mg/dL — AB (ref 65–99)
Glucose-Capillary: 183 mg/dL — ABNORMAL HIGH (ref 65–99)
Glucose-Capillary: 158 mg/dL — ABNORMAL HIGH (ref 65–99)
Glucose-Capillary: 245 mg/dL — ABNORMAL HIGH (ref 65–99)

## 2016-10-06 LAB — CBC
HCT: 36.1 % — ABNORMAL LOW (ref 39.0–52.0)
HEMOGLOBIN: 11.4 g/dL — AB (ref 13.0–17.0)
MCH: 26.3 pg (ref 26.0–34.0)
MCHC: 31.6 g/dL (ref 30.0–36.0)
MCV: 83.2 fL (ref 78.0–100.0)
PLATELETS: 145 10*3/uL — AB (ref 150–400)
RBC: 4.34 MIL/uL (ref 4.22–5.81)
RDW: 15 % (ref 11.5–15.5)
WBC: 13.6 10*3/uL — AB (ref 4.0–10.5)

## 2016-10-06 LAB — BASIC METABOLIC PANEL
ANION GAP: 5 (ref 5–15)
BUN: 16 mg/dL (ref 6–20)
CALCIUM: 8 mg/dL — AB (ref 8.9–10.3)
CO2: 28 mmol/L (ref 22–32)
CREATININE: 0.93 mg/dL (ref 0.61–1.24)
Chloride: 102 mmol/L (ref 101–111)
Glucose, Bld: 110 mg/dL — ABNORMAL HIGH (ref 65–99)
Potassium: 3.9 mmol/L (ref 3.5–5.1)
SODIUM: 135 mmol/L (ref 135–145)

## 2016-10-06 IMAGING — DX DG CHEST 1V PORT
1 series · 1 of 1 positions shown · non-contrast
Comparison: [DATE].

CLINICAL DATA: Sore chest and aortic valve replacement.

EXAM:
PORTABLE CHEST 1 VIEW

[chest ap]
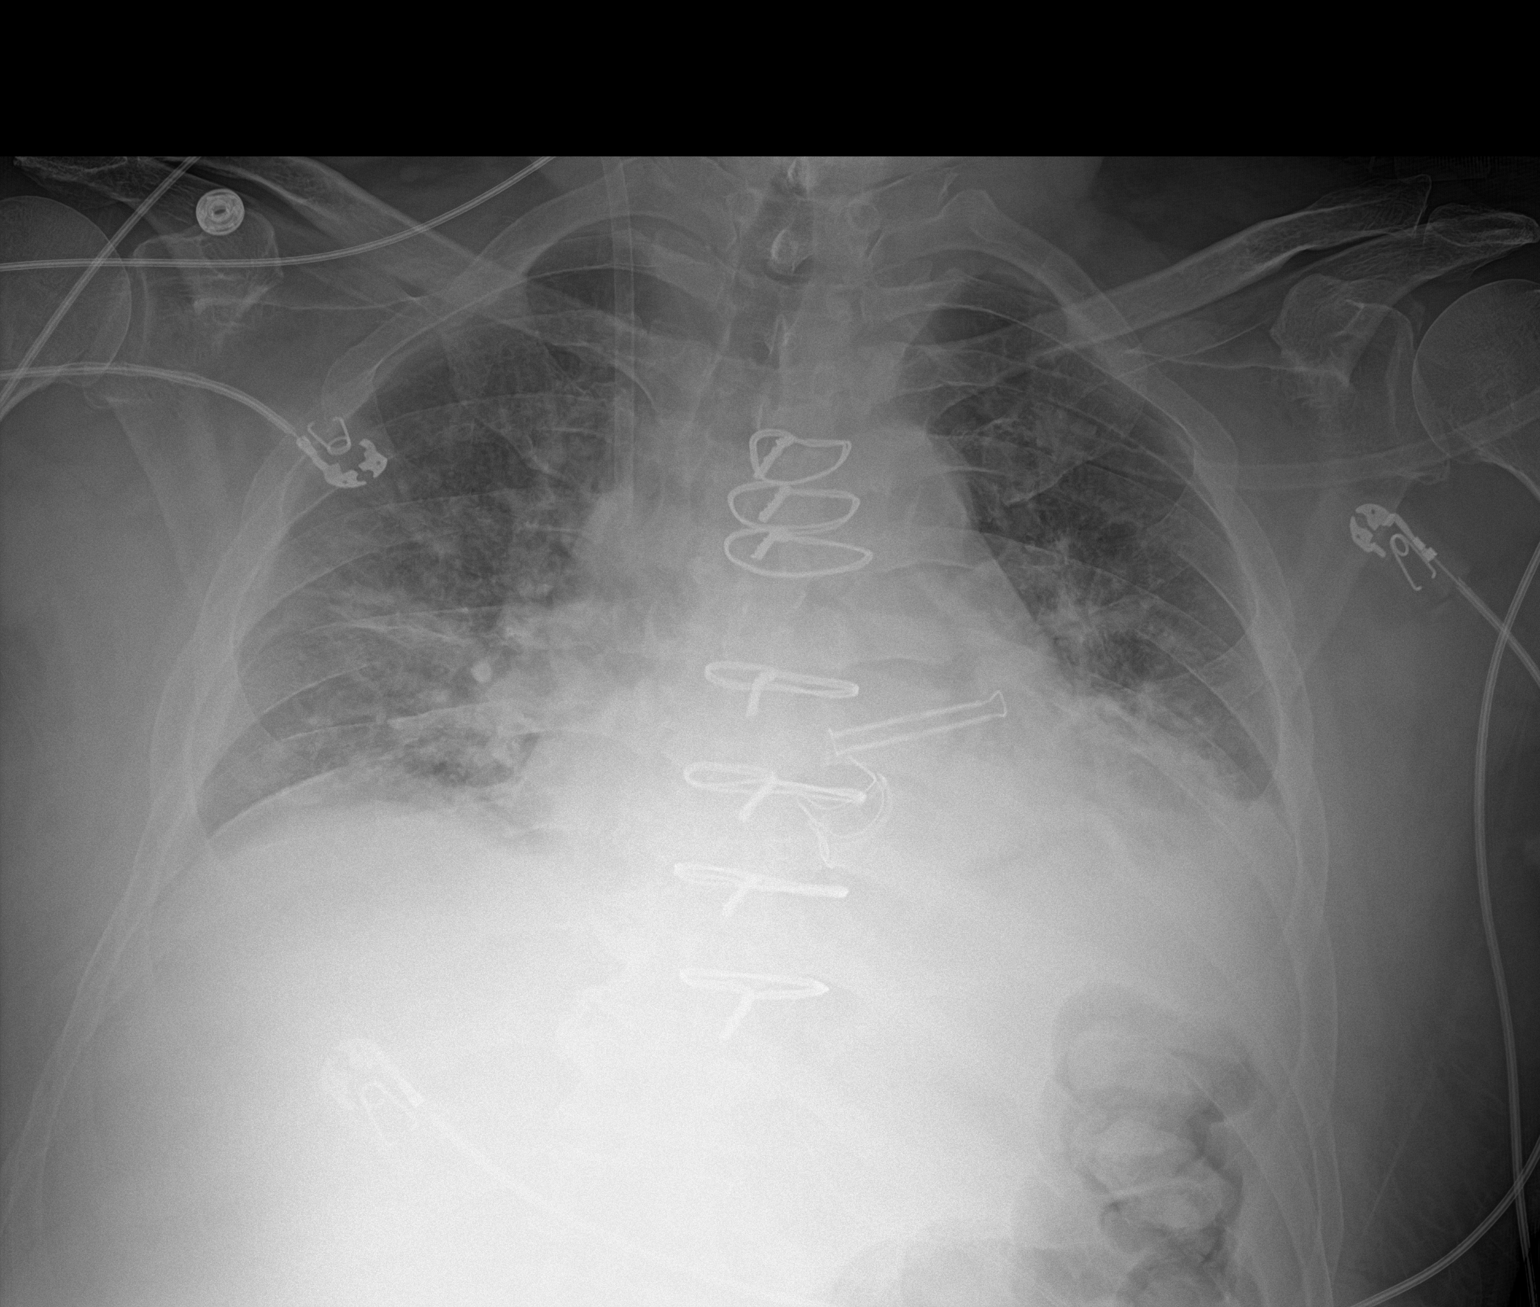

[1 of 1 positions shown; findings below may reference images not displayed]

FINDINGS: Interim removal of Swan-Ganz catheter and mediastinal drainage
catheter. Right IJ sheath in stable position. Prior cardiac valve
replacement. Left atrial appendage clip. Cardiomegaly with pulmonary
vascular prominence and bilateral interstitial prominence with
bilateral pleural effusions consistent CHF. Low lung volumes.
IMPRESSION: 1. Interim removal Swan-Ganz catheter via drainage catheter. Right
IJ sheath in stable position.

2. Prior cardiac valve replacement. Congestive heart failure with
bilateral pulmonary interstitial edema small pleural effusions.
Interstitial edema is new from prior exam.

3.  Low lung volumes with basilar atelectasis .

## 2016-10-06 MED ORDER — METOPROLOL TARTRATE 25 MG/10 ML ORAL SUSPENSION: 25.0000 mg | Freq: Two times a day (BID) | ORAL | Status: DC

## 2016-10-06 MED ORDER — POTASSIUM CHLORIDE CRYS ER 20 MEQ PO TBCR
20.0000 meq | EXTENDED_RELEASE_TABLET | Freq: Two times a day (BID) | ORAL | Status: DC
  Administered 2016-10-06 – 2016-10-07 (×3): 20 meq via ORAL
  Filled 2016-10-06 (×3): qty 1

## 2016-10-06 MED ORDER — METOPROLOL TARTRATE 25 MG PO TABS
25.0000 mg | ORAL_TABLET | Freq: Two times a day (BID) | ORAL | Status: DC
  Administered 2016-10-06 – 2016-10-07 (×3): 25 mg via ORAL
  Filled 2016-10-06 (×3): qty 1

## 2016-10-06 MED ORDER — FUROSEMIDE 10 MG/ML IJ SOLN
40.0000 mg | Freq: Once | INTRAMUSCULAR | Status: AC
  Administered 2016-10-06: 40 mg via INTRAVENOUS
  Filled 2016-10-06: qty 4

## 2016-10-06 MED ORDER — ORAL CARE MOUTH RINSE
15.0000 mL | Freq: Two times a day (BID) | OROMUCOSAL | Status: DC
  Administered 2016-10-06 – 2016-10-07 (×3): 15 mL via OROMUCOSAL

## 2016-10-06 NOTE — Progress Notes (Signed)
Patient ID: Vernon Ruiz, male   DOB: 1950/06/26, 66 y.o.   MRN: [id number] EVENING ROUNDS NOTE :     [address]       [place] [address]             [phone]                 2 Days Post-Op Procedure(s) (LRB): AORTIC VALVE REPLACEMENT (AVR) (N/A) CLIPPING OF ATRIAL APPENDAGE (N/A) TRANSESOPHAGEAL ECHOCARDIOGRAM (TEE) (N/A)  Total Length of Stay:  LOS: 2 days  BP 105/66   Pulse 96   Temp 98 F (36.7 C) (Axillary)   Resp (!) 0   Wt 220 lb 7.4 oz (100 kg)   SpO2 96%   BMI 36.13 kg/m   .Intake/Output      06/26 0701 - 06/27 0700 06/27 0701 - 06/28 0700   P.O. 480 720   I.V. (mL/kg) 325.1 (3.3) 110 (1.1)   Blood     IV Piggyback 150    Total Intake(mL/kg) 955.1 (9.6) 830 (8.3)   Urine (mL/kg/hr) 2100 (0.9) 1100 (1)   Blood     Chest Tube 60 (0)    Total Output 2160 1100   Net -1204.9 -270          . sodium chloride 10 mL/hr at 10/06/16 0400  . sodium chloride    . sodium chloride 20 mL/hr at 10/04/16 1230  . lactated ringers    . lactated ringers 10 mL/hr at 10/05/16 0400  . lactated ringers 10 mL/hr at 10/05/16 0400  . nitroGLYCERIN Stopped (10/04/16 1230)  . phenylephrine (NEO-SYNEPHRINE) Adult infusion Stopped (10/05/16 1500)     Lab Results  Component Value Date   WBC 13.6 (H) 10/06/2016   HGB 11.4 (L) 10/06/2016   HCT 36.1 (L) 10/06/2016   PLT 145 (L) 10/06/2016   GLUCOSE 110 (H) 10/06/2016   ALT 23 09/30/2016   AST 25 09/30/2016   NA 135 10/06/2016   K 3.9 10/06/2016   CL 102 10/06/2016   CREATININE 0.93 10/06/2016   BUN 16 10/06/2016   CO2 28 10/06/2016   INR 1.32 10/04/2016   HGBA1C 9.5 (H) 09/30/2016   Patient awake and alert  Stable day Ambulated   Delight Ovens MD  Beeper [phone] Office [phone] 10/06/2016 6:30 PM

## 2016-10-06 NOTE — Progress Notes (Signed)
Asked patient if he wears CPAP at night. Patient stated that he does not wear one at home and does not want to wear one now. RT explained importance of wearing CPAP and encouraged patient, but patient still refused. Patient is in no distress and on nasal cannula at this time. RT will continue to monitor.

## 2016-10-06 NOTE — Progress Notes (Signed)
2 Days Post-Op Procedure(s) (LRB): AORTIC VALVE REPLACEMENT (AVR) (N/A) CLIPPING OF ATRIAL APPENDAGE (N/A) TRANSESOPHAGEAL ECHOCARDIOGRAM (TEE) (N/A) Subjective: No complaints, pain under control  Objective: Vital signs in last 24 hours: Temp:  [98.1 F (36.7 C)-99.2 F (37.3 C)] 99.2 F (37.3 C) (06/27 0300) Pulse Rate:  [83-121] 111 (06/27 0800) Cardiac Rhythm: Sinus tachycardia (06/27 0800) Resp:  [10-23] 18 (06/27 0800) BP: (97-163)/(59-85) 139/72 (06/27 0800) SpO2:  [89 %-100 %] 92 % (06/27 0800) Arterial Line BP: (120-141)/(49-68) 122/49 (06/26 1400) Weight:  [100 kg (220 lb 7.4 oz)] 100 kg (220 lb 7.4 oz) (06/27 0500)  Hemodynamic parameters for last 24 hours:    Intake/Output from previous day: 06/26 0701 - 06/27 0700 In: 955.1 [P.O.:480; I.V.:325.1; IV Piggyback:150] Out: 2160 [Urine:2100; Chest Tube:60] Intake/Output this shift: Total I/O In: 10 [I.V.:10] Out: -   General appearance: alert and cooperative Neurologic: intact Heart: regular rate and rhythm, S1, S2 normal, no murmur, click, rub or gallop Lungs: diminished breath sounds bibasilar Extremities: edema mild Wound: dressing dry  Lab Results:  Recent Labs  10/05/16 1531 10/05/16 1535 10/06/16 0347  WBC 19.0*  --  13.6*  HGB 12.4* 13.6 11.4*  HCT 39.3 40.0 36.1*  PLT 168  --  145*   BMET:  Recent Labs  10/05/16 0442  10/05/16 1535 10/06/16 0347  NA 135  --  135 135  K 4.2  --  4.2 3.9  CL 107  --  101 102  CO2 23  --   --  28  GLUCOSE 80  --  223* 110*  BUN 16  --  21* 16  CREATININE 0.85  < > 0.80 0.93  CALCIUM 7.8*  --   --  8.0*  < > = values in this interval not displayed.  PT/INR:  Recent Labs  10/04/16 1239  LABPROT 16.5*  INR 1.32   ABG    Component Value Date/Time   PHART 7.302 (L) 10/04/2016 1818   HCO3 23.8 10/04/2016 1818   TCO2 22 10/05/2016 1535   ACIDBASEDEF 3.0 (H) 10/04/2016 1818   O2SAT 93.0 10/04/2016 1818   CBG (last 3)   Recent Labs   10/06/16 0343 10/06/16 0653 10/06/16 0731  GLUCAP 68 123* 122*   CLINICAL DATA:  Sore chest and aortic valve replacement.  EXAM: PORTABLE CHEST 1 VIEW  COMPARISON:  10/05/2016.  FINDINGS: Interim removal of Swan-Ganz catheter and mediastinal drainage catheter. Right IJ sheath in stable position. Prior cardiac valve replacement. Left atrial appendage clip. Cardiomegaly with pulmonary vascular prominence and bilateral interstitial prominence with bilateral pleural effusions consistent CHF. Low lung volumes.  IMPRESSION: 1. Interim removal Swan-Ganz catheter via drainage catheter. Right IJ sheath in stable position.  2. Prior cardiac valve replacement. Congestive heart failure with bilateral pulmonary interstitial edema small pleural effusions. Interstitial edema is new from prior exam.  3.  Low lung volumes with basilar atelectasis .   Electronically Signed   By: Maisie Fus  Register   On: 10/06/2016 07:37  Assessment/Plan: S/P Procedure(s) (LRB): AORTIC VALVE REPLACEMENT (AVR) (N/A) CLIPPING OF ATRIAL APPENDAGE (N/A) TRANSESOPHAGEAL ECHOCARDIOGRAM (TEE) (N/A)  He is hemodynamically stable in sinus rhythm. Resume Lopressor.  Volume excess: Some interstitial edema and effusion on CXR. Continue diuresis and replace K+. Keep foley in for now for diuresis.  DM: glucose under control. Continue Levemir and SSI.  OSA: refuses CPAP, observe  Work on IS, ambulation today.   LOS: 2 days    [name] 10/06/2016

## 2016-10-07 LAB — GLUCOSE, CAPILLARY
GLUCOSE-CAPILLARY: 137 mg/dL — AB (ref 65–99)
Glucose-Capillary: 120 mg/dL — ABNORMAL HIGH (ref 65–99)
Glucose-Capillary: 169 mg/dL — ABNORMAL HIGH (ref 65–99)
Glucose-Capillary: 176 mg/dL — ABNORMAL HIGH (ref 65–99)
Glucose-Capillary: 178 mg/dL — ABNORMAL HIGH (ref 65–99)

## 2016-10-07 LAB — BASIC METABOLIC PANEL
Anion gap: 10 (ref 5–15)
BUN: 22 mg/dL — AB (ref 6–20)
CALCIUM: 8.1 mg/dL — AB (ref 8.9–10.3)
CO2: 25 mmol/L (ref 22–32)
CREATININE: 0.85 mg/dL (ref 0.61–1.24)
Chloride: 99 mmol/L — ABNORMAL LOW (ref 101–111)
GFR calc Af Amer: >60 mL/min (ref >60)
GLUCOSE: 175 mg/dL — AB (ref 65–99)
Potassium: 3.8 mmol/L (ref 3.5–5.1)
SODIUM: 134 mmol/L — AB (ref 135–145)

## 2016-10-07 MED ORDER — MOVING RIGHT ALONG BOOK
Freq: Once | Status: AC
  Administered 2016-10-07: 14:00:00
  Filled 2016-10-07: qty 1

## 2016-10-07 MED ORDER — ONDANSETRON HCL 4 MG/2ML IJ SOLN
4.0000 mg | Freq: Four times a day (QID) | INTRAMUSCULAR | Status: DC | PRN
  Administered 2016-10-08: 4 mg via INTRAVENOUS
  Filled 2016-10-07: qty 2

## 2016-10-07 MED ORDER — PANTOPRAZOLE SODIUM 40 MG PO TBEC
40.0000 mg | DELAYED_RELEASE_TABLET | Freq: Every day | ORAL | Status: DC
  Administered 2016-10-08 – 2016-10-09 (×2): 40 mg via ORAL
  Filled 2016-10-07 (×2): qty 1

## 2016-10-07 MED ORDER — OXYCODONE HCL 5 MG PO TABS
5.0000 mg | ORAL_TABLET | ORAL | Status: DC | PRN
  Administered 2016-10-07 – 2016-10-08 (×2): 10 mg via ORAL
  Administered 2016-10-08: 5 mg via ORAL
  Administered 2016-10-08: 10 mg via ORAL
  Administered 2016-10-09: 5 mg via ORAL
  Administered 2016-10-09: 10 mg via ORAL
  Filled 2016-10-07 (×2): qty 1
  Filled 2016-10-07 (×4): qty 2

## 2016-10-07 MED ORDER — METOPROLOL TARTRATE 25 MG PO TABS
25.0000 mg | ORAL_TABLET | Freq: Two times a day (BID) | ORAL | Status: DC
  Administered 2016-10-07 – 2016-10-09 (×4): 25 mg via ORAL
  Filled 2016-10-07 (×4): qty 1

## 2016-10-07 MED ORDER — DOCUSATE SODIUM 100 MG PO CAPS
200.0000 mg | ORAL_CAPSULE | Freq: Every day | ORAL | Status: DC
  Administered 2016-10-08 – 2016-10-09 (×2): 200 mg via ORAL
  Filled 2016-10-07 (×2): qty 2

## 2016-10-07 MED ORDER — BISACODYL 5 MG PO TBEC: 10.0000 mg | DELAYED_RELEASE_TABLET | Freq: Every day | ORAL | Status: DC | PRN

## 2016-10-07 MED ORDER — METFORMIN HCL 500 MG PO TABS
1000.0000 mg | ORAL_TABLET | Freq: Two times a day (BID) | ORAL | Status: DC
Start: 2016-10-07 — End: 2016-10-09
  Administered 2016-10-07 – 2016-10-09 (×5): 1000 mg via ORAL
  Filled 2016-10-07 (×4): qty 2

## 2016-10-07 MED ORDER — SODIUM CHLORIDE 0.9% FLUSH
3.0000 mL | Freq: Two times a day (BID) | INTRAVENOUS | Status: DC
  Administered 2016-10-07 – 2016-10-08 (×2): 3 mL via INTRAVENOUS

## 2016-10-07 MED ORDER — BISACODYL 10 MG RE SUPP: 10.0000 mg | Freq: Every day | RECTAL | Status: DC | PRN

## 2016-10-07 MED ORDER — ASPIRIN EC 325 MG PO TBEC
325.0000 mg | DELAYED_RELEASE_TABLET | Freq: Every day | ORAL | Status: DC
  Administered 2016-10-08 – 2016-10-09 (×2): 325 mg via ORAL
  Filled 2016-10-07 (×2): qty 1

## 2016-10-07 MED ORDER — INSULIN ASPART 100 UNIT/ML ~~LOC~~ SOLN
0.0000 [IU] | Freq: Three times a day (TID) | SUBCUTANEOUS | Status: DC
  Administered 2016-10-07 – 2016-10-08 (×2): 2 [IU] via SUBCUTANEOUS
  Administered 2016-10-08: 12 [IU] via SUBCUTANEOUS
  Administered 2016-10-08 – 2016-10-09 (×2): 8 [IU] via SUBCUTANEOUS

## 2016-10-07 MED ORDER — ACETAMINOPHEN 325 MG PO TABS
650.0000 mg | ORAL_TABLET | Freq: Four times a day (QID) | ORAL | Status: DC | PRN
  Administered 2016-10-07 – 2016-10-08 (×2): 650 mg via ORAL
  Filled 2016-10-07 (×2): qty 2

## 2016-10-07 MED ORDER — POTASSIUM CHLORIDE CRYS ER 20 MEQ PO TBCR
20.0000 meq | EXTENDED_RELEASE_TABLET | Freq: Two times a day (BID) | ORAL | Status: DC
  Administered 2016-10-07 – 2016-10-09 (×4): 20 meq via ORAL
  Filled 2016-10-07 (×4): qty 1

## 2016-10-07 MED ORDER — SODIUM CHLORIDE 0.9 % IV SOLN: 250.0000 mL | INTRAVENOUS | Status: DC | PRN

## 2016-10-07 MED ORDER — ONDANSETRON HCL 4 MG PO TABS: 4.0000 mg | ORAL_TABLET | Freq: Four times a day (QID) | ORAL | Status: DC | PRN

## 2016-10-07 MED ORDER — TRAMADOL HCL 50 MG PO TABS: 50.0000 mg | ORAL_TABLET | ORAL | Status: DC | PRN

## 2016-10-07 MED ORDER — INSULIN DETEMIR 100 UNIT/ML ~~LOC~~ SOLN
30.0000 [IU] | Freq: Every day | SUBCUTANEOUS | Status: DC
  Administered 2016-10-07 – 2016-10-09 (×3): 30 [IU] via SUBCUTANEOUS
  Filled 2016-10-07 (×3): qty 0.3

## 2016-10-07 MED ORDER — SODIUM CHLORIDE 0.9% FLUSH: 3.0000 mL | INTRAVENOUS | Status: DC | PRN

## 2016-10-07 MED ORDER — FUROSEMIDE 40 MG PO TABS
40.0000 mg | ORAL_TABLET | Freq: Every day | ORAL | Status: AC
  Administered 2016-10-08 – 2016-10-09 (×2): 40 mg via ORAL
  Filled 2016-10-07 (×2): qty 1

## 2016-10-07 NOTE — Progress Notes (Signed)
Pt walked over from 2H recently. Will f/u tomorrow. Ethelda Chick CES, ACSM 2:13 PM 10/07/2016

## 2016-10-07 NOTE — Progress Notes (Addendum)
3 Days Post-Op Procedure(s) (LRB): AORTIC VALVE REPLACEMENT (AVR) (N/A) CLIPPING OF ATRIAL APPENDAGE (N/A) TRANSESOPHAGEAL ECHOCARDIOGRAM (TEE) (N/A) Subjective:  Doesn't feel well, sore, didn't sleep, hot and cold, wants to take a shower etc.  Objective: Vital signs in last 24 hours: Temp:  [98 F (36.7 C)-99.6 F (37.6 C)] 98 F (36.7 C) (06/27 2300) Pulse Rate:  [75-122] 108 (06/28 0600) Cardiac Rhythm: Normal sinus rhythm (06/28 0400) Resp:  [0-21] 7 (06/28 0600) BP: (90-140)/(55-92) 124/92 (06/28 0600) SpO2:  [92 %-98 %] 92 % (06/28 0600) Weight:  [98.7 kg (217 lb 9.5 oz)] 98.7 kg (217 lb 9.5 oz) (06/28 0500)  Hemodynamic parameters for last 24 hours:    Intake/Output from previous day: 06/27 0701 - 06/28 0700 In: 1790 [P.O.:960; I.V.:830] Out: 2550 [Urine:2550] Intake/Output this shift: No intake/output data recorded.  General appearance: alert and cooperative Neurologic: intact Heart: regular rate and rhythm, S1, S2 normal, no murmur, click, rub or gallop Lungs: clear to auscultation bilaterally Extremities: extremities normal, atraumatic, no cyanosis or edema Wound: dressing dry  Lab Results:  Recent Labs  10/05/16 1531 10/05/16 1535 10/06/16 0347  WBC 19.0*  --  13.6*  HGB 12.4* 13.6 11.4*  HCT 39.3 40.0 36.1*  PLT 168  --  145*   BMET:  Recent Labs  10/06/16 0347 10/07/16 0508  NA 135 134*  K 3.9 3.8  CL 102 99*  CO2 28 25  GLUCOSE 110* 175*  BUN 16 22*  CREATININE 0.93 0.85  CALCIUM 8.0* 8.1*    PT/INR:  Recent Labs  10/04/16 1239  LABPROT 16.5*  INR 1.32   ABG    Component Value Date/Time   PHART 7.302 (L) 10/04/2016 1818   HCO3 23.8 10/04/2016 1818   TCO2 22 10/05/2016 1535   ACIDBASEDEF 3.0 (H) 10/04/2016 1818   O2SAT 93.0 10/04/2016 1818   CBG (last 3)   Recent Labs  10/06/16 1936 10/06/16 2351 10/07/16 0454  GLUCAP 245* 120* 178*    Assessment/Plan: S/P Procedure(s) (LRB): AORTIC VALVE REPLACEMENT (AVR)  (N/A) CLIPPING OF ATRIAL APPENDAGE (N/A) TRANSESOPHAGEAL ECHOCARDIOGRAM (TEE) (N/A)  He is hemodynamically stable in sinus rhythm. Continue Lopressor.  Mild volume excess: will continue diuresis  DM: glucose still a little higher than ideal but much better than at home. Continue Levemir and SSI. Can resume glucophage.  DC sleeve and foley  Transfer to 2W and continue ambulation, IS.   LOS: 3 days    [name] 10/07/2016

## 2016-10-07 NOTE — Progress Notes (Signed)
Pt arrived from [facility]. Pt ambulated the entire way here. VSS. No complaints of pain at this time. Family at bedside. Call bell and phone within reach.

## 2016-10-08 ENCOUNTER — Inpatient Hospital Stay (HOSPITAL_COMMUNITY): Payer: Medicare Other

## 2016-10-08 LAB — GLUCOSE, CAPILLARY
GLUCOSE-CAPILLARY: 150 mg/dL — AB (ref 65–99)
GLUCOSE-CAPILLARY: 196 mg/dL — AB (ref 65–99)
Glucose-Capillary: 289 mg/dL — ABNORMAL HIGH (ref 65–99)
Glucose-Capillary: 223 mg/dL — ABNORMAL HIGH (ref 65–99)

## 2016-10-08 LAB — BASIC METABOLIC PANEL
ANION GAP: 7 (ref 5–15)
BUN: 19 mg/dL (ref 6–20)
CO2: 27 mmol/L (ref 22–32)
CREATININE: 0.92 mg/dL (ref 0.61–1.24)
Calcium: 8.1 mg/dL — ABNORMAL LOW (ref 8.9–10.3)
Chloride: 102 mmol/L (ref 101–111)
GFR calc non Af Amer: >60 mL/min (ref >60)
Glucose, Bld: 134 mg/dL — ABNORMAL HIGH (ref 65–99)
Potassium: 3.7 mmol/L (ref 3.5–5.1)
SODIUM: 136 mmol/L (ref 135–145)

## 2016-10-08 LAB — CBC
HEMATOCRIT: 32.9 % — AB (ref 39.0–52.0)
Hemoglobin: 10.6 g/dL — ABNORMAL LOW (ref 13.0–17.0)
MCH: 25.9 pg — ABNORMAL LOW (ref 26.0–34.0)
MCHC: 32.2 g/dL (ref 30.0–36.0)
MCV: 80.4 fL (ref 78.0–100.0)
PLATELETS: 201 10*3/uL (ref 150–400)
RBC: 4.09 MIL/uL — AB (ref 4.22–5.81)
RDW: 14 % (ref 11.5–15.5)
WBC: 10 10*3/uL (ref 4.0–10.5)

## 2016-10-08 IMAGING — DX DG CHEST 2V
2 series · 2 of 2 positions shown · non-contrast
Comparison: [DATE]

CLINICAL DATA: Status post aortic valve replacement

EXAM:
CHEST  2 VIEW

[chest pa]
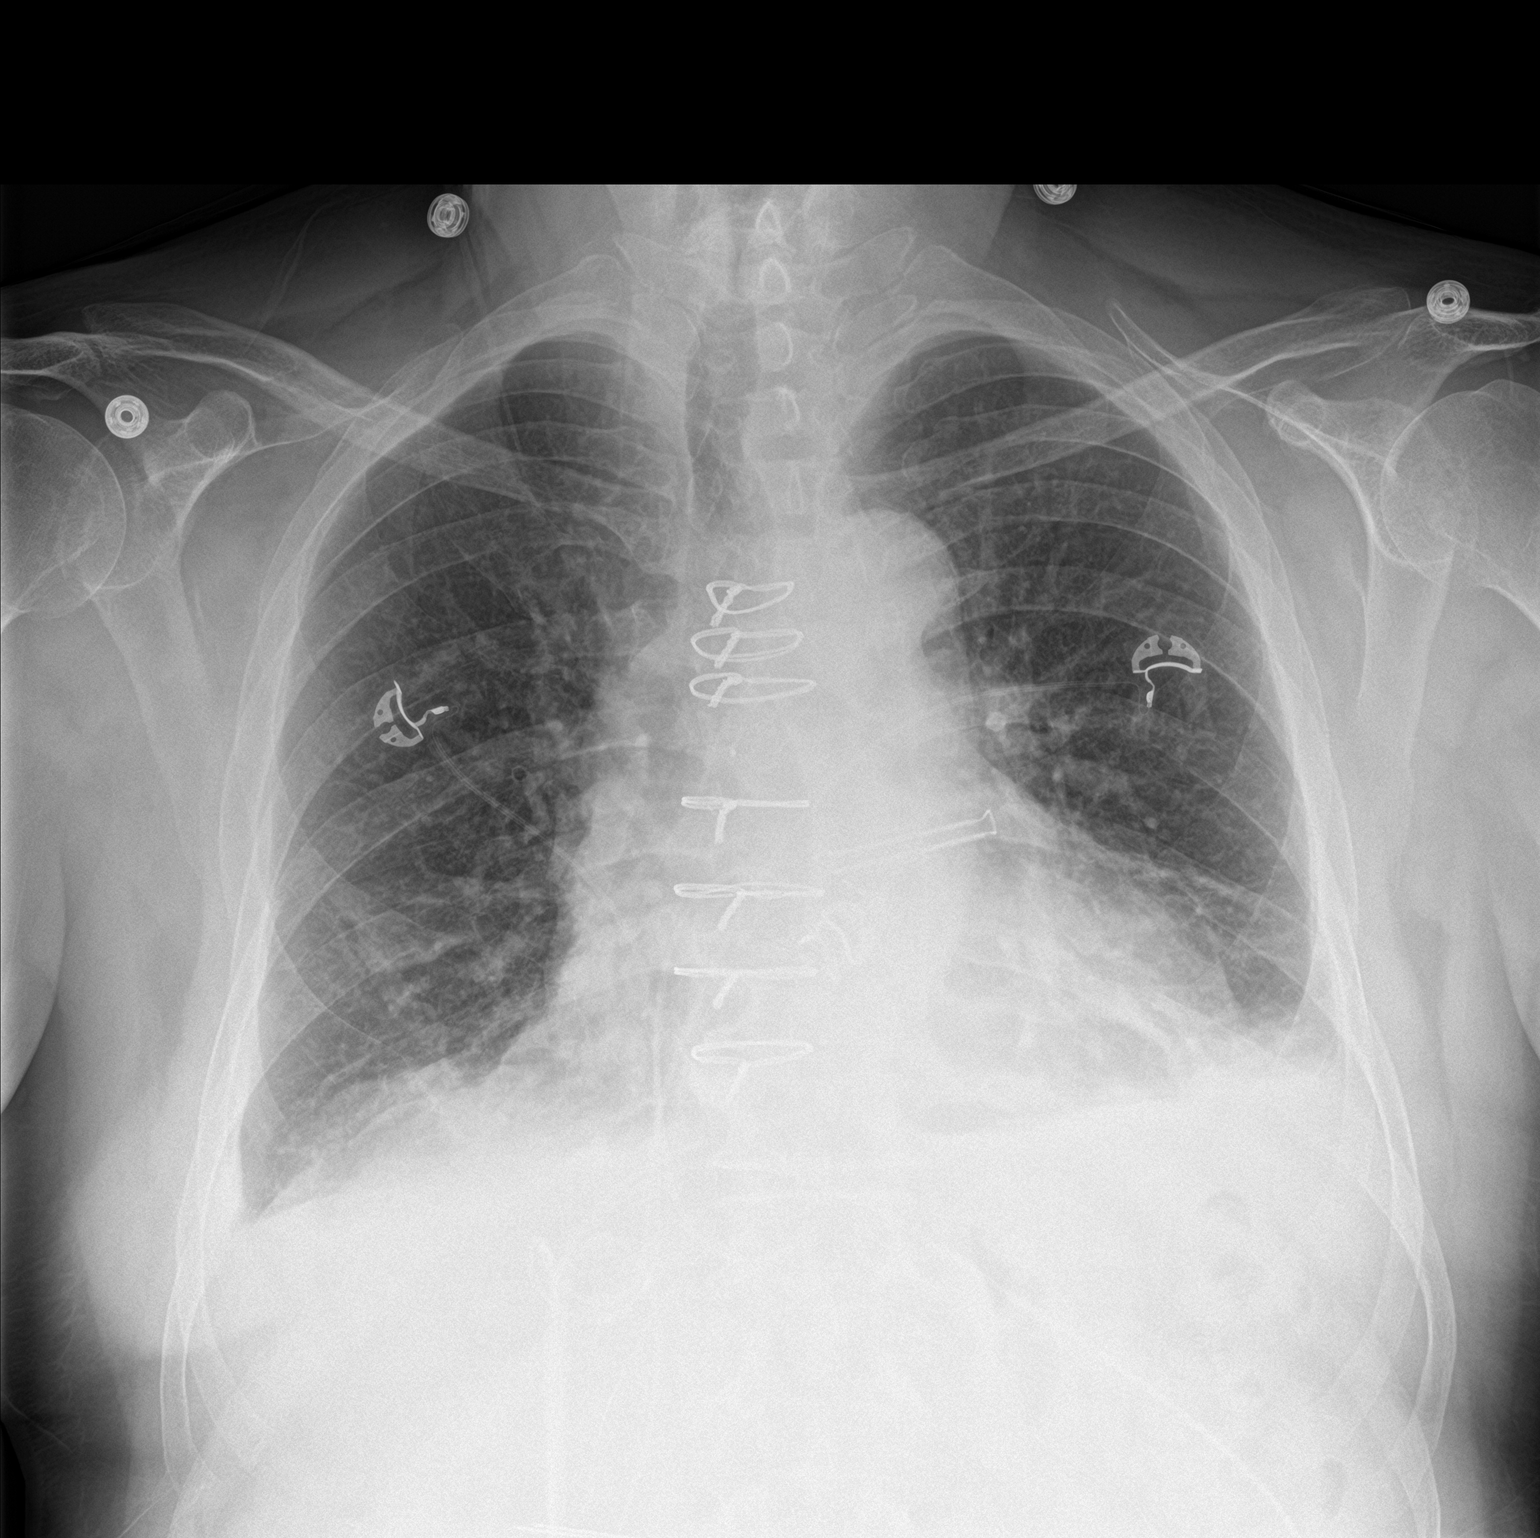

[chest lat]
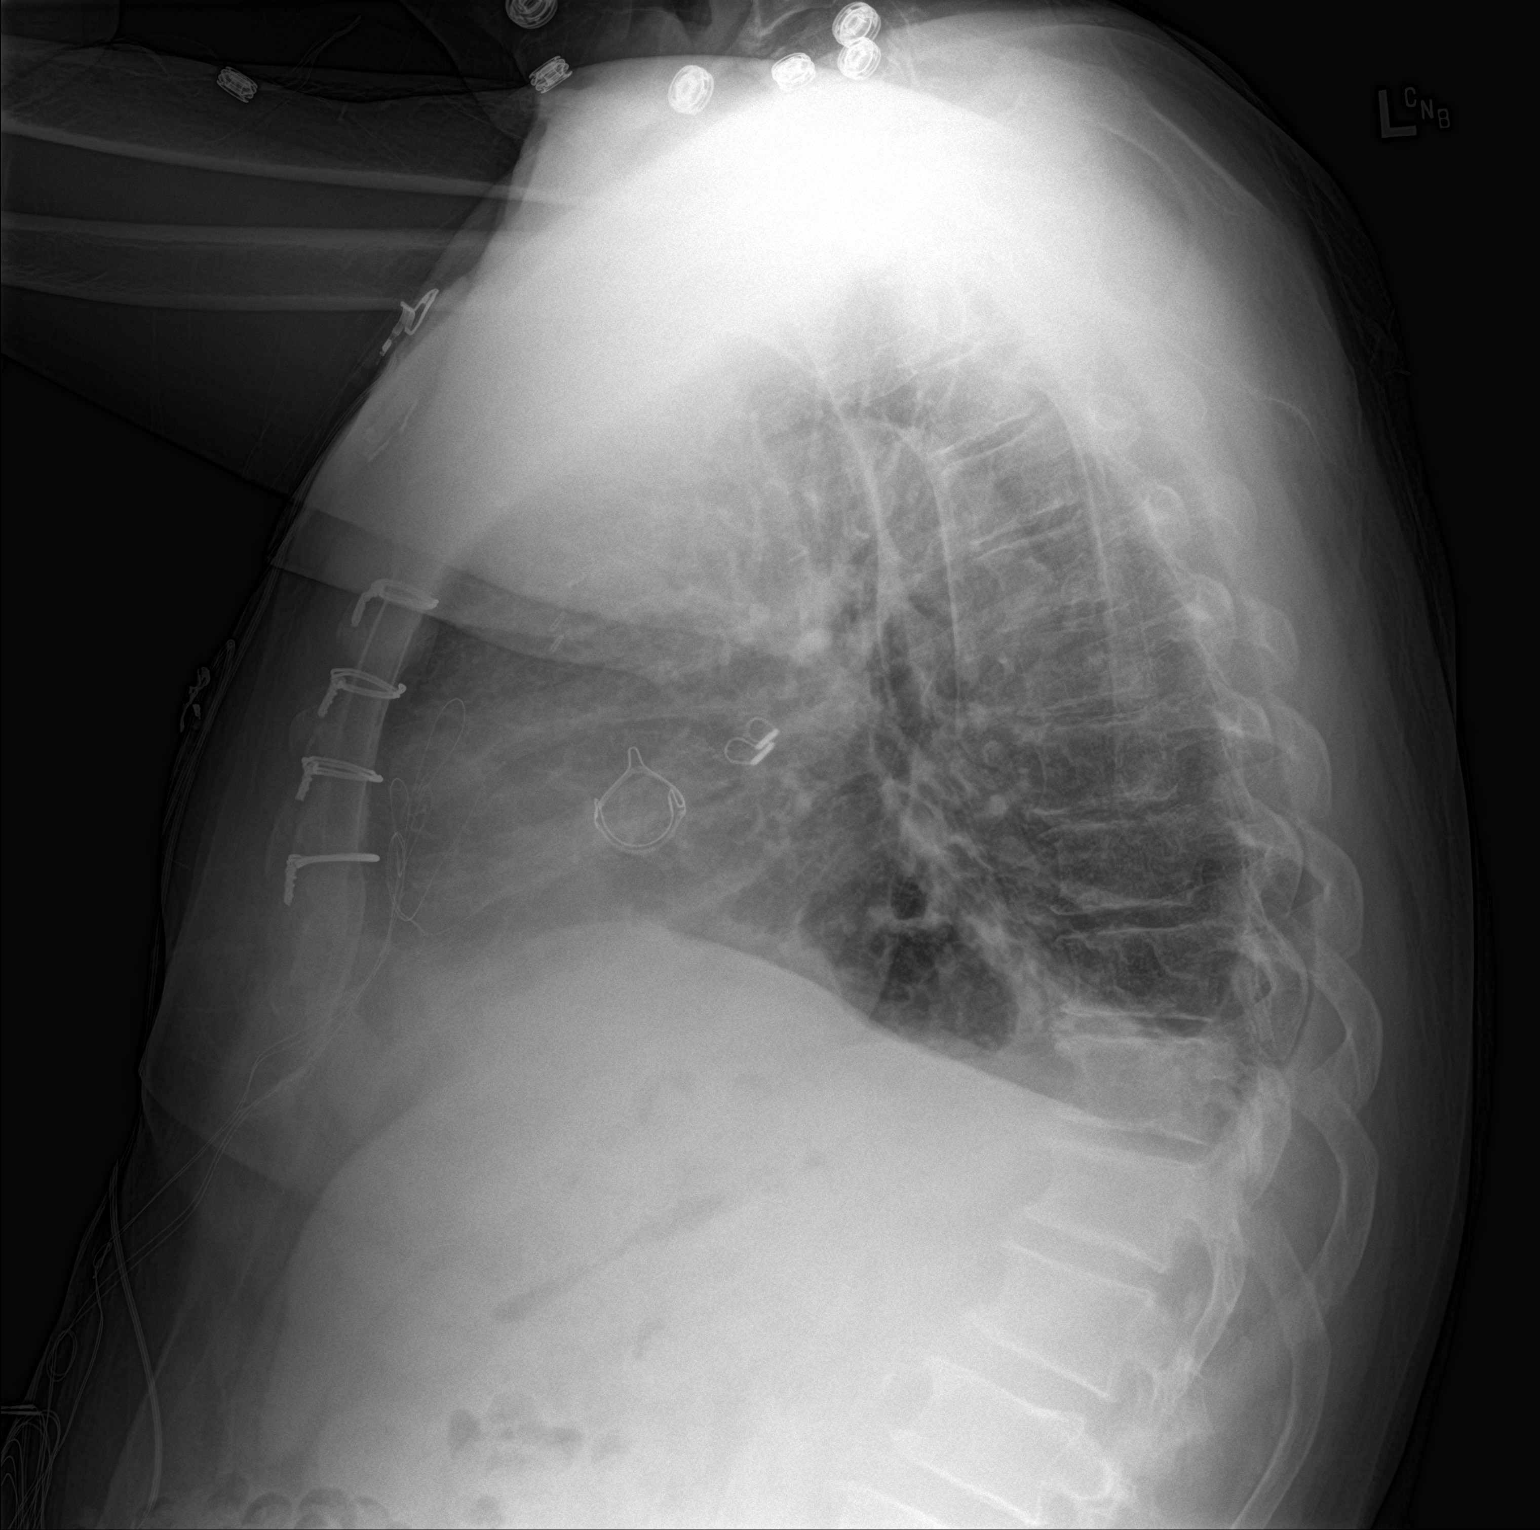

[2 of 2 positions shown; findings below may reference images not displayed]

FINDINGS: Postsurgical changes are again seen and stable. Cardiac shadow
remains enlarged. Bibasilar atelectatic changes are again seen with
small pleural effusions. Right jugular sheath has been removed. No
pneumothorax is noted. No bony abnormality is seen. Resolution of
previously seen vascular congestion is noted.
IMPRESSION: Postsurgical changes with bibasilar atelectatic changes.

## 2016-10-08 MED ORDER — LISINOPRIL 5 MG PO TABS
5.0000 mg | ORAL_TABLET | Freq: Every day | ORAL | Status: DC
  Administered 2016-10-08: 5 mg via ORAL
  Filled 2016-10-08: qty 1

## 2016-10-08 MED ORDER — LOSARTAN POTASSIUM 25 MG PO TABS
25.0000 mg | ORAL_TABLET | Freq: Every day | ORAL | Status: DC
Start: 2016-10-08 — End: 2016-10-09
  Administered 2016-10-08 – 2016-10-09 (×2): 25 mg via ORAL
  Filled 2016-10-08: qty 1

## 2016-10-08 MED ORDER — ASPIRIN EC 81 MG PO TBEC: 81.0000 mg | DELAYED_RELEASE_TABLET | Freq: Every day | ORAL | Status: DC

## 2016-10-08 MED ORDER — METOPROLOL TARTRATE 25 MG PO TABS: 25.0000 mg | ORAL_TABLET | Freq: Two times a day (BID) | ORAL | 1 refills | Status: DC

## 2016-10-08 MED ORDER — LOSARTAN POTASSIUM 50 MG PO TABS: 50.0000 mg | ORAL_TABLET | Freq: Every day | ORAL | Status: DC

## 2016-10-08 MED ORDER — LOSARTAN POTASSIUM 25 MG PO TABS: 25.0000 mg | ORAL_TABLET | Freq: Every day | ORAL | 1 refills | Status: DC

## 2016-10-08 MED ORDER — OXYCODONE HCL 5 MG PO TABS: ORAL_TABLET | ORAL | 0 refills | Status: DC

## 2016-10-08 MED ORDER — ACETAMINOPHEN 325 MG PO TABS: 650.0000 mg | ORAL_TABLET | Freq: Four times a day (QID) | ORAL | Status: AC | PRN

## 2016-10-08 NOTE — Care Management Important Message (Signed)
Important Message  Patient Details  Name: Vernon Ruiz MRN: [id number] Date of Birth: [birth year]   Medicare Important Message Given:  Yes    Kyla Balzarine 10/08/2016, 10:37 AM

## 2016-10-08 NOTE — Progress Notes (Signed)
CARDIAC REHAB PHASE I   PRE:  Rate/Rhythm: 83 SR    BP: sitting 131/83    SaO2: 96 RA  MODE:  Ambulation: 260 ft   POST:  Rate/Rhythm: 93 SR    BP: sitting 160/80     SaO2: 98 RA  Pt is groggy and in pain, mostly in his back. Sts having EPW pulled "felt funny". Pt seems very sleepy although he sts he slept ok. Able to ambulate without RW, rest x1. Return to EOB and leaned on bedside table. Pt seems rather uncomfortable and I asked RN for pain meds. Ed completed with pt, wife, brother, and friend. Pt struggled to pay attention but family attentive. Discussed better glycemic control. Will refer to [place] [facility].  [id number]   Harriet Masson CES, ACSM 10/08/2016 11:50 AM

## 2016-10-08 NOTE — Assessment & Plan Note (Signed)
Edwards Magna-Ease pericardial valve, size 39mm.

## 2016-10-08 NOTE — Progress Notes (Signed)
Gave patient Moving Right Along Book

## 2016-10-08 NOTE — Discharge Planning (Deleted)
Physician Discharge Summary       [address] [place]       [place] [address]             [phone]    Patient ID: Vernon Ruiz MRN: [id number] DOB/AGE: 65-26-1952 65 y.o.  Admit date: 10/04/2016 Discharge date: 10/09/2016  Admission Diagnoses: Severe aortic stenosis  Discharge Diagnoses:  1. Persistent atrial fibrillation (HCC) 2. Diabetes mellitus without complication (HCC) 3. Hypertension 4. Obstructive sleep apnea-noncompliant with CPAP 5. Tobacco abuse 6. ABL anemia  Procedure (s):  1. Median Sternotomy 2. Extracorporeal circulation 3.   Aortic valve replacement using a 23mm Edwards Magna-Ease pericardial valve. 4.  Clipping of left atrial appendage by Dr. Laneta Simmers on 10/04/2016.  History of Presenting Illness: The patient is a 66 year old gentleman with type 2 IDDM, hypertension, OSA, PAF on Eliquis and known aortic stenosis. He had presented with some shortness of breath and chest pain in 2015 and was noted to be in atrial fib with RVR. Echo at that time showed moderate AS with a mean gradient of 28 mm Hg. He says that since then he has had no palpitations and if he is having atrial fib he never knows it. He was seen by Dr. Johney Frame in 01/2016 for a second opinion about his atrial fib. His ECG' s had mostly shown sinus rhythm and no further treatment recommended. His echo in 01/2016 showed severe AS with a mean gradient of 48 mm Hg and a peak of 91 mm Hg. His LVEF was 60-65%. Surgery was recommended and he underwent cath on 03/10/2016 showing diffuse non-obstructive CAD with moderate AS with a mean AV gradient of 28.7 mm Hg, a peak of 29 mm Hg and an AVA of 1.2 cm2. His symptoms were only chest pressure with heavy exertion and he decided to hold off on surgery for a while. He had a follow up echo on 09/13/2016 which showed a mean gradient of 38 mm Hg with a peak of 66 mm Hg. The DI was 0.25 and indexed valve area 0.34 cm2/m2. He reports that his symptoms have not  changed. He has no shortness of breath, dizziness, syncope, or fatigue. He only gets some substernal chest pressure with heavy exertion, relieved with rest.   This 66 year old gentleman has stage D, severe, symptomatic aortic stenosis with his only symptom being substernal chest pressure with heavy exertion, NYHA class 1. Dr. Laneta Simmers personally reviewed his serial echos and cath images. His echos show progressive AS with a mean gradient of 48 mm Hg in 01/2016. His most recent echo this week shows a mean gradient of 38 mm Hg but his aortic valve is trileaflet, severely calcified and poorly mobile consistent with severe AS. Dr. Laneta Simmers thought AVR is indicated in this patient who will soon become more symptomatic. He did not think he is a TAVR candidate given his low risk for open surgical AVR. He does have a history of PAF but all of the ECG's that Dr. Laneta Simmers saw in [facility] show sinus rhythm and he denies any palpitations. He didn't think he would perform a MAZE in this patient given this history and his obesity which makes exposure for performing a MAZE more difficult and probably less complete. He discussed the choices for valve replacement prosthesis including mechanical and bioprosthetic valves. Given his age of 66 He thought a bioprosthetic valve would be best so that he does not have to go on Coumadin for the rest of his  life and could be maintained on Eliquis for his atrial fib history as long as he tolerates it. Carotid duplex US showed no significant internal carotid artery stenosis bilaterally. Patient was admitted on 10/04/2016 for an AVR and clipping of LA appendage.  Brief Hospital Course:  The patient was extubated the evening of surgery without difficulty. He remained afebrile and hemodynamically stable. Theone Murdoch, a line, chest tubes, and foley were removed early in the post operative course. Lopressor was started and titrated accordingly. He was volume over loaded and diuresed. He had ABL anemia.  He did not require a post op transfusion. Last H and H was 10.6 and 32.9. He also had thrombocytopenia. He was weaned off the insulin drip.  Once he was tolerating a diet, Metformin was restarted.  The patient's glucose remained well controlled.The patient's HGA1C pre op was 9.5 . He will need close follow up with medical doctor after discharge. The patient was felt surgically stable for transfer from the ICU to PCTU for further convalescence on 10/07/2016. He continues to progress with cardiac rehab. He was ambulating on room air. He has been tolerating a diet and has had a bowel movement. He was restarted on low dose Losartan. Based on his blood pressure, we will resume home dose of 50 mg of Losartan at dischargeEpicardial pacing wires were removed on 10/08/2016. Chest tube sutures will be removed in the office after discharge. The patient is felt surgically stable for discharge today.   Latest Vital Signs: Blood pressure 137/65, pulse 77, temperature 98.3 F (36.8 C), temperature source Oral, resp. rate 18, height 5\' 5"  (1.651 m), weight 94.4 kg (208 lb 3.2 oz), SpO2 95 %.  Physicial Exam:  General appearance: alert and cooperative Neurologic: intact Heart: regular rate and rhythm, S1, S2 normal, no murmur, click, rub or gallop Lungs: clear to auscultation bilaterally Extremities: extremities normal, atraumatic, no cyanosis or edema Wound: incisions looks good.  Discharge Condition:Stable and discharged to home.  Recent laboratory studies:  Lab Results  Component Value Date   WBC 10.0 10/08/2016   HGB 10.6 (L) 10/08/2016   HCT 32.9 (L) 10/08/2016   MCV 80.4 10/08/2016   PLT 201 10/08/2016   Lab Results  Component Value Date   NA 136 10/08/2016   K 3.7 10/08/2016   CL 102 10/08/2016   CO2 27 10/08/2016   CREATININE 0.92 10/08/2016   GLUCOSE 134 (H) 10/08/2016    Diagnostic Studies: Dg Chest 2 View  Result Date: 10/08/2016 CLINICAL DATA:  Status post aortic valve replacement  EXAM: CHEST  2 VIEW COMPARISON:  10/06/2016 FINDINGS: Postsurgical changes are again seen and stable. Cardiac shadow remains enlarged. Bibasilar atelectatic changes are again seen with small pleural effusions. Right jugular sheath has been removed. No pneumothorax is noted. No bony abnormality is seen. Resolution of previously seen vascular congestion is noted. IMPRESSION: Postsurgical changes with bibasilar atelectatic changes. Electronically Signed   By: Alcide Clever M.D.   On: 10/08/2016 07:27    Discharge Instructions    Amb Referral to Cardiac Rehabilitation    Complete by:  As directed    Diagnosis:  Valve Replacement   Valve:  Aortic   Discharge patient    Complete by:  As directed    Discharge disposition:  01-Home or Self Care   Discharge patient date:  10/09/2016     Discharge Medications: Allergies as of 10/09/2016      Reactions   Penicillins Itching, Rash   All over body Has patient  had a PCN reaction causing immediate rash, facial/tongue/throat swelling, SOB or lightheadedness with hypotension: yes Has patient had a PCN reaction causing severe rash involving mucus membranes or skin necrosis: no Has patient had a PCN reaction that required hospitalization no Has patient had a PCN reaction occurring within the last 10 years: no If all of the above answers are "NO", then may proceed with Cephalosporin use.   Sulfa Antibiotics Rash   Rash   Sulfamethoxazole Rash      Medication List    STOP taking these medications   CARTIA XT 120 MG 24 hr capsule Generic drug:  diltiazem     TAKE these medications   acetaminophen 325 MG tablet Commonly known as:  TYLENOL Take 2 tablets (650 mg total) by mouth every 6 (six) hours as needed for mild pain.   aspirin EC 81 MG tablet Take 1 tablet (81 mg total) by mouth daily.   ELIQUIS 5 MG Tabs tablet Generic drug:  apixaban Take 5 mg by mouth 2 (two) times daily.   losartan 50 MG tablet Commonly known as:  COZAAR Take 1 tablet  (50 mg total) by mouth daily. What changed:  medication strength   metFORMIN 1000 MG tablet Commonly known as:  GLUCOPHAGE Take 1,000 mg by mouth 2 (two) times daily.   metoprolol tartrate 25 MG tablet Commonly known as:  LOPRESSOR Take 1 tablet (25 mg total) by mouth 2 (two) times daily.   oxyCODONE 5 MG immediate release tablet Commonly known as:  Oxy IR/ROXICODONE Take 5 mg by mouth every 4-6 hours PRN severe pain   TOUJEO SOLOSTAR [place] Inject 50 Units into the skin every evening.   TRULICITY 1.5 MG/0.5ML Sopn Generic drug:  Dulaglutide Inject 1.5 mg into the skin every Tuesday.   VITAMIN B 12 PO Take 1 tablet by mouth every other day.      The patient has been discharged on:   1.Beta Blocker:  Yes [  x ]                              No   [   ]                              If No, reason:  2.Ace Inhibitor/ARB: Yes [ x  ]                                     No  [    ]                                     If No, reason:  3.Statin:   Yes [   ]                  No  [x   ]                  If No, reason: No CAD  4.Marlowe Kays:  Yes  [ x  ]                  No   [   ]  If No, reason:  Follow Up Appointments: Follow-up Information    [name], MD Follow up on 11/17/2016.   Specialty:  Cardiothoracic Surgery Why:  PA/LAT CXR to be taken (at [facility] which is in the same building as Dr. Sharee Pimple office) on 11/17/2016 at 10:00 am ;Appointment time is at 10:30 am Contact information: [address] [place] [address] [phone]        Nurse Follow up on 10/19/2016.   Why:  Appointment is for chest tube suture removal only. Appointment time is at 10:30 am Contact information: [address] [employer] [address] [place] [address]       Manson Passey, [place] Follow up on 10/27/2016.   Specialty:  Cardiology Why:  Appointment time is at 11:00 am Contact information: [address] [place] [address] [place]  [address] [phone]           Signed: Orson Ape 10/09/2016, 7:37 AM

## 2016-10-08 NOTE — Progress Notes (Signed)
EPW d/c'd per order and per protocol. VSS. Tips intact. Pt tolerated well. Pt and family educated on need for q61m vitals and bedrest x1h. Call bell and phone within reach. Will continue to monitor.

## 2016-10-08 NOTE — Progress Notes (Addendum)
      [address]       [place] [address]             [phone]        4 Days Post-Op Procedure(s) (LRB): AORTIC VALVE REPLACEMENT (AVR) (N/A) CLIPPING OF ATRIAL APPENDAGE (N/A) TRANSESOPHAGEAL ECHOCARDIOGRAM (TEE) (N/A)  Subjective: Patient is walking a lot and has had a bowel movement. He is requesting to go home today.  Objective: Vital signs in last 24 hours: Temp:  [97.6 F (36.4 C)-98.5 F (36.9 C)] 98.3 F (36.8 C) (06/29 0335) Pulse Rate:  [81-93] 87 (06/29 0335) Cardiac Rhythm: Normal sinus rhythm (06/29 0700) Resp:  [14-22] 20 (06/29 0335) BP: (116-145)/(67-79) 145/79 (06/29 0504) SpO2:  [95 %-100 %] 99 % (06/29 0335) Weight:  [96.8 kg (213 lb 8 oz)-98.7 kg (217 lb 9.5 oz)] 96.8 kg (213 lb 8 oz) (06/29 0335)  Pre op weight 100 kg Current Weight  10/08/16 96.8 kg (213 lb 8 oz)   Intake/Output from previous day: 06/28 0701 - 06/29 0700 In: 660 [P.O.:600; I.V.:60] Out: -    Physical Exam:  Cardiovascular: RRR, no murmur. Pulmonary: Clear to auscultation bilaterally Abdomen: Soft, non tender, bowel sounds present. Extremities: Trace lower extremity edema. Wound: Clean and dry.  No erythema or signs of infection.  Lab Results: CBC: Recent Labs  10/06/16 0347 10/08/16 0212  WBC 13.6* 10.0  HGB 11.4* 10.6*  HCT 36.1* 32.9*  PLT 145* 201   BMET:  Recent Labs  10/07/16 0508 10/08/16 0212  NA 134* 136  K 3.8 3.7  CL 99* 102  CO2 25 27  GLUCOSE 175* 134*  BUN 22* 19  CREATININE 0.85 0.92  CALCIUM 8.1* 8.1*    PT/INR:  Lab Results  Component Value Date   INR 1.32 10/04/2016   INR 0.90 09/30/2016   INR 1.01 03/10/2016   ABG:  INR: Will add last result for INR, ABG once components are confirmed Will add last 4 CBG results once components are confirmed  Assessment/Plan:  1. CV - Brief run of NSVT earlier this am. On Lopressor 25 mg bid. Has a history of PAF and was on Eliquis prior to surgery. Will restart at  discharge. SBP in the 140's. Will restart low dose Losartan for better BP control.  2.  Pulmonary - On room air. Encourage incentive spirometer. 3. Volume Overload - On Lasix 40 mg daily. Will stop after today's dose 4.  Acute blood loss anemia - H and H this am 10.6 and 32.9 5. Supplement potassium 6. Remove EPW 7. DM-CBGs 169/176/150. On Metformin 1000 mg bid and Insulin. Pre op HGA1C 9.5. Will need close medical follow up after discharge. 8. Remove EPW 9. As discussed with Dr. Laneta Simmers, discharge in am   ZIMMERMAN,DONIELLE MPA-C 10/08/2016,9:17 AM   Chart reviewed, patient examined, agree with above. He looks good and feels well. Plan to send home tomorrow. Resume Eliquis at discharge with ASA 81 mg.

## 2016-10-08 NOTE — Progress Notes (Signed)
Patient sleeping and had a short run of SVT tate 140-145 then back to S.R. Cont. To monitor pt. And rhythm

## 2016-10-09 ENCOUNTER — Other Ambulatory Visit: Payer: Self-pay | Admitting: Physician Assistant

## 2016-10-09 LAB — GLUCOSE, CAPILLARY: Glucose-Capillary: 204 mg/dL — ABNORMAL HIGH (ref 65–99)

## 2016-10-09 NOTE — Progress Notes (Signed)
5 Days Post-Op Procedure(s) (LRB): AORTIC VALVE REPLACEMENT (AVR) (N/A) CLIPPING OF ATRIAL APPENDAGE (N/A) TRANSESOPHAGEAL ECHOCARDIOGRAM (TEE) (N/A) Subjective: No complaints, anxious to go home.  Objective: Vital signs in last 24 hours: Temp:  [97.9 F (36.6 C)-98.3 F (36.8 C)] 98.3 F (36.8 C) (06/30 0303) Pulse Rate:  [75-87] 77 (06/30 0303) Cardiac Rhythm: Normal sinus rhythm (06/30 0700) Resp:  [18] 18 (06/30 0303) BP: (117-169)/(65-88) 137/65 (06/30 0303) SpO2:  [95 %] 95 % (06/30 0303) Weight:  [94.4 kg (208 lb 3.2 oz)] 94.4 kg (208 lb 3.2 oz) (06/30 0447)  Hemodynamic parameters for last 24 hours:    Intake/Output from previous day: 06/29 0701 - 06/30 0700 In: 840 [P.O.:840] Out: 1 [Emesis/NG output:1] Intake/Output this shift: No intake/output data recorded.  General appearance: alert and cooperative Neurologic: intact Heart: regular rate and rhythm, S1, S2 normal, no murmur, click, rub or gallop Lungs: clear to auscultation bilaterally Extremities: extremities normal, atraumatic, no cyanosis or edema Wound: incisions looks good  Lab Results:  Recent Labs  10/08/16 0212  WBC 10.0  HGB 10.6*  HCT 32.9*  PLT 201   BMET:  Recent Labs  10/07/16 0508 10/08/16 0212  NA 134* 136  K 3.8 3.7  CL 99* 102  CO2 25 27  GLUCOSE 175* 134*  BUN 22* 19  CREATININE 0.85 0.92  CALCIUM 8.1* 8.1*    PT/INR: No results for input(s): LABPROT, INR in the last 72 hours. ABG    Component Value Date/Time   PHART 7.302 (L) 10/04/2016 1818   HCO3 23.8 10/04/2016 1818   TCO2 22 10/05/2016 1535   ACIDBASEDEF 3.0 (H) 10/04/2016 1818   O2SAT 93.0 10/04/2016 1818   CBG (last 3)   Recent Labs  10/08/16 1716 10/08/16 2221 10/09/16 0609  GLUCAP 289* 223* 204*    Assessment/Plan: S/P Procedure(s) (LRB): AORTIC VALVE REPLACEMENT (AVR) (N/A) CLIPPING OF ATRIAL APPENDAGE (N/A) TRANSESOPHAGEAL ECHOCARDIOGRAM (TEE) (N/A)  He is doing well overall and ready to go  home. His glucose still gets up in the 200's but that is where it usually was preop. He should resume previous meds and needs close attention to diet. He is not very compliant with diet. He will need to follow up with his primary physician. Plan to resume Eliquis at discharge with ASA 81 mg. Follow up in office to remove chest tube sutures.   LOS: 5 days    [name] 10/09/2016

## 2016-10-09 NOTE — Discharge Instructions (Signed)
Aortic Valve Replacement, Care After °Refer to this sheet in the next few weeks. These instructions provide you with information about caring for yourself after your procedure. Your health care provider may also give you more specific instructions. Your treatment has been planned according to current medical practices, but problems sometimes occur. Call your health care provider if you have any problems or questions after your procedure. °What can I expect after the procedure? °After the procedure, it is common to have: °· Pain around your incision area. °· A small amount of blood or clear fluid coming from your incision. ° °Follow these instructions at home: °Eating and drinking ° °· Follow instructions from your health care provider about eating or drinking restrictions. °? Limit alcohol intake to no more than 1 drink per day for nonpregnant women and 2 drinks per day for men. One drink equals 12 oz of beer, 5 oz of wine, or 1½ oz of hard liquor. °? Limit how much caffeine you drink. Caffeine can affect your heart's rate and rhythm. °· Drink enough fluid to keep your urine clear or pale yellow. °· Eat a heart-healthy diet. This should include plenty of fresh fruits and vegetables. If you eat meat, it should be lean cuts. Avoid foods that are: °? High in salt, saturated fat, or sugar. °? Canned or highly processed. °? Fried. °Activity °· Return to your normal activities as told by your health care provider. Ask your health care provider what activities are safe for you. °· Exercise regularly once you have recovered, as told by your health care provider. °· Avoid sitting for more than 2 hours at a time without moving. Get up and move around at least once every 1-2 hours. This helps to prevent blood clots in the legs. °· Do not lift anything that is heavier than 10 lb (4.5 kg) until your health care provider approves. °· Avoid pushing or pulling things with your arms until your health care provider approves. This  includes pulling on handrails to help you climb stairs. °Incision care ° °· Follow instructions from your health care provider about how to take care of your incision. Make sure you: °? Wash your hands with soap and water before you change your bandage (dressing). If soap and water are not available, use hand sanitizer. °? Change your dressing as told by your health care provider. °? Leave stitches (sutures), skin glue, or adhesive strips in place. These skin closures may need to stay in place for 2 weeks or longer. If adhesive strip edges start to loosen and curl up, you may trim the loose edges. Do not remove adhesive strips completely unless your health care provider tells you to do that. °· Check your incision area every day for signs of infection. Check for: °? More redness, swelling, or pain. °? More fluid or blood. °? Warmth. °? Pus or a bad smell. °Medicines °· Take over-the-counter and prescription medicines only as told by your health care provider. °· If you were prescribed an antibiotic medicine, take it as told by your health care provider. Do not stop taking the antibiotic even if you start to feel better. °Travel °· Avoid airplane travel for as long as told by your health care provider. °· When you travel, bring a list of your medicines and a record of your medical history with you. Carry your medicines with you. °Driving °· Ask your health care provider when it is safe for you to drive. Do not drive until your health   care provider approves. °· Do not drive or operate heavy machinery while taking prescription pain medicine. °Lifestyle ° °· Do not use any tobacco products, such as cigarettes, chewing tobacco, or e-cigarettes. If you need help quitting, ask your health care provider. °· Resume sexual activity as told by your health care provider. Do not use medicines for erectile dysfunction unless your health care provider approves, if this applies. °· Work with your health care provider to keep your  blood pressure and cholesterol under control, and to manage any other heart conditions that you have. °· Maintain a healthy weight. °General instructions °· Do not take baths, swim, or use a hot tub until your health care provider approves. °· Do not strain to have a bowel movement. °· Avoid crossing your legs while sitting down. °· Check your temperature every day for a fever. A fever may be a sign of infection. °· If you are a woman and you plan to become pregnant, talk with your health care provider before you become pregnant. °· Wear compression stockings if your health care provider instructs you to do this. These stockings help to prevent blood clots and reduce swelling in your legs. °· Tell all health care providers who care for you that you have an artificial (prosthetic) aortic valve. If you have or have had heart disease or endocarditis, tell all health care providers about these conditions as well. °· Keep all follow-up visits as told by your health care provider. This is important. °Contact a health care provider if: °· You develop a skin rash. °· You experience sudden, unexplained changes in your weight. °· You have more redness, swelling, or pain around your incision. °· You have more fluid or blood coming from your incision. °· Your incision feels warm to the touch. °· You have pus or a bad smell coming from your incision. °· You have a fever. °Get help right away if: °· You develop chest pain that is different from the pain coming from your incision. °· You develop shortness of breath or difficulty breathing. °· You start to feel light-headed. °These symptoms may represent a serious problem that is an emergency. Do not wait to see if the symptoms will go away. Get medical help right away. Call your local emergency services (911 in the [place] Do not drive yourself to the hospital. °This information is not intended to replace advice given to you by your health care provider. Make sure you discuss any  questions you have with your health care provider. °Document Released: 10/15/2004 Document Revised: 09/04/2015 Document Reviewed: 03/02/2015 °Elsevier Interactive Patient Education © 2017 [employer]. ° °

## 2016-10-11 MED FILL — Heparin Sodium (Porcine) Inj 1000 Unit/ML: INTRAMUSCULAR | Qty: 2500 | Status: AC

## 2016-10-11 NOTE — Progress Notes (Signed)
Patient has uncontrolled diabetes with hyperglycemia

## 2016-10-12 ENCOUNTER — Telehealth: Payer: Self-pay | Admitting: Surgical

## 2016-10-12 NOTE — Telephone Encounter (Signed)
[address]       [place] [address]             [phone]    Vernon Ruiz [id number]  CARDIOVASCULAR SURGERY OPERATIVE NOTE  10/04/2016 TRAMOND MAROTTE [id number]  Surgeon:  [name], MD  First Assistant: Jari Favre,  PA-C   Preoperative Diagnosis:  Severe aortic stenosis, hx of paroxysmal atrial fibrillation   Postoperative Diagnosis:  Same   Procedure:  1. Median Sternotomy 2. Extracorporeal circulation 3.   Aortic valve replacement using a 69mm Edwards Magna-Ease pericardial valve. 4.  Clipping of left atrial appendage   Anesthesia:  General Endotracheal   Discharged home on 10/08/16  Medications: Allergies as of 10/12/2016      Reactions   Penicillins Itching, Rash   All over body Has patient had a PCN reaction causing immediate rash, facial/tongue/throat swelling, SOB or lightheadedness with hypotension: yes Has patient had a PCN reaction causing severe rash involving mucus membranes or skin necrosis: no Has patient had a PCN reaction that required hospitalization no Has patient had a PCN reaction occurring within the last 10 years: no If all of the above answers are "NO", then may proceed with Cephalosporin use.   Sulfa Antibiotics Rash   Rash   Sulfamethoxazole Rash      Medication List       Accurate as of 10/12/16 10:58 AM. Always use your most recent med list.          acetaminophen 325 MG tablet Commonly known as:  TYLENOL Take 2 tablets (650 mg total) by mouth every 6 (six) hours as needed for mild pain.   aspirin EC 81 MG tablet Take 1 tablet (81 mg total) by mouth daily.   ELIQUIS 5 MG Tabs tablet Generic drug:  apixaban Take 5 mg by mouth 2 (two) times daily.   losartan 50 MG tablet Commonly known as:  COZAAR Take 1 tablet (50 mg total) by mouth daily.   metFORMIN 1000 MG tablet Commonly known as:  GLUCOPHAGE Take 1,000 mg by mouth 2 (two) times daily.   metoprolol tartrate 25 MG  tablet Commonly known as:  LOPRESSOR Take 1 tablet (25 mg total) by mouth 2 (two) times daily.   oxyCODONE 5 MG immediate release tablet Commonly known as:  Oxy IR/ROXICODONE Take 5 mg by mouth every 4-6 hours PRN severe pain   TOUJEO SOLOSTAR [place] Inject 50 Units into the skin every evening.   TRULICITY 1.5 MG/0.5ML Sopn Generic drug:  Dulaglutide Inject 1.5 mg into the skin every Tuesday.   VITAMIN B 12 PO Take 1 tablet by mouth every other day.       Coumadin:  INR check Yes/No  Problems/Concerns:  Assessment:  Patient is doing very well , only c/o some sternal incis discomfort.   Further instructions provided as he had questions about some meds.  Contact office if concerns or problems develop  Follow up Appointment:  Follow-up Information    [name], MD Follow up on 11/17/2016.   Specialty:  Cardiothoracic Surgery Why:  PA/LAT CXR to be taken (at [facility] which is in the same building as Dr. Sharee Pimple office) on 11/17/2016 at 10:00 am ;Appointment time is at 10:30 am Contact information: [address] [place] [address] [phone]        Nurse Follow up on 10/19/2016.   Why:  Appointment is for chest tube suture removal only. Appointment time  is at 10:30 am Contact information: [address] [employer] [address] [place] [address]       Manson Passey, [place] Follow up on 10/27/2016.   Specialty:  Cardiology Why:  Appointment time is at 11:00 am Contact information: [address] [place] [address] [phone]

## 2016-10-14 ENCOUNTER — Telehealth (HOSPITAL_COMMUNITY): Payer: Self-pay

## 2016-10-14 NOTE — Telephone Encounter (Signed)
Patient insurances are active and benefits verified. Patient has Medicare A/B and AARP medicare supplement. Medicare A/B - no copayment, deductible $183.00/$183.00 has been met, 20% co-insurance, no pre-authorization and no limit. Passport/reference [phone]. AARP medicare supplement - no co-payment, no deductible, no out of pocket, no co-insurance, no pre-authorization and no limit. Passport/reference [phone].  Patient will be contacted and scheduled after their follow up appointment with the cardiologist on 10/27/16 and surgeon on 11/17/16, upon review by [facility] RN navigator.

## 2016-10-19 ENCOUNTER — Telehealth: Payer: Self-pay | Admitting: Cardiovascular Disease

## 2016-10-19 ENCOUNTER — Other Ambulatory Visit (HOSPITAL_COMMUNITY): Payer: BLUE CROSS/BLUE SHIELD

## 2016-10-19 ENCOUNTER — Ambulatory Visit (INDEPENDENT_AMBULATORY_CARE_PROVIDER_SITE_OTHER): Payer: Self-pay

## 2016-10-19 DIAGNOSIS — G8918 Other acute postprocedural pain: Secondary | ICD-10-CM

## 2016-10-19 DIAGNOSIS — I10 Essential (primary) hypertension: Secondary | ICD-10-CM

## 2016-10-19 DIAGNOSIS — Z4802 Encounter for removal of sutures: Secondary | ICD-10-CM

## 2016-10-19 MED ORDER — METOPROLOL TARTRATE 25 MG PO TABS: 25.0000 mg | ORAL_TABLET | Freq: Two times a day (BID) | ORAL | 1 refills | Status: DC

## 2016-10-19 MED ORDER — OXYCODONE HCL 5 MG PO TABS: ORAL_TABLET | ORAL | 0 refills | Status: DC

## 2016-10-19 NOTE — Telephone Encounter (Signed)
I spoke with the pt and yesterday and today the pt has had neck pain and headache. The pt did get a refill on pain medication today from [facility] and he said his pain is about 50% better.  The pt has also been using icy hot and ice packs to the area with some improvement.  I advised him that he can continue with these methods of treatment at this time. If symptoms worsen he should contact PCP.   The pt also picked up Eliquis Rx and this medication is very expensive.  The pt would like to know if he can be switched to Xarelto.  The pt does have a one month supply of Eliquis at this time. The pt was on this medication prior to heart valve surgery.

## 2016-10-19 NOTE — Progress Notes (Signed)
Removed 2 sutures from chest tube sites, no sign of infection and patient tolerated well. RX for metoprolol sent to pharm and RX refill for oxycodone given by Dr Dorris Fetch

## 2016-10-19 NOTE — Telephone Encounter (Signed)
New Message  Pt daughter Call requesting to speak with RN. She states pt was told after leaving the hospital if he continues to have severe headaches and neck pain to call and get a sooner appt. Please call back to discuss

## 2016-10-20 NOTE — Discharge Summary (Signed)
[address]       [place] [address]             [phone]    Patient ID: Vernon Ruiz MRN: [id number] DOB/AGE: Oct 15, 1950 66 y.o.  Admit date: 10/04/2016 Discharge date: 10/09/2016  Admission Diagnoses: Severe aortic stenosis  Discharge Diagnoses:  1. Persistent atrial fibrillation (HCC) 2. Diabetes mellitus without complication (HCC) 3. Hypertension 4. Obstructive sleep apnea-noncompliant with CPAP 5. Tobacco abuse 6. ABL anemia  Procedure (s):  1. Median Sternotomy 2. Extracorporeal circulation 3.   Aortic valve replacement using a 23mm Edwards Magna-Ease pericardial valve. 4.  Clipping of left atrial appendage by Dr. Laneta Simmers on 10/04/2016.  History of Presenting Illness: The patient is a 66 year old gentleman with type 2 IDDM, hypertension, OSA, PAF on Eliquis and known aortic stenosis. He had presented with some shortness of breath and chest pain in 2015 and was noted to be in atrial fib with RVR. Echo at that time showed moderate AS with a mean gradient of 28 mm Hg. He says that since then he has had no palpitations and if he is having atrial fib he never knows it. He was seen by Dr. Johney Frame in 01/2016 for a second opinion about his atrial fib. His ECG' s had mostly shown sinus rhythm and no further treatment recommended. His echo in 01/2016 showed severe AS with a mean gradient of 48 mm Hg and a peak of 91 mm Hg. His LVEF was 60-65%. Surgery was recommended and he underwent cath on 03/10/2016 showing diffuse non-obstructive CAD with moderate AS with a mean AV gradient of 28.7 mm Hg, a peak of 29 mm Hg and an AVA of 1.2 cm2. His symptoms were only chest pressure with heavy exertion and he decided to hold off on surgery for a while. He had a follow up echo on 09/13/2016 which showed a mean gradient of 38 mm Hg with a peak of 66 mm Hg. The DI was 0.25 and indexed valve area 0.34 cm2/m2. He reports that his symptoms have not changed. He has no shortness of  breath, dizziness, syncope, or fatigue. He only gets some substernal chest pressure with heavy exertion, relieved with rest.   This 66 year old gentleman has stage D, severe, symptomatic aortic stenosis with his only symptom being substernal chest pressure with heavy exertion, NYHA class 1. Dr. Laneta Simmers personally reviewed his serial echos and cath images. His echos show progressive AS with a mean gradient of 48 mm Hg in 01/2016. His most recent echo this week shows a mean gradient of 38 mm Hg but his aortic valve is trileaflet, severely calcified and poorly mobile consistent with severe AS. Dr. Laneta Simmers thought AVR is indicated in this patient who will soon become more symptomatic. He did not think he is a TAVR candidate given his low risk for open surgical AVR. He does have a history of PAF but all of the ECG's that Dr. Laneta Simmers saw in [facility] show sinus rhythm and he denies any palpitations. He didn't think he would perform a MAZE in this patient given this history and his obesity which makes exposure for performing a MAZE more difficult and probably less complete. He discussed the choices for valve replacement prosthesis including mechanical and bioprosthetic valves. Given his age of 66 He thought a bioprosthetic valve would be best so that he does not have to go on Coumadin for the rest of his life and could be maintained  on Eliquis for his atrial fib history as long as he tolerates it. Carotid duplex US showed no significant internal carotid artery stenosis bilaterally. Patient was admitted on 10/04/2016 for an AVR and clipping of LA appendage.  Brief Hospital Course:  The patient was extubated the evening of surgery without difficulty. He remained afebrile and hemodynamically stable. Theone Murdoch, a line, chest tubes, and foley were removed early in the post operative course. Lopressor was started and titrated accordingly. He was volume over loaded and diuresed. He had ABL anemia. He did not require a post op  transfusion. Last H and H was 10.6 and 32.9. He also had thrombocytopenia. He was weaned off the insulin drip.  Once he was tolerating a diet, Metformin was restarted.  The patient's glucose remained well controlled.The patient's HGA1C pre op was 9.5 . He will need close follow up with medical doctor after discharge. The patient was felt surgically stable for transfer from the ICU to PCTU for further convalescence on 10/07/2016. He continues to progress with cardiac rehab. He was ambulating on room air. He has been tolerating a diet and has had a bowel movement. He was restarted on low dose Losartan. Based on his blood pressure, we will resume home dose of 50 mg of Losartan at dischargeEpicardial pacing wires were removed on 10/08/2016. Chest tube sutures will be removed in the office after discharge. The patient is felt surgically stable for discharge today.   Latest Vital Signs: Blood pressure 137/65, pulse 77, temperature 98.3 F (36.8 C), temperature source Oral, resp. rate 18, height 5\' 5"  (1.651 m), weight 94.4 kg (208 lb 3.2 oz), SpO2 95 %.  Physicial Exam:  General appearance: alert and cooperative Neurologic: intact Heart: regular rate and rhythm, S1, S2 normal, no murmur, click, rub or gallop Lungs: clear to auscultation bilaterally Extremities: extremities normal, atraumatic, no cyanosis or edema Wound: incisions looks good.  Discharge Condition:Stable and discharged to home.  Recent laboratory studies:  Lab Results  Component Value Date   WBC 10.0 10/08/2016   HGB 10.6 (L) 10/08/2016   HCT 32.9 (L) 10/08/2016   MCV 80.4 10/08/2016   PLT 201 10/08/2016   Lab Results  Component Value Date   NA 136 10/08/2016   K 3.7 10/08/2016   CL 102 10/08/2016   CO2 27 10/08/2016   CREATININE 0.92 10/08/2016   GLUCOSE 134 (H) 10/08/2016    Diagnostic Studies: Dg Chest 2 View  Result Date: 10/08/2016 CLINICAL DATA:  Status post aortic valve replacement EXAM: CHEST  2 VIEW COMPARISON:   10/06/2016 FINDINGS: Postsurgical changes are again seen and stable. Cardiac shadow remains enlarged. Bibasilar atelectatic changes are again seen with small pleural effusions. Right jugular sheath has been removed. No pneumothorax is noted. No bony abnormality is seen. Resolution of previously seen vascular congestion is noted. IMPRESSION: Postsurgical changes with bibasilar atelectatic changes. Electronically Signed   By: Alcide Clever M.D.   On: 10/08/2016 07:27    Discharge Instructions    Amb Referral to Cardiac Rehabilitation    Complete by:  As directed    Diagnosis:  Valve Replacement   Valve:  Aortic   Discharge patient    Complete by:  As directed    Discharge disposition:  01-Home or Self Care   Discharge patient date:  10/09/2016     Discharge Medications: Allergies as of 10/09/2016      Reactions   Penicillins Itching, Rash   All over body Has patient had a PCN reaction causing  immediate rash, facial/tongue/throat swelling, SOB or lightheadedness with hypotension: yes Has patient had a PCN reaction causing severe rash involving mucus membranes or skin necrosis: no Has patient had a PCN reaction that required hospitalization no Has patient had a PCN reaction occurring within the last 10 years: no If all of the above answers are "NO", then may proceed with Cephalosporin use.   Sulfa Antibiotics Rash   Rash   Sulfamethoxazole Rash      Medication List    STOP taking these medications   CARTIA XT 120 MG 24 hr capsule Generic drug:  diltiazem     TAKE these medications   acetaminophen 325 MG tablet Commonly known as:  TYLENOL Take 2 tablets (650 mg total) by mouth every 6 (six) hours as needed for mild pain.   aspirin EC 81 MG tablet Take 1 tablet (81 mg total) by mouth daily.   ELIQUIS 5 MG Tabs tablet Generic drug:  apixaban Take 5 mg by mouth 2 (two) times daily.   losartan 50 MG tablet Commonly known as:  COZAAR Take 1 tablet (50 mg total) by mouth  daily. What changed:  medication strength   metFORMIN 1000 MG tablet Commonly known as:  GLUCOPHAGE Take 1,000 mg by mouth 2 (two) times daily.   metoprolol tartrate 25 MG tablet Commonly known as:  LOPRESSOR Take 1 tablet (25 mg total) by mouth 2 (two) times daily.   oxyCODONE 5 MG immediate release tablet Commonly known as:  Oxy IR/ROXICODONE Take 5 mg by mouth every 4-6 hours PRN severe pain   TOUJEO SOLOSTAR [place] Inject 50 Units into the skin every evening.   TRULICITY 1.5 MG/0.5ML Sopn Generic drug:  Dulaglutide Inject 1.5 mg into the skin every Tuesday.   VITAMIN B 12 PO Take 1 tablet by mouth every other day.      The patient has been discharged on:   1.Beta Blocker:  Yes [  x ]                              No   [   ]                              If No, reason:  2.Ace Inhibitor/ARB: Yes [ x  ]                                     No  [    ]                                     If No, reason:  3.Statin:   Yes [   ]                  No  [x   ]                  If No, reason: No CAD  4.Marlowe Kays:  Yes  [ x  ]                  No   [   ]                  If No, reason:  Follow Up Appointments: Follow-up Information    [name], MD Follow up on 11/17/2016.   Specialty:  Cardiothoracic Surgery Why:  PA/LAT CXR to be taken (at [facility] which is in the same building as Dr. Sharee Pimple office) on 11/17/2016 at 10:00 am ;Appointment time is at 10:30 am Contact information: [address] [place] [address] [phone]        Nurse Follow up on 10/19/2016.   Why:  Appointment is for chest tube suture removal only. Appointment time is at 10:30 am Contact information: [address] [employer] [address] [place] [address]       Manson Passey, [place] Follow up on 10/27/2016.   Specialty:  Cardiology Why:  Appointment time is at 11:00 am Contact information: [address] [place] [address] [place] [address] [phone]            Signed: Orson Ape 10/09/2016, 7:37 AM

## 2016-10-20 NOTE — Telephone Encounter (Signed)
Would be fine to switch to Xarelto 20 mg daily once he runs out of Eliquis

## 2016-10-22 NOTE — Telephone Encounter (Signed)
I spoke with the Vernon Ruiz and made him aware that Dr Excell Seltzer is okay with transitioning him to Xarelto when he finishes his current supply of Eliquis. The Vernon Ruiz has an appointment with our office next week and he would like to discuss this medication change and get a prescription at that time.

## 2016-10-26 ENCOUNTER — Encounter: Payer: Self-pay | Admitting: Physician Assistant

## 2016-10-26 NOTE — Progress Notes (Signed)
Cardiology Office Note    Date:  10/27/2016   ID:  Vernon Ruiz, DOB [birth year], MRN [id number]  PCP:  Rinaldo Cloud, MD  Cardiologist:  Dr. Excell Seltzer  Chief Complaint: Hospital follow up s/p  AVR  History of Present Illness:   Vernon Ruiz is a 66 y.o. male with hx of PAF, aortic stenosis s/p AVR, HTN, DM and OSA presents for follow up.   Hx of AS dating back to 2015 that managed conservatively. The patient underwent cardiac catheterization in November 2017 demonstrating diffuse nonobstructive coronary artery disease. He has had fairly mild symptoms and has wished to defer on surgical aortic valve replacement.   Last seen by Dr. Excell Seltzer 07/2016. Doing well. Echo 09/2016 showed LVEF of 55-60% with severe AS. Evaluated by Dr. Laneta Simmers and felt he is a not TAVR candidate given his low risk for open surgical AVR. Patient underwent AVR and clipping of LA appendage on 10/04/16. No post op complications. Discharged on 10/02/16 stably.   Here today for follow up. He is walking 300 yards at a time without chest pain or dyspnea. Denies orthopnea, PND, syncope, dizziness, palpitation, lower extremity edema or melena. Compliant with medication. The patient is in donut hole and like to switch Eliquis to Xarelto. Not sleeping flat.   Past Medical History:  Diagnosis Date  . Aortic stenosis    s/p AVR using a 23mm Edwards Magna-Ease pericardial valve 10/04/16  . Coronary artery disease   . Diabetes mellitus without complication (HCC)   . GERD (gastroesophageal reflux disease)   . Heart murmur   . Hypertension   . Obstructive sleep apnea    positive from a home test, still needs to get another study done  . Persistent atrial fibrillation (HCC)    s/p clipping of LA appendage 10/04/16    Past Surgical History:  Procedure Laterality Date  . AORTIC VALVE REPLACEMENT N/A 10/04/2016   Procedure: AORTIC VALVE REPLACEMENT (AVR);  Surgeon: [name], MD;  Location: [facility] OR;  Service: Open Heart Surgery;   Laterality: N/A;  . APPENDECTOMY    . CARDIAC CATHETERIZATION N/A 03/10/2016   Procedure: Right/Left Heart Cath and Coronary Angiography;  Surgeon: [name], MD;  Location[facility] INVASIVE CV LAB;  Service: Cardiovascular;  Laterality: N/A;  . CLIPPING OF ATRIAL APPENDAGE N/A 10/04/2016   Procedure: CLIPPING OF ATRIAL APPENDAGE;  Surgeon: [name], MD;  Location: [facility] OR;  Service: Open Heart Surgery;  Laterality: N/A;  . COLONOSCOPY    . EYE SURGERY Right    growth removed from eye  . HERNIA REPAIR Bilateral    inguinal  . TEE WITHOUT CARDIOVERSION N/A 10/04/2016   Procedure: TRANSESOPHAGEAL ECHOCARDIOGRAM (TEE);  Surgeon: [name], MD;  Location: [facility] OR;  Service: Open Heart Surgery;  Laterality: N/A;    Current Medications: Prior to Admission medications   Medication Sig Start Date End Date Taking? Authorizing Provider  acetaminophen (TYLENOL) 325 MG tablet Take 2 tablets (650 mg total) by mouth every 6 (six) hours as needed for mild pain. 10/08/16   Ardelle Balls, PA-C  apixaban (ELIQUIS) 5 MG TABS tablet Take 5 mg by mouth 2 (two) times daily.    [provider]  aspirin EC 81 MG tablet Take 1 tablet (81 mg total) by mouth daily. 10/09/16   Ardelle Balls, PA-C  Cyanocobalamin (VITAMIN B 12 PO) Take 1 tablet by mouth every other day.     [provider]  Insulin Glargine (TOUJEO  SOLOSTAR [place]) Inject 50 Units into the skin every evening.     [provider]  losartan (COZAAR) 50 MG tablet Take 1 tablet (50 mg total) by mouth daily. 10/08/16   Ardelle Balls, PA-C  metFORMIN (GLUCOPHAGE) 1000 MG tablet Take 1,000 mg by mouth 2 (two) times daily.  06/14/16   [provider]  metoprolol tartrate (LOPRESSOR) 25 MG tablet Take 1 tablet (25 mg total) by mouth 2 (two) times daily. 10/19/16   [name], MD  oxyCODONE (OXY IR/ROXICODONE) 5 MG immediate release tablet Take 5 mg by mouth every 4-6 hours PRN severe pain 10/19/16    Loreli Slot, MD  TRULICITY 1.5 MG/0.5ML SOPN Inject 1.5 mg into the skin every Tuesday.  05/13/16   [provider]    Allergies:   Penicillins; Sulfa antibiotics; and Sulfamethoxazole   Social History   Social History  . Marital status: Married    Spouse name: N/A  . Number of children: N/A  . Years of education: N/A   Social History Main Topics  . Smoking status: Former Smoker    Types: Cigars    Quit date: 09/16/2016  . Smokeless tobacco: Never Used  . Alcohol use Yes     Comment: very rare  . Drug use: No  . Sexual activity: Not Asked   Other Topics Concern  . None   Social History Narrative   Retired   Owns hotels        Family History:  The patient's family history is not on file.   ROS:   Please see the history of present illness.    ROS All other systems reviewed and are negative.   PHYSICAL EXAM:   VS:  BP 124/60   Pulse (!) 58   Ht 5\' 5"  (1.651 m)   Wt 205 lb 12.8 oz (93.4 kg)   BMI 34.25 kg/m    GEN: Well nourished, well developed, in no acute distress  HEENT: normal  Neck: no JVD, carotid bruits, or masses Cardiac: RRR; no murmurs, rubs, or gallops,no edema, surgical scar without erythema or edema Respiratory:  clear to auscultation bilaterally, normal work of breathing GI: soft, nontender, nondistended, + BS MS: no deformity or atrophy  Skin: warm and dry, no rash Neuro:  Alert and Oriented x 3, Strength and sensation are intact Psych: euthymic mood, full affect  Wt Readings from Last 3 Encounters:  10/27/16 205 lb 12.8 oz (93.4 kg)  10/09/16 208 lb 3.2 oz (94.4 kg)  09/30/16 215 lb 8 oz (97.8 kg)      Studies/Labs Reviewed:   EKG:  EKG is ordered today.  The ekg ordered today demonstrates SR at rate of 58 bpm  Recent Labs: 09/30/2016: ALT 23 10/05/2016: Magnesium 2.1 10/08/2016: BUN 19; Creatinine, Ser 0.92; Hemoglobin 10.6; Platelets 201; Potassium 3.7; Sodium 136   Lipid Panel No results found for: CHOL, TRIG, HDL,  CHOLHDL, VLDL, LDLCALC, LDLDIRECT  Additional studies/ records that were reviewed today include:   TEE 10/04/16 Result status: Final result   Left ventricle: Normal cavity size, wall thickness and left ventricular diastolic function. LV systolic function is normal with an EF of 55-60%. There are no obvious wall motion abnormalities. No thrombus present. No mass present.  Aortic valve: The valve is trileaflet. Severe valve thickening present. Severe valve calcification present. Severely decreased leaflet separation. Severe stenosis. Trace regurgitation. No AV vegetation.  Mitral valve: Mild leaflet thickening is present. Mild leaflet calcification is present. Mild mitral  annular calcification. Mild regurgitation.  Right ventricle: Normal cavity size, wall thickness and ejection fraction.  Pulmonic valve: Trace regurgitation.     TTE 09/13/16 Study Conclusions  - Left ventricle: The cavity size was mildly dilated. Wall   thickness was increased in a pattern of mild LVH. Systolic   function was normal. The estimated ejection fraction was in the   range of 55% to 60%. - Aortic valve: Pevious gardients in October 2017 noted higher   gradients mean 48 mmHg and peak of 91 mmHg. Calculared AVA still   severe consider right and left cath and dedicated pedof doppler   exam. There was severe stenosis. There was mild regurgitation.   Valve area (VTI): 0.8 cm^2. Valve area (Vmax): 0.77 cm^2. Valve   area (Vmean): 0.73 cm^2. - Left atrium: The atrium was moderately dilated. - Pulmonary arteries: PA peak pressure: 38 mm Hg (S).  Right/Left Heart Cath and Coronary Angiography 02/2016  Conclusion   1. Diffuse nonobstructive CAD 2. Moderately severe aortic stenosis 3. Normal right heart hemodynamics/normal LVEDP  Pt with echo criteria for severe aortic stenosis (mean gradient 42 mmHg, peak velocity 4 m/s) and borderline cath criteria. Reasonable to consider aortic valve replacement as he does  have angina at a high workload with no other explanation for symptoms. He wishes to wait several months for surgical referral because of insurance reasons. He is counseled about keeping an eye out for progressive symptoms.      ASSESSMENT & PLAN:    1. S/p AVR - Surgical scar healing well. No dyspnea or chest pain. Walking  without any symptoms.  2. PAF s/p LA Clipping - Maintaining sinus rhythm. Will switch Eliquis to Xarelto when he his finish current supply (due to insurance donut hole). No bleeding issue.   3. HTN - STable and well controlled on current medications.     Medication Adjustments/Labs and Tests Ordered: Current medicines are reviewed at length with the patient today.  Concerns regarding medicines are outlined above.  Medication changes, Labs and Tests ordered today are listed in the Patient Instructions below. Patient Instructions  Medication Instructions:    STOP TAKING ELIQUIS 5 MG TWICE A DAY   ( FINISH ELIQUIS DOSE TAKE ON TABLET IN AM THEN 1 HOUR BEFORE EVENING DOSE TAKE XARELTO)  START TAKING XARELTO 20 MG ONCE A DAY    If you need a refill on your cardiac medications before your next appointment, please call your pharmacy.  Labwork: NONE ORDERED  TODAY   Testing/Procedures: NONE ORDERED  TODAY    Follow-Up:  WITH COPPER IN 4 MONTHS    Any Other Special Instructions Will Be Listed Below (If Applicable).  Lorelei Pont, [place]  10/27/2016 12:16 PM    [facility] [address] [place], [place], [place]  [address] Phone: [phone]; Fax: [phone]

## 2016-10-27 ENCOUNTER — Encounter: Payer: Self-pay | Admitting: Physician Assistant

## 2016-10-27 ENCOUNTER — Encounter: Payer: BLUE CROSS/BLUE SHIELD | Admitting: Surgery

## 2016-10-27 ENCOUNTER — Ambulatory Visit (INDEPENDENT_AMBULATORY_CARE_PROVIDER_SITE_OTHER): Payer: Medicare Other | Admitting: Physician Assistant

## 2016-10-27 VITALS — BP 124/60 | HR 58 | Ht 65.0 in | Wt 205.8 lb

## 2016-10-27 DIAGNOSIS — I480 Paroxysmal atrial fibrillation: Secondary | ICD-10-CM | POA: Diagnosis not present

## 2016-10-27 DIAGNOSIS — Z952 Presence of prosthetic heart valve: Secondary | ICD-10-CM

## 2016-10-27 DIAGNOSIS — I2510 Atherosclerotic heart disease of native coronary artery without angina pectoris: Secondary | ICD-10-CM | POA: Diagnosis not present

## 2016-10-27 DIAGNOSIS — I10 Essential (primary) hypertension: Secondary | ICD-10-CM | POA: Diagnosis not present

## 2016-10-27 MED ORDER — RIVAROXABAN 20 MG PO TABS: 20.0000 mg | ORAL_TABLET | Freq: Every day | ORAL | 5 refills | Status: DC

## 2016-10-27 NOTE — Patient Instructions (Addendum)
Medication Instructions:    STOP TAKING ELIQUIS 5 MG TWICE A DAY   ( FINISH ELIQUIS DOSE TAKE ON TABLET IN AM THEN 1 HOUR BEFORE EVENING DOSE TAKE XARELTO)  START TAKING XARELTO 20 MG ONCE A DAY    If you need a refill on your cardiac medications before your next appointment, please call your pharmacy.  Labwork: NONE ORDERED  TODAY   Testing/Procedures: NONE ORDERED  TODAY    Follow-Up:  WITH COPPER IN 4 MONTHS    Any Other Special Instructions Will Be Listed Below (If Applicable).

## 2016-10-29 ENCOUNTER — Telehealth (HOSPITAL_COMMUNITY): Payer: Self-pay

## 2016-10-29 NOTE — Telephone Encounter (Signed)
I called patient to discuss scheduling for cardiac rehab. Patient stated that he wants to speak to his doctor before scheduling. Patient has a follow up appointment with his surgeon on 11/17/16. Patient stated he will call back after talking with provider.

## 2016-11-05 DIAGNOSIS — E119 Type 2 diabetes mellitus without complications: Secondary | ICD-10-CM | POA: Diagnosis not present

## 2016-11-05 DIAGNOSIS — I350 Nonrheumatic aortic (valve) stenosis: Secondary | ICD-10-CM | POA: Diagnosis not present

## 2016-11-05 DIAGNOSIS — E785 Hyperlipidemia, unspecified: Secondary | ICD-10-CM | POA: Diagnosis not present

## 2016-11-05 DIAGNOSIS — Z952 Presence of prosthetic heart valve: Secondary | ICD-10-CM | POA: Diagnosis not present

## 2016-11-05 DIAGNOSIS — E6609 Other obesity due to excess calories: Secondary | ICD-10-CM | POA: Diagnosis not present

## 2016-11-05 DIAGNOSIS — Z6834 Body mass index (BMI) 34.0-34.9, adult: Secondary | ICD-10-CM | POA: Diagnosis not present

## 2016-11-05 DIAGNOSIS — I4891 Unspecified atrial fibrillation: Secondary | ICD-10-CM | POA: Diagnosis not present

## 2016-11-05 DIAGNOSIS — I2510 Atherosclerotic heart disease of native coronary artery without angina pectoris: Secondary | ICD-10-CM | POA: Diagnosis not present

## 2016-11-05 DIAGNOSIS — I10 Essential (primary) hypertension: Secondary | ICD-10-CM | POA: Diagnosis not present

## 2016-11-10 ENCOUNTER — Other Ambulatory Visit: Payer: Self-pay | Admitting: *Deleted

## 2016-11-10 ENCOUNTER — Ambulatory Visit (INDEPENDENT_AMBULATORY_CARE_PROVIDER_SITE_OTHER): Payer: Self-pay | Admitting: *Deleted

## 2016-11-10 DIAGNOSIS — Z4889 Encounter for other specified surgical aftercare: Secondary | ICD-10-CM

## 2016-11-10 DIAGNOSIS — Z952 Presence of prosthetic heart valve: Secondary | ICD-10-CM

## 2016-11-10 DIAGNOSIS — I350 Nonrheumatic aortic (valve) stenosis: Secondary | ICD-10-CM

## 2016-11-11 ENCOUNTER — Encounter: Payer: Self-pay | Admitting: *Deleted

## 2016-11-11 NOTE — Progress Notes (Signed)
Mr. Vernon Ruiz is s/p AVR by Dr. Laneta Simmers on 10/04/16. He has seen cardiology since discharge, but not Dr. Laneta Simmers. He has a f/u apt. on 11/17/16. One previous apt had to be cancelled and rescheduled. He called this morning with complaints of some sternal discomfort and felt like he had swelling of the chest at or near the operative site. I asked Dr. Tyrone Sage to examine him and reassure him that everything was fine, as I could not detect any issues. The sternal incision is very well healed. He says that the pain is lessened today and the swelling of both sides of the incision does not seem to be as noticeable as before. He is taking Naproxyn Sodium. Dr. Tyrone Sage asked him to just use Tylenol to protect his kidneys. He will return as scheduled.

## 2016-11-16 ENCOUNTER — Other Ambulatory Visit: Payer: Self-pay | Admitting: Surgery

## 2016-11-16 DIAGNOSIS — Z952 Presence of prosthetic heart valve: Secondary | ICD-10-CM

## 2016-11-17 ENCOUNTER — Ambulatory Visit
Admission: RE | Admit: 2016-11-17 | Discharge: 2016-11-17 | Disposition: A | Discharge status: Home / Self Care | Payer: Medicare Other | Source: Ambulatory Visit | Attending: Surgery | Admitting: Surgery

## 2016-11-17 ENCOUNTER — Encounter: Payer: Self-pay | Admitting: Surgery

## 2016-11-17 ENCOUNTER — Ambulatory Visit (INDEPENDENT_AMBULATORY_CARE_PROVIDER_SITE_OTHER): Payer: Self-pay | Admitting: Surgery

## 2016-11-17 VITALS — BP 124/76 | HR 66 | Resp 16 | Ht 65.0 in | Wt 214.0 lb

## 2016-11-17 DIAGNOSIS — I350 Nonrheumatic aortic (valve) stenosis: Secondary | ICD-10-CM

## 2016-11-17 DIAGNOSIS — Z952 Presence of prosthetic heart valve: Secondary | ICD-10-CM

## 2016-11-17 DIAGNOSIS — J90 Pleural effusion, not elsewhere classified: Secondary | ICD-10-CM | POA: Diagnosis not present

## 2016-11-17 IMAGING — CR DG CHEST 2V
2 series · 2 of 2 positions shown · non-contrast
Comparison: [DATE]

CLINICAL DATA: AVR [DATE], CAD, HTN, DM, there are no other chest
complaints (patient going to [REDACTED] now)

EXAM:
CHEST - 2 VIEW

[w chest pa]
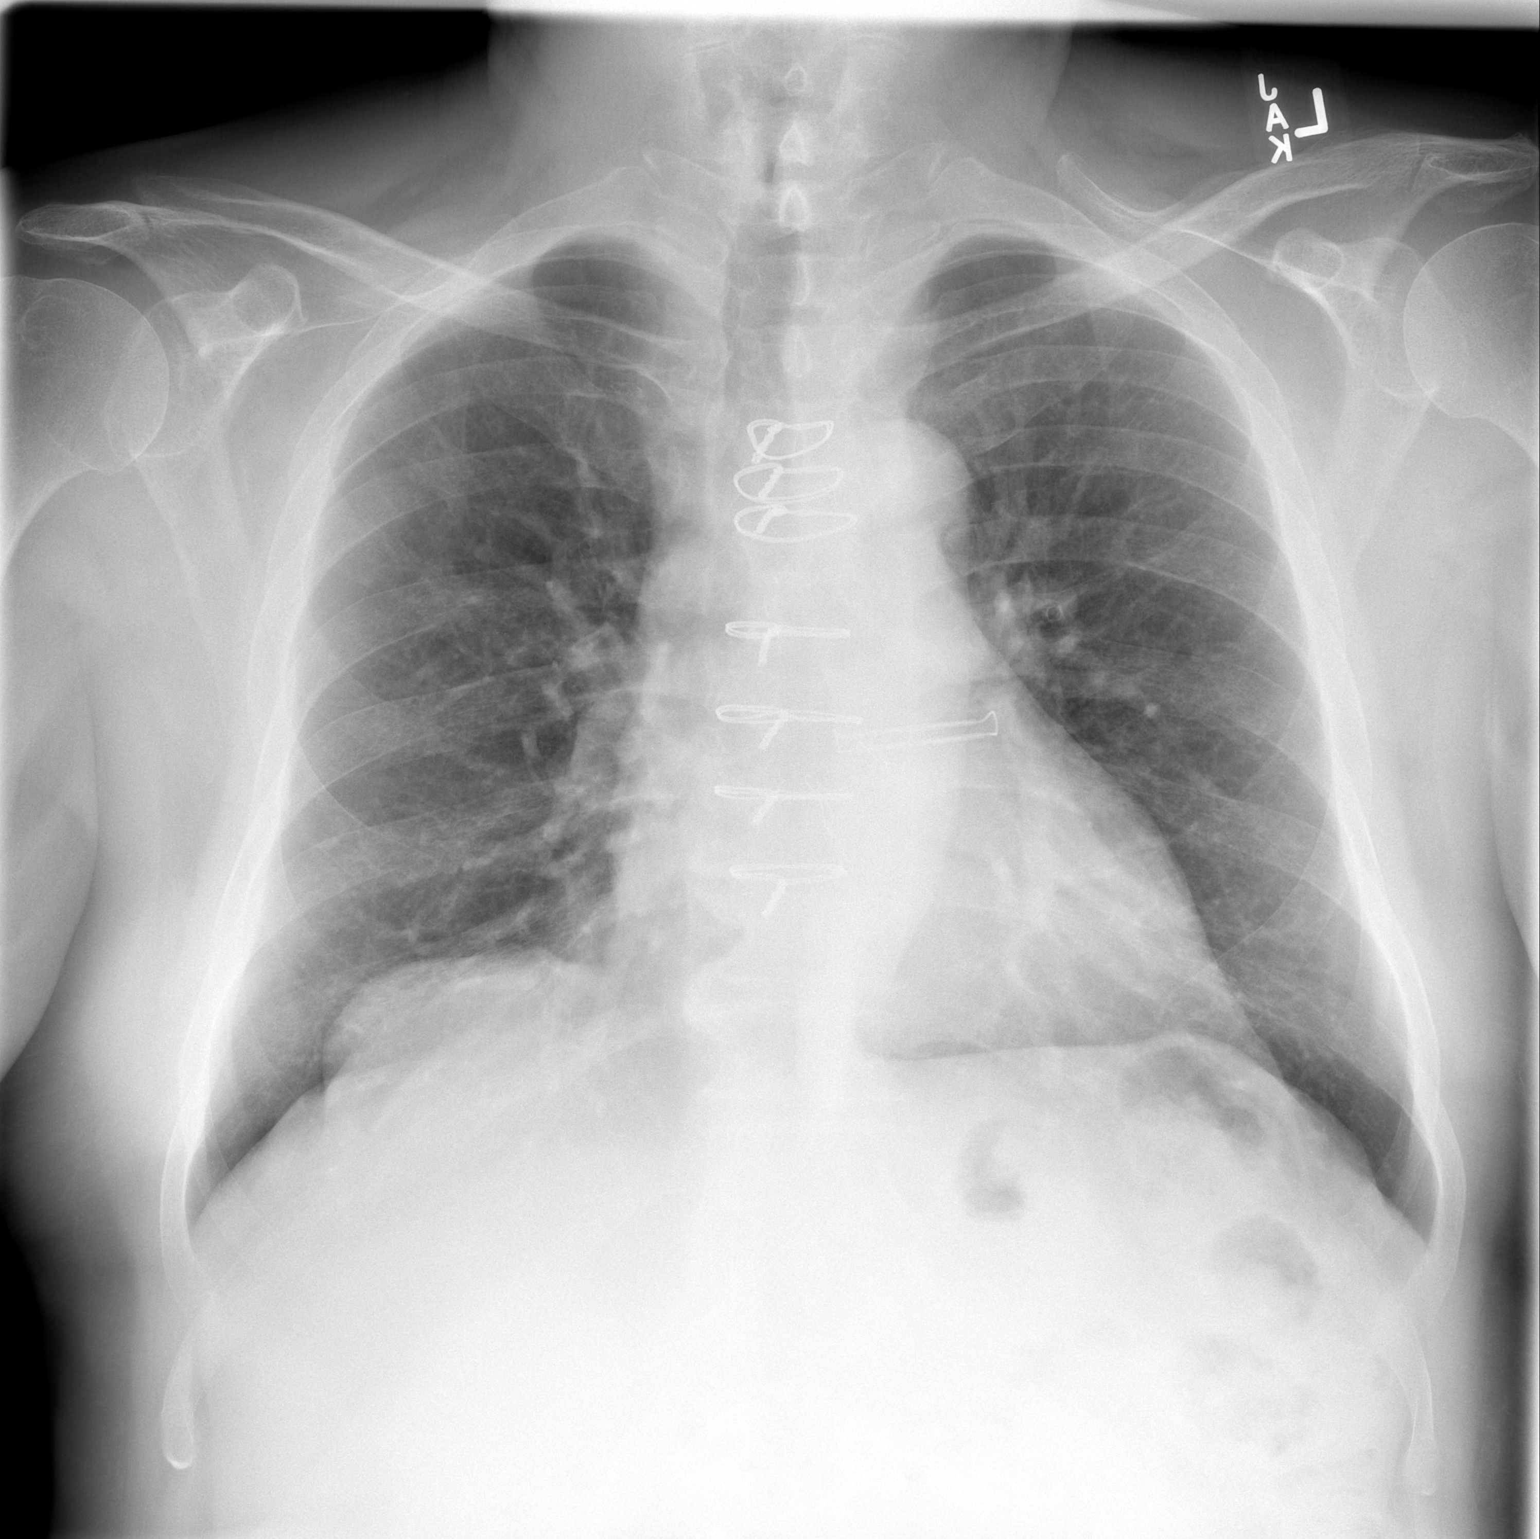

[w chest lat]
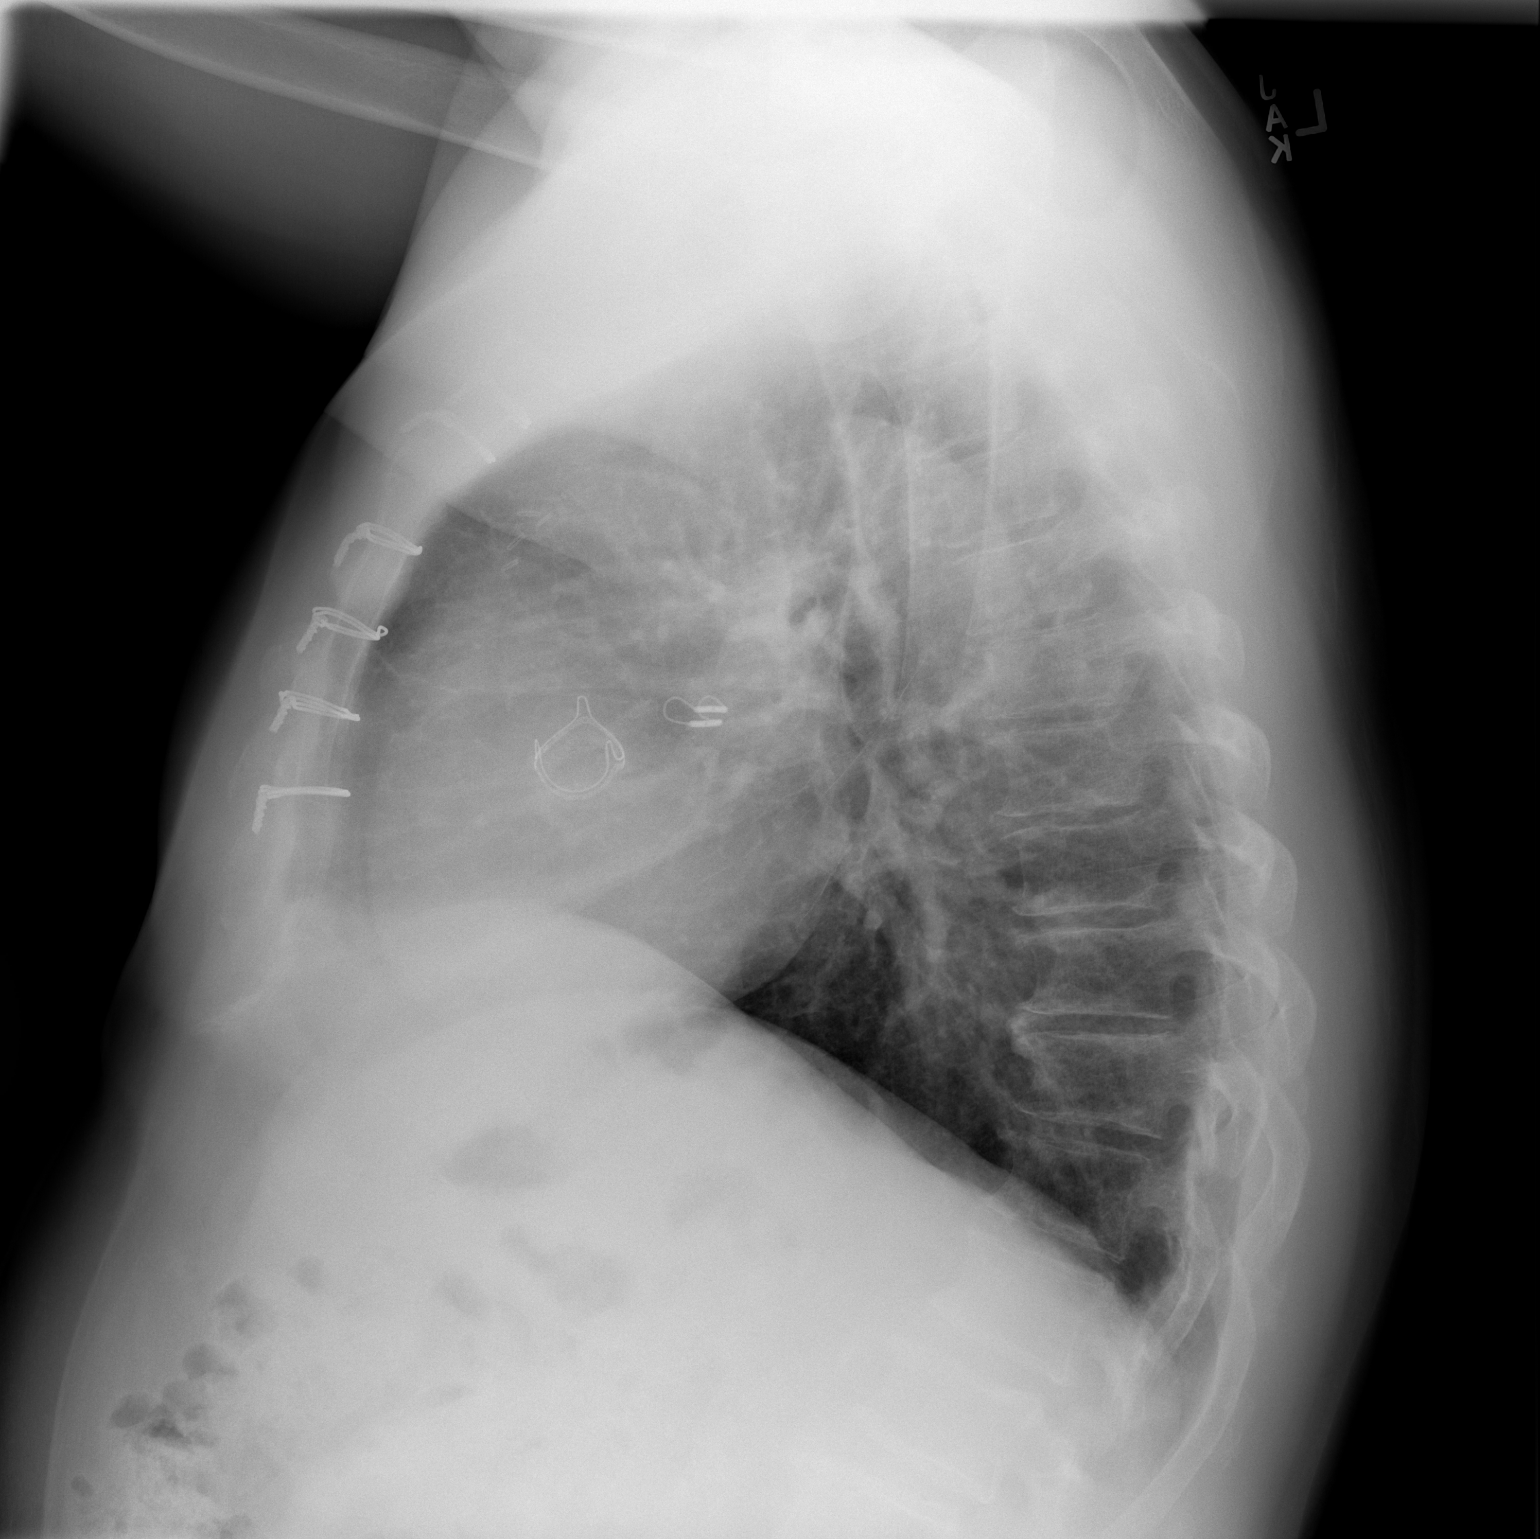

[2 of 2 positions shown; findings below may reference images not displayed]

FINDINGS: Improved aeration.  Lungs are clear.

Heart size and mediastinal contours are within normal limits.
Previous AVR and atrial appendage clip.

No effusion.  No pneumothorax.

Stable sternotomy wires. Anterior vertebral endplate spurring at
multiple levels in the lower thoracic spine.
IMPRESSION: Stable postop changes. Improved aeration since previous exam, with
resolution of pleural effusions.

## 2016-11-18 ENCOUNTER — Encounter: Payer: Self-pay | Admitting: Surgery

## 2016-11-18 NOTE — Progress Notes (Signed)
HPI: Patient returns for routine postoperative follow-up having undergone AVR using a 23 mm Edwards Magna-Ease pericardial valve and clipping of the left atrial appendage on 10/04/2016. He has a history of paroxysmal atrial fib with rare episodes.  The patient's early postoperative recovery while in the hospital was notable for an uncomplicated postop course.  Since hospital discharge the patient reports that he has been feeling well. He is walking at least 1.5 miles per day with not chest pain or shortness of breath. He has continued to abstain from smoking cigars. He has returned to see the cardiology PA and his Eliquis was switched to Xarelto due to the high cost of Eliquis for him. Unfortunately he says the Xarelto costs about the same. He does not want to take Coumadin.   Current Outpatient Prescriptions  Medication Sig Dispense Refill  . acetaminophen (TYLENOL) 325 MG tablet Take 2 tablets (650 mg total) by mouth every 6 (six) hours as needed for mild pain.    Marland Kitchen apixaban (ELIQUIS) 5 MG TABS tablet Take 5 mg by mouth 2 (two) times daily.    Marland Kitchen aspirin EC 81 MG tablet Take 1 tablet (81 mg total) by mouth daily.    Marland Kitchen BYDUREON 2 MG PEN Inject 2 mg into the skin once a week.    . Cyanocobalamin (VITAMIN B 12 PO) Take 1 tablet by mouth every other day.     . Insulin Glargine (TOUJEO SOLOSTAR [place]) Inject 50 Units into the skin every evening.     Marland Kitchen losartan (COZAAR) 50 MG tablet Take 1 tablet (50 mg total) by mouth daily.    . metFORMIN (GLUCOPHAGE) 1000 MG tablet Take 1,000 mg by mouth 2 (two) times daily.   1  . metoprolol tartrate (LOPRESSOR) 25 MG tablet Take 1 tablet (25 mg total) by mouth 2 (two) times daily. 60 tablet 1   No current facility-administered medications for this visit.     Physical Exam: BP 124/76 (BP Location: Right Arm, Patient Position: Sitting, Cuff Size: Large)   Pulse 66   Resp 16   Ht 5\' 5"  (1.651 m)   Wt 214 lb (97.1 kg)   SpO2 97% Comment: RA  BMI 35.61  kg/m  He looks well. Lung exam is clear. Cardiac exam shows a regular rate and rhythm with normal heart sounds. Chest incision is healing well and sternum is stable. There is no peripheral edema.   Diagnostic Tests:  CLINICAL DATA:  AVR 10/04/16, CAD, HTN, DM, there are no other chest complaints (patient going to tcts now)  EXAM: CHEST - 2 VIEW  COMPARISON:  10/08/2016  FINDINGS: Improved aeration.  Lungs are clear.  Heart size and mediastinal contours are within normal limits. Previous AVR and atrial appendage clip.  No effusion.  No pneumothorax.  Stable sternotomy wires. Anterior vertebral endplate spurring at multiple levels in the lower thoracic spine.  IMPRESSION: Stable postop changes. Improved aeration since previous exam, with resolution of pleural effusions.   Electronically Signed   By: Corlis Leak M.D.   On: 11/17/2016 10:22   Impression:  Overall I think he is doing well. I encouraged him to continue walking and to refrain from smoking cigars.  He is planning to participate in cardiac rehab and I think he is ready to start that.  I told him he could drive his car but should not lift anything heavier than 10 lbs for three months postop.   Plan:  He is going to see  Dr. Excell Seltzer for cardiology follow up in a few months.    [name], MD [facility] [phone]

## 2016-11-29 DIAGNOSIS — E785 Hyperlipidemia, unspecified: Secondary | ICD-10-CM | POA: Diagnosis not present

## 2016-11-29 DIAGNOSIS — I350 Nonrheumatic aortic (valve) stenosis: Secondary | ICD-10-CM | POA: Diagnosis not present

## 2016-11-29 DIAGNOSIS — I10 Essential (primary) hypertension: Secondary | ICD-10-CM | POA: Diagnosis not present

## 2016-11-29 DIAGNOSIS — Z6835 Body mass index (BMI) 35.0-35.9, adult: Secondary | ICD-10-CM | POA: Diagnosis not present

## 2016-11-29 DIAGNOSIS — Z952 Presence of prosthetic heart valve: Secondary | ICD-10-CM | POA: Diagnosis not present

## 2016-11-29 DIAGNOSIS — E6609 Other obesity due to excess calories: Secondary | ICD-10-CM | POA: Diagnosis not present

## 2016-11-29 DIAGNOSIS — I480 Paroxysmal atrial fibrillation: Secondary | ICD-10-CM | POA: Diagnosis not present

## 2016-11-29 DIAGNOSIS — E119 Type 2 diabetes mellitus without complications: Secondary | ICD-10-CM | POA: Diagnosis not present

## 2016-12-01 ENCOUNTER — Telehealth: Payer: Self-pay | Admitting: Cardiovascular Disease

## 2016-12-01 MED ORDER — CLINDAMYCIN HCL 300 MG PO CAPS: ORAL_CAPSULE | ORAL | 0 refills | Status: DC

## 2016-12-01 NOTE — Telephone Encounter (Signed)
New message   Pt is calling to ask if he needs medication before having his teeth cleaned. Please call.

## 2016-12-01 NOTE — Telephone Encounter (Signed)
Spoke with pt and made him aware that he would need abts prior to dental work.  Sent in Clindamycin to pt's preferred pharmacy.  Pt verbalized understanding.

## 2016-12-02 ENCOUNTER — Telehealth (HOSPITAL_COMMUNITY): Payer: Self-pay

## 2016-12-02 NOTE — Telephone Encounter (Signed)
Cardiac Rehab Medication Review by a Pharmacist  Does the patient  feel that his/her medications are working for him/her?  yes  Has the patient been experiencing any side effects to the medications prescribed?  no  Does the patient measure his/her own blood pressure or blood glucose at home?  no - when he occasionally takes his BP he says its around 120/80 and his BG is normally >200  Does the patient have any problems obtaining medications due to transportation or finances?   No - he reports no issues  Understanding of regimen: good Understanding of indications: good Potential of compliance: good    Pharmacist comments: Patients seems to be aware of the importance of adherence. He still states he doesn't take important meds like ASA and he doesn't check his BG as often as he should.  Overall, good understanding of his regimen.  Lina Sar Yuval Rubens 12/02/2016 4:49 PM

## 2016-12-04 ENCOUNTER — Other Ambulatory Visit: Payer: Self-pay | Admitting: Cardiovascular Disease

## 2016-12-04 DIAGNOSIS — I10 Essential (primary) hypertension: Secondary | ICD-10-CM

## 2016-12-06 ENCOUNTER — Telehealth: Payer: Self-pay | Admitting: Cardiovascular Disease

## 2016-12-06 DIAGNOSIS — I10 Essential (primary) hypertension: Secondary | ICD-10-CM

## 2016-12-06 MED ORDER — METOPROLOL TARTRATE 25 MG PO TABS: 25.0000 mg | ORAL_TABLET | Freq: Two times a day (BID) | ORAL | 3 refills | Status: DC

## 2016-12-06 NOTE — Telephone Encounter (Signed)
New message    *STAT* If patient is at the pharmacy, call can be transferred to refill team.   1. Which medications need to be refilled? (please list name of each medication and dose if known) metoprolol tartrate (LOPRESSOR) 25 MG tablet  2. Which pharmacy/location (including street and city if local pharmacy) is medication to be sent to? [facility] [place]  3. Do they need a 30 day or 90 day supply? 30 day   Pt has appt scheduled for November with Dr. Excell Seltzer. Pt states pharmacy would not refill because of it.

## 2016-12-06 NOTE — Telephone Encounter (Signed)
Pt's medication was sent to pt's pharmacy as requested. Confirmation received.  °

## 2016-12-06 NOTE — Telephone Encounter (Signed)
Pt's pharmacy requesting a refill on metoprolol 25 mg tablet. Dr. Excell Seltzer did not prescribe this medication. Would you like to refill this medication? Please advise

## 2016-12-07 ENCOUNTER — Encounter (HOSPITAL_COMMUNITY): Payer: Self-pay

## 2016-12-07 ENCOUNTER — Encounter (HOSPITAL_COMMUNITY)
Admission: RE | Admit: 2016-12-07 | Discharge: 2016-12-07 | Disposition: A | Discharge status: Home / Self Care | Payer: Medicare Other | Source: Ambulatory Visit | Attending: Cardiovascular Disease | Admitting: Cardiovascular Disease

## 2016-12-07 VITALS — BP 144/80 | HR 82 | Ht 64.5 in | Wt 215.4 lb

## 2016-12-07 DIAGNOSIS — I10 Essential (primary) hypertension: Secondary | ICD-10-CM | POA: Insufficient documentation

## 2016-12-07 DIAGNOSIS — Z87891 Personal history of nicotine dependence: Secondary | ICD-10-CM | POA: Insufficient documentation

## 2016-12-07 DIAGNOSIS — Z79899 Other long term (current) drug therapy: Secondary | ICD-10-CM | POA: Diagnosis not present

## 2016-12-07 DIAGNOSIS — I350 Nonrheumatic aortic (valve) stenosis: Secondary | ICD-10-CM | POA: Diagnosis not present

## 2016-12-07 DIAGNOSIS — Z794 Long term (current) use of insulin: Secondary | ICD-10-CM | POA: Insufficient documentation

## 2016-12-07 DIAGNOSIS — Z952 Presence of prosthetic heart valve: Secondary | ICD-10-CM | POA: Diagnosis not present

## 2016-12-07 DIAGNOSIS — E119 Type 2 diabetes mellitus without complications: Secondary | ICD-10-CM | POA: Insufficient documentation

## 2016-12-07 NOTE — Progress Notes (Signed)
Cardiac Individual Treatment Plan  Patient Details  Name: Vernon Ruiz MRN: [id number] Date of Birth: 1950-06-09 Referring Provider:     CARDIAC REHAB PHASE II ORIENTATION from 12/07/2016 in [facility] CARDIAC [facility]  Referring Provider  [name] MD      Initial Encounter Date:    CARDIAC REHAB PHASE II ORIENTATION from 12/07/2016 in [facility] CARDIAC REHAB  Date  12/07/16  Referring Provider  [name] MD      Visit Diagnosis: 10/04/16 S/P AVR  Patient's Home Medications on Admission:  Current Outpatient Prescriptions:  .  acetaminophen (TYLENOL) 325 MG tablet, Take 2 tablets (650 mg total) by mouth every 6 (six) hours as needed for mild pain., Disp: , Rfl:  .  apixaban (ELIQUIS) 5 MG TABS tablet, Take 5 mg by mouth 2 (two) times daily., Disp: , Rfl:  .  aspirin EC 81 MG tablet, Take 1 tablet (81 mg total) by mouth daily. (Patient not taking: Reported on 12/02/2016), Disp: , Rfl:  .  BYDUREON 2 MG PEN, Inject 2 mg into the skin once a week., Disp: , Rfl:  .  clindamycin (CLEOCIN) 300 MG capsule, Take 2 capsules (600mg ) by mouth one hour prior to your dental work, Disp: 2 capsule, Rfl: 0 .  Cyanocobalamin (VITAMIN B 12 PO), Take 1 tablet by mouth every other day. , Disp: , Rfl:  .  Dulaglutide 0.75 MG/0.5ML SOPN, Inject 0.75 mg into the skin once a week., Disp: , Rfl:  .  Insulin Glargine (TOUJEO SOLOSTAR [place]), Inject 50 Units into the skin every evening. , Disp: , Rfl:  .  losartan (COZAAR) 50 MG tablet, Take 1 tablet (50 mg total) by mouth daily., Disp: , Rfl:  .  metFORMIN (GLUCOPHAGE) 1000 MG tablet, Take 1,000 mg by mouth 2 (two) times daily. , Disp: , Rfl: 1 .  metoprolol tartrate (LOPRESSOR) 25 MG tablet, Take 1 tablet (25 mg total) by mouth 2 (two) times daily., Disp: 180 tablet, Rfl: 3  Past Medical History: Past Medical History:  Diagnosis Date  . Aortic stenosis    s/p AVR using a 23mm Edwards Magna-Ease pericardial valve  10/04/16  . Coronary artery disease   . Diabetes mellitus without complication (HCC)   . GERD (gastroesophageal reflux disease)   . Heart murmur   . Hypertension   . Obstructive sleep apnea    positive from a home test, still needs to get another study done  . Persistent atrial fibrillation (HCC)    s/p clipping of LA appendage 10/04/16    Tobacco Use: History  Smoking Status  . Former Smoker  . Types: Cigars  . Quit date: 09/16/2016  Smokeless Tobacco  . Never Used    Labs: Recent Review Flowsheet Data    Labs for ITP Cardiac and Pulmonary Rehab Latest Ref Rng & Units 10/04/2016 10/04/2016 10/04/2016 10/04/2016 10/05/2016   Hemoglobin A1c 4.8 - 5.6 % - - - - -   PHART 7.350 - 7.450 7.348(L) 7.347(L) 7.302(L) - -   PCO2ART 32.0 - 48.0 mmHg 43.6 42.3 48.5(H) - -   HCO3 20.0 - 28.0 mmol/L 24.1 23.2 23.8 - -   TCO2 0 - 100 mmol/L 25 25 25 24 22    ACIDBASEDEF 0.0 - 2.0 mmol/L 2.0 2.0 3.0(H) - -   O2SAT % 94.0 95.0 93.0 - -      Capillary Blood Glucose: Lab Results  Component Value Date   GLUCAP 204 (H) 10/09/2016   GLUCAP  223 (H) 10/08/2016   GLUCAP 289 (H) 10/08/2016   GLUCAP 196 (H) 10/08/2016   GLUCAP 150 (H) 10/08/2016     Exercise Target Goals: Date: 12/07/16  Exercise Program Goal: Individual exercise prescription set with THRR, safety & activity barriers. Participant demonstrates ability to understand and report RPE using BORG scale, to self-measure pulse accurately, and to acknowledge the importance of the exercise prescription.  Exercise Prescription Goal: Starting with aerobic activity 30 plus minutes a day, 3 days per week for initial exercise prescription. Provide home exercise prescription and guidelines that participant acknowledges understanding prior to discharge.  Activity Barriers & Risk Stratification:     Activity Barriers & Cardiac Risk Stratification - 12/07/16 0920      Activity Barriers & Cardiac Risk Stratification   Activity Barriers Other  (comment);Back Problems   Comments L>R knee issues   Cardiac Risk Stratification High      6 Minute Walk:     6 Minute Walk    Row Name 12/07/16 1151         6 Minute Walk   Phase Initial     Distance 1292 feet     Walk Time 6 minutes     # of Rest Breaks 0     MPH 2.4     METS 2.85     RPE 11     VO2 Peak 9.97     Symptoms Yes (comment)     Comments L knee discomfort     Resting HR 82 bpm     Resting BP 144/80     Resting Oxygen Saturation  97 %     Exercise Oxygen Saturation  during 6 min walk 98 %     Max Ex. HR 105 bpm     Max Ex. BP 152/78     2 Minute Post BP 138/80        Oxygen Initial Assessment:   Oxygen Re-Evaluation:   Oxygen Discharge (Final Oxygen Re-Evaluation):   Initial Exercise Prescription:     Initial Exercise Prescription - 12/07/16 1100      Date of Initial Exercise RX and Referring Provider   Date 12/07/16   Referring Provider [name] MD     Bike   Level 0.7   Minutes 10   METs 2.39     NuStep   Level 3   SPM 80   Minutes 10   METs 2.3     Track   Laps 8   Minutes 10   METs 2.39     Prescription Details   Frequency (times per week) 3   Duration Progress to 30 minutes of continuous aerobic without signs/symptoms of physical distress     Intensity   THRR 40-80% of Max Heartrate 62-124   Ratings of Perceived Exertion 11-13   Perceived Dyspnea 0-4     Progression   Progression Continue to progress workloads to maintain intensity without signs/symptoms of physical distress.     Resistance Training   Training Prescription Yes   Weight 2lbs   Reps 10-15      Perform Capillary Blood Glucose checks as needed.  Exercise Prescription Changes:   Exercise Comments:   Exercise Goals and Review:     Exercise Goals    Row Name 12/07/16 0921             Exercise Goals   Increase Physical Activity Yes       Intervention Provide advice, education, support and counseling about physical  activity/exercise needs.;Develop an individualized exercise prescription for aerobic and resistive training based on initial evaluation findings, risk stratification, comorbidities and participant's personal goals.       Expected Outcomes Achievement of increased cardiorespiratory fitness and enhanced flexibility, muscular endurance and strength shown through measurements of functional capacity and personal statement of participant.       Increase Strength and Stamina Yes       Intervention Provide advice, education, support and counseling about physical activity/exercise needs.;Develop an individualized exercise prescription for aerobic and resistive training based on initial evaluation findings, risk stratification, comorbidities and participant's personal goals.       Expected Outcomes Achievement of increased cardiorespiratory fitness and enhanced flexibility, muscular endurance and strength shown through measurements of functional capacity and personal statement of participant.       Able to understand and use rate of perceived exertion (RPE) scale Yes       Intervention Provide education and explanation on how to use RPE scale       Expected Outcomes Short Term: Able to use RPE daily in rehab to express subjective intensity level;Long Term:  Able to use RPE to guide intensity level when exercising independently       Understanding of Exercise Prescription Yes       Intervention Provide education, explanation, and written materials on patient's individual exercise prescription       Expected Outcomes Short Term: Able to explain program exercise prescription;Long Term: Able to explain home exercise prescription to exercise independently          Exercise Goals Re-Evaluation :    Discharge Exercise Prescription (Final Exercise Prescription Changes):   Nutrition:  Target Goals: Understanding of nutrition guidelines, daily intake of sodium 1500mg , cholesterol 200mg , calories 30% from fat and  7% or less from saturated fats, daily to have 5 or more servings of fruits and vegetables.  Biometrics:     Pre Biometrics - 12/07/16 1157      Pre Biometrics   Waist Circumference 45 inches   Hip Circumference 48 inches   Waist to Hip Ratio 0.94 %   Triceps Skinfold 28 mm   % Body Fat 36.2 %   Grip Strength 32 kg   Flexibility 9 in   Single Leg Stand 0 seconds       Nutrition Therapy Plan and Nutrition Goals:   Nutrition Discharge: Nutrition Scores:   Nutrition Goals Re-Evaluation:   Nutrition Goals Re-Evaluation:   Nutrition Goals Discharge (Final Nutrition Goals Re-Evaluation):   Psychosocial: Target Goals: Acknowledge presence or absence of significant depression and/or stress, maximize coping skills, provide positive support system. Participant is able to verbalize types and ability to use techniques and skills needed for reducing stress and depression.  Initial Review & Psychosocial Screening:     Initial Psych Review & Screening - 12/07/16 1239      Initial Review   Current issues with None Identified     Family Dynamics   Good Support System? Yes  family   Comments upon brief assessment, no psychosocial needs identified, no interventions necessary      Barriers   Psychosocial barriers to participate in program There are no identifiable barriers or psychosocial needs.     Screening Interventions   Interventions Encouraged to exercise      Quality of Life Scores:     Quality of Life - 12/07/16 1158      Quality of Life Scores   Health/Function Pre 26.8 %   Socioeconomic Pre 29 %  Psych/Spiritual Pre 28.29 %   Family Pre 28.8 %   GLOBAL Pre 27.78 %      PHQ-9: Recent Review Flowsheet Data    There is no flowsheet data to display.     Interpretation of Total Score  Total Score Depression Severity:  1-4 = Minimal depression, 5-9 = Mild depression, 10-14 = Moderate depression, 15-19 = Moderately severe depression, 20-27 = Severe  depression   Psychosocial Evaluation and Intervention:   Psychosocial Re-Evaluation:   Psychosocial Discharge (Final Psychosocial Re-Evaluation):   Vocational Rehabilitation: Provide vocational rehab assistance to qualifying candidates.   Vocational Rehab Evaluation & Intervention:   Education: Education Goals: Education classes will be provided on a weekly basis, covering required topics. Participant will state understanding/return demonstration of topics presented.  Learning Barriers/Preferences:     Learning Barriers/Preferences - 12/07/16 0920      Learning Barriers/Preferences   Learning Barriers Sight   Learning Preferences Skilled Demonstration;Written Material;Pictoral      Education Topics: Count Your Pulse:  -Group instruction provided by verbal instruction, demonstration, patient participation and written materials to support subject.  Instructors address importance of being able to find your pulse and how to count your pulse when at home without a heart monitor.  Patients get hands on experience counting their pulse with staff help and individually.   Heart Attack, Angina, and Risk Factor Modification:  -Group instruction provided by verbal instruction, video, and written materials to support subject.  Instructors address signs and symptoms of angina and heart attacks.    Also discuss risk factors for heart disease and how to make changes to improve heart health risk factors.   Functional Fitness:  -Group instruction provided by verbal instruction, demonstration, patient participation, and written materials to support subject.  Instructors address safety measures for doing things around the house.  Discuss how to get up and down off the floor, how to pick things up properly, how to safely get out of a chair without assistance, and balance training.   Meditation and Mindfulness:  -Group instruction provided by verbal instruction, patient participation, and  written materials to support subject.  Instructor addresses importance of mindfulness and meditation practice to help reduce stress and improve awareness.  Instructor also leads participants through a meditation exercise.    Stretching for Flexibility and Mobility:  -Group instruction provided by verbal instruction, patient participation, and written materials to support subject.  Instructors lead participants through series of stretches that are designed to increase flexibility thus improving mobility.  These stretches are additional exercise for major muscle groups that are typically performed during regular warm up and cool down.   Hands Only CPR:  -Group verbal, video, and participation provides a basic overview of AHA guidelines for community CPR. Role-play of emergencies allow participants the opportunity to practice calling for help and chest compression technique with discussion of AED use.   Hypertension: -Group verbal and written instruction that provides a basic overview of hypertension including the most recent diagnostic guidelines, risk factor reduction with self-care instructions and medication management.    Nutrition I class: Heart Healthy Eating:  -Group instruction provided by PowerPoint slides, verbal discussion, and written materials to support subject matter. The instructor gives an explanation and review of the Therapeutic Lifestyle Changes diet recommendations, which includes a discussion on lipid goals, dietary fat, sodium, fiber, plant stanol/sterol esters, sugar, and the components of a well-balanced, healthy diet.   Nutrition II class: Lifestyle Skills:  -Group instruction provided by PowerPoint slides, verbal discussion,  and written materials to support subject matter. The instructor gives an explanation and review of label reading, grocery shopping for heart health, heart healthy recipe modifications, and ways to make healthier choices when eating out.   Diabetes  Question & Answer:  -Group instruction provided by PowerPoint slides, verbal discussion, and written materials to support subject matter. The instructor gives an explanation and review of diabetes co-morbidities, pre- and post-prandial blood glucose goals, pre-exercise blood glucose goals, signs, symptoms, and treatment of hypoglycemia and hyperglycemia, and foot care basics.   Diabetes Blitz:  -Group instruction provided by PowerPoint slides, verbal discussion, and written materials to support subject matter. The instructor gives an explanation and review of the physiology behind type 1 and type 2 diabetes, diabetes medications and rational behind using different medications, pre- and post-prandial blood glucose recommendations and Hemoglobin A1c goals, diabetes diet, and exercise including blood glucose guidelines for exercising safely.    Portion Distortion:  -Group instruction provided by PowerPoint slides, verbal discussion, written materials, and food models to support subject matter. The instructor gives an explanation of serving size versus portion size, changes in portions sizes over the last 20 years, and what consists of a serving from each food group.   Stress Management:  -Group instruction provided by verbal instruction, video, and written materials to support subject matter.  Instructors review role of stress in heart disease and how to cope with stress positively.     Exercising on Your Own:  -Group instruction provided by verbal instruction, power point, and written materials to support subject.  Instructors discuss benefits of exercise, components of exercise, frequency and intensity of exercise, and end points for exercise.  Also discuss use of nitroglycerin and activating EMS.  Review options of places to exercise outside of rehab.  Review guidelines for sex with heart disease.   Cardiac Drugs I:  -Group instruction provided by verbal instruction and written materials to  support subject.  Instructor reviews cardiac drug classes: antiplatelets, anticoagulants, beta blockers, and statins.  Instructor discusses reasons, side effects, and lifestyle considerations for each drug class.   Cardiac Drugs II:  -Group instruction provided by verbal instruction and written materials to support subject.  Instructor reviews cardiac drug classes: angiotensin converting enzyme inhibitors (ACE-I), angiotensin II receptor blockers (ARBs), nitrates, and calcium channel blockers.  Instructor discusses reasons, side effects, and lifestyle considerations for each drug class.   Anatomy and Physiology of the Circulatory System:  Group verbal and written instruction and models provide basic cardiac anatomy and physiology, with the coronary electrical and arterial systems. Review of: AMI, Angina, Valve disease, Heart Failure, Peripheral Artery Disease, Cardiac Arrhythmia, Pacemakers, and the ICD.   Other Education:  -Group or individual verbal, written, or video instructions that support the educational goals of the cardiac rehab program.   Knowledge Questionnaire Score:     Knowledge Questionnaire Score - 12/07/16 1150      Knowledge Questionnaire Score   Pre Score 13/24      Core Components/Risk Factors/Patient Goals at Admission:     Personal Goals and Risk Factors at Admission - 12/07/16 1158      Core Components/Risk Factors/Patient Goals on Admission   Diabetes Yes   Intervention Provide education about signs/symptoms and action to take for hypo/hyperglycemia.;Provide education about proper nutrition, including hydration, and aerobic/resistive exercise prescription along with prescribed medications to achieve blood glucose in normal ranges: Fasting glucose 65-99 mg/dL   Expected Outcomes Short Term: Participant verbalizes understanding of the signs/symptoms and immediate care  of hyper/hypoglycemia, proper foot care and importance of medication, aerobic/resistive  exercise and nutrition plan for blood glucose control.;Long Term: Attainment of HbA1C < 7%.   Hypertension Yes   Intervention Provide education on lifestyle modifcations including regular physical activity/exercise, weight management, moderate sodium restriction and increased consumption of fresh fruit, vegetables, and low fat dairy, alcohol moderation, and smoking cessation.;Monitor prescription use compliance.   Expected Outcomes Short Term: Continued assessment and intervention until BP is < 140/38mm HG in hypertensive participants. < 130/19mm HG in hypertensive participants with diabetes, heart failure or chronic kidney disease.;Long Term: Maintenance of blood pressure at goal levels.   Lipids Yes   Intervention Provide education and support for participant on nutrition & aerobic/resistive exercise along with prescribed medications to achieve LDL 70mg , HDL >40mg .   Expected Outcomes Short Term: Participant states understanding of desired cholesterol values and is compliant with medications prescribed. Participant is following exercise prescription and nutrition guidelines.;Long Term: Cholesterol controlled with medications as prescribed, with individualized exercise RX and with personalized nutrition plan. Value goals: LDL < 70mg , HDL > 40 mg.      Core Components/Risk Factors/Patient Goals Review:    Core Components/Risk Factors/Patient Goals at Discharge (Final Review):    ITP Comments:     ITP Comments    Row Name 12/07/16 0908           ITP Comments Dr. Armanda Magic, Medical Director           Comments: Patient attended orientation from [id number] to 1034   to review rules and guidelines for program. Completed 6 minute walk test, Intitial ITP, and exercise prescription.  VSS. Telemetry-sinus rhythm, non specific ST depression, T wave in version.  Asymptomatic.

## 2016-12-07 NOTE — Progress Notes (Signed)
Kayshawn Schmuhl Vasconez 66 y.o. male DOB: 06-24-1950 MRN: [id number]      Nutrition Note  1. 10/04/16 S/P AVR    Past Medical History:  Diagnosis Date  . Aortic stenosis    s/p AVR using a 60mm Edwards Magna-Ease pericardial valve 10/04/16  . Coronary artery disease   . Diabetes mellitus without complication (HCC)   . GERD (gastroesophageal reflux disease)   . Heart murmur   . Hypertension   . Obstructive sleep apnea    positive from a home test, still needs to get another study done  . Persistent atrial fibrillation (HCC)    s/p clipping of LA appendage 10/04/16   Meds reviewed. Toujeo, Bydureon, Metformin noted  HT: Ht Readings from Last 1 Encounters:  12/07/16 5' 4.5" (1.638 m)    WT: Wt Readings from Last 3 Encounters:  12/07/16 215 lb 6.2 oz (97.7 kg)  11/17/16 214 lb (97.1 kg)  10/27/16 205 lb 12.8 oz (93.4 kg)     BMI 36.4   Current tobacco use? Recently quit tobacco use 09/16/16  Labs:  Lipid Panel  No results found for: CHOL, TRIG, HDL, CHOLHDL, VLDL, LDLCALC, LDLDIRECT  Lab Results  Component Value Date   HGBA1C 9.5 (H) 09/30/2016   CBG (last 3)  No results for input(s): GLUCAP in the last 72 hours.  Nutrition Note Spoke with pt. Nutrition plan and goals reviewed with pt. Pt is following Step 1 of the Therapeutic Lifestyle Changes diet. Pt wants to lose wt but has not actively been trying to lose wt at this time. Wt loss tips reviewed.  Pt is diabetic. Last A1c indicates blood glucose poorly controlled. Pt checks CBG's 3 or more times a week. Pt expressed understanding of the information reviewed. Pt aware of nutrition education classes offered and  plans on attending nutrition classes.  Nutrition Diagnosis ? Food-and nutrition-related knowledge deficit related to lack of exposure to information as related to diagnosis of: ? CVD ? DM ? Obesity related to excessive energy intake as evidenced by a BMI of 36.4  Nutrition Intervention ? Pt's individual nutrition plan and  goals reviewed with pt.  Nutrition Goal(s):  ? Pt to identify and limit food sources of saturated fat, trans fat, and sodium ? Pt to identify food quantities necessary to achieve weight loss of 6-24 lb (2.7-10.9 kg) at graduation from cardiac rehab. Goal wt loss of 40 lb desired.  ? CBG concentrations in the normal range or as close to normal as is safely possible.  Plan:  Pt to attend nutrition classes ? Nutrition I ? Nutrition II ? Portion Distortion ? Diabetes Blitz ? Diabetes Q & A Will provide client-centered nutrition education as part of interdisciplinary care.   Monitor and evaluate progress toward nutrition goal with team.  Mickle Plumb, M.Ed, RD, LDN, CDE 12/07/2016 3:22 PM

## 2016-12-08 DIAGNOSIS — E1165 Type 2 diabetes mellitus with hyperglycemia: Secondary | ICD-10-CM | POA: Diagnosis not present

## 2016-12-15 ENCOUNTER — Encounter (HOSPITAL_COMMUNITY)
Admission: RE | Admit: 2016-12-15 | Discharge: 2016-12-15 | Disposition: A | Discharge status: Home / Self Care | Payer: Medicare Other | Source: Ambulatory Visit | Attending: Cardiovascular Disease | Admitting: Cardiovascular Disease

## 2016-12-15 VITALS — Wt 216.1 lb

## 2016-12-15 DIAGNOSIS — I10 Essential (primary) hypertension: Secondary | ICD-10-CM | POA: Insufficient documentation

## 2016-12-15 DIAGNOSIS — E119 Type 2 diabetes mellitus without complications: Secondary | ICD-10-CM | POA: Insufficient documentation

## 2016-12-15 DIAGNOSIS — Z87891 Personal history of nicotine dependence: Secondary | ICD-10-CM | POA: Diagnosis not present

## 2016-12-15 DIAGNOSIS — Z794 Long term (current) use of insulin: Secondary | ICD-10-CM | POA: Diagnosis not present

## 2016-12-15 DIAGNOSIS — Z952 Presence of prosthetic heart valve: Secondary | ICD-10-CM | POA: Insufficient documentation

## 2016-12-15 DIAGNOSIS — Z79899 Other long term (current) drug therapy: Secondary | ICD-10-CM | POA: Diagnosis not present

## 2016-12-15 DIAGNOSIS — I350 Nonrheumatic aortic (valve) stenosis: Secondary | ICD-10-CM | POA: Insufficient documentation

## 2016-12-15 LAB — GLUCOSE, CAPILLARY
GLUCOSE-CAPILLARY: 227 mg/dL — AB (ref 65–99)
GLUCOSE-CAPILLARY: 235 mg/dL — AB (ref 65–99)

## 2016-12-15 NOTE — Progress Notes (Signed)
Daily Session Note  Patient Details  Name: Vernon Ruiz MRN: [id number] Date of Birth: 03/10/1951 Referring Provider:     CARDIAC REHAB PHASE II ORIENTATION from 12/07/2016 in [facility]  Referring Provider  Cooper,, Michael MD      Encounter Date: 12/15/2016  Check In:     Session Check In - 12/15/16 1034      Check-In   Location [facility]   Staff Present Amber Fair, MS, ACSM RCEP, Exercise Physiologist;Olinty Richards, MS, ACSM CEP, Exercise Physiologist;Carlette Carlton, RN, BSN;Maria Whitaker, RN, BSN   Supervising physician immediately available to respond to emergencies [facility] immediately available   Physician(s) Dr. Grunz   Medication changes reported     No   Fall or balance concerns reported    No   Tobacco Cessation No Change   Warm-up and Cool-down Performed as group-led instruction   Resistance Training Performed No   VAD Patient? No     Pain Assessment   Currently in Pain? No/denies      Capillary Blood Glucose: Results for orders placed or performed during the hospital encounter of 12/15/16 (from the past 24 hour(s))  Glucose, capillary     Status: Abnormal   Collection Time: 12/15/16  8:46 AM  Result Value Ref Range   Glucose-Capillary 227 (H) 65 - 99 mg/dL  Glucose, capillary     Status: Abnormal   Collection Time: 12/15/16  9:42 AM  Result Value Ref Range   Glucose-Capillary 235 (H) 65 - 99 mg/dL      History  Smoking Status  . Former Smoker  . Types: Cigars  . Quit date: 09/16/2016  Smokeless Tobacco  . Never Used    Goals Met:  Exercise tolerated well Personal goals reviewed  Goals Unmet:  Not Applicable  Comments:  Pt started cardiac rehab today.  Pt tolerated light exercise without difficulty. VSS, telemetry-SR with no noted ectopy, asymptomatic.  Medication list reconciled. Pt denies barriers to medication compliance.  PSYCHOSOCIAL ASSESSMENT:  PHQ-0. Pt exhibits positive  coping skills, hopeful outlook with supportive family. Pt completed pre assessment quality of life survey.  Pt scored the following:     Quality of Life - 12/07/16 1158      Quality of Life Scores   Health/Function Pre 26.8 %   Socioeconomic Pre 29 %   Psych/Spiritual Pre 28.29 %   Family Pre 28.8 %   GLOBAL Pre 27.78 %      No psychosocial needs identified at this time, no psychosocial interventions necessary.    Pt enjoys hunting and fishing. Pt desires to lose 10 pounds as short term goal. Pt encouraged to attend the Tuesday nutrition classes which pt already went to the [employer] class on yesterday.  Pt oriented to exercise equipment and routine.    Understanding verbalized. Carlette Carlton RN, BSN Cardiac and Pulmonary Rehab Nurse Navigator      Dr. Traci Turner is Medical Director for Cardiac Rehab at [facility]. 

## 2016-12-16 DIAGNOSIS — Z23 Encounter for immunization: Secondary | ICD-10-CM | POA: Diagnosis not present

## 2016-12-16 DIAGNOSIS — L821 Other seborrheic keratosis: Secondary | ICD-10-CM | POA: Diagnosis not present

## 2016-12-17 ENCOUNTER — Encounter (HOSPITAL_COMMUNITY)
Admission: RE | Admit: 2016-12-17 | Discharge: 2016-12-17 | Disposition: A | Discharge status: Home / Self Care | Payer: Medicare Other | Source: Ambulatory Visit | Attending: Cardiovascular Disease | Admitting: Cardiovascular Disease

## 2016-12-17 DIAGNOSIS — Z87891 Personal history of nicotine dependence: Secondary | ICD-10-CM | POA: Diagnosis not present

## 2016-12-17 DIAGNOSIS — I10 Essential (primary) hypertension: Secondary | ICD-10-CM | POA: Diagnosis not present

## 2016-12-17 DIAGNOSIS — I350 Nonrheumatic aortic (valve) stenosis: Secondary | ICD-10-CM | POA: Diagnosis not present

## 2016-12-17 DIAGNOSIS — E119 Type 2 diabetes mellitus without complications: Secondary | ICD-10-CM | POA: Diagnosis not present

## 2016-12-17 DIAGNOSIS — Z794 Long term (current) use of insulin: Secondary | ICD-10-CM | POA: Diagnosis not present

## 2016-12-17 DIAGNOSIS — Z952 Presence of prosthetic heart valve: Secondary | ICD-10-CM

## 2016-12-17 LAB — GLUCOSE, CAPILLARY: Glucose-Capillary: 161 mg/dL — ABNORMAL HIGH (ref 65–99)

## 2016-12-20 ENCOUNTER — Encounter (HOSPITAL_COMMUNITY)
Admission: RE | Admit: 2016-12-20 | Discharge: 2016-12-20 | Disposition: A | Discharge status: Home / Self Care | Payer: Medicare Other | Source: Ambulatory Visit | Attending: Cardiovascular Disease | Admitting: Cardiovascular Disease

## 2016-12-20 DIAGNOSIS — Z952 Presence of prosthetic heart valve: Secondary | ICD-10-CM | POA: Diagnosis not present

## 2016-12-20 DIAGNOSIS — I350 Nonrheumatic aortic (valve) stenosis: Secondary | ICD-10-CM | POA: Diagnosis not present

## 2016-12-20 DIAGNOSIS — I10 Essential (primary) hypertension: Secondary | ICD-10-CM | POA: Diagnosis not present

## 2016-12-20 DIAGNOSIS — E119 Type 2 diabetes mellitus without complications: Secondary | ICD-10-CM | POA: Diagnosis not present

## 2016-12-20 DIAGNOSIS — Z794 Long term (current) use of insulin: Secondary | ICD-10-CM | POA: Diagnosis not present

## 2016-12-20 DIAGNOSIS — Z87891 Personal history of nicotine dependence: Secondary | ICD-10-CM | POA: Diagnosis not present

## 2016-12-22 ENCOUNTER — Encounter (HOSPITAL_COMMUNITY): Payer: Medicare Other

## 2016-12-22 NOTE — Progress Notes (Signed)
Cardiac Individual Treatment Plan  Patient Details  Name: Vernon Ruiz MRN: [id number] Date of Birth: Jun 28, 1950 Referring Provider:     CARDIAC REHAB PHASE II ORIENTATION from 12/07/2016 in [facility]ARD[facility]  Referring Provider  [name] MD      Initial Encounter Date:    CARDIAC REHAB PHASE II ORIENTATION from 12/07/2016 in [facility] CARDIAC REHAB  Date  12/07/16  Referring Provider  [name] MD      Visit Diagnosis: 10/04/16 S/P AVR  Patient's Home Medications on Admission:  Current Outpatient Prescriptions:  .  acetaminophen (TYLENOL) 325 MG tablet, Take 2 tablets (650 mg total) by mouth every 6 (six) hours as needed for mild pain., Disp: , Rfl:  .  apixaban (ELIQUIS) 5 MG TABS tablet, Take 5 mg by mouth 2 (two) times daily., Disp: , Rfl:  .  aspirin EC 81 MG tablet, Take 1 tablet (81 mg total) by mouth daily., Disp: , Rfl:  .  BYDUREON 2 MG PEN, Inject 2 mg into the skin once a week., Disp: , Rfl:  .  clindamycin (CLEOCIN) 300 MG capsule, Take 2 capsules ( ) by mouth one hour prior to your dental work, Disp: 2 capsule, Rfl: 0 .  Cyanocobalamin (VITAMIN B 12 PO), Take 1 tablet by mouth every other day. , Disp: , Rfl:  .  Dulaglutide 0.75 MG/0.5ML SOPN, Inject 0.75 mg into the skin once a week., Disp: , Rfl:  .  Insulin Glargine (TOUJEO SOLOSTAR ), Inject 50 Units into the skin every evening. , Disp: , Rfl:  .  losartan (COZAAR) 50 MG tablet, Take 1 tablet (50 mg total) by mouth daily., Disp: , Rfl:  .  metFORMIN (GLUCOPHAGE) 1000 MG tablet, Take 1,000 mg by mouth 2 (two) times daily. , Disp: , Rfl: 1 .  metoprolol tartrate (LOPRESSOR) 25 MG tablet, Take 1 tablet (25 mg total) by mouth 2 (two) times daily., Disp: 180 tablet, Rfl: 3  Past Medical History: Past Medical History:  Diagnosis Date  . Aortic stenosis    s/p AVR using a 23mm Edwards Magna-Ease pericardial valve 10/04/16  . Coronary artery disease   .  Diabetes mellitus without complication (HCC)   . GERD (gastroesophageal reflux disease)   . Heart murmur   . Hypertension   . Obstructive sleep apnea    positive from a home test, still needs to get another study done  . Persistent atrial fibrillation (HCC)    s/p clipping of LA appendage 10/04/16    Tobacco Use: History  Smoking Status  . Former Smoker  . Types: Cigars  . Quit date: 09/16/2016  Smokeless Tobacco  . Never Used    Labs: Recent Review Flowsheet Data    Labs for ITP Cardiac and Pulmonary Rehab Latest Ref Rng & Units 10/04/2016 10/04/2016 10/04/2016 10/04/2016 10/05/2016   Hemoglobin A1c 4.8 - 5.6 % - - - - -   PHART 7.350 - 7.450 7.348(L) 7.347(L) 7.302(L) - -   PCO2ART 32.0 - 48.0 mmHg 43.6 42.3 48.5(H) - -   HCO3 20.0 - 28.0 mmol/L 24.1 23.2 23.8 - -   TCO2 0 - 100 mmol/L ACIDBASEDEF 0.0 - 2.0 mmol/L 2.0 2.0 3.0(H) - -   O2SAT % 94.0 95.0 93.0 - -      Capillary Blood Glucose: Lab Results  Component Value Date   GLUCAP 161 (H) 12/17/2016   GLUCAP 235 (H) 12/15/2016   GLUCAP  227 (H) 12/15/2016   GLUCAP 204 (H) 10/09/2016   GLUCAP 223 (H) 10/08/2016     Exercise Target Goals:    Exercise Program Goal: Individual exercise prescription set with THRR, safety & activity barriers. Participant demonstrates ability to understand and report RPE using BORG scale, to self-measure pulse accurately, and to acknowledge the importance of the exercise prescription.  Exercise Prescription Goal: Starting with aerobic activity 30 plus minutes a day, 3 days per week for initial exercise prescription. Provide home exercise prescription and guidelines that participant acknowledges understanding prior to discharge.  Activity Barriers & Risk Stratification:     Activity Barriers & Cardiac Risk Stratification - 12/07/16 0920      Activity Barriers & Cardiac Risk Stratification   Activity Barriers Other (comment);Back Problems   Comments L>R knee issues    Cardiac Risk Stratification High      6 Minute Walk:     6 Minute Walk    Row Name 12/07/16 1151         6 Minute Walk   Phase Initial     Distance 1292 feet     Walk Time 6 minutes     # of Rest Breaks 0     MPH 2.4     METS 2.85     RPE 11     VO2 Peak 9.97     Symptoms Yes (comment)     Comments L knee discomfort     Resting HR 82 bpm     Resting BP 144/80     Resting Oxygen Saturation  97 %     Exercise Oxygen Saturation  during 6 min walk 98 %     Max Ex. HR 105 bpm     Max Ex. BP 152/78     2 Minute Post BP 138/80        Oxygen Initial Assessment:   Oxygen Re-Evaluation:   Oxygen Discharge (Final Oxygen Re-Evaluation):   Initial Exercise Prescription:     Initial Exercise Prescription - 12/07/16 1100      Date of Initial Exercise RX and Referring Provider   Date 12/07/16   Referring Provider [name] MD     Bike   Level 0.7   Minutes 10   METs 2.39     NuStep   Level 3   SPM 80   Minutes 10   METs 2.3     Track   Laps 8   Minutes 10   METs 2.39     Prescription Details   Frequency (times per week) 3   Duration Progress to 30 minutes of continuous aerobic without signs/symptoms of physical distress     Intensity   THRR 40-80% of Max Heartrate 62-124   Ratings of Perceived Exertion 11-13   Perceived Dyspnea 0-4     Progression   Progression Continue to progress workloads to maintain intensity without signs/symptoms of physical distress.     Resistance Training   Training Prescription Yes   Weight 2lbs   Reps 10-15      Perform Capillary Blood Glucose checks as needed.  Exercise Prescription Changes:     Exercise Prescription Changes    Row Name 12/15/16 1647 12/20/16 1600           Response to Exercise   Blood Pressure (Admit) 136/82 130/64      Blood Pressure (Exercise) 144/80 164/90      Blood Pressure (Exit) 126/74 122/72      Heart Rate (Admit) 81  bpm 89 bpm      Heart Rate (Exercise) 101 bpm 104 bpm       Heart Rate (Exit) 71 bpm 88 bpm      Rating of Perceived Exertion (Exercise) 12 11      Symptoms none none      Comments pt was oriented to exercise equipment today. Pt responded well to exercise session pt was oriented to exercise equipment today. Pt responded well to exercise session      Duration Continue with 30 min of aerobic exercise without signs/symptoms of physical distress. Continue with 30 min of aerobic exercise without signs/symptoms of physical distress.      Intensity THRR unchanged THRR unchanged        Progression   Progression Continue to progress workloads to maintain intensity without signs/symptoms of physical distress. Continue to progress workloads to maintain intensity without signs/symptoms of physical distress.      Average METs 2.2 2.2        Resistance Training   Training Prescription Yes Yes      Weight 2lbs 3lbs      Reps 10-15 10-15      Time 10 Minutes 10 Minutes        Bike   Level 0.7 0.7      Minutes 10 10      METs 2.35 2.35        NuStep   Level 3 3      SPM 70 80      Minutes 10 10      METs 1.9 2.6        Track   Laps 8 10      Minutes 10 10      METs 2.39 2.74         Exercise Comments:   Exercise Goals and Review:     Exercise Goals    Row Name 12/07/16 0921             Exercise Goals   Increase Physical Activity Yes       Intervention Provide advice, education, support and counseling about physical activity/exercise needs.;Develop an individualized exercise prescription for aerobic and resistive training based on initial evaluation findings, risk stratification, comorbidities and participant's personal goals.       Expected Outcomes Achievement of increased cardiorespiratory fitness and enhanced flexibility, muscular endurance and strength shown through measurements of functional capacity and personal statement of participant.       Increase Strength and Stamina Yes       Intervention Provide advice, education,  support and counseling about physical activity/exercise needs.;Develop an individualized exercise prescription for aerobic and resistive training based on initial evaluation findings, risk stratification, comorbidities and participant's personal goals.       Expected Outcomes Achievement of increased cardiorespiratory fitness and enhanced flexibility, muscular endurance and strength shown through measurements of functional capacity and personal statement of participant.       Able to understand and use rate of perceived exertion (RPE) scale Yes       Intervention Provide education and explanation on how to use RPE scale       Expected Outcomes Short Term: Able to use RPE daily in rehab to express subjective intensity level;Long Term:  Able to use RPE to guide intensity level when exercising independently       Understanding of Exercise Prescription Yes       Intervention Provide education, explanation, and written materials on patient's individual exercise prescription  Expected Outcomes Short Term: Able to explain program exercise prescription;Long Term: Able to explain home exercise prescription to exercise independently          Exercise Goals Re-Evaluation :     Exercise Goals Re-Evaluation    Row Name 12/21/16 1210             Exercise Goal Re-Evaluation   Exercise Goals Review Increase Physical Activity;Understanding of Exercise Prescription;Able to understand and use rate of perceived exertion (RPE) scale;Increase Strength and Stamina       Comments Pt is exercising independently in cardiac rehab. Pt is able to exercise for 30 minutes without difficulty; will continue to monitor.       Expected Outcomes Pt will continue to improve in cardiorespiratory fitness and adhere to exercise in cardiac rehab.           Discharge Exercise Prescription (Final Exercise Prescription Changes):     Exercise Prescription Changes - 12/20/16 1600      Response to Exercise   Blood Pressure  (Admit) 130/64   Blood Pressure (Exercise) 164/90   Blood Pressure (Exit) 122/72   Heart Rate (Admit) 89 bpm   Heart Rate (Exercise) 104 bpm   Heart Rate (Exit) 88 bpm   Rating of Perceived Exertion (Exercise) 11   Symptoms none   Comments pt was oriented to exercise equipment today. Pt responded well to exercise session   Duration Continue with 30 min of aerobic exercise without signs/symptoms of physical distress.   Intensity THRR unchanged     Progression   Progression Continue to progress workloads to maintain intensity without signs/symptoms of physical distress.   Average METs 2.2     Resistance Training   Training Prescription Yes   Weight 3lbs   Reps 10-15   Time 10 Minutes     Bike   Level 0.7   Minutes 10   METs 2.35     NuStep   Level 3   SPM 80   Minutes 10   METs 2.6     Track   Laps 10   Minutes 10   METs 2.74      Nutrition:  Target Goals: Understanding of nutrition guidelines, daily intake of sodium 1500mg , cholesterol 200mg , calories 30% from fat and 7% or less from saturated fats, daily to have 5 or more servings of fruits and vegetables.  Biometrics:     Pre Biometrics - 12/07/16 1157      Pre Biometrics   Waist Circumference 45 inches   Hip Circumference 48 inches   Waist to Hip Ratio 0.94 %   Triceps Skinfold 28 mm   % Body Fat 36.2 %   Grip Strength 32 kg   Flexibility 9 in   Single Leg Stand 0 seconds       Nutrition Therapy Plan and Nutrition Goals:     Nutrition Therapy & Goals - 12/07/16 1529      Nutrition Therapy   Diet Carb Modified, TLC     Personal Nutrition Goals   Nutrition Goal Pt to identify and limit food sources of saturated fat, trans fat, and sodium   Personal Goal #2 Pt to identify food quantities necessary to achieve weight loss of 6-24 lb (2.7-10.9 kg) at graduation from cardiac rehab. Goal wt loss of 40 lb desired.    Personal Goal #3 CBG concentrations in the normal range or as close to normal as is  safely possible.     Intervention Plan   Intervention Prescribe,  educate and counsel regarding individualized specific dietary modifications aiming towards targeted core components such as weight, hypertension, lipid management, diabetes, heart failure and other comorbidities.   Expected Outcomes Short Term Goal: Understand basic principles of dietary content, such as calories, fat, sodium, cholesterol and nutrients.;Long Term Goal: Adherence to prescribed nutrition plan.      Nutrition Discharge: Nutrition Scores:     Nutrition Assessments - 12/07/16 1529      MEDFICTS Scores   Pre Score 65      Nutrition Goals Re-Evaluation:   Nutrition Goals Re-Evaluation:   Nutrition Goals Discharge (Final Nutrition Goals Re-Evaluation):   Psychosocial: Target Goals: Acknowledge presence or absence of significant depression and/or stress, maximize coping skills, provide positive support system. Participant is able to verbalize types and ability to use techniques and skills needed for reducing stress and depression.  Initial Review & Psychosocial Screening:     Initial Psych Review & Screening - 12/07/16 1239      Initial Review   Current issues with None Identified     Family Dynamics   Good Support System? Yes  family   Comments upon brief assessment, no psychosocial needs identified, no interventions necessary      Barriers   Psychosocial barriers to participate in program There are no identifiable barriers or psychosocial needs.     Screening Interventions   Interventions Encouraged to exercise      Quality of Life Scores:     Quality of Life - 12/07/16 1158      Quality of Life Scores   Health/Function Pre 26.8 %   Socioeconomic Pre 29 %   Psych/Spiritual Pre 28.29 %   Family Pre 28.8 %   GLOBAL Pre 27.78 %      PHQ-9: Recent Review Flowsheet Data    Depression screen [facility] 2/9 12/15/2016   Decreased Interest 0   Down, Depressed, Hopeless 0   PHQ - 2 Score 0      Interpretation of Total Score  Total Score Depression Severity:  1-4 = Minimal depression, 5-9 = Mild depression, 10-14 = Moderate depression, 15-19 = Moderately severe depression, 20-27 = Severe depression   Psychosocial Evaluation and Intervention:     Psychosocial Evaluation - 12/15/16 1308      Psychosocial Evaluation & Interventions   Interventions Encouraged to exercise with the program and follow exercise prescription   Comments Pt denies any complaints, stress or depression. Pt feels supported by his family.   Continue Psychosocial Services  Follow up required by staff      Psychosocial Re-Evaluation:     Psychosocial Re-Evaluation    Row Name 12/22/16 1625             Psychosocial Re-Evaluation   Current issues with None Identified       Comments no psychosocial needs identified, no interventions necessary        Expected Outcomes pt will demonstrate positive coping skills and good outlook        Interventions Encouraged to attend Cardiac Rehabilitation for the exercise;Stress management education;Relaxation education       Continue Psychosocial Services  No Follow up required          Psychosocial Discharge (Final Psychosocial Re-Evaluation):     Psychosocial Re-Evaluation - 12/22/16 1625      Psychosocial Re-Evaluation   Current issues with None Identified   Comments no psychosocial needs identified, no interventions necessary    Expected Outcomes pt will demonstrate positive coping skills and good outlook  Interventions Encouraged to attend Cardiac Rehabilitation for the exercise;Stress management education;Relaxation education   Continue Psychosocial Services  No Follow up required      Vocational Rehabilitation: Provide vocational rehab assistance to qualifying candidates.   Vocational Rehab Evaluation & Intervention:   Education: Education Goals: Education classes will be provided on a weekly basis, covering required topics. Participant  will state understanding/return demonstration of topics presented.  Learning Barriers/Preferences:     Learning Barriers/Preferences - 12/07/16 0920      Learning Barriers/Preferences   Learning Barriers Sight   Learning Preferences Skilled Demonstration;Written Material;Pictoral      Education Topics: Count Your Pulse:  -Group instruction provided by verbal instruction, demonstration, patient participation and written materials to support subject.  Instructors address importance of being able to find your pulse and how to count your pulse when at home without a heart monitor.  Patients get hands on experience counting their pulse with staff help and individually.   CARDIAC REHAB PHASE II EXERCISE from 12/17/2016 in [facility] CARDIAC REHAB  Date  12/17/16  Instruction Review Code  2- meets goals/outcomes      Heart Attack, Angina, and Risk Factor Modification:  -Group instruction provided by verbal instruction, video, and written materials to support subject.  Instructors address signs and symptoms of angina and heart attacks.    Also discuss risk factors for heart disease and how to make changes to improve heart health risk factors.   Functional Fitness:  -Group instruction provided by verbal instruction, demonstration, patient participation, and written materials to support subject.  Instructors address safety measures for doing things around the house.  Discuss how to get up and down off the floor, how to pick things up properly, how to safely get out of a chair without assistance, and balance training.   Meditation and Mindfulness:  -Group instruction provided by verbal instruction, patient participation, and written materials to support subject.  Instructor addresses importance of mindfulness and meditation practice to help reduce stress and improve awareness.  Instructor also leads participants through a meditation exercise.    Stretching for Flexibility and  Mobility:  -Group instruction provided by verbal instruction, patient participation, and written materials to support subject.  Instructors lead participants through series of stretches that are designed to increase flexibility thus improving mobility.  These stretches are additional exercise for major muscle groups that are typically performed during regular warm up and cool down.   Hands Only CPR:  -Group verbal, video, and participation provides a basic overview of AHA guidelines for community CPR. Role-play of emergencies allow participants the opportunity to practice calling for help and chest compression technique with discussion of AED use.   Hypertension: -Group verbal and written instruction that provides a basic overview of hypertension including the most recent diagnostic guidelines, risk factor reduction with self-care instructions and medication management.    Nutrition I class: Heart Healthy Eating:  -Group instruction provided by PowerPoint slides, verbal discussion, and written materials to support subject matter. The instructor gives an explanation and review of the Therapeutic Lifestyle Changes diet recommendations, which includes a discussion on lipid goals, dietary fat, sodium, fiber, plant stanol/sterol esters, sugar, and the components of a well-balanced, healthy diet.   CARDIAC REHAB PHASE II EXERCISE from 12/17/2016 in [facility] CARDIAC REHAB  Date  12/14/16  Educator  RD  Instruction Review Code  2- meets goals/outcomes      Nutrition II class: Lifestyle Skills:  -Group instruction provided by PowerPoint  slides, verbal discussion, and written materials to support subject matter. The instructor gives an explanation and review of label reading, grocery shopping for heart health, heart healthy recipe modifications, and ways to make healthier choices when eating out.   Diabetes Question & Answer:  -Group instruction provided by PowerPoint slides,  verbal discussion, and written materials to support subject matter. The instructor gives an explanation and review of diabetes co-morbidities, pre- and post-prandial blood glucose goals, pre-exercise blood glucose goals, signs, symptoms, and treatment of hypoglycemia and hyperglycemia, and foot care basics.   Diabetes Blitz:  -Group instruction provided by PowerPoint slides, verbal discussion, and written materials to support subject matter. The instructor gives an explanation and review of the physiology behind type 1 and type 2 diabetes, diabetes medications and rational behind using different medications, pre- and post-prandial blood glucose recommendations and Hemoglobin A1c goals, diabetes diet, and exercise including blood glucose guidelines for exercising safely.    Portion Distortion:  -Group instruction provided by PowerPoint slides, verbal discussion, written materials, and food models to support subject matter. The instructor gives an explanation of serving size versus portion size, changes in portions sizes over the last 20 years, and what consists of a serving from each food group.   Stress Management:  -Group instruction provided by verbal instruction, video, and written materials to support subject matter.  Instructors review role of stress in heart disease and how to cope with stress positively.     Exercising on Your Own:  -Group instruction provided by verbal instruction, power point, and written materials to support subject.  Instructors discuss benefits of exercise, components of exercise, frequency and intensity of exercise, and end points for exercise.  Also discuss use of nitroglycerin and activating EMS.  Review options of places to exercise outside of rehab.  Review guidelines for sex with heart disease.   CARDIAC REHAB PHASE II EXERCISE from 12/17/2016 in [facility] CARDIAC REHAB  Date  12/15/16  Instruction Review Code  2- meets goals/outcomes       Cardiac Drugs I:  -Group instruction provided by verbal instruction and written materials to support subject.  Instructor reviews cardiac drug classes: antiplatelets, anticoagulants, beta blockers, and statins.  Instructor discusses reasons, side effects, and lifestyle considerations for each drug class.   Cardiac Drugs II:  -Group instruction provided by verbal instruction and written materials to support subject.  Instructor reviews cardiac drug classes: angiotensin converting enzyme inhibitors (ACE-I), angiotensin II receptor blockers (ARBs), nitrates, and calcium channel blockers.  Instructor discusses reasons, side effects, and lifestyle considerations for each drug class.   Anatomy and Physiology of the Circulatory System:  Group verbal and written instruction and models provide basic cardiac anatomy and physiology, with the coronary electrical and arterial systems. Review of: AMI, Angina, Valve disease, Heart Failure, Peripheral Artery Disease, Cardiac Arrhythmia, Pacemakers, and the ICD.   Other Education:  -Group or individual verbal, written, or video instructions that support the educational goals of the cardiac rehab program.   Knowledge Questionnaire Score:     Knowledge Questionnaire Score - 12/07/16 1150      Knowledge Questionnaire Score   Pre Score 13/24      Core Components/Risk Factors/Patient Goals at Admission:     Personal Goals and Risk Factors at Admission - 12/07/16 1158      Core Components/Risk Factors/Patient Goals on Admission   Diabetes Yes   Intervention Provide education about signs/symptoms and action to take for hypo/hyperglycemia.;Provide education about proper nutrition, including hydration,  and aerobic/resistive exercise prescription along with prescribed medications to achieve blood glucose in normal ranges: Fasting glucose 65-99 mg/dL   Expected Outcomes Short Term: Participant verbalizes understanding of the signs/symptoms and immediate  care of hyper/hypoglycemia, proper foot care and importance of medication, aerobic/resistive exercise and nutrition plan for blood glucose control.;Long Term: Attainment of HbA1C < 7%.   Hypertension Yes   Intervention Provide education on lifestyle modifcations including regular physical activity/exercise, weight management, moderate sodium restriction and increased consumption of fresh fruit, vegetables, and low fat dairy, alcohol moderation, and smoking cessation.;Monitor prescription use compliance.   Expected Outcomes Short Term: Continued assessment and intervention until BP is < 140/14mm HG in hypertensive participants. < 130/13mm HG in hypertensive participants with diabetes, heart failure or chronic kidney disease.;Long Term: Maintenance of blood pressure at goal levels.   Lipids Yes   Intervention Provide education and support for participant on nutrition & aerobic/resistive exercise along with prescribed medications to achieve LDL 70mg , HDL >40mg .   Expected Outcomes Short Term: Participant states understanding of desired cholesterol values and is compliant with medications prescribed. Participant is following exercise prescription and nutrition guidelines.;Long Term: Cholesterol controlled with medications as prescribed, with individualized exercise RX and with personalized nutrition plan. Value goals: LDL < , HDL > 40 mg.      Core Components/Risk Factors/Patient Goals Review:      Goals and Risk Factor Review    Row Name 12/22/16 1624             Core Components/Risk Factors/Patient Goals Review   Personal Goals Review Weight Management/Obesity;Diabetes;Hypertension;Lipids       Review pt with multiple RF demonstrates eagerness to participate in exercise, nutrition and education opportunities.       Expected Outcomes pt will participate in CR exercise, nutrition, and lifestyle modifications to decrease overall RF.           Core Components/Risk Factors/Patient Goals at  Discharge (Final Review):      Goals and Risk Factor Review - 12/22/16 1624      Core Components/Risk Factors/Patient Goals Review   Personal Goals Review Weight Management/Obesity;Diabetes;Hypertension;Lipids   Review pt with multiple RF demonstrates eagerness to participate in exercise, nutrition and education opportunities.   Expected Outcomes pt will participate in CR exercise, nutrition, and lifestyle modifications to decrease overall RF.       ITP Comments:     ITP Comments    Row Name 12/07/16 0908 12/22/16 1623         ITP Comments Dr. Armanda Magic, Medical Director  Dr. Armanda Magic, Medical Director          Comments: Pt is making expected progress toward personal goals after completing 4 sessions. Recommend continued exercise and life style modification education including  stress management and relaxation techniques to decrease cardiac risk profile.

## 2016-12-24 ENCOUNTER — Encounter (HOSPITAL_COMMUNITY)
Admission: RE | Admit: 2016-12-24 | Discharge: 2016-12-24 | Disposition: A | Discharge status: Home / Self Care | Payer: Medicare Other | Source: Ambulatory Visit | Attending: Cardiovascular Disease | Admitting: Cardiovascular Disease

## 2016-12-24 DIAGNOSIS — Z952 Presence of prosthetic heart valve: Secondary | ICD-10-CM

## 2016-12-24 DIAGNOSIS — I10 Essential (primary) hypertension: Secondary | ICD-10-CM | POA: Diagnosis not present

## 2016-12-24 DIAGNOSIS — I350 Nonrheumatic aortic (valve) stenosis: Secondary | ICD-10-CM | POA: Diagnosis not present

## 2016-12-24 DIAGNOSIS — Z794 Long term (current) use of insulin: Secondary | ICD-10-CM | POA: Diagnosis not present

## 2016-12-24 DIAGNOSIS — Z87891 Personal history of nicotine dependence: Secondary | ICD-10-CM | POA: Diagnosis not present

## 2016-12-24 DIAGNOSIS — E119 Type 2 diabetes mellitus without complications: Secondary | ICD-10-CM | POA: Diagnosis not present

## 2016-12-24 LAB — GLUCOSE, CAPILLARY: GLUCOSE-CAPILLARY: 187 mg/dL — AB (ref 65–99)

## 2016-12-27 ENCOUNTER — Encounter (HOSPITAL_COMMUNITY)
Admission: RE | Admit: 2016-12-27 | Discharge: 2016-12-27 | Disposition: A | Discharge status: Home / Self Care | Payer: Medicare Other | Source: Ambulatory Visit | Attending: Cardiovascular Disease | Admitting: Cardiovascular Disease

## 2016-12-27 DIAGNOSIS — Z952 Presence of prosthetic heart valve: Secondary | ICD-10-CM

## 2016-12-29 ENCOUNTER — Encounter (HOSPITAL_COMMUNITY): Payer: Medicare Other

## 2016-12-29 ENCOUNTER — Telehealth (HOSPITAL_COMMUNITY): Payer: Self-pay | Admitting: Cardiology

## 2016-12-31 ENCOUNTER — Encounter (HOSPITAL_COMMUNITY): Payer: Medicare Other

## 2017-01-03 ENCOUNTER — Encounter (HOSPITAL_COMMUNITY): Payer: Medicare Other

## 2017-01-03 ENCOUNTER — Encounter (HOSPITAL_COMMUNITY)
Admission: RE | Admit: 2017-01-03 | Discharge: 2017-01-03 | Disposition: A | Discharge status: Home / Self Care | Payer: Medicare Other | Source: Ambulatory Visit | Attending: Cardiovascular Disease | Admitting: Cardiovascular Disease

## 2017-01-03 ENCOUNTER — Telehealth (HOSPITAL_COMMUNITY): Payer: Self-pay | Admitting: Cardiology

## 2017-01-03 DIAGNOSIS — Z87891 Personal history of nicotine dependence: Secondary | ICD-10-CM | POA: Diagnosis not present

## 2017-01-03 DIAGNOSIS — Z952 Presence of prosthetic heart valve: Secondary | ICD-10-CM

## 2017-01-03 DIAGNOSIS — Z794 Long term (current) use of insulin: Secondary | ICD-10-CM | POA: Diagnosis not present

## 2017-01-03 DIAGNOSIS — I350 Nonrheumatic aortic (valve) stenosis: Secondary | ICD-10-CM | POA: Diagnosis not present

## 2017-01-03 DIAGNOSIS — I10 Essential (primary) hypertension: Secondary | ICD-10-CM | POA: Diagnosis not present

## 2017-01-03 DIAGNOSIS — E119 Type 2 diabetes mellitus without complications: Secondary | ICD-10-CM | POA: Diagnosis not present

## 2017-01-03 NOTE — Progress Notes (Signed)
Pt transferred to 11:15 class due to family obligations. Vernon Ruiz, BSN Cardiac and Emergency planning/management officer

## 2017-01-05 ENCOUNTER — Encounter (HOSPITAL_COMMUNITY): Payer: Medicare Other

## 2017-01-07 ENCOUNTER — Encounter (HOSPITAL_COMMUNITY): Payer: Medicare Other

## 2017-01-10 ENCOUNTER — Encounter (HOSPITAL_COMMUNITY): Payer: Medicare Other

## 2017-01-12 ENCOUNTER — Encounter (HOSPITAL_COMMUNITY): Payer: Medicare Other

## 2017-01-14 ENCOUNTER — Encounter (HOSPITAL_COMMUNITY): Payer: Medicare Other

## 2017-01-16 ENCOUNTER — Encounter (HOSPITAL_COMMUNITY): Payer: Self-pay

## 2017-01-16 NOTE — Progress Notes (Signed)
Cardiac Individual Treatment Plan  Patient Details  Name: Vernon Ruiz MRN: [id number] Date of Birth: [birth year] Referring Provider:     CARDIAC REHAB PHASE II ORIENTATION from 12/07/2016 in [facility] CARDIAC [facility]  Referring Provider  [name] MD      Initial Encounter Date:    CARDIAC REHAB PHASE II ORIENTATION from 12/07/2016 in [facility] CARDIAC REHAB  Date  12/07/16  Referring Provider  [name] MD      Visit Diagnosis: 10/04/16 S/P AVR  Patient's Home Medications on Admission:  Current Outpatient Prescriptions:  .  acetaminophen (TYLENOL) 325 MG tablet, Take 2 tablets (650 mg total) by mouth every 6 (six) hours as needed for mild pain., Disp: , Rfl:  .  apixaban (ELIQUIS) 5 MG TABS tablet, Take 5 mg by mouth 2 (two) times daily., Disp: , Rfl:  .  aspirin EC 81 MG tablet, Take 1 tablet (81 mg total) by mouth daily., Disp: , Rfl:  .  BYDUREON 2 MG PEN, Inject 2 mg into the skin once a week., Disp: , Rfl:  .  clindamycin (CLEOCIN) 300 MG capsule, Take 2 capsules ( ) by mouth one hour prior to your dental work, Disp: 2 capsule, Rfl: 0 .  Cyanocobalamin (VITAMIN B 12 PO), Take 1 tablet by mouth every other day. , Disp: , Rfl:  .  Dulaglutide 0.75 MG/0.5ML SOPN, Inject 0.75 mg into the skin once a week., Disp: , Rfl:  .  Insulin Glargine (TOUJEO SOLOSTAR Fallon), Inject 50 Units into the skin every evening. , Disp: , Rfl:  .  losartan (COZAAR) 50 MG tablet, Take 1 tablet (50 mg total) by mouth daily., Disp: , Rfl:  .  metFORMIN (GLUCOPHAGE) 1000 MG tablet, Take 1,000 mg by mouth 2 (two) times daily. , Disp: , Rfl: 1 .  metoprolol tartrate (LOPRESSOR) 25 MG tablet, Take 1 tablet (25 mg total) by mouth 2 (two) times daily., Disp: 180 tablet, Rfl: 3  Past Medical History: Past Medical History:  Diagnosis Date  . Aortic stenosis    s/p AVR using a 23mm Edwards Magna-Ease pericardial valve 10/04/16  . Coronary artery disease   .  Diabetes mellitus without complication (HCC)   . GERD (gastroesophageal reflux disease)   . Heart murmur   . Hypertension   . Obstructive sleep apnea    positive from a home test, still needs to get another study done  . Persistent atrial fibrillation (HCC)    s/p clipping of LA appendage 10/04/16    Tobacco Use: History  Smoking Status  . Former Smoker  . Types: Cigars  . Quit date: 09/16/2016  Smokeless Tobacco  . Never Used    Labs: Recent Review Flowsheet Data    Labs for ITP Cardiac and Pulmonary Rehab Latest Ref Rng & Units 10/04/2016 10/04/2016 10/04/2016 10/04/2016 10/05/2016   Hemoglobin A1c 4.8 - 5.6 % - - - - -   PHART 7.350 - 7.450 7.348(L) 7.347(L) 7.302(L) - -   PCO2ART 32.0 - 48.0 mmHg 43.6 42.3 48.5(H) - -   HCO3 20.0 - 28.0 mmol/L 24.1 23.2 23.8 - -   TCO2 0 - 100 mmol/L ACIDBASEDEF 0.0 - 2.0 mmol/L 2.0 2.0 3.0(H) - -   O2SAT % 94.0 95.0 93.0 - -      Capillary Blood Glucose: Lab Results  Component Value Date   GLUCAP 187 (H) 12/24/2016   GLUCAP 161 (H) 12/17/2016   GLUCAP  235 (H) 12/15/2016   GLUCAP 227 (H) 12/15/2016   GLUCAP 204 (H) 10/09/2016     Exercise Target Goals:    Exercise Program Goal: Individual exercise prescription set with THRR, safety & activity barriers. Participant demonstrates ability to understand and report RPE using BORG scale, to self-measure pulse accurately, and to acknowledge the importance of the exercise prescription.  Exercise Prescription Goal: Starting with aerobic activity 30 plus minutes a day, 3 days per week for initial exercise prescription. Provide home exercise prescription and guidelines that participant acknowledges understanding prior to discharge.  Activity Barriers & Risk Stratification:     Activity Barriers & Cardiac Risk Stratification - 12/07/16 0920      Activity Barriers & Cardiac Risk Stratification   Activity Barriers Other (comment);Back Problems   Comments L>R knee issues    Cardiac Risk Stratification High      6 Minute Walk:     6 Minute Walk    Row Name 12/07/16 1151         6 Minute Walk   Phase Initial     Distance 1292 feet     Walk Time 6 minutes     # of Rest Breaks 0     MPH 2.4     METS 2.85     RPE 11     VO2 Peak 9.97     Symptoms Yes (comment)     Comments L knee discomfort     Resting HR 82 bpm     Resting BP 144/80     Resting Oxygen Saturation  97 %     Exercise Oxygen Saturation  during 6 min walk 98 %     Max Ex. HR 105 bpm     Max Ex. BP 152/78     2 Minute Post BP 138/80        Oxygen Initial Assessment:   Oxygen Re-Evaluation:   Oxygen Discharge (Final Oxygen Re-Evaluation):   Initial Exercise Prescription:     Initial Exercise Prescription - 12/07/16 1100      Date of Initial Exercise RX and Referring Provider   Date 12/07/16   Referring Provider [name] MD     Bike   Level 0.7   Minutes 10   METs 2.39     NuStep   Level 3   SPM 80   Minutes 10   METs 2.3     Track   Laps 8   Minutes 10   METs 2.39     Prescription Details   Frequency (times per week) 3   Duration Progress to 30 minutes of continuous aerobic without signs/symptoms of physical distress     Intensity   THRR 40-80% of Max Heartrate 62-124   Ratings of Perceived Exertion 11-13   Perceived Dyspnea 0-4     Progression   Progression Continue to progress workloads to maintain intensity without signs/symptoms of physical distress.     Resistance Training   Training Prescription Yes   Weight 2lbs   Reps 10-15      Perform Capillary Blood Glucose checks as needed.  Exercise Prescription Changes:      Exercise Prescription Changes    Row Name 12/15/16 1647 12/20/16 1600 01/03/17 1609         Response to Exercise   Blood Pressure (Admit) 136/82 130/64 130/80     Blood Pressure (Exercise) 144/80 164/90 138/78     Blood Pressure (Exit) 126/74 122/72 134/84     Heart Rate (Admit)  81 bpm 89 bpm 90 bpm      Heart Rate (Exercise) 101 bpm 104 bpm 103 bpm     Heart Rate (Exit) 71 bpm 88 bpm 90 bpm     Rating of Perceived Exertion (Exercise) Symptoms none none none     Comments pt was oriented to exercise equipment today. Pt responded well to exercise session pt was oriented to exercise equipment today. Pt responded well to exercise session  -     Duration Continue with 30 min of aerobic exercise without signs/symptoms of physical distress. Continue with 30 min of aerobic exercise without signs/symptoms of physical distress. Continue with 30 min of aerobic exercise without signs/symptoms of physical distress.     Intensity THRR unchanged THRR unchanged THRR unchanged       Progression   Progression Continue to progress workloads to maintain intensity without signs/symptoms of physical distress. Continue to progress workloads to maintain intensity without signs/symptoms of physical distress. Continue to progress workloads to maintain intensity without signs/symptoms of physical distress.     Average METs 2.2 2.2 3.3       Resistance Training   Training Prescription Yes Yes Yes     Weight 2lbs 3lbs 4lbs     Reps 10-15 10-15 10-15     Time 10 Minutes 10 Minutes 10 Minutes       Bike   Level 0.7 0.7 1.8     Minutes METs 2.35 2.35 4.48       NuStep   Level SPM 70 80 90     Minutes METs 1.9 2.6 3.3       Track   Laps Minutes METs 2.39 2.74 2.03        Exercise Comments:      Exercise Comments    Row Name 01/18/17 1613           Exercise Comments Pt is doing well in cardiac rehab. Pt last exercise session was on 01/03/17. Pt is busy with work and is limited on availablity with exercising in cardiac rehab.          Exercise Goals and Review:      Exercise Goals    Row Name 12/07/16 0921             Exercise Goals   Increase Physical Activity Yes       Intervention Provide advice, education, support  and counseling about physical activity/exercise needs.;Develop an individualized exercise prescription for aerobic and resistive training based on initial evaluation findings, risk stratification, comorbidities and participant's personal goals.       Expected Outcomes Achievement of increased cardiorespiratory fitness and enhanced flexibility, muscular endurance and strength shown through measurements of functional capacity and personal statement of participant.       Increase Strength and Stamina Yes       Intervention Provide advice, education, support and counseling about physical activity/exercise needs.;Develop an individualized exercise prescription for aerobic and resistive training based on initial evaluation findings, risk stratification, comorbidities and participant's personal goals.       Expected Outcomes Achievement of increased cardiorespiratory fitness and enhanced flexibility, muscular endurance and strength shown through measurements of functional capacity and personal statement of participant.       Able to understand  and use rate of perceived exertion (RPE) scale Yes       Intervention Provide education and explanation on how to use RPE scale       Expected Outcomes Short Term: Able to use RPE daily in rehab to express subjective intensity level;Long Term:  Able to use RPE to guide intensity level when exercising independently       Understanding of Exercise Prescription Yes       Intervention Provide education, explanation, and written materials on patient's individual exercise prescription       Expected Outcomes Short Term: Able to explain program exercise prescription;Long Term: Able to explain home exercise prescription to exercise independently          Exercise Goals Re-Evaluation :     Exercise Goals Re-Evaluation    Row Name 12/21/16 1210 01/18/17 1611           Exercise Goal Re-Evaluation   Exercise Goals Review Increase Physical Activity;Understanding of  Exercise Prescription;Able to understand and use rate of perceived exertion (RPE) scale;Increase Strength and Stamina Increase Physical Activity;Able to understand and use rate of perceived exertion (RPE) scale;Knowledge and understanding of Target Heart Rate Range (THRR);Understanding of Exercise Prescription;Increase Strength and Stamina;Able to check pulse independently      Comments Pt is exercising independently in cardiac rehab. Pt is able to exercise for 30 minutes without difficulty; will continue to monitor. Pt is doing well in cardiac rehab and is averaging 3.3 METs on Nustep machine. Pt also increase handheld weights to 4lbs.      Expected Outcomes Pt will continue to improve in cardiorespiratory fitness and adhere to exercise in cardiac rehab. Pt will continue to improve in cardiorespiratory fitness and adhere to exercise in cardiac rehab.          Discharge Exercise Prescription (Final Exercise Prescription Changes):     Exercise Prescription Changes - 01/03/17 1609      Response to Exercise   Blood Pressure (Admit) 130/80   Blood Pressure (Exercise) 138/78   Blood Pressure (Exit) 134/84   Heart Rate (Admit) 90 bpm   Heart Rate (Exercise) 103 bpm   Heart Rate (Exit) 90 bpm   Rating of Perceived Exertion (Exercise) 12   Symptoms none   Duration Continue with 30 min of aerobic exercise without signs/symptoms of physical distress.   Intensity THRR unchanged     Progression   Progression Continue to progress workloads to maintain intensity without signs/symptoms of physical distress.   Average METs 3.3     Resistance Training   Training Prescription Yes   Weight 4lbs   Reps 10-15   Time 10 Minutes     Bike   Level 1.8   Minutes 10   METs 4.48     NuStep   Level 3   SPM 90   Minutes 10   METs 3.3     Track   Laps 6   Minutes 10   METs 2.03      Nutrition:  Target Goals: Understanding of nutrition guidelines, daily intake of sodium 1500mg , cholesterol  200mg , calories 30% from fat and 7% or less from saturated fats, daily to have 5 or more servings of fruits and vegetables.  Biometrics:     Pre Biometrics - 12/07/16 1157      Pre Biometrics   Waist Circumference 45 inches   Hip Circumference 48 inches   Waist to Hip Ratio 0.94 %   Triceps Skinfold 28 mm   % Body  Fat 36.2 %   Grip Strength 32 kg   Flexibility 9 in   Single Leg Stand 0 seconds       Nutrition Therapy Plan and Nutrition Goals:     Nutrition Therapy & Goals - 12/07/16 1529      Nutrition Therapy   Diet Carb Modified, TLC     Personal Nutrition Goals   Nutrition Goal Pt to identify and limit food sources of saturated fat, trans fat, and sodium   Personal Goal #2 Pt to identify food quantities necessary to achieve weight loss of 6-24 lb (2.7-10.9 kg) at graduation from cardiac rehab. Goal wt loss of 40 lb desired.    Personal Goal #3 CBG concentrations in the normal range or as close to normal as is safely possible.     Intervention Plan   Intervention Prescribe, educate and counsel regarding individualized specific dietary modifications aiming towards targeted core components such as weight, hypertension, lipid management, diabetes, heart failure and other comorbidities.   Expected Outcomes Short Term Goal: Understand basic principles of dietary content, such as calories, fat, sodium, cholesterol and nutrients.;Long Term Goal: Adherence to prescribed nutrition plan.      Nutrition Discharge: Nutrition Scores:     Nutrition Assessments - 12/07/16 1529      MEDFICTS Scores   Pre Score 65      Nutrition Goals Re-Evaluation:   Nutrition Goals Re-Evaluation:   Nutrition Goals Discharge (Final Nutrition Goals Re-Evaluation):   Psychosocial: Target Goals: Acknowledge presence or absence of significant depression and/or stress, maximize coping skills, provide positive support system. Participant is able to verbalize types and ability to use techniques  and skills needed for reducing stress and depression.  Initial Review & Psychosocial Screening:     Initial Psych Review & Screening - 12/07/16 1239      Initial Review   Current issues with None Identified     Family Dynamics   Good Support System? Yes  family   Comments upon brief assessment, no psychosocial needs identified, no interventions necessary      Barriers   Psychosocial barriers to participate in program There are no identifiable barriers or psychosocial needs.     Screening Interventions   Interventions Encouraged to exercise      Quality of Life Scores:     Quality of Life - 12/07/16 1158      Quality of Life Scores   Health/Function Pre 26.8 %   Socioeconomic Pre 29 %   Psych/Spiritual Pre 28.29 %   Family Pre 28.8 %   GLOBAL Pre 27.78 %      PHQ-9: Recent Review Flowsheet Data    Depression screen [facility] 2/9 12/15/2016   Decreased Interest 0   Down, Depressed, Hopeless 0   PHQ - 2 Score 0     Interpretation of Total Score  Total Score Depression Severity:  1-4 = Minimal depression, 5-9 = Mild depression, 10-14 = Moderate depression, 15-19 = Moderately severe depression, 20-27 = Severe depression   Psychosocial Evaluation and Intervention:     Psychosocial Evaluation - 01/16/17 1957      Psychosocial Evaluation & Interventions   Interventions Encouraged to exercise with the program and follow exercise prescription   Comments Pt denies any complaints, stress or depression. Pt feels supported by his family.   Expected Outcomes Pt will demonstrate positive outlook with healthy coping skills.  Pt will contribute meaningfully to his family and community.   Continue Psychosocial Services  Follow up required by staff  Psychosocial Re-Evaluation:     Psychosocial Re-Evaluation    Row Name 12/22/16 1625 01/16/17 1957           Psychosocial Re-Evaluation   Current issues with None Identified None Identified      Comments no psychosocial  needs identified, no interventions necessary  no psychosocial needs identified, no interventions necessary       Expected Outcomes pt will demonstrate positive coping skills and good outlook  pt will demonstrate positive coping skills and good outlook       Interventions Encouraged to attend Cardiac Rehabilitation for the exercise;Stress management education;Relaxation education  -      Continue Psychosocial Services  No Follow up required  -         Psychosocial Discharge (Final Psychosocial Re-Evaluation):     Psychosocial Re-Evaluation - 01/16/17 1957      Psychosocial Re-Evaluation   Current issues with None Identified   Comments no psychosocial needs identified, no interventions necessary    Expected Outcomes pt will demonstrate positive coping skills and good outlook       Vocational Rehabilitation: Provide vocational rehab assistance to qualifying candidates.   Vocational Rehab Evaluation & Intervention:   Education: Education Goals: Education classes will be provided on a weekly basis, covering required topics. Participant will state understanding/return demonstration of topics presented.  Learning Barriers/Preferences:     Learning Barriers/Preferences - 12/07/16 0920      Learning Barriers/Preferences   Learning Barriers Sight   Learning Preferences Skilled Demonstration;Written Material;Pictoral      Education Topics: Count Your Pulse:  -Group instruction provided by verbal instruction, demonstration, patient participation and written materials to support subject.  Instructors address importance of being able to find your pulse and how to count your pulse when at home without a heart monitor.  Patients get hands on experience counting their pulse with staff help and individually.   CARDIAC REHAB PHASE II EXERCISE from 12/24/2016 in [facility] CARDIAC REHAB  Date  12/17/16  Instruction Review Code  2- meets goals/outcomes      Heart Attack,  Angina, and Risk Factor Modification:  -Group instruction provided by verbal instruction, video, and written materials to support subject.  Instructors address signs and symptoms of angina and heart attacks.    Also discuss risk factors for heart disease and how to make changes to improve heart health risk factors.   Functional Fitness:  -Group instruction provided by verbal instruction, demonstration, patient participation, and written materials to support subject.  Instructors address safety measures for doing things around the house.  Discuss how to get up and down off the floor, how to pick things up properly, how to safely get out of a chair without assistance, and balance training.   Meditation and Mindfulness:  -Group instruction provided by verbal instruction, patient participation, and written materials to support subject.  Instructor addresses importance of mindfulness and meditation practice to help reduce stress and improve awareness.  Instructor also leads participants through a meditation exercise.    Stretching for Flexibility and Mobility:  -Group instruction provided by verbal instruction, patient participation, and written materials to support subject.  Instructors lead participants through series of stretches that are designed to increase flexibility thus improving mobility.  These stretches are additional exercise for major muscle groups that are typically performed during regular warm up and cool down.   Hands Only CPR:  -Group verbal, video, and participation provides a basic overview of AHA guidelines for community CPR. Role-play  of emergencies allow participants the opportunity to practice calling for help and chest compression technique with discussion of AED use.   Hypertension: -Group verbal and written instruction that provides a basic overview of hypertension including the most recent diagnostic guidelines, risk factor reduction with self-care instructions and  medication management.    Nutrition I class: Heart Healthy Eating:  -Group instruction provided by PowerPoint slides, verbal discussion, and written materials to support subject matter. The instructor gives an explanation and review of the Therapeutic Lifestyle Changes diet recommendations, which includes a discussion on lipid goals, dietary fat, sodium, fiber, plant stanol/sterol esters, sugar, and the components of a well-balanced, healthy diet.   CARDIAC REHAB PHASE II EXERCISE from 12/24/2016 in [facility] CARDIAC REHAB  Date  12/14/16  Educator  RD  Instruction Review Code  2- meets goals/outcomes      Nutrition II class: Lifestyle Skills:  -Group instruction provided by PowerPoint slides, verbal discussion, and written materials to support subject matter. The instructor gives an explanation and review of label reading, grocery shopping for heart health, heart healthy recipe modifications, and ways to make healthier choices when eating out.   Diabetes Question & Answer:  -Group instruction provided by PowerPoint slides, verbal discussion, and written materials to support subject matter. The instructor gives an explanation and review of diabetes co-morbidities, pre- and post-prandial blood glucose goals, pre-exercise blood glucose goals, signs, symptoms, and treatment of hypoglycemia and hyperglycemia, and foot care basics.   CARDIAC REHAB PHASE II EXERCISE from 12/24/2016 in [facility] CARDIAC REHAB  Date  12/24/16  Educator  RD  Instruction Review Code  2- meets goals/outcomes      Diabetes Blitz:  -Group instruction provided by PowerPoint slides, verbal discussion, and written materials to support subject matter. The instructor gives an explanation and review of the physiology behind type 1 and type 2 diabetes, diabetes medications and rational behind using different medications, pre- and post-prandial blood glucose recommendations and Hemoglobin  A1c goals, diabetes diet, and exercise including blood glucose guidelines for exercising safely.    Portion Distortion:  -Group instruction provided by PowerPoint slides, verbal discussion, written materials, and food models to support subject matter. The instructor gives an explanation of serving size versus portion size, changes in portions sizes over the last 20 years, and what consists of a serving from each food group.   Stress Management:  -Group instruction provided by verbal instruction, video, and written materials to support subject matter.  Instructors review role of stress in heart disease and how to cope with stress positively.     Exercising on Your Own:  -Group instruction provided by verbal instruction, power point, and written materials to support subject.  Instructors discuss benefits of exercise, components of exercise, frequency and intensity of exercise, and end points for exercise.  Also discuss use of nitroglycerin and activating EMS.  Review options of places to exercise outside of rehab.  Review guidelines for sex with heart disease.   CARDIAC REHAB PHASE II EXERCISE from 12/24/2016 in [facility] CARDIAC REHAB  Date  12/15/16  Instruction Review Code  2- meets goals/outcomes      Cardiac Drugs I:  -Group instruction provided by verbal instruction and written materials to support subject.  Instructor reviews cardiac drug classes: antiplatelets, anticoagulants, beta blockers, and statins.  Instructor discusses reasons, side effects, and lifestyle considerations for each drug class.   Cardiac Drugs II:  -Group instruction provided by verbal instruction and written  materials to support subject.  Instructor reviews cardiac drug classes: angiotensin converting enzyme inhibitors (ACE-I), angiotensin II receptor blockers (ARBs), nitrates, and calcium channel blockers.  Instructor discusses reasons, side effects, and lifestyle considerations for each drug  class.   Anatomy and Physiology of the Circulatory System:  Group verbal and written instruction and models provide basic cardiac anatomy and physiology, with the coronary electrical and arterial systems. Review of: AMI, Angina, Valve disease, Heart Failure, Peripheral Artery Disease, Cardiac Arrhythmia, Pacemakers, and the ICD.   Other Education:  -Group or individual verbal, written, or video instructions that support the educational goals of the cardiac rehab program.   Knowledge Questionnaire Score:     Knowledge Questionnaire Score - 12/07/16 1150      Knowledge Questionnaire Score   Pre Score 13/24      Core Components/Risk Factors/Patient Goals at Admission:     Personal Goals and Risk Factors at Admission - 12/07/16 1158      Core Components/Risk Factors/Patient Goals on Admission   Diabetes Yes   Intervention Provide education about signs/symptoms and action to take for hypo/hyperglycemia.;Provide education about proper nutrition, including hydration, and aerobic/resistive exercise prescription along with prescribed medications to achieve blood glucose in normal ranges: Fasting glucose 65-99 mg/dL   Expected Outcomes Short Term: Participant verbalizes understanding of the signs/symptoms and immediate care of hyper/hypoglycemia, proper foot care and importance of medication, aerobic/resistive exercise and nutrition plan for blood glucose control.;Long Term: Attainment of HbA1C < 7%.   Hypertension Yes   Intervention Provide education on lifestyle modifcations including regular physical activity/exercise, weight management, moderate sodium restriction and increased consumption of fresh fruit, vegetables, and low fat dairy, alcohol moderation, and smoking cessation.;Monitor prescription use compliance.   Expected Outcomes Short Term: Continued assessment and intervention until BP is < 140/70mm HG in hypertensive participants. < 130/7mm HG in hypertensive participants with  diabetes, heart failure or chronic kidney disease.;Long Term: Maintenance of blood pressure at goal levels.   Lipids Yes   Intervention Provide education and support for participant on nutrition & aerobic/resistive exercise along with prescribed medications to achieve LDL 70mg , HDL >40mg .   Expected Outcomes Short Term: Participant states understanding of desired cholesterol values and is compliant with medications prescribed. Participant is following exercise prescription and nutrition guidelines.;Long Term: Cholesterol controlled with medications as prescribed, with individualized exercise RX and with personalized nutrition plan. Value goals: LDL < 70mg , HDL > 40 mg.      Core Components/Risk Factors/Patient Goals Review:      Goals and Risk Factor Review    Row Name 12/22/16 1624 01/16/17 1955           Core Components/Risk Factors/Patient Goals Review   Personal Goals Review Weight Management/Obesity;Diabetes;Hypertension;Lipids  -      Review pt with multiple RF demonstrates eagerness to participate in exercise, nutrition and education opportunities. pt with multiple Risk Factors encouraged to attend on a more consistent basis  in exercise, nutrition and education opportunities.      Expected Outcomes pt will participate in CR exercise, nutrition, and lifestyle modifications to decrease overall RF.  pt will participate in Cardiac Rehab exercise, nutrition, and lifestyle modifications to decrease overall Risk Factors.          Core Components/Risk Factors/Patient Goals at Discharge (Final Review):      Goals and Risk Factor Review - 01/16/17 1955      Core Components/Risk Factors/Patient Goals Review   Review pt with multiple Risk Factors encouraged to attend on a  more consistent basis  in exercise, nutrition and education opportunities.   Expected Outcomes pt will participate in Cardiac Rehab exercise, nutrition, and lifestyle modifications to decrease overall Risk Factors.        ITP Comments:     ITP Comments    Row Name 12/07/16 0908 12/22/16 1623 01/18/17 1620       ITP Comments Dr. Armanda Magic, Medical Director  Dr. Armanda Magic, Medical Director  Dr. Armanda Magic, Medical Director         Comments:  Nedra Hai is off to a good start toward personal goals after completing 6 sessions.Psychosocial Assessment reveal no psychosocial needs.  Pt feels supported in his rehab and strives to attend more consistently. Pt is hopeful that moving to a different time will work for his schedule.  Recommend continued exercise and life style modification education including  stress management and relaxation techniques to decrease cardiac risk profile. Alanson Aly, BSN Cardiac and Emergency planning/management officer

## 2017-01-17 ENCOUNTER — Encounter (HOSPITAL_COMMUNITY): Payer: Medicare Other

## 2017-01-19 ENCOUNTER — Encounter (HOSPITAL_COMMUNITY): Payer: Medicare Other

## 2017-01-21 ENCOUNTER — Encounter (HOSPITAL_COMMUNITY): Payer: Medicare Other

## 2017-01-24 ENCOUNTER — Encounter (HOSPITAL_COMMUNITY): Payer: Medicare Other

## 2017-01-25 DIAGNOSIS — I350 Nonrheumatic aortic (valve) stenosis: Secondary | ICD-10-CM | POA: Diagnosis not present

## 2017-01-25 DIAGNOSIS — Z952 Presence of prosthetic heart valve: Secondary | ICD-10-CM | POA: Diagnosis not present

## 2017-01-25 DIAGNOSIS — I480 Paroxysmal atrial fibrillation: Secondary | ICD-10-CM | POA: Diagnosis not present

## 2017-01-25 DIAGNOSIS — I10 Essential (primary) hypertension: Secondary | ICD-10-CM | POA: Diagnosis not present

## 2017-01-25 DIAGNOSIS — E119 Type 2 diabetes mellitus without complications: Secondary | ICD-10-CM | POA: Diagnosis not present

## 2017-01-25 DIAGNOSIS — E785 Hyperlipidemia, unspecified: Secondary | ICD-10-CM | POA: Diagnosis not present

## 2017-01-26 ENCOUNTER — Encounter (HOSPITAL_COMMUNITY): Payer: Medicare Other

## 2017-01-26 DIAGNOSIS — E1165 Type 2 diabetes mellitus with hyperglycemia: Secondary | ICD-10-CM | POA: Diagnosis not present

## 2017-01-28 ENCOUNTER — Encounter (HOSPITAL_COMMUNITY): Payer: Medicare Other

## 2017-01-31 ENCOUNTER — Encounter (HOSPITAL_COMMUNITY): Payer: Medicare Other

## 2017-02-02 ENCOUNTER — Encounter (HOSPITAL_COMMUNITY): Payer: Medicare Other

## 2017-02-03 DIAGNOSIS — E1165 Type 2 diabetes mellitus with hyperglycemia: Secondary | ICD-10-CM | POA: Diagnosis not present

## 2017-02-04 ENCOUNTER — Telehealth (HOSPITAL_COMMUNITY): Payer: Self-pay | Admitting: *Deleted

## 2017-02-04 ENCOUNTER — Encounter (HOSPITAL_COMMUNITY): Payer: Medicare Other

## 2017-02-04 ENCOUNTER — Encounter (HOSPITAL_COMMUNITY): Admission: RE | Admit: 2017-02-04 | Payer: Medicare Other | Source: Ambulatory Visit

## 2017-02-04 NOTE — Telephone Encounter (Signed)
Pt absent from cardiac rehab for extended period of time.  Pt last exercise session was on 9/24.  Pt has completed 6 sessions starting on 12/07/16.  Will discharge pt from the exercise program due to non attendance. Alanson Aly, BSN Cardiac and Emergency planning/management officer

## 2017-02-07 ENCOUNTER — Encounter (HOSPITAL_COMMUNITY): Payer: Medicare Other

## 2017-02-08 NOTE — Addendum Note (Signed)
Encounter addended by: Warrick Parisian D on: 02/08/2017 12:28 PM<BR>    Actions taken: Visit Navigator Flowsheet section accepted

## 2017-02-09 ENCOUNTER — Encounter (HOSPITAL_COMMUNITY): Payer: Medicare Other

## 2017-02-10 DIAGNOSIS — E1165 Type 2 diabetes mellitus with hyperglycemia: Secondary | ICD-10-CM | POA: Diagnosis not present

## 2017-02-11 ENCOUNTER — Encounter (HOSPITAL_COMMUNITY): Payer: Medicare Other

## 2017-02-14 ENCOUNTER — Encounter (HOSPITAL_COMMUNITY): Payer: Medicare Other

## 2017-02-16 ENCOUNTER — Encounter (HOSPITAL_COMMUNITY): Payer: Medicare Other

## 2017-02-17 ENCOUNTER — Encounter (HOSPITAL_COMMUNITY): Payer: Self-pay | Admitting: *Deleted

## 2017-02-17 NOTE — Progress Notes (Signed)
Discharge Progress Report  Patient Details  Name: Vernon Ruiz MRN: [id number] Date of Birth: 07/30/1950 Referring Provider:     CARDIAC REHAB [facility]  Referring Provider  Tonny Bollmanooper,, Michael MD       Number of Visits: 6  Reason for Discharge:  Early Exit:  Lack of attendance  Smoking History:  Social History   Tobacco Use  Smoking Status Former Smoker  . Types: Cigars  . Last attempt to quit: 09/16/2016  . Years since quitting: 0.4  Smokeless Tobacco Never Used    Diagnosis:  No diagnosis found.  ADL UCSD:   Initial Exercise Prescription:   Discharge Exercise Prescription (Final Exercise Prescription Changes): Exercise Prescription Changes - 01/03/17 1609      Response to Exercise   Blood Pressure (Admit)  130/80    Blood Pressure (Exercise)  138/78    Blood Pressure (Exit)  134/84    Heart Rate (Admit)  90 bpm    Heart Rate (Exercise)  103 bpm    Heart Rate (Exit)  90 bpm    Rating of Perceived Exertion (Exercise)  12    Symptoms  none    Duration  Continue with 30 min of aerobic exercise without signs/symptoms of physical distress.    Intensity  THRR unchanged      Progression   Progression  Continue to progress workloads to maintain intensity without signs/symptoms of physical distress.    Average METs  3.3      Resistance Training   Training Prescription  Yes    Weight  4lbs    Reps  10-15    Time  10 Minutes      Bike   Level  1.8    Minutes  10    METs  4.48      NuStep   Level  3    SPM  90    Minutes  10    METs  3.3      Track   Laps  6    Minutes  10    METs  2.03       Functional Capacity:   Psychological, QOL, Others - Outcomes: PHQ 2/9: Depression screen PHQ 2/9 12/15/2016  Decreased Interest 0  Down, Depressed, Hopeless 0  PHQ - 2 Score 0    Quality of Life:   Personal Goals: Goals established at orientation with interventions provided to work  toward goal.    Personal Goals Discharge: Goals and Risk Factor Review    Row Name 12/22/16 1624 01/16/17 1955           Core Components/Risk Factors/Patient Goals Review   Personal Goals Review  Weight Management/Obesity;Diabetes;Hypertension;Lipids  -      Review  pt with multiple RF demonstrates eagerness to participate in exercise, nutrition and education opportunities.  pt with multiple Risk Factors encouraged to attend on a more consistent basis  in exercise, nutrition and education opportunities.      Expected Outcomes  pt will participate in CR exercise, nutrition, and lifestyle modifications to decrease overall RF.   pt will participate in Cardiac Rehab exercise, nutrition, and lifestyle modifications to decrease overall Risk Factors.          Exercise Goals and Review:   Nutrition & Weight - Outcomes:    Nutrition:   Nutrition Discharge:   Education Questionnaire Score:   Pt completed 6 exercise sessions from 11/2816-01/03/17. Pt last exercised on 9/26.  Pt discharged from program on 10/26 due to non adherence to the attendance policy.  Pt did not complete any post assessment surveys and evaluation.  Unknown plan for continuing with exercise at home. Alanson Aly, BSN Cardiac and Emergency planning/management officer

## 2017-02-18 ENCOUNTER — Encounter (HOSPITAL_COMMUNITY): Payer: Medicare Other

## 2017-02-21 ENCOUNTER — Encounter (HOSPITAL_COMMUNITY): Payer: Medicare Other

## 2017-02-21 ENCOUNTER — Encounter (HOSPITAL_COMMUNITY): Payer: Self-pay | Admitting: *Deleted

## 2017-02-23 ENCOUNTER — Encounter (HOSPITAL_COMMUNITY): Payer: Medicare Other

## 2017-02-25 ENCOUNTER — Encounter (HOSPITAL_COMMUNITY): Payer: Medicare Other

## 2017-02-28 ENCOUNTER — Encounter (INDEPENDENT_AMBULATORY_CARE_PROVIDER_SITE_OTHER): Payer: Self-pay

## 2017-02-28 ENCOUNTER — Ambulatory Visit (INDEPENDENT_AMBULATORY_CARE_PROVIDER_SITE_OTHER): Payer: Medicare Other | Admitting: Cardiovascular Disease

## 2017-02-28 ENCOUNTER — Encounter (HOSPITAL_COMMUNITY): Payer: Medicare Other

## 2017-02-28 ENCOUNTER — Encounter: Payer: Self-pay | Admitting: Cardiovascular Disease

## 2017-02-28 VITALS — BP 158/76 | HR 76 | Ht 65.0 in | Wt 215.1 lb

## 2017-02-28 DIAGNOSIS — I359 Nonrheumatic aortic valve disorder, unspecified: Secondary | ICD-10-CM | POA: Diagnosis not present

## 2017-02-28 DIAGNOSIS — I480 Paroxysmal atrial fibrillation: Secondary | ICD-10-CM

## 2017-02-28 DIAGNOSIS — I2510 Atherosclerotic heart disease of native coronary artery without angina pectoris: Secondary | ICD-10-CM | POA: Diagnosis not present

## 2017-02-28 DIAGNOSIS — I10 Essential (primary) hypertension: Secondary | ICD-10-CM | POA: Diagnosis not present

## 2017-02-28 MED ORDER — METOPROLOL TARTRATE 25 MG PO TABS: 25.0000 mg | ORAL_TABLET | Freq: Two times a day (BID) | ORAL | 3 refills | Status: DC

## 2017-02-28 MED ORDER — METOPROLOL TARTRATE 50 MG PO TABS: 50.0000 mg | ORAL_TABLET | Freq: Two times a day (BID) | ORAL | 3 refills | Status: DC

## 2017-02-28 NOTE — Progress Notes (Signed)
Cardiology Office Note Date:  02/28/2017   ID:  Vernon Ruiz, DOB 06/12/1950, MRN [id number]  PCP:  Rinaldo Cloud, MD  Cardiologist:  Audelia Hives, MD    Chief Complaint  Patient presents with  . Aortic Valve Stenosis  . Shoulder Pain    History of Present Illness: Vernon Ruiz is a 66 y.o. male who presents for follow-up of aortic valve stenosis now s/p AVR with a 23 mm Magna Ease pericardial tissue valve 10-04-2016.  Other CV problems include HTN, Type II DM, and obstructive sleep apnea.   The patient is here alone today.  He denies any cardiac symptoms.  He specifically denies chest pain, chest pressure, heart palpitations, or shortness of breath.  He notes that his blood pressure has been running higher.  Losartan was recently increased to 100 mg daily.  He continues on metoprolol 25 mg twice daily.  His biggest complaint is bilateral shoulder pain.  He has been having trouble sleeping at night because of pain in his shoulders.  Also has pain when he raises his arms up above his head.  Otherwise he feels he is doing well.   Past Medical History:  Diagnosis Date  . Aortic stenosis    s/p AVR using a 64mm Edwards Magna-Ease pericardial valve 10/04/16  . Coronary artery disease   . Diabetes mellitus without complication (HCC)   . GERD (gastroesophageal reflux disease)   . Heart murmur   . Hypertension   . Obstructive sleep apnea    positive from a home test, still needs to get another study done  . Persistent atrial fibrillation (HCC)    s/p clipping of LA appendage 10/04/16    Past Surgical History:  Procedure Laterality Date  . AORTIC VALVE REPLACEMENT (AVR) N/A 10/04/2016   Performed by [name], MD at [facility] OR  . APPENDECTOMY    . CLIPPING OF ATRIAL APPENDAGE N/A 10/04/2016   Performed by [name], MD at [facility] OR  . COLONOSCOPY    . EYE SURGERY Right    growth removed from eye  . HERNIA REPAIR Bilateral    inguinal  . Right/Left Heart Cath and Coronary  Angiography N/A 03/10/2016   Performed by [name], MD at [facility] INVASIVE CV LAB  . TRANSESOPHAGEAL ECHOCARDIOGRAM (TEE) N/A 10/04/2016   Performed by [name], MD at [facility] OR    Current Outpatient Medications  Medication Sig Dispense Refill  . acetaminophen (TYLENOL) 325 MG tablet Take 2 tablets (650 mg total) by mouth every 6 (six) hours as needed for mild pain.    Marland Kitchen apixaban (ELIQUIS) 5 MG TABS tablet Take 5 mg by mouth 2 (two) times daily.    Marland Kitchen aspirin EC 81 MG tablet Take 1 tablet (81 mg total) by mouth daily.    Marland Kitchen BYDUREON 2 MG PEN Inject 2 mg into the skin once a week.    . clindamycin (CLEOCIN) 300 MG capsule Take 2 capsules (600mg ) by mouth one hour prior to your dental work 2 capsule 0  . Cyanocobalamin (VITAMIN B 12 PO) Take 1 tablet by mouth every other day.     . Insulin Glargine (TOUJEO SOLOSTAR [place]) Inject 60 Units every evening into the skin.     Marland Kitchen losartan (COZAAR) 100 MG tablet Take 50 mg 2 (two) times daily by mouth.  0  . metFORMIN (GLUCOPHAGE) 1000 MG tablet Take 1,000 mg by mouth 2 (two) times daily.   1  . metoprolol tartrate (LOPRESSOR)  50 MG tablet Take 1 tablet (50 mg total) 2 (two) times daily by mouth. 180 tablet 3  . TRULICITY 1.5 MG/0.5ML SOPN Inject 1.5 mg once a week into the skin.  5   No current facility-administered medications for this visit.     Allergies:   Penicillins; Sulfa antibiotics; and Sulfamethoxazole   Social History:  The patient  reports that he quit smoking about 5 months ago. His smoking use included cigars. he has never used smokeless tobacco. He reports that he drinks alcohol. He reports that he does not use drugs.   Family History:  The patient's family history is not on file.   ROS:  Please see the history of present illness.  All other systems are reviewed and negative.   PHYSICAL EXAM: VS:  BP (!) 158/76   Pulse 76   Ht 5\' 5"  (1.651 m)   Wt 215 lb 1.9 oz (97.6 kg)   BMI 35.80 kg/m  , BMI Body mass index is 35.8  kg/m. GEN: Well nourished, well developed, in no acute distress  HEENT: normal  Neck: no JVD, no masses. Bilateral carotid bruits Cardiac: RRR with 2/6 SEM at the RUSB   Respiratory:  clear to auscultation bilaterally, normal work of breathing GI: soft, nontender, nondistended, + BS MS: no deformity or atrophy  Ext: no pretibial edema, pedal pulses 2+= bilaterally Skin: warm and dry, no rash Neuro:  Strength and sensation are intact Psych: euthymic mood, full affect  EKG:  EKG is not ordered today.  Recent Labs: 09/30/2016: ALT 23 10/05/2016: Magnesium 2.1 10/08/2016: BUN 19; Creatinine, Ser 0.92; Hemoglobin 10.6; Platelets 201; Potassium 3.7; Sodium 136   Lipid Panel  No results found for: CHOL, TRIG, HDL, CHOLHDL, VLDL, LDLCALC, LDLDIRECT    Wt Readings from Last 3 Encounters:  02/28/17 215 lb 1.9 oz (97.6 kg)  02/08/17 216 lb 0.8 oz (98 kg)  12/07/16 215 lb 6.2 oz (97.7 kg)     ASSESSMENT AND PLAN: 1.  Aortic valve disease s/p bioprosthetic AVR 09/2016 (23 mm Magna Ease). Understands to follow SBE prophylaxis guidelines. Should have post-op echo study - will order.   2. HTN: BP is above goal on current medical therapy.  Losartan was recently increased.  Will double metoprolol to 50 mg twice daily.  3. Type II DM: followed by PCP. Lifestyle/dietary modification discussed with patient today.  4.  Paroxysmal atrial fibrillation: Appears to be maintaining sinus rhythm.  Anticoagulated with apixaban.  He has undergone LAA clipping at the time of aortic valve replacement.  Current medicines are reviewed with the patient today.  The patient does not have concerns regarding medicines.  Labs/ tests ordered today include:   Orders Placed This Encounter  Procedures  . ECHOCARDIOGRAM COMPLETE    Disposition:   FU one year  Signed, Audelia HivesMichale Nezzie Manera, MD  02/28/2017 1:26 PM    [facility] [address], [place], [place]  [address] Phone: [phone]; Fax:  [phone]

## 2017-02-28 NOTE — Patient Instructions (Signed)
Medication Instructions:  1) INCREASE METOPROLOL to 50 mg twice daily  Labwork: None  Testing/Procedures: Your provider has requested that you have an echocardiogram. Echocardiography is a painless test that uses sound waves to create images of your heart. It provides your doctor with information about the size and shape of your heart and how well your heart's chambers and valves are working. This procedure takes approximately one hour. There are no restrictions for this procedure.  Follow-Up: Your provider wants you to follow-up in: 1 year with Dr. Excell Seltzer. You will receive a reminder letter in the mail two months in advance. If you don't receive a letter, please call our office to schedule the follow-up appointment.    Any Other Special Instructions Will Be Listed Below (If Applicable).     If you need a refill on your cardiac medications before your next appointment, please call your pharmacy.

## 2017-03-02 ENCOUNTER — Encounter (HOSPITAL_COMMUNITY): Payer: Medicare Other

## 2017-03-04 ENCOUNTER — Encounter (HOSPITAL_COMMUNITY): Payer: Medicare Other

## 2017-03-07 ENCOUNTER — Encounter (HOSPITAL_COMMUNITY): Payer: Medicare Other

## 2017-03-09 ENCOUNTER — Encounter (HOSPITAL_COMMUNITY): Payer: Medicare Other

## 2017-03-11 ENCOUNTER — Ambulatory Visit (HOSPITAL_COMMUNITY): Payer: Medicare Other | Attending: Cardiology

## 2017-03-11 ENCOUNTER — Telehealth: Payer: Self-pay | Admitting: Cardiovascular Disease

## 2017-03-11 ENCOUNTER — Encounter (HOSPITAL_COMMUNITY): Payer: Medicare Other

## 2017-03-11 ENCOUNTER — Other Ambulatory Visit: Payer: Self-pay

## 2017-03-11 DIAGNOSIS — Z9889 Other specified postprocedural states: Secondary | ICD-10-CM | POA: Insufficient documentation

## 2017-03-11 DIAGNOSIS — I4891 Unspecified atrial fibrillation: Secondary | ICD-10-CM | POA: Insufficient documentation

## 2017-03-11 DIAGNOSIS — I359 Nonrheumatic aortic valve disorder, unspecified: Secondary | ICD-10-CM | POA: Diagnosis not present

## 2017-03-11 DIAGNOSIS — M7542 Impingement syndrome of left shoulder: Secondary | ICD-10-CM | POA: Diagnosis not present

## 2017-03-11 DIAGNOSIS — I2510 Atherosclerotic heart disease of native coronary artery without angina pectoris: Secondary | ICD-10-CM | POA: Diagnosis not present

## 2017-03-11 DIAGNOSIS — E119 Type 2 diabetes mellitus without complications: Secondary | ICD-10-CM | POA: Insufficient documentation

## 2017-03-11 DIAGNOSIS — Z48812 Encounter for surgical aftercare following surgery on the circulatory system: Secondary | ICD-10-CM | POA: Diagnosis not present

## 2017-03-11 DIAGNOSIS — Z952 Presence of prosthetic heart valve: Secondary | ICD-10-CM | POA: Insufficient documentation

## 2017-03-11 DIAGNOSIS — I10 Essential (primary) hypertension: Secondary | ICD-10-CM | POA: Diagnosis not present

## 2017-03-11 DIAGNOSIS — G8929 Other chronic pain: Secondary | ICD-10-CM | POA: Diagnosis not present

## 2017-03-11 DIAGNOSIS — M25512 Pain in left shoulder: Secondary | ICD-10-CM | POA: Diagnosis not present

## 2017-03-11 DIAGNOSIS — M7541 Impingement syndrome of right shoulder: Secondary | ICD-10-CM | POA: Diagnosis not present

## 2017-03-11 DIAGNOSIS — M25511 Pain in right shoulder: Secondary | ICD-10-CM | POA: Diagnosis not present

## 2017-03-11 DIAGNOSIS — R011 Cardiac murmur, unspecified: Secondary | ICD-10-CM | POA: Diagnosis not present

## 2017-03-11 NOTE — Telephone Encounter (Signed)
Patient reports his shoulders are still painful - they feel like pins and needles. He states his symptoms have not changed since seeing Dr. Excell Seltzer a couple weeks ago. At that time, he states Dr. Excell Seltzer thought it might be arthritis and to see his orthopaedist for evaluation.  He states he never called and made an appointment. Instructed him to call orthopaedist today and make appointment ASAP. He was grateful for call and agrees with treatment plan.

## 2017-03-11 NOTE — Telephone Encounter (Signed)
New message    Pt is asking for a call. He says that he has some questions he would like to talk with Dr. Excell Seltzer about.

## 2017-03-14 ENCOUNTER — Encounter (HOSPITAL_COMMUNITY): Payer: Medicare Other

## 2017-03-16 ENCOUNTER — Encounter (HOSPITAL_COMMUNITY): Payer: Medicare Other

## 2017-03-18 ENCOUNTER — Encounter (HOSPITAL_COMMUNITY): Payer: Medicare Other

## 2017-03-28 DIAGNOSIS — E1165 Type 2 diabetes mellitus with hyperglycemia: Secondary | ICD-10-CM | POA: Diagnosis not present

## 2017-05-04 DIAGNOSIS — E119 Type 2 diabetes mellitus without complications: Secondary | ICD-10-CM | POA: Diagnosis not present

## 2017-05-04 DIAGNOSIS — Z952 Presence of prosthetic heart valve: Secondary | ICD-10-CM | POA: Diagnosis not present

## 2017-05-04 DIAGNOSIS — I480 Paroxysmal atrial fibrillation: Secondary | ICD-10-CM | POA: Diagnosis not present

## 2017-05-04 DIAGNOSIS — E785 Hyperlipidemia, unspecified: Secondary | ICD-10-CM | POA: Diagnosis not present

## 2017-05-04 DIAGNOSIS — I350 Nonrheumatic aortic (valve) stenosis: Secondary | ICD-10-CM | POA: Diagnosis not present

## 2017-05-04 DIAGNOSIS — I10 Essential (primary) hypertension: Secondary | ICD-10-CM | POA: Diagnosis not present

## 2017-06-08 ENCOUNTER — Telehealth: Payer: Self-pay | Admitting: Cardiovascular Disease

## 2017-06-08 DIAGNOSIS — I10 Essential (primary) hypertension: Secondary | ICD-10-CM | POA: Diagnosis not present

## 2017-06-08 DIAGNOSIS — I480 Paroxysmal atrial fibrillation: Secondary | ICD-10-CM | POA: Diagnosis not present

## 2017-06-08 DIAGNOSIS — E119 Type 2 diabetes mellitus without complications: Secondary | ICD-10-CM | POA: Diagnosis not present

## 2017-06-08 DIAGNOSIS — E785 Hyperlipidemia, unspecified: Secondary | ICD-10-CM | POA: Diagnosis not present

## 2017-06-08 DIAGNOSIS — Z952 Presence of prosthetic heart valve: Secondary | ICD-10-CM | POA: Diagnosis not present

## 2017-06-08 DIAGNOSIS — I350 Nonrheumatic aortic (valve) stenosis: Secondary | ICD-10-CM | POA: Diagnosis not present

## 2017-06-08 NOTE — Telephone Encounter (Signed)
New Message   Patient c/o Palpitations:  High priority if patient c/o lightheadedness, shortness of breath, or chest pain  1) How long have you had palpitations/irregular HR/ Afib? Are you having the symptoms now? Patient states that he experienced flutters and palpitations on 06/07/2017   2) Are you currently experiencing lightheadedness, SOB or CP? No SOB or CP   3) Do you have a history of afib (atrial fibrillation) or irregular heart rhythm? Yes a history of Afib  4) Have you checked your BP or HR? (document readings if available): no   5) Are you experiencing any other symptoms? No

## 2017-06-08 NOTE — Telephone Encounter (Signed)
Patient reports he had an episode that lasted less than 1 second yesterday where it felt like his heart skipped. He has a history of afib and wanted to call for good measure.  He had no other symptoms during the episode and has no symptoms now. Confirmed with the patient he is taking Eliquis and metoprolol as directed and has not missed any doses. He has no VS to report. He states he had much more caffeine yesterday than usual.  He states the skipped beat did not feel like afib when he has had that in the past. Reiterated to the patient the importance of continuing to take Eliquis as instructed. Informed him the skipped beat may have been just that - a PVC or PAC. Encouraged him to decrease caffeine.  He will call if another episode occurs. He was grateful for assistance.

## 2017-06-09 DIAGNOSIS — E785 Hyperlipidemia, unspecified: Secondary | ICD-10-CM | POA: Diagnosis not present

## 2017-06-09 DIAGNOSIS — E119 Type 2 diabetes mellitus without complications: Secondary | ICD-10-CM | POA: Diagnosis not present

## 2017-06-09 DIAGNOSIS — I10 Essential (primary) hypertension: Secondary | ICD-10-CM | POA: Diagnosis not present

## 2017-06-14 DIAGNOSIS — E119 Type 2 diabetes mellitus without complications: Secondary | ICD-10-CM | POA: Diagnosis not present

## 2017-06-14 DIAGNOSIS — I10 Essential (primary) hypertension: Secondary | ICD-10-CM | POA: Diagnosis not present

## 2017-06-14 DIAGNOSIS — E785 Hyperlipidemia, unspecified: Secondary | ICD-10-CM | POA: Diagnosis not present

## 2017-06-14 DIAGNOSIS — I4891 Unspecified atrial fibrillation: Secondary | ICD-10-CM | POA: Diagnosis not present

## 2017-06-20 ENCOUNTER — Telehealth: Payer: Self-pay | Admitting: Cardiovascular Disease

## 2017-06-20 NOTE — Telephone Encounter (Signed)
Patient states he "just hasn't been the same since procedure." He had his AVR 09/2016. He used to have dizzy spells every now and then, but now the episodes are becoming more frequent.  He has never fainted.  He states his BP has been elevated - Dr. Sharyn Lull started a new medication but he can't remember the name at this time. He says since started the new med, his BP runs ins the 120s/70s range. Patient feels "fine" currently.  Instructed patient to stay hydrated and offered appointment. Patient only wants to see Dr. Excell Seltzer. Scheduled patient with Dr. Excell Seltzer 3/21. He understands to call if symptoms reoccur and to proceed to ER if he has a syncopal episode.  He was grateful for call and agrees with treatment plan.

## 2017-06-20 NOTE — Telephone Encounter (Signed)
New Message:   STAT if patient feels like he/she is going to faint   1) Are you dizzy now? No  2) Do you feel faint or have you passed out? No  3) Do you have any other symptoms? No  4) Have you checked your HR and BP (record if available)? No  S[address]

## 2017-06-24 DIAGNOSIS — M7541 Impingement syndrome of right shoulder: Secondary | ICD-10-CM | POA: Diagnosis not present

## 2017-06-24 DIAGNOSIS — M7542 Impingement syndrome of left shoulder: Secondary | ICD-10-CM | POA: Diagnosis not present

## 2017-06-30 ENCOUNTER — Ambulatory Visit (INDEPENDENT_AMBULATORY_CARE_PROVIDER_SITE_OTHER): Payer: Medicare Other | Admitting: Cardiovascular Disease

## 2017-06-30 ENCOUNTER — Encounter: Payer: Self-pay | Admitting: Cardiovascular Disease

## 2017-06-30 VITALS — BP 98/62 | HR 66 | Ht 65.0 in | Wt 214.0 lb

## 2017-06-30 DIAGNOSIS — I952 Hypotension due to drugs: Secondary | ICD-10-CM | POA: Diagnosis not present

## 2017-06-30 DIAGNOSIS — I480 Paroxysmal atrial fibrillation: Secondary | ICD-10-CM

## 2017-06-30 DIAGNOSIS — I10 Essential (primary) hypertension: Secondary | ICD-10-CM

## 2017-06-30 DIAGNOSIS — I359 Nonrheumatic aortic valve disorder, unspecified: Secondary | ICD-10-CM

## 2017-06-30 MED ORDER — LOSARTAN POTASSIUM 50 MG PO TABS: 50.0000 mg | ORAL_TABLET | Freq: Every day | ORAL | 3 refills | Status: DC

## 2017-06-30 MED ORDER — METOPROLOL TARTRATE 25 MG PO TABS: 25.0000 mg | ORAL_TABLET | Freq: Two times a day (BID) | ORAL | 3 refills | Status: DC

## 2017-06-30 MED ORDER — SPIRONOLACTONE 25 MG PO TABS: 12.5000 mg | ORAL_TABLET | Freq: Every day | ORAL | 3 refills | Status: DC

## 2017-06-30 NOTE — Progress Notes (Signed)
Cardiology Office Note Date:  07/01/2017   ID:  YASSEN KINNETT, DOB 1950/12/11, MRN [id number]  PCP:  Rinaldo Cloud, MD  Cardiologist:  [name], MD    Chief Complaint  Patient presents with  . Dizziness     History of Present Illness: Vernon Ruiz is a 67 y.o. male who presents for follow-up evaluation.  The patient has been followed for aortic valve stenosis.  He underwent aortic valve replacement in June 2018 with a 23 mm magna ease pericardial tissue valve.  His postoperative course was uncomplicated.  Other cardiovascular problems include hypertension, type 2 diabetes, and obstructive sleep apnea.  The patient has not been feeling well over the last several weeks.  He had some trouble with elevated blood pressures and his antihypertensive regimen was adjusted.  Since that time he has experienced more frequent dizziness, weakness, and generalized fatigue.  He denies shortness of breath or chest pain.  His legs get weak when he walks.  He has not had frank syncope.  States that he has been drinking plenty of fluids.  No blood in his stools or dark stools.  He has had recent lab work which he tells me was within expected limits.   Past Medical History:  Diagnosis Date  . Aortic stenosis    s/p AVR using a 23mm Edwards Magna-Ease pericardial valve 10/04/16  . Coronary artery disease   . Diabetes mellitus without complication (HCC)   . GERD (gastroesophageal reflux disease)   . Heart murmur   . Hypertension   . Obstructive sleep apnea    positive from a home test, still needs to get another study done  . Persistent atrial fibrillation (HCC)    s/p clipping of LA appendage 10/04/16    Past Surgical History:  Procedure Laterality Date  . AORTIC VALVE REPLACEMENT N/A 10/04/2016   Procedure: AORTIC VALVE REPLACEMENT (AVR);  Surgeon: [name], MD;  [facility];  Service: Open Heart Surgery;  Laterality: N/A;  . APPENDECTOMY    . CARDIAC CATHETERIZATION N/A 03/10/2016    Procedure: Right/Left Heart Cath and Coronary Angiography;  Surgeon: [name], MD;  Location: [facility] INVASIVE CV LAB;  Service: Cardiovascular;  Laterality: N/A;  . CLIPPING OF ATRIAL APPENDAGE N/A 10/04/2016   Procedure: CLIPPING OF ATRIAL APPENDAGE;  Surgeon: [name], MD;  Location: [facility] OR;  Service: Open Heart Surgery;  Laterality: N/A;  . COLONOSCOPY    . EYE SURGERY Right    growth removed from eye  . HERNIA REPAIR Bilateral    inguinal  . TEE WITHOUT CARDIOVERSION N/A 10/04/2016   Procedure: TRANSESOPHAGEAL ECHOCARDIOGRAM (TEE);  Surgeon: [name], MD;  Location: [facility] OR;  Service: Open Heart Surgery;  Laterality: N/A;    Current Outpatient Medications  Medication Sig Dispense Refill  . acetaminophen (TYLENOL) 325 MG tablet Take 2 tablets (650 mg total) by mouth every 6 (six) hours as needed for mild pain.    Marland Kitchen apixaban (ELIQUIS) 5 MG TABS tablet Take 5 mg by mouth 2 (two) times daily.    Marland Kitchen aspirin EC 81 MG tablet Take 1 tablet (81 mg total) by mouth daily.    Marland Kitchen BYDUREON 2 MG PEN Inject 2 mg into the skin once a week.    . Cyanocobalamin (VITAMIN B 12 PO) Take 1 tablet by mouth every other day.     . Insulin Glargine (TOUJEO SOLOSTAR [place]) Inject 60 Units every evening into the skin.     Marland Kitchen losartan (  COZAAR) 50 MG tablet Take 1 tablet (50 mg total) by mouth daily. 90 tablet 3  . metFORMIN (GLUCOPHAGE) 1000 MG tablet Take 1,000 mg by mouth 2 (two) times daily.   1  . metoprolol tartrate (LOPRESSOR) 25 MG tablet Take 1 tablet (25 mg total) by mouth 2 (two) times daily. 180 tablet 3  . spironolactone (ALDACTONE) 25 MG tablet Take 0.5 tablets (12.5 mg total) by mouth daily. 45 tablet 3  . TRULICITY 1.5 MG/0.5ML SOPN Inject 1.5 mg once a week into the skin.  5   No current facility-administered medications for this visit.     Allergies:   Penicillins; Sulfa antibiotics; and Sulfamethoxazole   Social History:  The patient  reports that he quit smoking about 9 months ago.  His smoking use included cigars. He has never used smokeless tobacco. He reports that he drinks alcohol. He reports that he does not use drugs.   Family History:  The patient's  family history is not on file.    ROS:  Please see the history of present illness.  Otherwise, review of systems is positive for erectile dysfunction, palpitations, fatigue, constipation, nausea.  All other systems are reviewed and negative.    PHYSICAL EXAM: VS:  BP 98/62   Pulse 66   Ht 5\' 5"  (1.651 m)   Wt 214 lb (97.1 kg)   SpO2 93%   BMI 35.61 kg/m  , BMI Body mass index is 35.61 kg/m. GEN: Well nourished, well developed, in no acute distress  HEENT: normal  Neck: no JVD, no masses. No carotid bruits Cardiac: RRR with soft systolic ejection murmur at the right upper sternal border, no diastolic murmur               Respiratory:  clear to auscultation bilaterally, normal work of breathing GI: soft, nontender, nondistended, + BS MS: no deformity or atrophy  Ext: no pretibial edema, pedal pulses 2+= bilaterally Skin: warm and dry, no rash Neuro:  Strength and sensation are intact Psych: euthymic mood, full affect  EKG:  EKG is ordered today. The ekg ordered today shows normal sinus rhythm 66 bpm, within normal limits.  Stable by recent echo and by today's exam.  Recent Labs: 09/30/2016: ALT 23 10/05/2016: Magnesium 2.1 10/08/2016: BUN 19; Creatinine, Ser 0.92; Hemoglobin 10.6; Platelets 201; Potassium 3.7; Sodium 136   Lipid Panel  No results found for: CHOL, TRIG, HDL, CHOLHDL, VLDL, LDLCALC, LDLDIRECT    Wt Readings from Last 3 Encounters:  06/30/17 214 lb (97.1 kg)  02/28/17 215 lb 1.9 oz (97.6 kg)  02/08/17 216 lb 0.8 oz (98 kg)     Cardiac Studies Reviewed: 2D Echo 03-11-2017: Study Conclusions  - Left ventricle: The cavity size was normal. Wall thickness was   increased in a pattern of mild LVH. Systolic function was normal.   The estimated ejection fraction was in the range of 55%  to 60%.   Wall motion was normal; there were no regional wall motion   abnormalities. Doppler parameters are consistent with abnormal   left ventricular relaxation (grade 1 diastolic dysfunction). - Aortic valve: A bioprosthesis was present. - Mitral valve: Calcified annulus.  Impressions:  - Normal LV systolic function; probable mild diastolic dysfunction;   mild LVH; s/p AVR with mean gradient of 18 mmHg and no AI.  ASSESSMENT AND PLAN: 1.  Aortic valve disease status post bioprosthetic aortic valve replacement.  Stable by recent echo and by today's exam.  The patient will follow SBE  prophylaxis guidelines.  2.  Hypertension: The patient's antihypertensive agents were adjusted at the time of his last visit because of elevated blood pressure.  He has had further adjustments with his medicines.  He now appears to have symptomatic hypotension.  I have recommended dose reduction in all of his antihypertensive medicines.  He will reduce Spironolactone to 12.5 mg daily, metoprolol to 25 mg twice daily, and losartan to 50 mg daily.  Will arrange follow-up in our hypertension clinic in 3 weeks.  I asked him to record blood pressures at home.  3.  Paroxysmal atrial fibrillation: Maintaining sinus rhythm.  Anticoagulated with apixaban.  History of left atrial appendage clipping.  4.  Type 2 diabetes: Lifestyle modification discussed.  Followed by his PCP.  5.  Erectile dysfunction: Discussed treatment options.  I think they would be reasonable to prescribe sildenafil.  However, I would like him to have better control of his blood pressure I am concerned about him using sildenafil in the setting of hypotension.  Current medicines are reviewed with the patient today.  The patient does not have concerns regarding medicines.  Labs/ tests ordered today include:  No orders of the defined types were placed in this encounter.   Disposition:   FU 3 weeks and hypertension clinic, 6 months with  me.  Signed, [name], MD  07/01/2017 3:20 PM    [facility] [address] [place], [place], [place]  [address] Phone: [phone]; Fax: [phone]

## 2017-06-30 NOTE — Patient Instructions (Addendum)
Medication Instructions:  1) DECREASE ALDACTONE to 12.5 mg daily 2) DECREASE METOPROLOL to 25 mg twice daily 3) DECREASE COZAAR to 50 mg daily  Labwork: None  Testing/Procedures: None  Follow-Up: You have an appointment On Wednesday, April 17th in the HTN Clinic. Please arrive by 9:15AM.   Your provider wants you to follow-up in: 6 months with Dr. Excell Seltzer. You will receive a reminder letter in the mail two months in advance. If you don't receive a letter, please call our office to schedule the follow-up appointment.    Any Other Special Instructions Will Be Listed Below (If Applicable).     If you need a refill on your cardiac medications before your next appointment, please call your pharmacy.

## 2017-07-01 DIAGNOSIS — E119 Type 2 diabetes mellitus without complications: Secondary | ICD-10-CM | POA: Diagnosis not present

## 2017-07-01 DIAGNOSIS — I4891 Unspecified atrial fibrillation: Secondary | ICD-10-CM | POA: Diagnosis not present

## 2017-07-01 DIAGNOSIS — E785 Hyperlipidemia, unspecified: Secondary | ICD-10-CM | POA: Diagnosis not present

## 2017-07-01 DIAGNOSIS — I10 Essential (primary) hypertension: Secondary | ICD-10-CM | POA: Diagnosis not present

## 2017-07-01 DIAGNOSIS — Z952 Presence of prosthetic heart valve: Secondary | ICD-10-CM | POA: Diagnosis not present

## 2017-07-01 DIAGNOSIS — I350 Nonrheumatic aortic (valve) stenosis: Secondary | ICD-10-CM | POA: Diagnosis not present

## 2017-07-04 DIAGNOSIS — E119 Type 2 diabetes mellitus without complications: Secondary | ICD-10-CM | POA: Diagnosis not present

## 2017-07-04 DIAGNOSIS — I10 Essential (primary) hypertension: Secondary | ICD-10-CM | POA: Diagnosis not present

## 2017-07-04 DIAGNOSIS — I4891 Unspecified atrial fibrillation: Secondary | ICD-10-CM | POA: Diagnosis not present

## 2017-07-04 DIAGNOSIS — I96 Gangrene, not elsewhere classified: Secondary | ICD-10-CM | POA: Diagnosis not present

## 2017-07-04 DIAGNOSIS — E785 Hyperlipidemia, unspecified: Secondary | ICD-10-CM | POA: Diagnosis not present

## 2017-07-04 NOTE — Addendum Note (Signed)
Addended by: Jacqlyn Krauss on: 07/04/2017 11:46 AM   Modules accepted: Orders

## 2017-07-21 DIAGNOSIS — Z23 Encounter for immunization: Secondary | ICD-10-CM | POA: Diagnosis not present

## 2017-07-21 DIAGNOSIS — Z794 Long term (current) use of insulin: Secondary | ICD-10-CM | POA: Diagnosis not present

## 2017-07-21 DIAGNOSIS — Z125 Encounter for screening for malignant neoplasm of prostate: Secondary | ICD-10-CM | POA: Diagnosis not present

## 2017-07-21 DIAGNOSIS — E669 Obesity, unspecified: Secondary | ICD-10-CM | POA: Diagnosis not present

## 2017-07-21 DIAGNOSIS — G4733 Obstructive sleep apnea (adult) (pediatric): Secondary | ICD-10-CM | POA: Diagnosis not present

## 2017-07-21 DIAGNOSIS — E785 Hyperlipidemia, unspecified: Secondary | ICD-10-CM | POA: Diagnosis not present

## 2017-07-21 DIAGNOSIS — Z952 Presence of prosthetic heart valve: Secondary | ICD-10-CM | POA: Diagnosis not present

## 2017-07-21 DIAGNOSIS — R809 Proteinuria, unspecified: Secondary | ICD-10-CM | POA: Diagnosis not present

## 2017-07-21 DIAGNOSIS — I10 Essential (primary) hypertension: Secondary | ICD-10-CM | POA: Diagnosis not present

## 2017-07-21 DIAGNOSIS — Z0000 Encounter for general adult medical examination without abnormal findings: Secondary | ICD-10-CM | POA: Diagnosis not present

## 2017-07-21 DIAGNOSIS — E1169 Type 2 diabetes mellitus with other specified complication: Secondary | ICD-10-CM | POA: Diagnosis not present

## 2017-07-21 DIAGNOSIS — I481 Persistent atrial fibrillation: Secondary | ICD-10-CM | POA: Diagnosis not present

## 2017-07-25 DIAGNOSIS — R809 Proteinuria, unspecified: Secondary | ICD-10-CM | POA: Diagnosis not present

## 2017-07-27 ENCOUNTER — Ambulatory Visit (INDEPENDENT_AMBULATORY_CARE_PROVIDER_SITE_OTHER): Payer: Medicare Other | Admitting: Pharmacist

## 2017-07-27 DIAGNOSIS — E785 Hyperlipidemia, unspecified: Secondary | ICD-10-CM | POA: Diagnosis not present

## 2017-07-27 DIAGNOSIS — E1140 Type 2 diabetes mellitus with diabetic neuropathy, unspecified: Secondary | ICD-10-CM | POA: Diagnosis not present

## 2017-07-27 DIAGNOSIS — I10 Essential (primary) hypertension: Secondary | ICD-10-CM

## 2017-07-27 DIAGNOSIS — E119 Type 2 diabetes mellitus without complications: Secondary | ICD-10-CM | POA: Diagnosis not present

## 2017-07-27 DIAGNOSIS — I4891 Unspecified atrial fibrillation: Secondary | ICD-10-CM | POA: Diagnosis not present

## 2017-07-27 MED ORDER — METOPROLOL TARTRATE 25 MG PO TABS: 25.0000 mg | ORAL_TABLET | Freq: Two times a day (BID) | ORAL | 3 refills | Status: DC

## 2017-07-27 MED ORDER — SPIRONOLACTONE 25 MG PO TABS: 12.5000 mg | ORAL_TABLET | Freq: Every day | ORAL | 3 refills | Status: DC

## 2017-07-27 MED ORDER — LOSARTAN POTASSIUM 50 MG PO TABS: 50.0000 mg | ORAL_TABLET | Freq: Every day | ORAL | 3 refills | Status: DC

## 2017-07-27 NOTE — Progress Notes (Signed)
Patient ID: Vernon Ruiz                 DOB: 1950-05-13                      MRN: [id number]     HPI: Vernon Ruiz is a 67 y.o. male patient of Dr. Excell Seltzer who presents today for hypertension evaluation. PMH significant for aortic valve stenosis.  He underwent aortic valve replacement in June 2018 with a 23 mm magna ease pericardial tissue valve.  His postoperative course was uncomplicated.  Other cardiovascular problems include hypertension, type 2 diabetes, and obstructive sleep apnea. At his last visit with Dr. Excell Seltzer he was having issues with hypotension and all of his medications were reduced to help with this.   He presents today for additional BP management. He states that he has not had any dizziness since decreasing medications. He denies chest pain, SOB, and dizziness. He feels he is doing very well on current regimen. However, he does several times ask about Nancie Neas - he has taken this in the past. He states that he is not current taking, but would like to have some 'on hand' if he should need it.   He has lost part D insurance and will need assistance getting his more expensive medications.    Current HTN meds:  Metoprolol tartrate 25mg  BID Losartan 50mg  daily  Spironolactone 12.5mg  daily   Previously tried: cartia - changed to metoprolol  BP goal: <130/80  Social History: The patient  reports that he quit smoking about 9 months ago. His smoking use included cigars. He has never used smokeless tobacco. He reports that he drinks alcohol. He reports that he does not use drugs.   Diet: Most meals prepared from home. He occasionally adds salt to foods. He drinks water. He drinks 'a lot' of coffee - he drinks all day.   Exercise: walks some - he will try to increase this with weather warming up.   Home BP readings: 130/72-180/85 - he states he avg 130s/80s arm cuff  Wt Readings from Last 3 Encounters:  06/30/17 214 lb (97.1 kg)  02/28/17 215 lb 1.9 oz (97.6 kg)  02/08/17 216 lb  0.8 oz (98 kg)   BP Readings from Last 3 Encounters:  07/27/17 (!) 142/82  06/30/17 98/62  02/28/17 (!) 158/76   Pulse Readings from Last 3 Encounters:  07/27/17 61  06/30/17 66  02/28/17 76    Renal function: CrCl cannot be calculated (Patient's most recent lab result is older than the maximum 21 days allowed.).  Past Medical History:  Diagnosis Date  . Aortic stenosis    s/p AVR using a 17mm Edwards Magna-Ease pericardial valve 10/04/16  . Coronary artery disease   . Diabetes mellitus without complication (HCC)   . GERD (gastroesophageal reflux disease)   . Heart murmur   . Hypertension   . Obstructive sleep apnea    positive from a home test, still needs to get another study done  . Persistent atrial fibrillation (HCC)    s/p clipping of LA appendage 10/04/16    Current Outpatient Medications on File Prior to Visit  Medication Sig Dispense Refill  . acetaminophen (TYLENOL) 325 MG tablet Take 2 tablets (650 mg total) by mouth every 6 (six) hours as needed for mild pain.    Marland Kitchen apixaban (ELIQUIS) 5 MG TABS tablet Take 5 mg by mouth 2 (two) times daily.    . Insulin Glargine (TOUJEO  SOLOSTAR [place]) Inject 60 Units every evening into the skin.     . metFORMIN (GLUCOPHAGE) 1000 MG tablet Take 1,000 mg by mouth 2 (two) times daily.   1  . TRULICITY 1.5 MG/0.5ML SOPN Inject 1.5 mg once a week into the skin.  5  . aspirin EC 81 MG tablet Take 1 tablet (81 mg total) by mouth daily. (Patient not taking: Reported on 07/27/2017)    . Cyanocobalamin (VITAMIN B 12 PO) Take 1 tablet by mouth every other day.      No current facility-administered medications on file prior to visit.     Allergies  Allergen Reactions  . Penicillins Itching and Rash    All over body  Has patient had a PCN reaction causing immediate rash, facial/tongue/throat swelling, SOB or lightheadedness with hypotension: yes Has patient had a PCN reaction causing severe rash involving mucus membranes or skin necrosis:  no Has patient had a PCN reaction that required hospitalization no Has patient had a PCN reaction occurring within the last 10 years: no If all of the above answers are "NO", then may proceed with Cephalosporin use.  . Sulfa Antibiotics Rash    Rash  . Sulfamethoxazole Rash    Blood pressure (!) 142/82, pulse 61.   Assessment/Plan: Hypertension: BP today is slightly above goal, but reported home pressure are at goal and pt wishes to continue current regimen. Will not make medications changes today, but have asked he continue to monitor and call if pressures trend up from home measurements. Review discount drug lists for pharmacies and have sent RXs to [facility] to decrease cost. Have also provided him samples and patient assistance paperwork for Eliquis. His current BP medications are all on $10/90day at [facility]. Follow up with Dr. Excell Seltzer as scheduled and HTN clinic as needed.    Thank you, Freddie Apley. Cleatis Polka, PharmD  [facility]  07/28/2017 8:04 AM

## 2017-07-27 NOTE — Patient Instructions (Signed)
CONTINUE all medications as prescribed. Continue to monitor pressures and call with any changes  Follow up as recommended with Dr. Excell Seltzer

## 2017-07-28 ENCOUNTER — Encounter: Payer: Self-pay | Admitting: Pharmacist

## 2017-08-18 DIAGNOSIS — E119 Type 2 diabetes mellitus without complications: Secondary | ICD-10-CM | POA: Diagnosis not present

## 2017-08-18 DIAGNOSIS — H02831 Dermatochalasis of right upper eyelid: Secondary | ICD-10-CM | POA: Diagnosis not present

## 2017-08-18 DIAGNOSIS — Q100 Congenital ptosis: Secondary | ICD-10-CM | POA: Diagnosis not present

## 2017-08-18 DIAGNOSIS — H2513 Age-related nuclear cataract, bilateral: Secondary | ICD-10-CM | POA: Diagnosis not present

## 2017-08-18 DIAGNOSIS — H02834 Dermatochalasis of left upper eyelid: Secondary | ICD-10-CM | POA: Diagnosis not present

## 2017-09-06 DIAGNOSIS — R05 Cough: Secondary | ICD-10-CM | POA: Diagnosis not present

## 2017-09-06 DIAGNOSIS — R112 Nausea with vomiting, unspecified: Secondary | ICD-10-CM | POA: Diagnosis not present

## 2017-09-06 DIAGNOSIS — J309 Allergic rhinitis, unspecified: Secondary | ICD-10-CM | POA: Diagnosis not present

## 2017-09-26 DIAGNOSIS — E119 Type 2 diabetes mellitus without complications: Secondary | ICD-10-CM | POA: Diagnosis not present

## 2017-09-26 DIAGNOSIS — I10 Essential (primary) hypertension: Secondary | ICD-10-CM | POA: Diagnosis not present

## 2017-09-26 DIAGNOSIS — I4891 Unspecified atrial fibrillation: Secondary | ICD-10-CM | POA: Diagnosis not present

## 2017-09-26 DIAGNOSIS — E1140 Type 2 diabetes mellitus with diabetic neuropathy, unspecified: Secondary | ICD-10-CM | POA: Diagnosis not present

## 2017-09-26 DIAGNOSIS — E785 Hyperlipidemia, unspecified: Secondary | ICD-10-CM | POA: Diagnosis not present

## 2017-10-03 DIAGNOSIS — I350 Nonrheumatic aortic (valve) stenosis: Secondary | ICD-10-CM | POA: Diagnosis not present

## 2017-10-03 DIAGNOSIS — I480 Paroxysmal atrial fibrillation: Secondary | ICD-10-CM | POA: Diagnosis not present

## 2017-10-03 DIAGNOSIS — E785 Hyperlipidemia, unspecified: Secondary | ICD-10-CM | POA: Diagnosis not present

## 2017-10-03 DIAGNOSIS — Z952 Presence of prosthetic heart valve: Secondary | ICD-10-CM | POA: Diagnosis not present

## 2017-10-03 DIAGNOSIS — E119 Type 2 diabetes mellitus without complications: Secondary | ICD-10-CM | POA: Diagnosis not present

## 2017-10-03 DIAGNOSIS — I10 Essential (primary) hypertension: Secondary | ICD-10-CM | POA: Diagnosis not present

## 2017-10-24 DIAGNOSIS — E785 Hyperlipidemia, unspecified: Secondary | ICD-10-CM | POA: Diagnosis not present

## 2017-10-24 DIAGNOSIS — E119 Type 2 diabetes mellitus without complications: Secondary | ICD-10-CM | POA: Diagnosis not present

## 2017-10-24 DIAGNOSIS — I10 Essential (primary) hypertension: Secondary | ICD-10-CM | POA: Diagnosis not present

## 2017-10-24 DIAGNOSIS — E1140 Type 2 diabetes mellitus with diabetic neuropathy, unspecified: Secondary | ICD-10-CM | POA: Diagnosis not present

## 2017-10-24 DIAGNOSIS — I4891 Unspecified atrial fibrillation: Secondary | ICD-10-CM | POA: Diagnosis not present

## 2017-11-30 DIAGNOSIS — I10 Essential (primary) hypertension: Secondary | ICD-10-CM | POA: Diagnosis not present

## 2017-11-30 DIAGNOSIS — E785 Hyperlipidemia, unspecified: Secondary | ICD-10-CM | POA: Diagnosis not present

## 2017-11-30 DIAGNOSIS — I4891 Unspecified atrial fibrillation: Secondary | ICD-10-CM | POA: Diagnosis not present

## 2017-11-30 DIAGNOSIS — E119 Type 2 diabetes mellitus without complications: Secondary | ICD-10-CM | POA: Diagnosis not present

## 2017-12-19 ENCOUNTER — Telehealth: Payer: Self-pay | Admitting: Cardiovascular Disease

## 2017-12-19 NOTE — Telephone Encounter (Signed)
Pt calling today because he is going out of the country on 9[place]25 and would like to have his 29mo follow up with Dr Excell Seltzer before he leaves. I advised him I would message Dr Earmon Phoenix RN to see where and/or if he can be worked in. Pt verbalized understanding and had no additional questions.

## 2017-12-19 NOTE — Telephone Encounter (Signed)
New Message         Patient is traveling out of the country and have some safetly questions and would like a call back concerning this matter.

## 2017-12-21 ENCOUNTER — Other Ambulatory Visit: Payer: Self-pay | Admitting: Cardiovascular Disease

## 2017-12-21 ENCOUNTER — Ambulatory Visit (INDEPENDENT_AMBULATORY_CARE_PROVIDER_SITE_OTHER): Payer: Medicare Other | Admitting: Cardiovascular Disease

## 2017-12-21 ENCOUNTER — Encounter: Payer: Self-pay | Admitting: Cardiovascular Disease

## 2017-12-21 VITALS — BP 144/82 | HR 78 | Ht 65.0 in | Wt 209.0 lb

## 2017-12-21 DIAGNOSIS — I480 Paroxysmal atrial fibrillation: Secondary | ICD-10-CM

## 2017-12-21 DIAGNOSIS — E785 Hyperlipidemia, unspecified: Secondary | ICD-10-CM | POA: Diagnosis not present

## 2017-12-21 DIAGNOSIS — E119 Type 2 diabetes mellitus without complications: Secondary | ICD-10-CM | POA: Diagnosis not present

## 2017-12-21 DIAGNOSIS — I359 Nonrheumatic aortic valve disorder, unspecified: Secondary | ICD-10-CM | POA: Diagnosis not present

## 2017-12-21 DIAGNOSIS — E1165 Type 2 diabetes mellitus with hyperglycemia: Secondary | ICD-10-CM | POA: Diagnosis not present

## 2017-12-21 DIAGNOSIS — I10 Essential (primary) hypertension: Secondary | ICD-10-CM | POA: Diagnosis not present

## 2017-12-21 MED ORDER — SILDENAFIL CITRATE 20 MG PO TABS: ORAL_TABLET | ORAL | 3 refills | Status: DC

## 2017-12-21 NOTE — Telephone Encounter (Signed)
Pt's pharmacy is requesting more tablets of sildenafil. Please address

## 2017-12-21 NOTE — Telephone Encounter (Signed)
Scheduled patient for today at 1340. He was grateful for assistance.

## 2017-12-21 NOTE — Patient Instructions (Signed)
Medication Instructions:  1) START SILDENAFIL 20 mg (2-5 tablets) once daily as needed prior to sexual activity.  Labwork: None  Testing/Procedures: Your provider has requested that you have an echocardiogram in 6 months. Echocardiography is a painless test that uses sound waves to create images of your heart. It provides your doctor with information about the size and shape of your heart and how well your heart's chambers and valves are working. This procedure takes approximately one hour. There are no restrictions for this procedure.  Follow-Up: Your provider recommends that you schedule a follow-up appointment in 6 months with Dr. Excell Seltzer. You will be called to arrange this appointment and your echocardiogram.   Any Other Special Instructions Will Be Listed Below (If Applicable).     If you need a refill on your cardiac medications before your next appointment, please call your pharmacy.

## 2017-12-21 NOTE — Progress Notes (Signed)
Cardiology Office Note Date:  12/22/2017   ID:  Vernon Ruiz, DOB 24-Feb-1951, MRN [id number]  PCP:  [name], MD  Cardiologist:  No primary care provider on file.    Chief Complaint  Patient presents with  . Follow-up    aortic valve disease     History of Present Illness: Vernon Ruiz is a 66 y.o. male who presents for follow-up evaluation.  The patient has been followed for aortic stenosis and paroxysmal atrial fibrillation.  He underwent bioprosthetic aortic valve replacement in June 2018 using a 23 mm magna ease pericardial tissue valve.  Comorbid conditions include type 2 diabetes, hypertension, and obstructive sleep apnea.  The patient is here alone today.  He denies chest pain, shortness of breath, chest pressure, leg swelling, orthopnea, PND, or heart palpitations.  He is scheduled to go hunting in [place] in the near future.  He has not been exercising regularly.  He is frustrated with his diabetes and glycemic control.  He has been considering weight loss surgery but would have to go out of the [place] for this because he likely would not meet criteria here.  Past Medical History:  Diagnosis Date  . Aortic stenosis    s/p AVR using a 23mm Edwards Magna-Ease pericardial valve 10/04/16  . Coronary artery disease   . Diabetes mellitus without complication (HCC)   . GERD (gastroesophageal reflux disease)   . Heart murmur   . Hypertension   . Obstructive sleep apnea    positive from a home test, still needs to get another study done  . Persistent atrial fibrillation (HCC)    s/p clipping of LA appendage 10/04/16    Past Surgical History:  Procedure Laterality Date  . AORTIC VALVE REPLACEMENT N/A 10/04/2016   Procedure: AORTIC VALVE REPLACEMENT (AVR);  Surgeon: [name], MD;  Location: [facility] OR;  Service: Open Heart Surgery;  Laterality: N/A;  . APPENDECTOMY    . CARDIAC CATHETERIZATION N/A 03/10/2016   Procedure: Right/Left Heart Cath and Coronary  Angiography;  Surgeon: [name], MD;  Location: [facility] INVASIVE CV LAB;  Service: Cardiovascular;  Laterality: N/A;  . CLIPPING OF ATRIAL APPENDAGE N/A 10/04/2016   Procedure: CLIPPING OF ATRIAL APPENDAGE;  Surgeon: [name], MD;  Location: [facility] OR;  Service: Open Heart Surgery;  Laterality: N/A;  . COLONOSCOPY    . EYE SURGERY Right    growth removed from eye  . HERNIA REPAIR Bilateral    inguinal  . TEE WITHOUT CARDIOVERSION N/A 10/04/2016   Procedure: TRANSESOPHAGEAL ECHOCARDIOGRAM (TEE);  Surgeon: [name], MD;  Location[facility] OR;  Service: Open Heart Surgery;  Laterality: N/A;    Current Outpatient Medications  Medication Sig Dispense Refill  . acetaminophen (TYLENOL) 325 MG tablet Take 2 tablets (650 mg total) by mouth every 6 (six) hours as needed for mild pain.    Marland Kitchen apixaban (ELIQUIS) 5 MG TABS tablet Take 5 mg by mouth 2 (two) times daily.    Marland Kitchen aspirin EC 81 MG tablet Take 1 tablet (81 mg total) by mouth daily.    . Cyanocobalamin (VITAMIN B 12 PO) Take 1 tablet by mouth every other day.     . Insulin Glargine (TOUJEO SOLOSTAR [place]) Inject 60 Units every evening into the skin.     Marland Kitchen losartan (COZAAR) 50 MG tablet Take 1 tablet (50 mg total) by mouth daily. 90 tablet 3  . metFORMIN (GLUCOPHAGE) 1000 MG tablet Take 1,000 mg by mouth 2 (two)  times daily.   1  . metoprolol tartrate (LOPRESSOR) 25 MG tablet Take 1 tablet (25 mg total) by mouth 2 (two) times daily. 180 tablet 3  . Semaglutide (OZEMPIC, 0.25 OR 0.5 MG/DOSE, [place]) Inject into the skin. Use as directed weekly.    Marland Kitchen spironolactone (ALDACTONE) 25 MG tablet Take 0.5 tablets (12.5 mg total) by mouth daily. 45 tablet 3  . sildenafil (REVATIO) 20 MG tablet Take 2-5 tablets (40-100mg ) once daily as needed prior to sexual activity. 12 tablet 3   No current facility-administered medications for this visit.     Allergies:   Penicillins; Sulfa antibiotics; and Sulfamethoxazole   Social History:  The patient  reports that he  quit smoking about 15 months ago. His smoking use included cigars. He has never used smokeless tobacco. He reports that he drinks alcohol. He reports that he does not use drugs.   Family History:  The patient's family history is not on file.    ROS:  Please see the history of present illness.  Otherwise, review of systems is positive for erectile dysfunction, hearing loss.  All other systems are reviewed and negative.    PHYSICAL EXAM: VS:  BP (!) 144/82   Pulse 78   Ht 5\' 5"  (1.651 m)   Wt 209 lb (94.8 kg)   SpO2 97%   BMI 34.78 kg/m  , BMI Body mass index is 34.78 kg/m. GEN: Well nourished, well developed, in no acute distress HEENT: normal Neck: no JVD, no masses. No carotid bruits Cardiac: RRR with 2/6 systolic murmur at the RUSB, no diastolic murmur             Respiratory:  clear to auscultation bilaterally, normal work of breathing GI: soft, nontender, nondistended, + BS MS: no deformity or atrophy Ext: no pretibial edema, pedal pulses 2+= bilaterally Skin: warm and dry, no rash Neuro:  Strength and sensation are intact Psych: euthymic mood, full affect  EKG:  EKG is not ordered today.  Recent Labs: No results found for requested labs within last 8760 hours.   Lipid Panel  No results found for: CHOL, TRIG, HDL, CHOLHDL, VLDL, LDLCALC, LDLDIRECT    Wt Readings from Last 3 Encounters:  12/21/17 209 lb (94.8 kg)  06/30/17 214 lb (97.1 kg)  02/28/17 215 lb 1.9 oz (97.6 kg)     Cardiac Studies Reviewed: Echo 03-11-2017: Study Conclusions  - Left ventricle: The cavity size was normal. Wall thickness was   increased in a pattern of mild LVH. Systolic function was normal.   The estimated ejection fraction was in the range of 55% to 60%.   Wall motion was normal; there were no regional wall motion   abnormalities. Doppler parameters are consistent with abnormal   left ventricular relaxation (grade 1 diastolic dysfunction). - Aortic valve: A bioprosthesis was  present. - Mitral valve: Calcified annulus.  Impressions:  - Normal LV systolic function; probable mild diastolic dysfunction;   mild LVH; s/p AVR with mean gradient of 18 mmHg and no AI.  ASSESSMENT AND PLAN: 1.  Aortic valve disease status post bioprosthetic aortic valve replacement: Most recent echo reviewed as above with a mean transvalvular gradient of 18 mmHg.  I like to see the patient back in 6 months with an echo prior to his visit.  He will continue on apixaban for anticoagulation in the setting of paroxysmal atrial fibrillation.  2.  Paroxysmal atrial fibrillation: The patient is maintaining sinus rhythm.  He is treated with metoprolol and  anticoagulated with apixaban.  3.  Type 2 diabetes: We had a lengthy discussion today.  He is interested in pursuing weight loss surgery with either a gastric bypass or gastric sleeve.  I am not sure that he would qualify in the [place] but he is thinking about going to [place] to have this done.  He is frustrated with his inability to control his diabetes.  We had a lengthy discussion about lifestyle modification with dietary changes and regular exercise.  I think with fairly modest weight loss of 15 to 20 pounds his glycemic control would be significantly improved.  4.  Hypertension: Treated with losartan, spironolactone and metoprolol  5.  Erectile dysfunction: He would like to try ED medication.  We will write him a prescription for sildenafil.  Current medicines are reviewed with the patient today.  The patient does not have concerns regarding medicines.  Labs/ tests ordered today include:   Orders Placed This Encounter  Procedures  . ECHOCARDIOGRAM COMPLETE    Disposition:   FU 6 months with an echocardiogram prior to the office visit.  Signed, [name], MD  12/22/2017 8:30 AM    [facility] [address] [place], [place], [place]  [address] Phone: [phone]; Fax: [phone]

## 2017-12-28 DIAGNOSIS — I10 Essential (primary) hypertension: Secondary | ICD-10-CM | POA: Diagnosis not present

## 2017-12-28 DIAGNOSIS — R809 Proteinuria, unspecified: Secondary | ICD-10-CM | POA: Diagnosis not present

## 2017-12-28 DIAGNOSIS — E119 Type 2 diabetes mellitus without complications: Secondary | ICD-10-CM | POA: Diagnosis not present

## 2017-12-28 DIAGNOSIS — E785 Hyperlipidemia, unspecified: Secondary | ICD-10-CM | POA: Diagnosis not present

## 2017-12-28 DIAGNOSIS — I4891 Unspecified atrial fibrillation: Secondary | ICD-10-CM | POA: Diagnosis not present

## 2018-01-02 ENCOUNTER — Other Ambulatory Visit: Payer: Self-pay | Admitting: Cardiovascular Disease

## 2018-01-02 NOTE — Telephone Encounter (Signed)
Pt's pharmacy is requesting a refill on sildenafil. Would Dr. Cooper like to refill this medication? Please address 

## 2018-01-02 NOTE — Telephone Encounter (Signed)
Pt's pharmacy is requesting a 90 day supply. Please address 

## 2018-01-02 NOTE — Telephone Encounter (Signed)
Yes this is fine.-thx 

## 2018-02-16 DIAGNOSIS — H02831 Dermatochalasis of right upper eyelid: Secondary | ICD-10-CM | POA: Diagnosis not present

## 2018-02-16 DIAGNOSIS — H02834 Dermatochalasis of left upper eyelid: Secondary | ICD-10-CM | POA: Diagnosis not present

## 2018-02-16 DIAGNOSIS — H00024 Hordeolum internum left upper eyelid: Secondary | ICD-10-CM | POA: Diagnosis not present

## 2018-02-16 DIAGNOSIS — H2513 Age-related nuclear cataract, bilateral: Secondary | ICD-10-CM | POA: Diagnosis not present

## 2018-02-16 DIAGNOSIS — H00025 Hordeolum internum left lower eyelid: Secondary | ICD-10-CM | POA: Diagnosis not present

## 2018-02-17 ENCOUNTER — Other Ambulatory Visit: Payer: Self-pay | Admitting: Cardiovascular Disease

## 2018-02-17 DIAGNOSIS — I10 Essential (primary) hypertension: Secondary | ICD-10-CM

## 2018-02-17 MED ORDER — METOPROLOL TARTRATE 25 MG PO TABS: 25.0000 mg | ORAL_TABLET | Freq: Two times a day (BID) | ORAL | 3 refills | Status: DC

## 2018-02-23 DIAGNOSIS — Z952 Presence of prosthetic heart valve: Secondary | ICD-10-CM | POA: Diagnosis not present

## 2018-02-23 DIAGNOSIS — I10 Essential (primary) hypertension: Secondary | ICD-10-CM | POA: Diagnosis not present

## 2018-02-23 DIAGNOSIS — I350 Nonrheumatic aortic (valve) stenosis: Secondary | ICD-10-CM | POA: Diagnosis not present

## 2018-02-23 DIAGNOSIS — E785 Hyperlipidemia, unspecified: Secondary | ICD-10-CM | POA: Diagnosis not present

## 2018-02-23 DIAGNOSIS — I480 Paroxysmal atrial fibrillation: Secondary | ICD-10-CM | POA: Diagnosis not present

## 2018-02-23 DIAGNOSIS — E119 Type 2 diabetes mellitus without complications: Secondary | ICD-10-CM | POA: Diagnosis not present

## 2018-04-24 DIAGNOSIS — H01024 Squamous blepharitis left upper eyelid: Secondary | ICD-10-CM | POA: Diagnosis not present

## 2018-04-24 DIAGNOSIS — H01021 Squamous blepharitis right upper eyelid: Secondary | ICD-10-CM | POA: Diagnosis not present

## 2018-04-24 DIAGNOSIS — H01025 Squamous blepharitis left lower eyelid: Secondary | ICD-10-CM | POA: Diagnosis not present

## 2018-04-24 DIAGNOSIS — H10413 Chronic giant papillary conjunctivitis, bilateral: Secondary | ICD-10-CM | POA: Diagnosis not present

## 2018-04-24 DIAGNOSIS — H01022 Squamous blepharitis right lower eyelid: Secondary | ICD-10-CM | POA: Diagnosis not present

## 2018-05-03 DIAGNOSIS — Z23 Encounter for immunization: Secondary | ICD-10-CM | POA: Diagnosis not present

## 2018-05-03 DIAGNOSIS — I10 Essential (primary) hypertension: Secondary | ICD-10-CM | POA: Diagnosis not present

## 2018-05-03 DIAGNOSIS — E1169 Type 2 diabetes mellitus with other specified complication: Secondary | ICD-10-CM | POA: Diagnosis not present

## 2018-05-03 DIAGNOSIS — Z7984 Long term (current) use of oral hypoglycemic drugs: Secondary | ICD-10-CM | POA: Diagnosis not present

## 2018-05-03 DIAGNOSIS — Z952 Presence of prosthetic heart valve: Secondary | ICD-10-CM | POA: Diagnosis not present

## 2018-05-03 DIAGNOSIS — G4733 Obstructive sleep apnea (adult) (pediatric): Secondary | ICD-10-CM | POA: Diagnosis not present

## 2018-05-03 DIAGNOSIS — Z125 Encounter for screening for malignant neoplasm of prostate: Secondary | ICD-10-CM | POA: Diagnosis not present

## 2018-05-03 DIAGNOSIS — E785 Hyperlipidemia, unspecified: Secondary | ICD-10-CM | POA: Diagnosis not present

## 2018-05-03 DIAGNOSIS — I480 Paroxysmal atrial fibrillation: Secondary | ICD-10-CM | POA: Diagnosis not present

## 2018-05-03 DIAGNOSIS — R809 Proteinuria, unspecified: Secondary | ICD-10-CM | POA: Diagnosis not present

## 2018-05-03 DIAGNOSIS — Z6835 Body mass index (BMI) 35.0-35.9, adult: Secondary | ICD-10-CM | POA: Diagnosis not present

## 2018-05-16 ENCOUNTER — Other Ambulatory Visit: Payer: Self-pay | Admitting: Cardiovascular Disease

## 2018-05-18 DIAGNOSIS — Z6836 Body mass index (BMI) 36.0-36.9, adult: Secondary | ICD-10-CM | POA: Diagnosis not present

## 2018-05-18 DIAGNOSIS — I10 Essential (primary) hypertension: Secondary | ICD-10-CM | POA: Diagnosis not present

## 2018-05-18 DIAGNOSIS — E785 Hyperlipidemia, unspecified: Secondary | ICD-10-CM | POA: Diagnosis not present

## 2018-05-18 DIAGNOSIS — E119 Type 2 diabetes mellitus without complications: Secondary | ICD-10-CM | POA: Diagnosis not present

## 2018-05-18 DIAGNOSIS — R809 Proteinuria, unspecified: Secondary | ICD-10-CM | POA: Diagnosis not present

## 2018-05-18 DIAGNOSIS — I4891 Unspecified atrial fibrillation: Secondary | ICD-10-CM | POA: Diagnosis not present

## 2018-05-24 DIAGNOSIS — I10 Essential (primary) hypertension: Secondary | ICD-10-CM | POA: Diagnosis not present

## 2018-05-24 DIAGNOSIS — I350 Nonrheumatic aortic (valve) stenosis: Secondary | ICD-10-CM | POA: Diagnosis not present

## 2018-05-24 DIAGNOSIS — I480 Paroxysmal atrial fibrillation: Secondary | ICD-10-CM | POA: Diagnosis not present

## 2018-05-24 DIAGNOSIS — E785 Hyperlipidemia, unspecified: Secondary | ICD-10-CM | POA: Diagnosis not present

## 2018-05-24 DIAGNOSIS — E119 Type 2 diabetes mellitus without complications: Secondary | ICD-10-CM | POA: Diagnosis not present

## 2018-05-24 DIAGNOSIS — Z952 Presence of prosthetic heart valve: Secondary | ICD-10-CM | POA: Diagnosis not present

## 2018-06-16 ENCOUNTER — Ambulatory Visit (HOSPITAL_COMMUNITY): Payer: Medicare Other | Attending: Internal Medicine

## 2018-06-16 ENCOUNTER — Encounter (INDEPENDENT_AMBULATORY_CARE_PROVIDER_SITE_OTHER): Payer: Self-pay

## 2018-06-16 DIAGNOSIS — I359 Nonrheumatic aortic valve disorder, unspecified: Secondary | ICD-10-CM | POA: Diagnosis not present

## 2018-06-19 ENCOUNTER — Ambulatory Visit: Payer: Medicare Other | Admitting: Cardiovascular Disease

## 2018-06-20 ENCOUNTER — Telehealth: Payer: Self-pay | Admitting: Cardiovascular Disease

## 2018-06-20 DIAGNOSIS — I10 Essential (primary) hypertension: Secondary | ICD-10-CM

## 2018-06-20 NOTE — Telephone Encounter (Signed)
-----   Message from [name], MD sent at 06/16/2018  5:47 PM EST ----- Echo study reviewed with essentially stable findings.  Normal LV systolic function.  Mildly elevated transvalvular gradients not significantly changed from previous.  Continue clinical follow-up.  Overall looks good.

## 2018-06-20 NOTE — Telephone Encounter (Signed)
Patient returned call for Echo results.  ?

## 2018-06-20 NOTE — Telephone Encounter (Signed)
Reviewed results with patient who verbalized understanding.    While on the phone, he was asked about his dizziness as reported at echo visit.  He states he has had a few episodes, days apart. Last Thursday, he had an episode where he was standing and felt like he was going to fall. He sat down, drank water, and his symptoms subsided. He checked his BP about 1 hour later and it was 177/88.  He checks his BP about once weekly and it ranges from 145-180/75-90. This is since his last visit with Dr. Excell Seltzer.   He has an appointment with Dr. Excell Seltzer 3/25. He understands he will be called with Dr. Earmon Phoenix recommendations. He will stay well hydrated in the meantime.   He was grateful for call.

## 2018-06-20 NOTE — Telephone Encounter (Signed)
° °  Follow up    Please return call to patient with echo results

## 2018-06-22 NOTE — Telephone Encounter (Signed)
24 hour monitor ordered and scheduled 3/17. He was grateful for call and agrees with treatment plan.

## 2018-06-22 NOTE — Telephone Encounter (Signed)
I would recommend a 24 hour BP monitor to be done prior to the visit. Should help [place] figure out what to do with his medications. thanks

## 2018-06-27 ENCOUNTER — Other Ambulatory Visit: Payer: Self-pay

## 2018-06-27 ENCOUNTER — Ambulatory Visit (INDEPENDENT_AMBULATORY_CARE_PROVIDER_SITE_OTHER): Payer: Medicare Other

## 2018-06-27 DIAGNOSIS — I10 Essential (primary) hypertension: Secondary | ICD-10-CM | POA: Diagnosis not present

## 2018-06-28 ENCOUNTER — Telehealth: Payer: Self-pay

## 2018-06-28 NOTE — Telephone Encounter (Signed)
Vernon Ruiz states he is happy to wait for evaluation. He has his monitor on now and has not had any dizziness.   Appointment 3/25 cancelled. He understands he will call to be rescheduled.

## 2018-06-29 ENCOUNTER — Telehealth: Payer: Self-pay

## 2018-06-29 DIAGNOSIS — I10 Essential (primary) hypertension: Secondary | ICD-10-CM

## 2018-06-29 NOTE — Telephone Encounter (Signed)
Received ambulatory BP report results.  Mean BP= 161/84 from 55 samples.  Mean HR=74  Hard copy placed in Dr. Earmon Phoenix box.   To Dr. Excell Seltzer for recommendations.

## 2018-07-02 NOTE — Telephone Encounter (Signed)
Ok will review and make recommendations. He will need additional antihypertensive medication.

## 2018-07-03 NOTE — Telephone Encounter (Signed)
Per Dr. Excell Seltzer, BP consistently above goal. Patient is to: INCREASE ALDACTONE to 25 mg daily INCREASE LOSARTAN to 100 mg  BMET in 2 weeks   Left message to call back.

## 2018-07-05 ENCOUNTER — Ambulatory Visit: Payer: Medicare Other | Admitting: Cardiovascular Disease

## 2018-07-11 MED ORDER — SPIRONOLACTONE 25 MG PO TABS: 25.0000 mg | ORAL_TABLET | Freq: Every day | ORAL | 3 refills | Status: DC

## 2018-07-11 MED ORDER — LOSARTAN POTASSIUM 100 MG PO TABS: 100.0000 mg | ORAL_TABLET | Freq: Every day | ORAL | 3 refills | Status: DC

## 2018-07-11 NOTE — Telephone Encounter (Signed)
Instructed patient to INCREASE ALDACTONE to 25 mg daily. Instructed patient to INCREASE LOSARTAN to 100 mg daily. He understands he will be called to schedule labwork closer to 2 weeks given COVID-19 restrictions. He was grateful for call and agrees with treatment plan.

## 2018-07-25 NOTE — Telephone Encounter (Signed)
Due to COVID restrictions, scheduled patient in August for follow-up visit with Dr. Excell Seltzer. He understands to call prior to that time if he needs anything. He was grateful for assistance.

## 2018-07-25 NOTE — Addendum Note (Signed)
Addended by: Gunnar Fusi A on: 07/25/2018 04:28 PM   Modules accepted: Orders

## 2018-07-25 NOTE — Telephone Encounter (Signed)
Scheduled patient this Friday for BMET.  He denies cough, SOB, fever, and has not been out of the state in 14 days. He understands he will be screened again when he arrives to the office. He was grateful for call and agrees with treatment plan.

## 2018-07-28 ENCOUNTER — Other Ambulatory Visit: Payer: Medicare Other | Admitting: *Deleted

## 2018-07-28 ENCOUNTER — Other Ambulatory Visit: Payer: Self-pay

## 2018-07-28 DIAGNOSIS — I10 Essential (primary) hypertension: Secondary | ICD-10-CM | POA: Diagnosis not present

## 2018-07-28 LAB — BASIC METABOLIC PANEL
BUN/Creatinine Ratio: 23 (ref 10–24)
BUN: 21 mg/dL (ref 8–27)
CO2: 21 mmol/L (ref 20–29)
Calcium: 9.3 mg/dL (ref 8.6–10.2)
Chloride: 100 mmol/L (ref 96–106)
Creatinine, Ser: 0.9 mg/dL (ref 0.76–1.27)
GFR calc Af Amer: 103 mL/min/{1.73_m2} (ref >59)
GFR calc non Af Amer: 89 mL/min/{1.73_m2} (ref >59)
Glucose: 271 mg/dL — ABNORMAL HIGH (ref 65–99)
Potassium: 4.8 mmol/L (ref 3.5–5.2)
Sodium: 138 mmol/L (ref 134–144)

## 2018-08-16 DIAGNOSIS — Z6836 Body mass index (BMI) 36.0-36.9, adult: Secondary | ICD-10-CM | POA: Diagnosis not present

## 2018-08-16 DIAGNOSIS — E119 Type 2 diabetes mellitus without complications: Secondary | ICD-10-CM | POA: Diagnosis not present

## 2018-08-16 DIAGNOSIS — E785 Hyperlipidemia, unspecified: Secondary | ICD-10-CM | POA: Diagnosis not present

## 2018-08-16 DIAGNOSIS — I4891 Unspecified atrial fibrillation: Secondary | ICD-10-CM | POA: Diagnosis not present

## 2018-08-16 DIAGNOSIS — Z7189 Other specified counseling: Secondary | ICD-10-CM | POA: Diagnosis not present

## 2018-08-16 DIAGNOSIS — R809 Proteinuria, unspecified: Secondary | ICD-10-CM | POA: Diagnosis not present

## 2018-08-16 DIAGNOSIS — I10 Essential (primary) hypertension: Secondary | ICD-10-CM | POA: Diagnosis not present

## 2018-08-18 DIAGNOSIS — Z79899 Other long term (current) drug therapy: Secondary | ICD-10-CM | POA: Diagnosis not present

## 2018-08-18 DIAGNOSIS — E785 Hyperlipidemia, unspecified: Secondary | ICD-10-CM | POA: Diagnosis not present

## 2018-08-22 DIAGNOSIS — Z952 Presence of prosthetic heart valve: Secondary | ICD-10-CM | POA: Diagnosis not present

## 2018-08-22 DIAGNOSIS — I10 Essential (primary) hypertension: Secondary | ICD-10-CM | POA: Diagnosis not present

## 2018-08-22 DIAGNOSIS — I480 Paroxysmal atrial fibrillation: Secondary | ICD-10-CM | POA: Diagnosis not present

## 2018-08-22 DIAGNOSIS — I350 Nonrheumatic aortic (valve) stenosis: Secondary | ICD-10-CM | POA: Diagnosis not present

## 2018-08-22 DIAGNOSIS — E785 Hyperlipidemia, unspecified: Secondary | ICD-10-CM | POA: Diagnosis not present

## 2018-09-21 DIAGNOSIS — E119 Type 2 diabetes mellitus without complications: Secondary | ICD-10-CM | POA: Diagnosis not present

## 2018-09-21 DIAGNOSIS — Z7189 Other specified counseling: Secondary | ICD-10-CM | POA: Diagnosis not present

## 2018-09-21 DIAGNOSIS — R809 Proteinuria, unspecified: Secondary | ICD-10-CM | POA: Diagnosis not present

## 2018-09-21 DIAGNOSIS — Z6836 Body mass index (BMI) 36.0-36.9, adult: Secondary | ICD-10-CM | POA: Diagnosis not present

## 2018-09-21 DIAGNOSIS — I4891 Unspecified atrial fibrillation: Secondary | ICD-10-CM | POA: Diagnosis not present

## 2018-09-21 DIAGNOSIS — I10 Essential (primary) hypertension: Secondary | ICD-10-CM | POA: Diagnosis not present

## 2018-09-21 DIAGNOSIS — E785 Hyperlipidemia, unspecified: Secondary | ICD-10-CM | POA: Diagnosis not present

## 2018-09-27 DIAGNOSIS — M21371 Foot drop, right foot: Secondary | ICD-10-CM | POA: Diagnosis not present

## 2018-10-04 DIAGNOSIS — M21371 Foot drop, right foot: Secondary | ICD-10-CM | POA: Diagnosis not present

## 2018-10-16 DIAGNOSIS — B379 Candidiasis, unspecified: Secondary | ICD-10-CM | POA: Diagnosis not present

## 2018-11-03 DIAGNOSIS — M21371 Foot drop, right foot: Secondary | ICD-10-CM | POA: Diagnosis not present

## 2018-11-07 ENCOUNTER — Ambulatory Visit (INDEPENDENT_AMBULATORY_CARE_PROVIDER_SITE_OTHER): Payer: Medicare Other | Admitting: Neurology

## 2018-11-07 ENCOUNTER — Other Ambulatory Visit: Payer: Self-pay

## 2018-11-07 ENCOUNTER — Encounter: Payer: Self-pay | Admitting: Neurology

## 2018-11-07 DIAGNOSIS — M21371 Foot drop, right foot: Secondary | ICD-10-CM | POA: Diagnosis not present

## 2018-11-07 HISTORY — DX: Foot drop, right foot: M21.371

## 2018-11-07 NOTE — Procedures (Signed)
     HISTORY:  Vernon Ruiz is a 68 year old gentleman with a history of diabetes who noted onset of painless right foot drop that began 2 months ago.  The patient has not had any improvement in his weakness since that time, he denies any significant back pain or pain down the leg.  He is being evaluated for possible neuropathy or a lumbosacral radiculopathy.  NERVE CONDUCTION STUDIES:  Nerve conduction studies were performed on both lower extremities.  The distal motor latencies for the peroneal and posterior tibial nerves are normal bilaterally with low motor amplitudes seen for the right posterior tibial nerve, normal for the left posterior tibial nerve and for the peroneal nerves bilaterally.  Slowing was seen above and below the fibular head for the right peroneal nerve, normal for the left peroneal nerve and significantly slowed for the right posterior tibial nerve, normal for the left posterior tibial nerve.  The sural and peroneal sensory latencies were within normal limits.  Prolongation of the F-wave latencies for the posterior tibial nerves were noted bilaterally.  EMG STUDIES:  EMG study was performed on the right lower extremity:  The tibialis anterior muscle reveals 1 to 2K motor units with severely reduced recruitment. 2+ fibrillations and positive waves were seen.  Low amplitude polyphasic motor units were seen.  2+ fasciculations were noted. The peroneus tertius muscle reveals 1 to 2K motor units with severely reduced recruitment. 2+ fibrillations and positive waves were seen.  Low amplitude polyphasic motor units were seen.  2+ fasciculations were noted. The medial gastrocnemius muscle reveals 1 to 4K motor units with decreased recruitment. 2+ fibrillations and positive waves were seen. The vastus lateralis muscle reveals 2 to 4K motor units with decreased recruitment. No fibrillations or positive waves were seen. The iliopsoas muscle reveals 2 to 4K motor units with full  recruitment. No fibrillations or positive waves were seen. The biceps femoris muscle (long head) reveals 2 to 3K motor units with slightly decreased recruitment. No fibrillations or positive waves were seen. The lumbosacral paraspinal muscles were tested at 3 levels, and revealed no abnormalities of insertional activity at all 3 levels tested. There was good relaxation.   IMPRESSION:  Nerve conduction studies done on both lower extremities showed no clear evidence of a generalized peripheral neuropathy but there appears to be involvement of the peroneal and posterior tibial nerve on the right with primarily motor features and some mixed demyelinating features.  EMG evaluation done on the right lower extremity does not show clear evidence of an overlying lumbosacral radiculopathy.  A single lesion in the distal sciatic nerve could produce the denervation pattern noted by EMG, muscles innervated by the posterior tibial nerve and peroneal nerve have been affected with acute and chronic denervation.  There is also sensory sparing, I suppose a vascular infarct of the peripheral nerve or multifocal motor neuropathy should be considered.  Conduction block is suggested involving the posterior tibial nerve in the popliteal fossa.  Clinical correlation is required.  Jill Alexanders MD 11/07/2018 11:16 AM  [facility] [address] [place], [place] [address]  Phone [phone] Fax [phone]

## 2018-11-07 NOTE — Progress Notes (Signed)
Please refer to EMG and nerve conduction procedure note.  

## 2018-11-08 DIAGNOSIS — E1169 Type 2 diabetes mellitus with other specified complication: Secondary | ICD-10-CM | POA: Diagnosis not present

## 2018-11-08 DIAGNOSIS — Z125 Encounter for screening for malignant neoplasm of prostate: Secondary | ICD-10-CM | POA: Diagnosis not present

## 2018-11-08 DIAGNOSIS — G4733 Obstructive sleep apnea (adult) (pediatric): Secondary | ICD-10-CM | POA: Diagnosis not present

## 2018-11-08 DIAGNOSIS — Z952 Presence of prosthetic heart valve: Secondary | ICD-10-CM | POA: Diagnosis not present

## 2018-11-08 DIAGNOSIS — I480 Paroxysmal atrial fibrillation: Secondary | ICD-10-CM | POA: Diagnosis not present

## 2018-11-08 DIAGNOSIS — E785 Hyperlipidemia, unspecified: Secondary | ICD-10-CM | POA: Diagnosis not present

## 2018-11-08 DIAGNOSIS — M21371 Foot drop, right foot: Secondary | ICD-10-CM | POA: Diagnosis not present

## 2018-11-08 DIAGNOSIS — I10 Essential (primary) hypertension: Secondary | ICD-10-CM | POA: Diagnosis not present

## 2018-11-08 NOTE — Progress Notes (Signed)
  [place]    Nerve / Sites Muscle Latency Ref. Amplitude Ref. Rel Amp Segments Distance Velocity Ref. Area    ms ms mV mV %  cm m/s m/s mVms  R Peroneal - EDB     Ankle EDB 6.1 ?6.5 2.8 ?2.0 100 Ankle - EDB 9   9.2     Fib head EDB 12.9  2.3  81.9 Fib head - Ankle 27 40 ?44 8.1     Pop fossa EDB 15.6  2.2  96.4 Pop fossa - Fib head 10 38 ?44 8.0         Pop fossa - Ankle      L Peroneal - EDB     Ankle EDB 5.1 ?6.5 4.7 ?2.0 100 Ankle - EDB 9   14.8     Fib head EDB 11.0  3.8  81.7 Fib head - Ankle 27 45 ?44 13.3     Pop fossa EDB 13.3  3.7  97.3 Pop fossa - Fib head 10 44 ?44 12.5         Pop fossa - Ankle      R Tibial - AH     Ankle AH 4.6 ?5.8 1.4 ?4.0 100 Ankle - AH 9   8.4     Pop fossa AH 19.0  0.1  6.9 Pop fossa - Ankle 35 24 ?41 0.3  L Tibial - AH     Ankle AH 4.2 ?5.8 4.5 ?4.0 100 Ankle - AH 9   23.5     Pop fossa AH 12.6  2.0  44.7 Pop fossa - Ankle 35 42 ?41 14.6             SNC    Nerve / Sites Rec. Site Peak Lat Ref.  Amp Ref. Segments Distance    ms ms V V  cm  R Sural - Ankle (Calf)     Calf Ankle 3.9 ?4.4 7 ?6 Calf - Ankle 14  L Sural - Ankle (Calf)     Calf Ankle 3.4 ?4.4 9 ?6 Calf - Ankle 14  R Superficial peroneal - Ankle     Lat leg Ankle 4.4 ?4.4 5 ?6 Lat leg - Ankle 14  L Superficial peroneal - Ankle     Lat leg Ankle 4.0 ?4.4 2 ?6 Lat leg - Ankle 14              F  Wave    Nerve F Lat Ref.   ms ms  R Tibial - AH 60.6 ?56.0  L Tibial - AH 57.0 ?56.0

## 2018-11-22 DIAGNOSIS — E785 Hyperlipidemia, unspecified: Secondary | ICD-10-CM | POA: Diagnosis not present

## 2018-11-22 DIAGNOSIS — I480 Paroxysmal atrial fibrillation: Secondary | ICD-10-CM | POA: Diagnosis not present

## 2018-11-22 DIAGNOSIS — Z952 Presence of prosthetic heart valve: Secondary | ICD-10-CM | POA: Diagnosis not present

## 2018-11-22 DIAGNOSIS — I10 Essential (primary) hypertension: Secondary | ICD-10-CM | POA: Diagnosis not present

## 2018-11-28 DIAGNOSIS — M21371 Foot drop, right foot: Secondary | ICD-10-CM | POA: Diagnosis not present

## 2018-12-07 ENCOUNTER — Telehealth: Payer: Self-pay | Admitting: Neurology

## 2018-12-07 NOTE — Telephone Encounter (Signed)
Lexine Baton, This is a referral from Dr. Erlinda Hong in the hospital for foot drop. Can we get him an appointment. We can open up next Monday at 330.

## 2018-12-11 ENCOUNTER — Ambulatory Visit (INDEPENDENT_AMBULATORY_CARE_PROVIDER_SITE_OTHER): Payer: Medicare Other | Admitting: Cardiovascular Disease

## 2018-12-11 ENCOUNTER — Other Ambulatory Visit: Payer: Self-pay | Admitting: Cardiovascular Disease

## 2018-12-11 ENCOUNTER — Other Ambulatory Visit: Payer: Self-pay

## 2018-12-11 ENCOUNTER — Encounter: Payer: Self-pay | Admitting: Cardiovascular Disease

## 2018-12-11 VITALS — BP 158/70 | HR 54 | Ht 65.0 in | Wt 208.8 lb

## 2018-12-11 DIAGNOSIS — I10 Essential (primary) hypertension: Secondary | ICD-10-CM

## 2018-12-11 DIAGNOSIS — I359 Nonrheumatic aortic valve disorder, unspecified: Secondary | ICD-10-CM

## 2018-12-11 DIAGNOSIS — I480 Paroxysmal atrial fibrillation: Secondary | ICD-10-CM | POA: Diagnosis not present

## 2018-12-11 DIAGNOSIS — M79604 Pain in right leg: Secondary | ICD-10-CM | POA: Diagnosis not present

## 2018-12-11 MED ORDER — AMLODIPINE BESYLATE 5 MG PO TABS: 5.0000 mg | ORAL_TABLET | Freq: Every day | ORAL | 3 refills | Status: DC

## 2018-12-11 NOTE — Patient Instructions (Signed)
Medication Instructions:  1) START AMLODIPINE 5 mg daily  Labwork: None  Testing/Procedures: Your physician has requested that you have an ankle brachial index (ABI). During this test an ultrasound and blood pressure cuff are used to evaluate the arteries that supply the arms and legs with blood. Allow thirty minutes for this exam. There are no restrictions or special instructions.  Follow-Up: Your provider wants you to follow-up in: 6 months with Dr. Burt Knack. You will receive a reminder letter in the mail two months in advance. If you don't receive a letter, please call our office to schedule the follow-up appointment.

## 2018-12-11 NOTE — Progress Notes (Signed)
Cardiology Office Note:    Date:  12/11/2018   ID:  Vernon Ruiz, DOB 03-26-1951, MRN [id number]  PCP:  [name], MD  Cardiologist:  No primary care provider on file.  Electrophysiologist:  None   Referring MD: [name], MD   Chief Complaint  Patient presents with  . Follow-up    aortic valve disease    History of Present Illness:    Vernon Ruiz is a 68 y.o. male with a hx of aortic valve disease and paroxysmal atrial fibrillation, presenting for follow-up evaluation.  The patient underwent bioprosthetic aortic valve replacement in 2018 with a 23 mm magna ease pericardial tissue valve.  His heart surgery was uncomplicated.  Comorbid medical conditions include type 2 diabetes, hypertension, and obstructive sleep apnea.  Patient is doing okay from a cardiac perspective.  He denies chest pain, chest pressure, shortness of breath, lightheadedness, or heart palpitations.  He notes that his blood pressure has been running in the range of 150 to 160 mmHg at home.  He is having some problems with headaches.  His biggest issue of late has been the development of foot drop on the right side.  He is currently undergoing evaluation and is scheduled to see Dr. Anne Hahn tomorrow.  He recently quit taking his statin drug because of muscle aches.  Past Medical History:  Diagnosis Date  . Aortic stenosis    s/p AVR using a 19mm Edwards Magna-Ease pericardial valve 10/04/16  . Coronary artery disease   . Diabetes mellitus without complication (HCC)   . GERD (gastroesophageal reflux disease)   . Heart murmur   . Hypertension   . Obstructive sleep apnea    positive from a home test, still needs to get another study done  . Persistent atrial fibrillation    s/p clipping of LA appendage 10/04/16  . Right foot drop 11/07/2018    Past Surgical History:  Procedure Laterality Date  . AORTIC VALVE REPLACEMENT N/A 10/04/2016   Procedure: AORTIC VALVE REPLACEMENT (AVR);  Surgeon: [name],  MD;  Location: [facility] OR;  Service: Open Heart Surgery;  Laterality: N/A;  . APPENDECTOMY    . CARDIAC CATHETERIZATION N/A 03/10/2016   Procedure: Right/Left Heart Cath and Coronary Angiography;  Surgeon: [name], MD;  Location: [facility] INVASIVE CV LAB;  Service: Cardiovascular;  Laterality: N/A;  . CLIPPING OF ATRIAL APPENDAGE N/A 10/04/2016   Procedure: CLIPPING OF ATRIAL APPENDAGE;  Surgeon: [name], MD;  Location: [facility] OR;  Service: Open Heart Surgery;  Laterality: N/A;  . COLONOSCOPY    . EYE SURGERY Right    growth removed from eye  . HERNIA REPAIR Bilateral    inguinal  . TEE WITHOUT CARDIOVERSION N/A 10/04/2016   Procedure: TRANSESOPHAGEAL ECHOCARDIOGRAM (TEE);  Surgeon: [name], MD;  Location: [facility] OR;  Service: Open Heart Surgery;  Laterality: N/A;    Current Medications: Current Meds  Medication Sig  . acetaminophen (TYLENOL) 325 MG tablet Take 2 tablets (650 mg total) by mouth every 6 (six) hours as needed for mild pain.  Marland Kitchen apixaban (ELIQUIS) 5 MG TABS tablet Take 5 mg by mouth 2 (two) times daily.  Marland Kitchen aspirin EC 81 MG tablet Take 1 tablet (81 mg total) by mouth daily.  . Cyanocobalamin (VITAMIN B 12 PO) Take 1 tablet by mouth every other day.   . Dulaglutide (TRULICITY) 1.5 MG/0.5ML SOPN Trulicity 1.5 mg/0.5 mL subcutaneous pen injector  INJECT THE CONTENTS OF 1 PEN INTO THE SKIN ONCE  WEEKLY  . glimepiride (AMARYL) 4 MG tablet Take 2 tablets by mouth daily.  . Insulin Glargine (TOUJEO SOLOSTAR [place]) Inject 30 Units into the skin every evening.   Marland Kitchen losartan (COZAAR) 100 MG tablet Take 1 tablet (100 mg total) by mouth daily.  . metFORMIN (GLUCOPHAGE) 1000 MG tablet Take 1,000 mg by mouth 2 (two) times daily.   . metoprolol tartrate (LOPRESSOR) 50 MG tablet Take 50 mg by mouth 2 (two) times daily.     Allergies:   Penicillins, Sulfa antibiotics, and Sulfamethoxazole   Social History   Socioeconomic History  . Marital status: Married    Spouse name: Not on file  .  Number of children: Not on file  . Years of education: Not on file  . Highest education level: Not on file  Occupational History  . Not on file  Social Needs  . Financial resource strain: Not on file  . Food insecurity    Worry: Not on file    Inability: Not on file  . Transportation needs    Medical: Not on file    Non-medical: Not on file  Tobacco Use  . Smoking status: Former Smoker    Types: Cigars    Quit date: 09/16/2016    Years since quitting: 2.2  . Smokeless tobacco: Never Used  Substance and Sexual Activity  . Alcohol use: Yes    Comment: very rare  . Drug use: No  . Sexual activity: Not on file  Lifestyle  . Physical activity    Days per week: Not on file    Minutes per session: Not on file  . Stress: Not on file  Relationships  . Social Herbalist on phone: Not on file    Gets together: Not on file    Attends religious service: Not on file    Active member of club or organization: Not on file    Attends meetings of clubs or organizations: Not on file    Relationship status: Not on file  Other Topics Concern  . Not on file  Social History Narrative   Retired   Owns hotels     Family History: The patient's family history is not on file.  ROS:   Please see the history of present illness.    All other systems reviewed and are negative.  EKGs/Labs/Other Studies Reviewed:    The following studies were reviewed today: Echo 06/16/2018: IMPRESSIONS    1. The left ventricle has normal systolic function with an ejection fraction of 60-65%. The cavity size was normal. There is mildly increased left ventricular wall thickness. Left ventricular diastolic parameters were normal.  2. The right ventricle has normal systolic function. The cavity was normal. There is no increase in right ventricular wall thickness.  3. The mitral valve is normal in structure. Mild thickening of the mitral valve leaflet.  4. The tricuspid valve is normal in structure.   5. The aortic valve was not well visualized.  6. The pulmonic valve was normal in structure.  7. A prosthesis is difficult to see Peak and mean gradients through the valve are 32 and 20 mm Hg respectively This is mildly increased from echo in 2018.  8. The interatrial septum was not assessed.  FINDINGS  Left Ventricle: The left ventricle has normal systolic function, with an ejection fraction of 60-65%. The cavity size was normal. There is mildly increased left ventricular wall thickness. Left ventricular diastolic parameters were normal Right Ventricle: The right  ventricle has normal systolic function. The cavity was normal. There is no increase in right ventricular wall thickness. Left Atrium: left atrial size was normal in size Right Atrium: right atrial size was normal in size. Right atrial pressure is estimated at 3 mmHg. Interatrial Septum: The interatrial septum was not assessed. Pericardium: There is no evidence of pericardial effusion. Mitral Valve: The mitral valve is normal in structure. Mild thickening of the mitral valve leaflet. Mitral valve regurgitation is trivial by color flow Doppler. Tricuspid Valve: The tricuspid valve is normal in structure. Tricuspid valve regurgitation is mild by color flow Doppler. Aortic Valve: The aortic valve was not well visualized Aortic valve regurgitation was not visualized by color flow Doppler. Pulmonic Valve: The pulmonic valve was normal in structure. Pulmonic valve regurgitation is not visualized by color flow Doppler. Venous: The inferior vena cava measures 2.18 cm, is normal in size with greater than 50% respiratory variability. Additional Comments: A prosthesis is difficult to see Peak and mean gradients through the valve are 32 and 20 mm Hg respectively This is mildly increased from echo in 2018. Compared to previous exam: 03/11/17 EF 60% AVR gradients mean 18 mm Hg and peak 34 mm Hg.  EKG:  EKG is not ordered today.    Recent Labs:  07/28/2018: BUN 21; Creatinine, Ser 0.90; Potassium 4.8; Sodium 138  Recent Lipid Panel No results found for: CHOL, TRIG, HDL, CHOLHDL, VLDL, LDLCALC, LDLDIRECT  Physical Exam:    VS:  BP (!) 158/70   Pulse (!) 54   Ht 5\' 5"  (1.651 m)   Wt 208 lb 12.8 oz (94.7 kg)   SpO2 97%   BMI 34.75 kg/m     Wt Readings from Last 3 Encounters:  12/11/18 208 lb 12.8 oz (94.7 kg)  12/21/17 209 lb (94.8 kg)  06/30/17 214 lb (97.1 kg)     GEN:  Well nourished, well developed in no acute distress HEENT: Normal NECK: No JVD; No carotid bruits LYMPHATICS: No lymphadenopathy CARDIAC: RRR, 2/6 SEM at the RUSB RESPIRATORY:  Clear to auscultation without rales, wheezing or rhonchi  ABDOMEN: Soft, non-tender, non-distended MUSCULOSKELETAL:  No edema; No deformity, peripheral/pedal pulses 1+  SKIN: Warm and dry NEUROLOGIC:  Alert and oriented x 3 PSYCHIATRIC:  Normal affect   ASSESSMENT:    1. Essential hypertension   2. Aortic valve disorder   3. PAF (paroxysmal atrial fibrillation) (HCC)   4. Right leg pain    PLAN:    In order of problems listed above:  1. Blood pressure is above goal.  I reviewed his medications today.  He previously has been on Spironolactone but this was discontinued.  It is not clear to me but I suspect he had some side effect or problem with hyperkalemia.  The patient does not remember.  He will continue on losartan and metoprolol at current doses.  I have recommended adding amlodipine 5 mg daily. 2. His aortic bioprosthesis is stable with no paravalvular regurgitation.  There are mildly elevated gradients which have been stable over time. 3. He continues on anticoagulation with apixaban and appears to be maintaining sinus rhythm. 4. He is concerned about pain that he has developed in the right leg associated with foot drop.  There is some question about arterial insufficiency the has been brought up.  His pedal pulses are present but diminished.  I have recommended ABIs  in the setting of diabetes.  Medication Adjustments/Labs and Tests Ordered: Current medicines are reviewed at length with the  patient today.  Concerns regarding medicines are outlined above.  Orders Placed This Encounter  Procedures  . VAS US LE ART SEG MULTI (Segm&LE Reynauds)   Meds ordered this encounter  Medications  . amLODipine (NORVASC) 5 MG tablet    Sig: Take 1 tablet (5 mg total) by mouth daily.    Dispense:  90 tablet    Refill:  3    Patient Instructions  Medication Instructions:  1) START AMLODIPINE 5 mg daily  Labwork: None  Testing/Procedures: Your physician has requested that you have an ankle brachial index (ABI). During this test an ultrasound and blood pressure cuff are used to evaluate the arteries that supply the arms and legs with blood. Allow thirty minutes for this exam. There are no restrictions or special instructions.  Follow-Up: Your provider wants you to follow-up in: 6 months with Dr. Excell Seltzerooper. You will receive a reminder letter in the mail two months in advance. If you don't receive a letter, please call our office to schedule the follow-up appointment.       Signed, Tonny BollmanMichael Paisleigh Maroney, MD  12/11/2018 1:08 PM    [facility]

## 2018-12-12 ENCOUNTER — Inpatient Hospital Stay (HOSPITAL_COMMUNITY): Payer: Medicare Other

## 2018-12-12 ENCOUNTER — Encounter (HOSPITAL_COMMUNITY): Payer: Self-pay | Admitting: Emergency Medicine

## 2018-12-12 ENCOUNTER — Telehealth: Payer: Self-pay | Admitting: Neurology

## 2018-12-12 ENCOUNTER — Encounter: Payer: Self-pay | Admitting: Neurology

## 2018-12-12 ENCOUNTER — Ambulatory Visit (INDEPENDENT_AMBULATORY_CARE_PROVIDER_SITE_OTHER): Payer: Medicare Other | Admitting: Neurology

## 2018-12-12 ENCOUNTER — Inpatient Hospital Stay (HOSPITAL_COMMUNITY)
Admission: EM | Admit: 2018-12-12 | Discharge: 2018-12-13 | DRG: 066 | Disposition: A | Discharge status: Home / Self Care | Payer: Medicare Other | Source: Ambulatory Visit | Attending: Internal Medicine | Admitting: Internal Medicine

## 2018-12-12 ENCOUNTER — Emergency Department (HOSPITAL_COMMUNITY): Payer: Medicare Other

## 2018-12-12 ENCOUNTER — Other Ambulatory Visit: Payer: Self-pay

## 2018-12-12 ENCOUNTER — Encounter (HOSPITAL_COMMUNITY): Payer: Medicare Other

## 2018-12-12 VITALS — BP 161/82 | HR 68 | Temp 97.3°F | Ht 65.0 in | Wt 208.1 lb

## 2018-12-12 DIAGNOSIS — M6281 Muscle weakness (generalized): Secondary | ICD-10-CM | POA: Diagnosis not present

## 2018-12-12 DIAGNOSIS — Z6830 Body mass index (BMI) 30.0-30.9, adult: Secondary | ICD-10-CM

## 2018-12-12 DIAGNOSIS — Z20828 Contact with and (suspected) exposure to other viral communicable diseases: Secondary | ICD-10-CM | POA: Diagnosis not present

## 2018-12-12 DIAGNOSIS — Z880 Allergy status to penicillin: Secondary | ICD-10-CM

## 2018-12-12 DIAGNOSIS — I6622 Occlusion and stenosis of left posterior cerebral artery: Secondary | ICD-10-CM | POA: Diagnosis not present

## 2018-12-12 DIAGNOSIS — E1165 Type 2 diabetes mellitus with hyperglycemia: Secondary | ICD-10-CM | POA: Diagnosis present

## 2018-12-12 DIAGNOSIS — I63532 Cerebral infarction due to unspecified occlusion or stenosis of left posterior cerebral artery: Secondary | ICD-10-CM | POA: Diagnosis not present

## 2018-12-12 DIAGNOSIS — Z794 Long term (current) use of insulin: Secondary | ICD-10-CM

## 2018-12-12 DIAGNOSIS — I63432 Cerebral infarction due to embolism of left posterior cerebral artery: Secondary | ICD-10-CM | POA: Diagnosis not present

## 2018-12-12 DIAGNOSIS — G4733 Obstructive sleep apnea (adult) (pediatric): Secondary | ICD-10-CM | POA: Diagnosis present

## 2018-12-12 DIAGNOSIS — I63512 Cerebral infarction due to unspecified occlusion or stenosis of left middle cerebral artery: Secondary | ICD-10-CM | POA: Diagnosis not present

## 2018-12-12 DIAGNOSIS — R531 Weakness: Secondary | ICD-10-CM

## 2018-12-12 DIAGNOSIS — I63012 Cerebral infarction due to thrombosis of left vertebral artery: Secondary | ICD-10-CM | POA: Diagnosis not present

## 2018-12-12 DIAGNOSIS — Z882 Allergy status to sulfonamides status: Secondary | ICD-10-CM

## 2018-12-12 DIAGNOSIS — I6523 Occlusion and stenosis of bilateral carotid arteries: Secondary | ICD-10-CM | POA: Diagnosis not present

## 2018-12-12 DIAGNOSIS — R0989 Other specified symptoms and signs involving the circulatory and respiratory systems: Secondary | ICD-10-CM | POA: Diagnosis present

## 2018-12-12 DIAGNOSIS — I2510 Atherosclerotic heart disease of native coronary artery without angina pectoris: Secondary | ICD-10-CM | POA: Diagnosis present

## 2018-12-12 DIAGNOSIS — G8311 Monoplegia of lower limb affecting right dominant side: Secondary | ICD-10-CM | POA: Diagnosis present

## 2018-12-12 DIAGNOSIS — R413 Other amnesia: Secondary | ICD-10-CM | POA: Diagnosis present

## 2018-12-12 DIAGNOSIS — I639 Cerebral infarction, unspecified: Secondary | ICD-10-CM | POA: Diagnosis not present

## 2018-12-12 DIAGNOSIS — Z7901 Long term (current) use of anticoagulants: Secondary | ICD-10-CM | POA: Diagnosis not present

## 2018-12-12 DIAGNOSIS — I6389 Other cerebral infarction: Secondary | ICD-10-CM | POA: Diagnosis not present

## 2018-12-12 DIAGNOSIS — I480 Paroxysmal atrial fibrillation: Secondary | ICD-10-CM | POA: Diagnosis not present

## 2018-12-12 DIAGNOSIS — I10 Essential (primary) hypertension: Secondary | ICD-10-CM | POA: Diagnosis present

## 2018-12-12 DIAGNOSIS — E669 Obesity, unspecified: Secondary | ICD-10-CM | POA: Diagnosis present

## 2018-12-12 DIAGNOSIS — H5340 Unspecified visual field defects: Secondary | ICD-10-CM | POA: Diagnosis present

## 2018-12-12 DIAGNOSIS — Z87891 Personal history of nicotine dependence: Secondary | ICD-10-CM | POA: Diagnosis not present

## 2018-12-12 DIAGNOSIS — R410 Disorientation, unspecified: Secondary | ICD-10-CM | POA: Diagnosis present

## 2018-12-12 DIAGNOSIS — M21371 Foot drop, right foot: Secondary | ICD-10-CM | POA: Diagnosis present

## 2018-12-12 DIAGNOSIS — E538 Deficiency of other specified B group vitamins: Secondary | ICD-10-CM

## 2018-12-12 DIAGNOSIS — Z7982 Long term (current) use of aspirin: Secondary | ICD-10-CM | POA: Diagnosis not present

## 2018-12-12 DIAGNOSIS — G629 Polyneuropathy, unspecified: Secondary | ICD-10-CM

## 2018-12-12 DIAGNOSIS — I6300 Cerebral infarction due to thrombosis of unspecified precerebral artery: Secondary | ICD-10-CM

## 2018-12-12 DIAGNOSIS — R29701 NIHSS score 1: Secondary | ICD-10-CM | POA: Diagnosis present

## 2018-12-12 DIAGNOSIS — Z953 Presence of xenogenic heart valve: Secondary | ICD-10-CM

## 2018-12-12 DIAGNOSIS — Z952 Presence of prosthetic heart valve: Secondary | ICD-10-CM | POA: Diagnosis not present

## 2018-12-12 HISTORY — DX: Other specified symptoms and signs involving the circulatory and respiratory systems: R09.89

## 2018-12-12 HISTORY — DX: Cerebral infarction, unspecified: I63.9

## 2018-12-12 LAB — COMPREHENSIVE METABOLIC PANEL
ALT: 26 U/L (ref 0–44)
AST: 28 U/L (ref 15–41)
Albumin: 4.1 g/dL (ref 3.5–5.0)
Alkaline Phosphatase: 45 U/L (ref 38–126)
Anion gap: 14 (ref 5–15)
BUN: 20 mg/dL (ref 8–23)
CO2: 22 mmol/L (ref 22–32)
Calcium: 9.6 mg/dL (ref 8.9–10.3)
Chloride: 101 mmol/L (ref 98–111)
Creatinine, Ser: 0.97 mg/dL (ref 0.61–1.24)
GFR calc Af Amer: >60 mL/min (ref >60)
GFR calc non Af Amer: >60 mL/min (ref >60)
Glucose, Bld: 140 mg/dL — ABNORMAL HIGH (ref 70–99)
Potassium: 4.3 mmol/L (ref 3.5–5.1)
Sodium: 137 mmol/L (ref 135–145)
Total Bilirubin: 0.6 mg/dL (ref 0.3–1.2)
Total Protein: 7.2 g/dL (ref 6.5–8.1)

## 2018-12-12 LAB — GLUCOSE, CAPILLARY
Glucose-Capillary: 88 mg/dL (ref 70–99)
Glucose-Capillary: 217 mg/dL — ABNORMAL HIGH (ref 70–99)

## 2018-12-12 LAB — CBC
HCT: 47 % (ref 39.0–52.0)
Hemoglobin: 14.9 g/dL (ref 13.0–17.0)
MCH: 25.7 pg — ABNORMAL LOW (ref 26.0–34.0)
MCHC: 31.7 g/dL (ref 30.0–36.0)
MCV: 81.2 fL (ref 80.0–100.0)
Platelets: 260 10*3/uL (ref 150–400)
RBC: 5.79 MIL/uL (ref 4.22–5.81)
RDW: 15 % (ref 11.5–15.5)
WBC: 10.1 10*3/uL (ref 4.0–10.5)
nRBC: 0 % (ref 0.0–0.2)

## 2018-12-12 LAB — SARS CORONAVIRUS 2 (TAT 6-24 HRS): SARS Coronavirus 2: NEGATIVE

## 2018-12-12 LAB — CBG MONITORING, ED: Glucose-Capillary: 106 mg/dL — ABNORMAL HIGH (ref 70–99)

## 2018-12-12 IMAGING — MR MR MRA HEAD W/O CM
11 of 12 series · 15 of 16 positions shown · IV contrast (gadavist)
Comparison: Prior CT from earlier the same day.

CLINICAL DATA: Initial evaluation for acute stroke.



[Series 6: DWI · coronal · 4.0mm · 0.94mm/px · 1 of 72 slices shown]
[im 1/72]
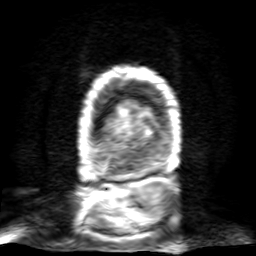

[Series 7: FLAIR · axial · 5.0mm · 0.47mm/px · 1 of 27 slices shown]
[im 1/27]
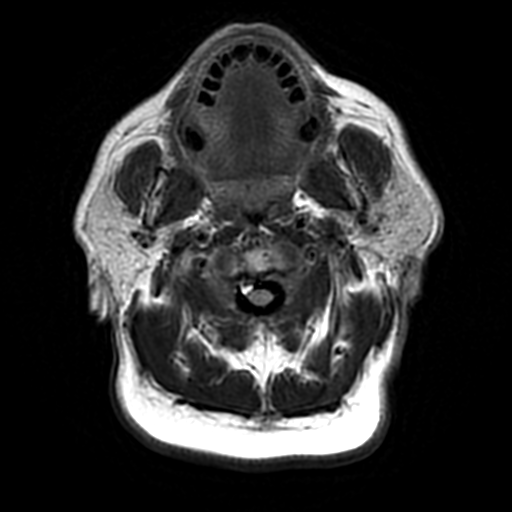

[Series 8: SWI · axial · 3.0mm · 0.47mm/px · 1 of 108 slices shown]
[im 1/108]
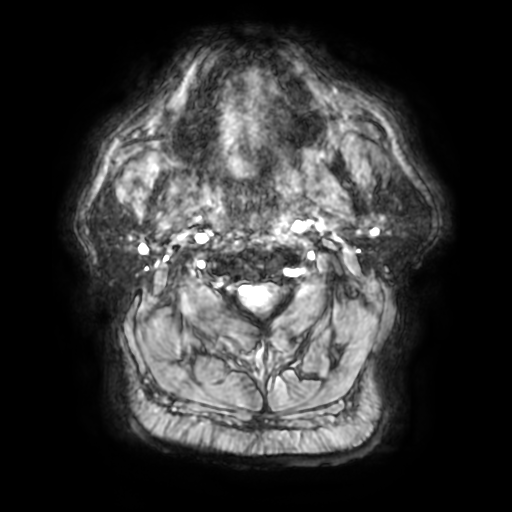

[Series 9: T2 · coronal · 5.0mm · 0.47mm/px · 1 of 30 slices shown]
[im 1/30]
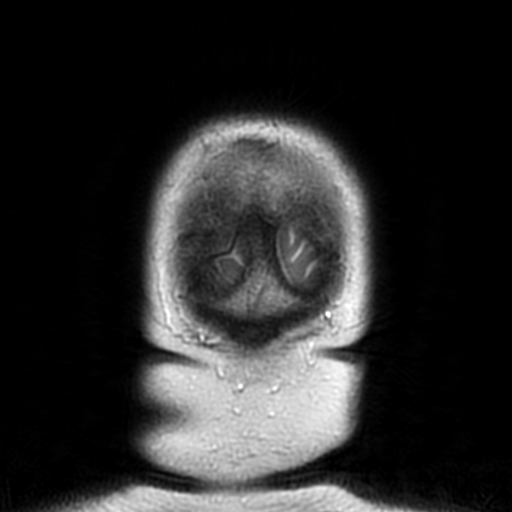

[Series 11: TOF · axial · 2.4mm · 0.47mm/px · 1 of 136 slices shown]
[im 1/136]
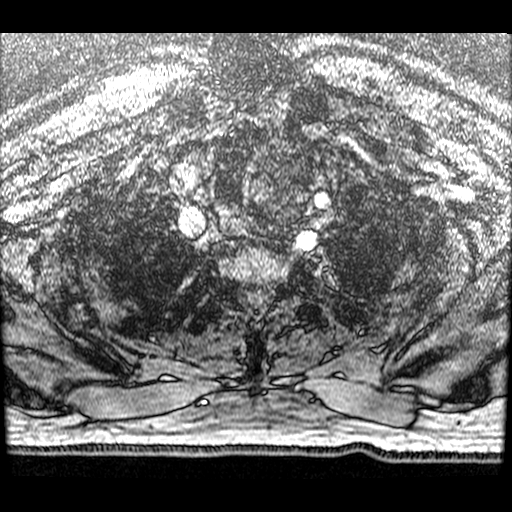

[Series 552: projection images · axial · 1.0mm · 0.39mm/px · 1 of 1 slices shown]
[im 1/1]
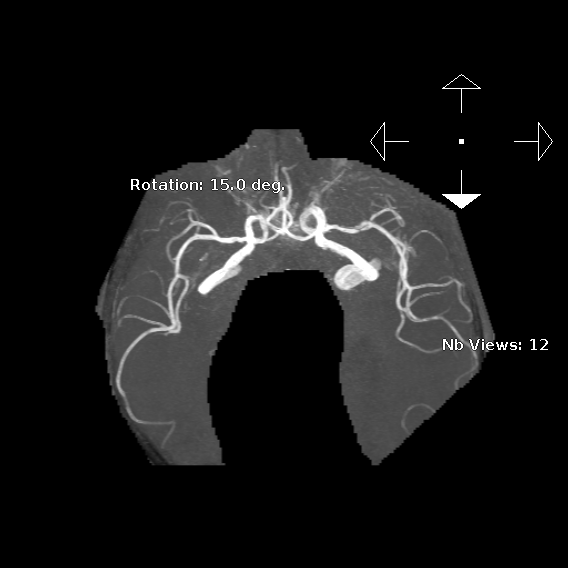

[Series 650: ADC · coronal · 4.0mm · 0.94mm/px · 1 of 36 slices shown]
[im 1/36]
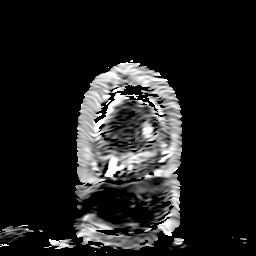

[Series 1201: ph1/cor cemra ft · coronal · 1.2mm · 0.59mm/px · 2 of 136 slices shown]
[im 1/136]
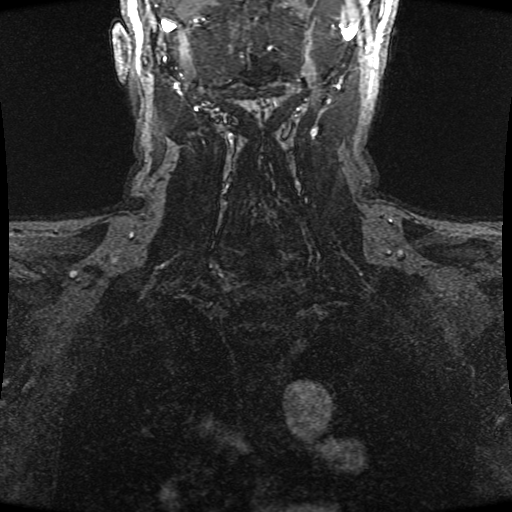
[im 136/136]
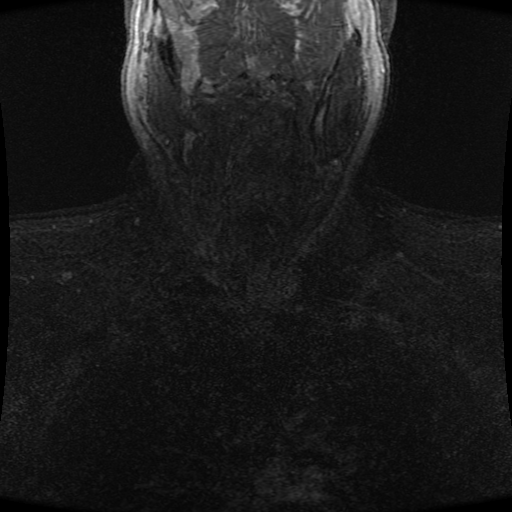

[Series 1202: ph2/cor cemra ft · coronal · 1.2mm · 0.59mm/px · 2 of 137 slices shown]
[im 1/137]
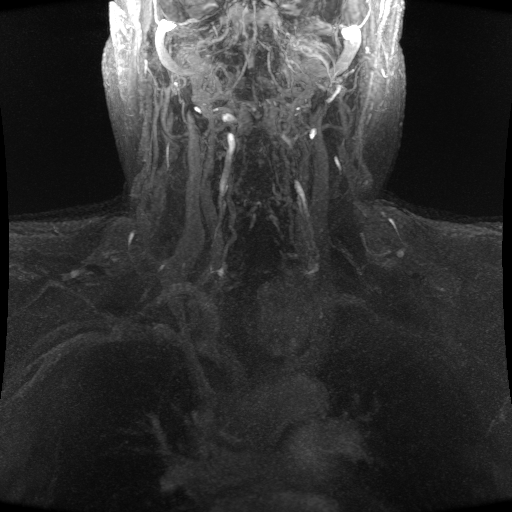
[im 137/137]
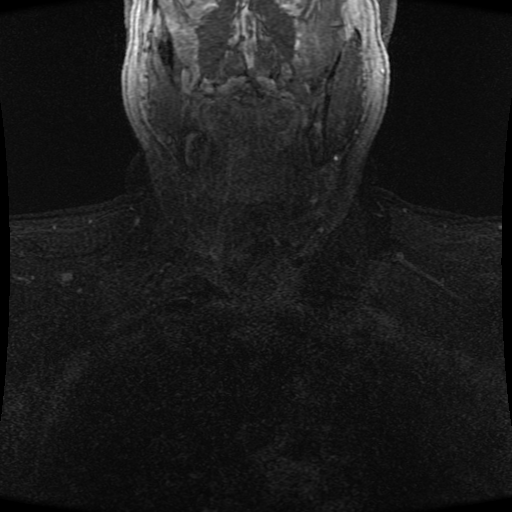

[((date))-((date)) · coronal · 1.2mm · 0.59mm/px · 2 of 136 slices shown (1 of 2)]
[im 1/136]
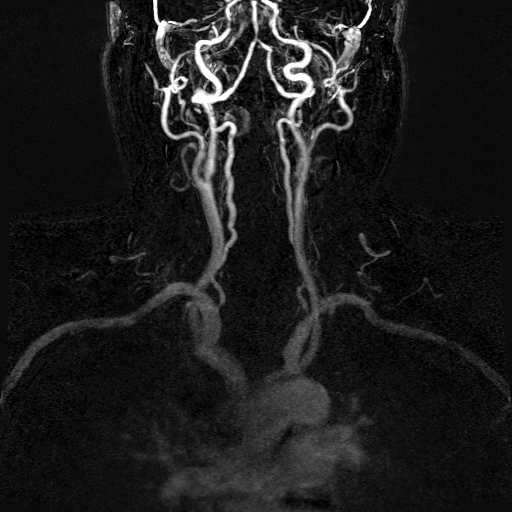
[im 136/136]
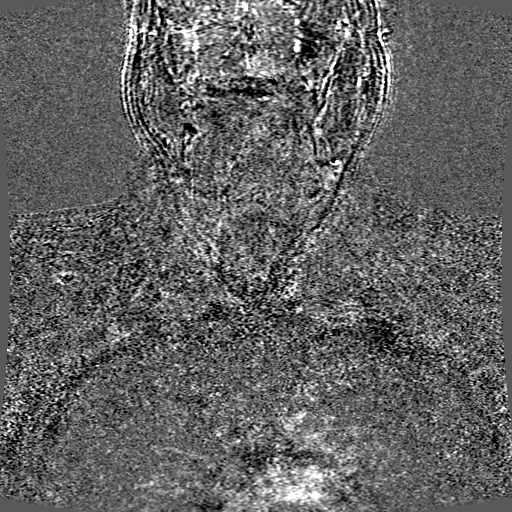

[((date))-((date)) · coronal · 1.2mm · 0.59mm/px · 2 of 134 slices shown (2 of 2)]
[im 1/134]
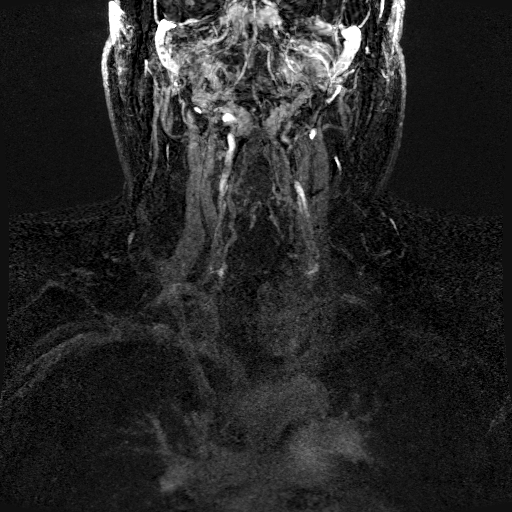
[im 134/134]
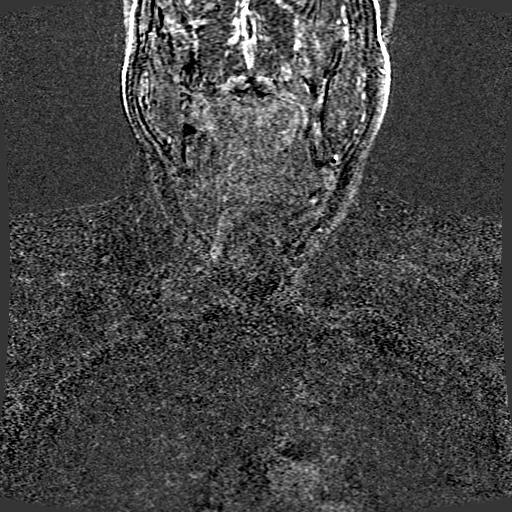

[15 of 16 positions shown; findings below may reference images not displayed]

FINDINGS: MRI HEAD FINDINGS

Brain: Diffuse prominence of the CSF containing spaces compatible
with generalized age-related cerebral atrophy. Mild scattered
T2/FLAIR hyperintensity within the periventricular and deep white
matter both cerebral hemispheres most consistent with chronic small
vessel ischemic disease, minimal for age.

4.6 cm area of confluent restricted diffusion involving the mesial
left temporal occipital region consistent with acute left PCA
territory infarct (series 2, image 28). Mild extension into the
posterior left hippocampal formation. Minimal patchy involvement of
the lateral left thalamus. No associated hemorrhage or mass effect.
No other evidence for acute or subacute ischemia. Gray-white matter
differentiation otherwise maintained. No other areas of remote
cortical infarction. No foci of susceptibility artifact to suggest
acute or chronic intracranial hemorrhage.

No mass lesion, midline shift or mass effect. Ventricles normal in
size without evidence for hydrocephalus. Cavum et septum pellucidum
noted. Pituitary gland suprasellar region within normal limits.
Midline structures intact.

Vascular: Major intracranial vascular flow voids well maintained and
normal in appearance.

Skull and upper cervical spine: Craniocervical junction within
normal limits. Degenerative disc bulging noted at C3-4 with
resultant mild-to-moderate spinal stenosis (series 3, image 19).
Bone marrow signal intensity within normal limits. No scalp soft
tissue abnormality.

Sinuses/Orbits: Globes and orbital soft tissues within normal
limits. Paranasal sinuses are clear. Trace bilateral mastoid
effusions, of doubtful significance.

Other: None.

MRA HEAD FINDINGS

ANTERIOR CIRCULATION:

Distal cervical segments of the internal carotid arteries are patent
with symmetric antegrade flow. Distal cervical left ICA tortuous.
Scattered atheromatous irregularity within the petrous, cavernous,
and supraclinoid segments bilaterally. There is a short-segment
moderate to severe stenosis involving the vertical petrous right ICA
of up to 70% by NASCET criteria (series 5, image 113). No other
hemodynamically significant stenosis. ICA termini well perfused.
Atheromatous irregularity throughout the A1 segments without
high-grade stenosis. Left A1 dominant. Normal anterior communicating
artery complex. Anterior cerebral arteries widely patent to their
distal aspects without stenosis.

Multifocal atheromatous irregularity throughout the M1 segments
bilaterally without high-grade stenosis. Normal MCA bifurcations. On
the left, multifocal severe proximal M2 stenoses involving both
superior and inferior divisions noted (series 552, image 10) no
significant stenosis seen about the proximal right M2 branches.
Distal MCA branches well perfused and symmetric bilaterally.

POSTERIOR CIRCULATION:

Right vertebral artery slightly dominant and is patent to the
vertebrobasilar junction without stenosis. Short-segment moderate
stenosis involving the distal left V4 segment just prior to the
vertebrobasilar junction (series 5, image 128). Left PICA patent
proximally. Right PICA not seen. Short-segment moderate stenosis
noted at the proximal basilar artery, just prior to the takeoff of
the anterior inferior cerebral arteries (series 5, image 123).
Basilar otherwise widely patent to its distal aspect. Superior
cerebral arteries patent bilaterally. Both of the posterior cerebral
arteries primarily supplied via the basilar. Scattered atheromatous
irregularity within the right PCA with smooth moderate narrowing of
the mid right P2 segment (series 555, image 12). Right PCA is
perfused to its distal aspect. Short-segment moderate proximal left
P2 stenosis (series 555, image 9). Distally, there is abrupt
occlusion of a proximal left P3 segment (series 555, image 8), acute
in nature given the acute left PCA territory infarct.

No intracranial aneurysm.

MRA NECK FINDINGS

Source images reviewed.

Visualized aortic arch of normal caliber with normal 3 vessel
morphology. No hemodynamically significant stenosis seen about the
origin of the great vessels. Visualized left subclavian artery
widely patent. Short-segment stenosis of up to approximately 40%
seen at the origin of the right subclavian artery (series [SZ],
image 45). Right subclavian otherwise widely patent.

Right common carotid artery widely patent from its origin to the
bifurcation without stenosis. Atheromatous irregularity about the
proximal right ICA with associated mild stenosis of up to 40% by
NASCET criteria. Right ICA mildly tortuous but otherwise patent to
the skull base without stenosis or occlusion.

Left common carotid artery patent from its origin to the bifurcation
without stenosis. Atheromatous irregularity about the origin of the
left ICA with associated mild stenosis of up to approximately 40% by
NASCET criteria. Left ICA tortuous but otherwise widely patent to
the skull base without stenosis or occlusion.

Both vertebral arteries arise from the subclavian arteries. Right
vertebral artery slightly dominant. Vertebral arteries widely patent
within the neck without stenosis or occlusion.
IMPRESSION: MRI HEAD IMPRESSION:

1. Moderate sized acute ischemic left PCA territory infarct
involving the mesial left temporal occipital region with patchy
involvement of the left thalamus as above. No associated hemorrhage
or mass effect.
2. Underlying age-related cerebral atrophy with mild chronic
microvascular ischemic disease.
3. Broad posterior disc protrusion at C3-4 with resultant
mild-to-moderate spinal stenosis. Finding could be further assessed
with dedicated MRI of the cervical spine as clinically desired.

MRA HEAD IMPRESSION:

1. Acute left P3 occlusion, corresponding with the acute left PCA
territory infarct.
2. Moderate multifocal stenoses involving the distal left V4 segment
and proximal basilar artery as above.
3. Short-segment 70% stenosis involving the vertical petrous right
ICA.
4. Multifocal severe proximal left M2 stenoses as above.
5. Moderate atherosclerotic irregularity elsewhere throughout the
intracranial circulation. No other hemodynamically significant or
correctable stenosis identified.

MRA NECK IMPRESSION:

1. Atheromatous irregularity with up to approximately 40% stenosis
about the origins of the internal carotid arteries bilaterally.
Otherwise wide patency of both carotid artery systems within the
neck.
2. Wide patency of both vertebral arteries within the neck. Right
vertebral artery slightly dominant.
3. Short-segment 40% stenosis at the origin of the right subclavian
artery.

## 2018-12-12 IMAGING — MR MR HEAD W/O CM
10 of 17 series · 20 of 48 positions shown · IV contrast (gadavist)
Comparison: Prior CT from earlier the same day.

CLINICAL DATA: Initial evaluation for acute stroke.



[Series 2: DWI · axial · 3.0mm · 0.94mm/px · z∈[-58,+112]mm · 4 of 116 slices shown (1 of 2)]
[im 1/116]
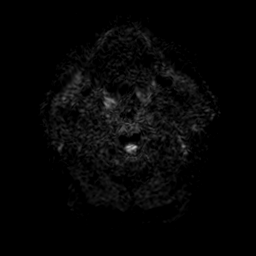
[im 39/116]
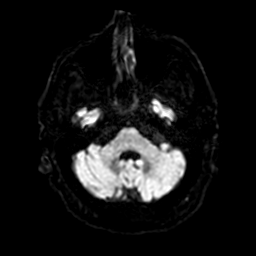
[im 77/116]
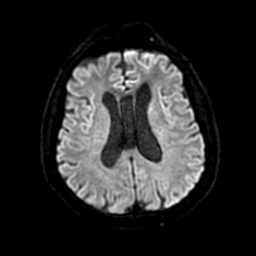
[im 116/116]
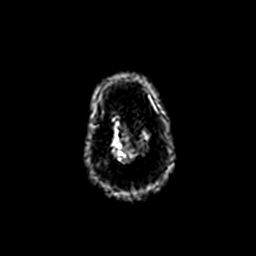

[Series 3: FLAIR · sagittal · 3.0mm · 0.47mm/px · 1 of 36 slices shown (1 of 2)]
[im 1/36]
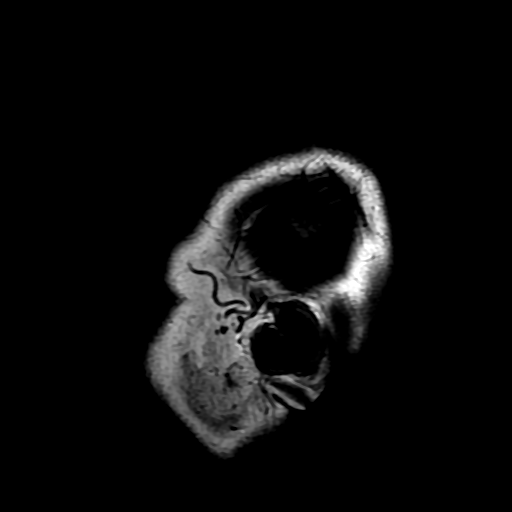

[Series 4: T2 · axial · 5.0mm · 0.47mm/px · 1 of 27 slices shown (1 of 2)]
[im 1/27]
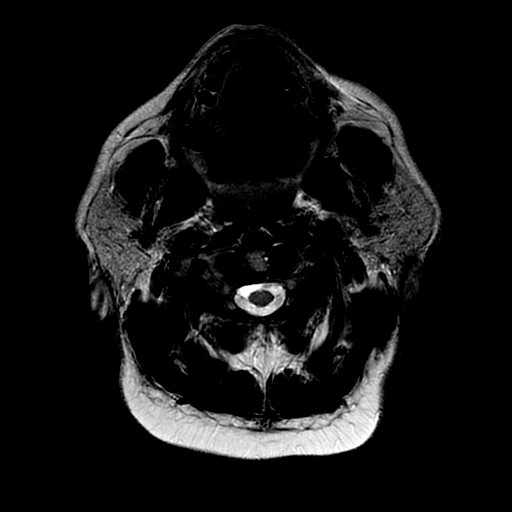

[Series 6: DWI · coronal · 4.0mm · 0.94mm/px · 2 of 72 slices shown (2 of 2)]
[im 1/72]
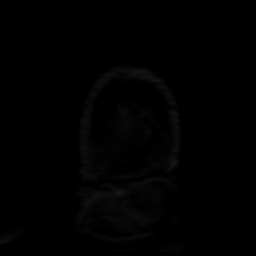
[im 72/72]
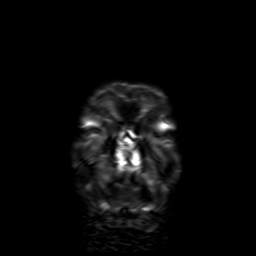

[Series 7: FLAIR · axial · 5.0mm · 0.47mm/px · 1 of 27 slices shown (2 of 2)]
[im 1/27]
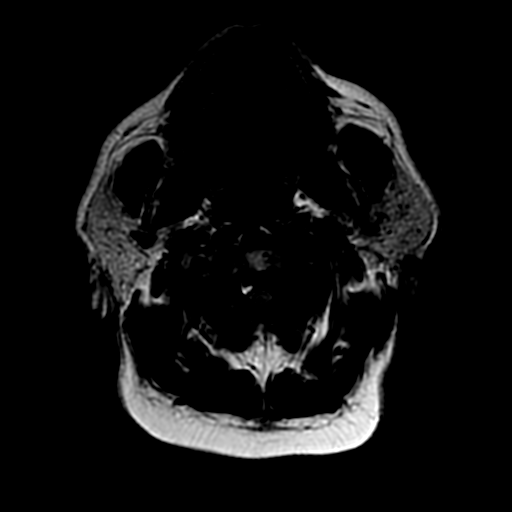

[Series 8: SWI · axial · 3.0mm · 0.47mm/px · z∈[-46,+114]mm · 4 of 108 slices shown (1 of 2)]
[im 1/108]
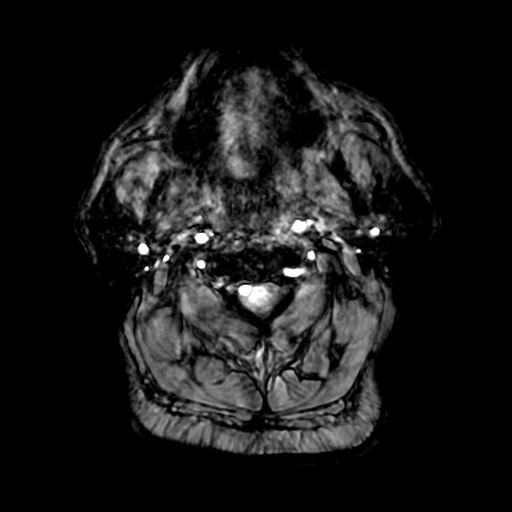
[im 36/108]
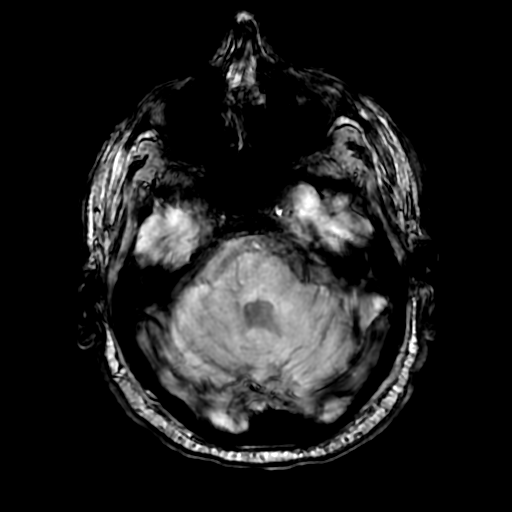
[im 72/108]
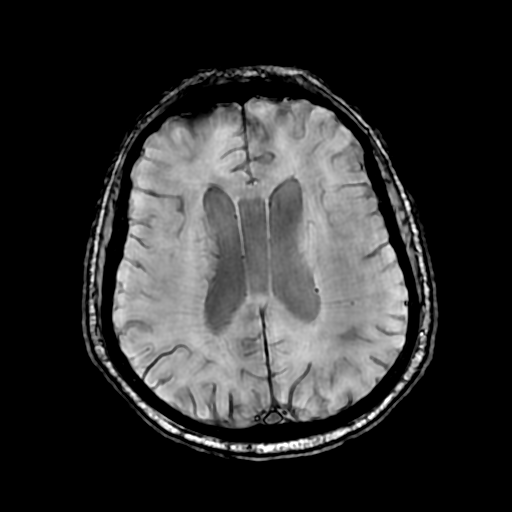
[im 108/108]
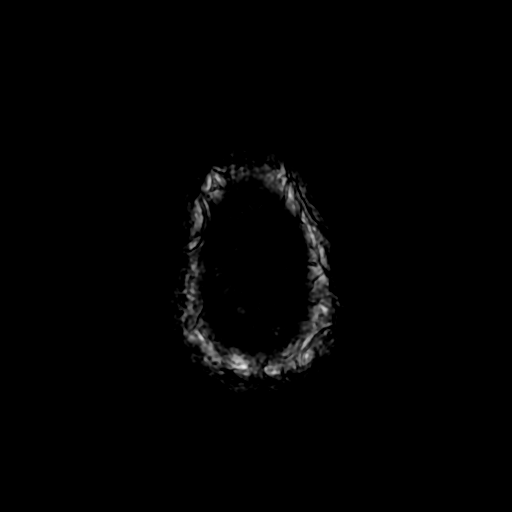

[Series 9: T2 · coronal · 5.0mm · 0.47mm/px · 1 of 30 slices shown (2 of 2)]
[im 1/30]
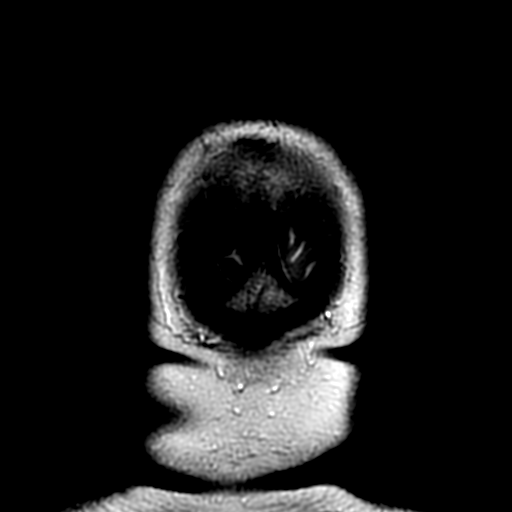

[Series 250: ADC · axial · 3.0mm · 0.94mm/px · z∈[-58,+112]mm · 2 of 57 slices shown (1 of 2)]
[im 1/57]
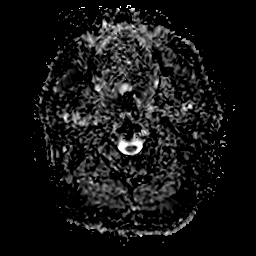
[im 57/57]
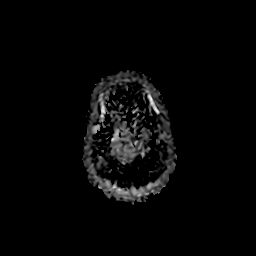

[Series 650: ADC · coronal · 4.0mm · 0.94mm/px · 1 of 36 slices shown (2 of 2)]
[im 1/36]
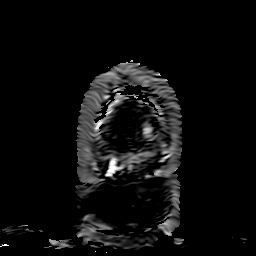

[Series 800: SWI · axial · 3.0mm · 0.47mm/px · z∈[-46,+60]mm · 3 of 108 slices shown (2 of 2)]
[im 1/108]
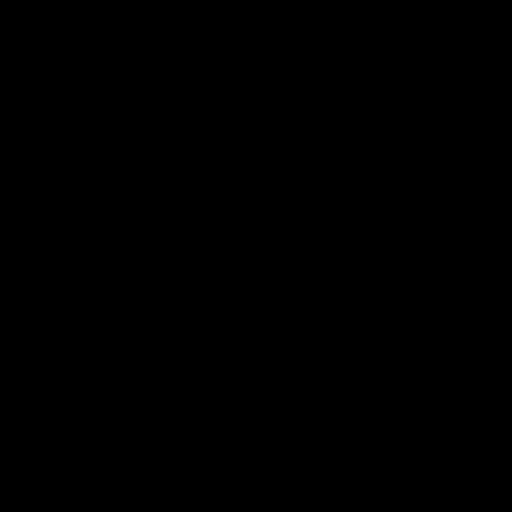
[im 36/108]
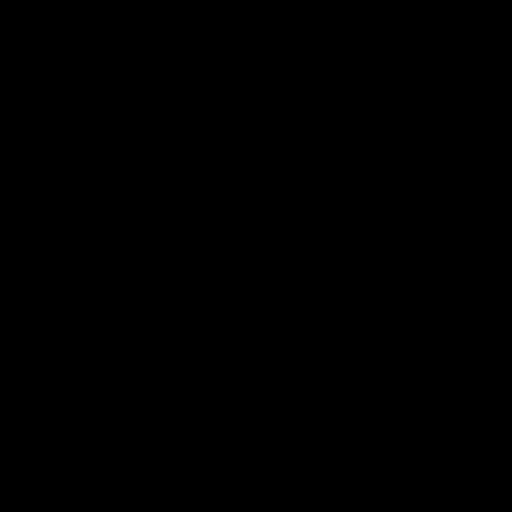
[im 72/108]
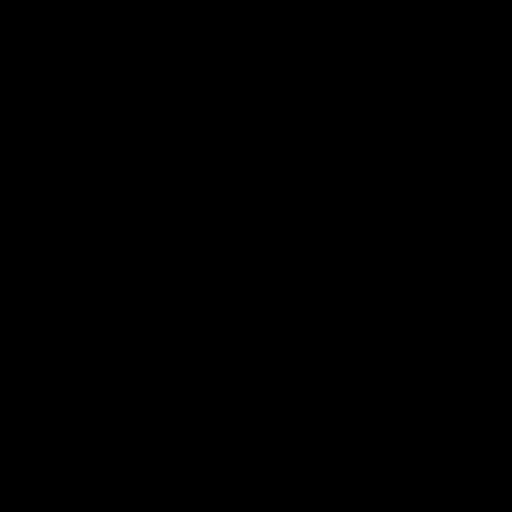

[20 of 48 positions shown; findings below may reference images not displayed]

FINDINGS: MRI HEAD FINDINGS

Brain: Diffuse prominence of the CSF containing spaces compatible
with generalized age-related cerebral atrophy. Mild scattered
T2/FLAIR hyperintensity within the periventricular and deep white
matter both cerebral hemispheres most consistent with chronic small
vessel ischemic disease, minimal for age.

4.6 cm area of confluent restricted diffusion involving the mesial
left temporal occipital region consistent with acute left PCA
territory infarct (series 2, image 28). Mild extension into the
posterior left hippocampal formation. Minimal patchy involvement of
the lateral left thalamus. No associated hemorrhage or mass effect.
No other evidence for acute or subacute ischemia. Gray-white matter
differentiation otherwise maintained. No other areas of remote
cortical infarction. No foci of susceptibility artifact to suggest
acute or chronic intracranial hemorrhage.

No mass lesion, midline shift or mass effect. Ventricles normal in
size without evidence for hydrocephalus. Cavum et septum pellucidum
noted. Pituitary gland suprasellar region within normal limits.
Midline structures intact.

Vascular: Major intracranial vascular flow voids well maintained and
normal in appearance.

Skull and upper cervical spine: Craniocervical junction within
normal limits. Degenerative disc bulging noted at C3-4 with
resultant mild-to-moderate spinal stenosis (series 3, image 19).
Bone marrow signal intensity within normal limits. No scalp soft
tissue abnormality.

Sinuses/Orbits: Globes and orbital soft tissues within normal
limits. Paranasal sinuses are clear. Trace bilateral mastoid
effusions, of doubtful significance.

Other: None.

MRA HEAD FINDINGS

ANTERIOR CIRCULATION:

Distal cervical segments of the internal carotid arteries are patent
with symmetric antegrade flow. Distal cervical left ICA tortuous.
Scattered atheromatous irregularity within the petrous, cavernous,
and supraclinoid segments bilaterally. There is a short-segment
moderate to severe stenosis involving the vertical petrous right ICA
of up to 70% by NASCET criteria (series 5, image 113). No other
hemodynamically significant stenosis. ICA termini well perfused.
Atheromatous irregularity throughout the A1 segments without
high-grade stenosis. Left A1 dominant. Normal anterior communicating
artery complex. Anterior cerebral arteries widely patent to their
distal aspects without stenosis.

Multifocal atheromatous irregularity throughout the M1 segments
bilaterally without high-grade stenosis. Normal MCA bifurcations. On
the left, multifocal severe proximal M2 stenoses involving both
superior and inferior divisions noted (series 552, image 10) no
significant stenosis seen about the proximal right M2 branches.
Distal MCA branches well perfused and symmetric bilaterally.

POSTERIOR CIRCULATION:

Right vertebral artery slightly dominant and is patent to the
vertebrobasilar junction without stenosis. Short-segment moderate
stenosis involving the distal left V4 segment just prior to the
vertebrobasilar junction (series 5, image 128). Left PICA patent
proximally. Right PICA not seen. Short-segment moderate stenosis
noted at the proximal basilar artery, just prior to the takeoff of
the anterior inferior cerebral arteries (series 5, image 123).
Basilar otherwise widely patent to its distal aspect. Superior
cerebral arteries patent bilaterally. Both of the posterior cerebral
arteries primarily supplied via the basilar. Scattered atheromatous
irregularity within the right PCA with smooth moderate narrowing of
the mid right P2 segment (series 555, image 12). Right PCA is
perfused to its distal aspect. Short-segment moderate proximal left
P2 stenosis (series 555, image 9). Distally, there is abrupt
occlusion of a proximal left P3 segment (series 555, image 8), acute
in nature given the acute left PCA territory infarct.

No intracranial aneurysm.

MRA NECK FINDINGS

Source images reviewed.

Visualized aortic arch of normal caliber with normal 3 vessel
morphology. No hemodynamically significant stenosis seen about the
origin of the great vessels. Visualized left subclavian artery
widely patent. Short-segment stenosis of up to approximately 40%
seen at the origin of the right subclavian artery (series [SZ],
image 45). Right subclavian otherwise widely patent.

Right common carotid artery widely patent from its origin to the
bifurcation without stenosis. Atheromatous irregularity about the
proximal right ICA with associated mild stenosis of up to 40% by
NASCET criteria. Right ICA mildly tortuous but otherwise patent to
the skull base without stenosis or occlusion.

Left common carotid artery patent from its origin to the bifurcation
without stenosis. Atheromatous irregularity about the origin of the
left ICA with associated mild stenosis of up to approximately 40% by
NASCET criteria. Left ICA tortuous but otherwise widely patent to
the skull base without stenosis or occlusion.

Both vertebral arteries arise from the subclavian arteries. Right
vertebral artery slightly dominant. Vertebral arteries widely patent
within the neck without stenosis or occlusion.
IMPRESSION: MRI HEAD IMPRESSION:

1. Moderate sized acute ischemic left PCA territory infarct
involving the mesial left temporal occipital region with patchy
involvement of the left thalamus as above. No associated hemorrhage
or mass effect.
2. Underlying age-related cerebral atrophy with mild chronic
microvascular ischemic disease.
3. Broad posterior disc protrusion at C3-4 with resultant
mild-to-moderate spinal stenosis. Finding could be further assessed
with dedicated MRI of the cervical spine as clinically desired.

MRA HEAD IMPRESSION:

1. Acute left P3 occlusion, corresponding with the acute left PCA
territory infarct.
2. Moderate multifocal stenoses involving the distal left V4 segment
and proximal basilar artery as above.
3. Short-segment 70% stenosis involving the vertical petrous right
ICA.
4. Multifocal severe proximal left M2 stenoses as above.
5. Moderate atherosclerotic irregularity elsewhere throughout the
intracranial circulation. No other hemodynamically significant or
correctable stenosis identified.

MRA NECK IMPRESSION:

1. Atheromatous irregularity with up to approximately 40% stenosis
about the origins of the internal carotid arteries bilaterally.
Otherwise wide patency of both carotid artery systems within the
neck.
2. Wide patency of both vertebral arteries within the neck. Right
vertebral artery slightly dominant.
3. Short-segment 40% stenosis at the origin of the right subclavian
artery.

## 2018-12-12 IMAGING — MR MR MRA NECK WO/W CM
10 of 16 series · 21 of 48 positions shown · IV contrast (gadavist)
Comparison: Prior CT from earlier the same day.

CLINICAL DATA: Initial evaluation for acute stroke.



[Series 2: DWI · axial · 3.0mm · 0.94mm/px · z∈[-58,+112]mm · 5 of 116 slices shown (1 of 2)]
[im 1/116]
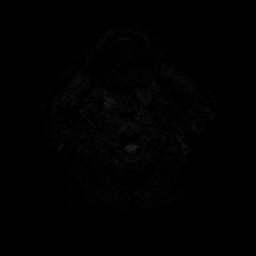
[im 29/116]
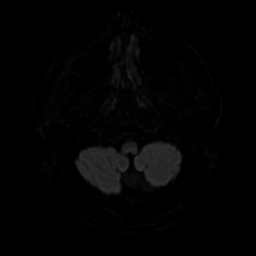
[im 58/116]
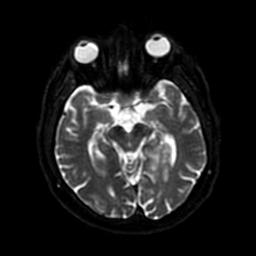
[im 87/116]
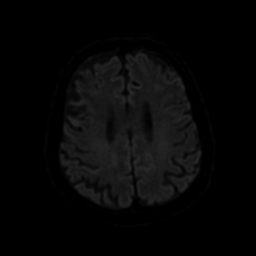
[im 116/116]
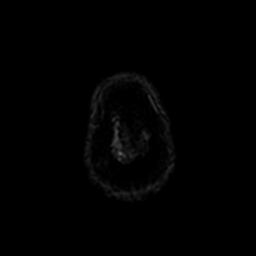

[Series 3: FLAIR · sagittal · 3.0mm · 0.47mm/px · 1 of 36 slices shown (1 of 2)]
[im 1/36]
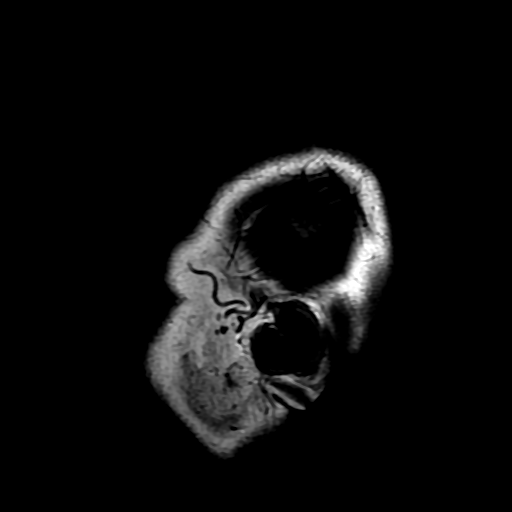

[Series 4: T2 · axial · 5.0mm · 0.47mm/px · 1 of 27 slices shown (1 of 2)]
[im 1/27]
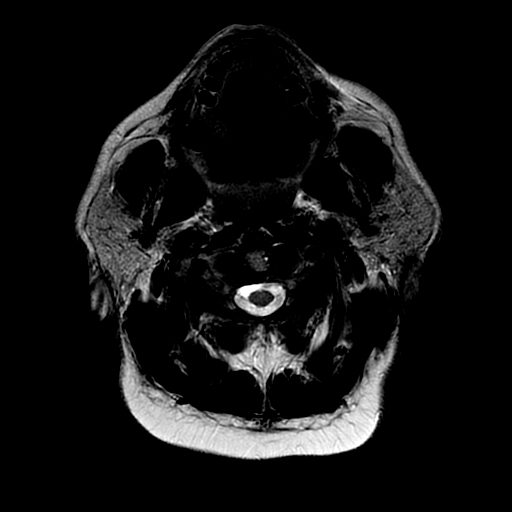

[Series 6: DWI · coronal · 4.0mm · 0.94mm/px · 2 of 72 slices shown (2 of 2)]
[im 1/72]
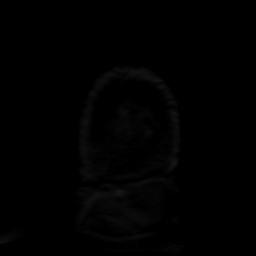
[im 72/72]
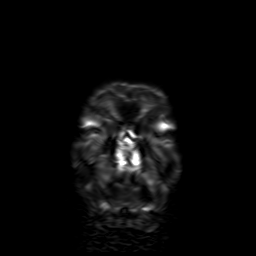

[Series 7: FLAIR · axial · 5.0mm · 0.47mm/px · 1 of 27 slices shown (2 of 2)]
[im 1/27]
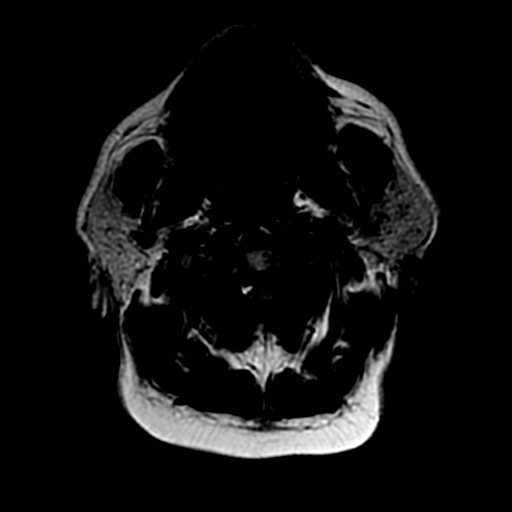

[Series 8: SWI · axial · 3.0mm · 0.47mm/px · z∈[-46,+114]mm · 4 of 108 slices shown (1 of 2)]
[im 1/108]
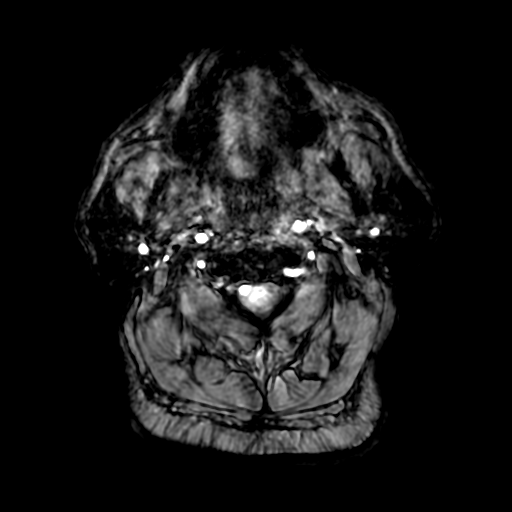
[im 36/108]
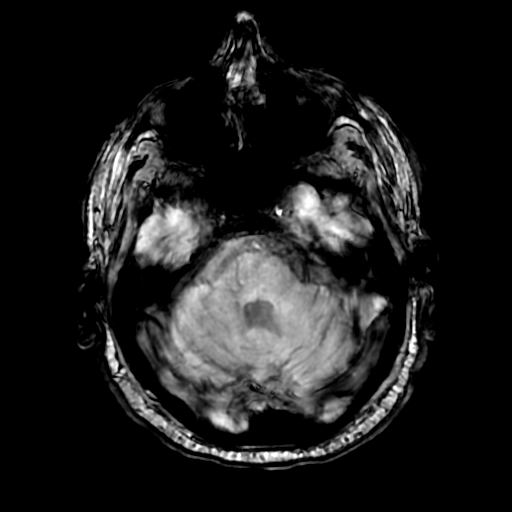
[im 72/108]
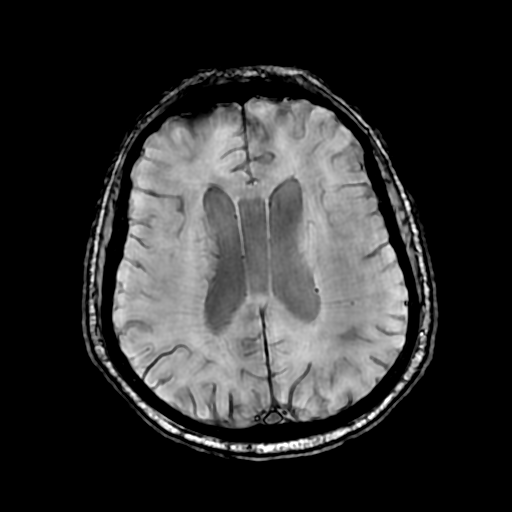
[im 108/108]
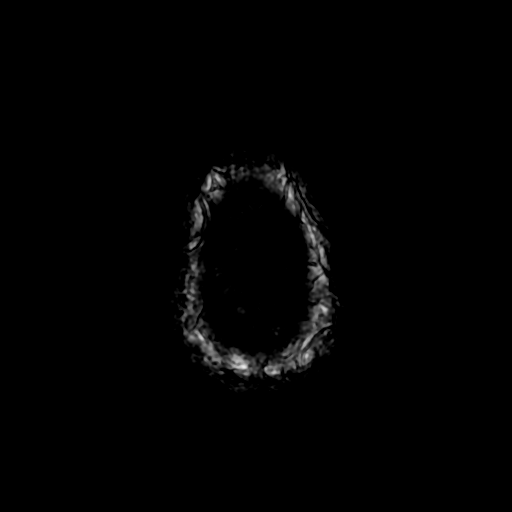

[Series 9: T2 · coronal · 5.0mm · 0.47mm/px · 1 of 30 slices shown (2 of 2)]
[im 1/30]
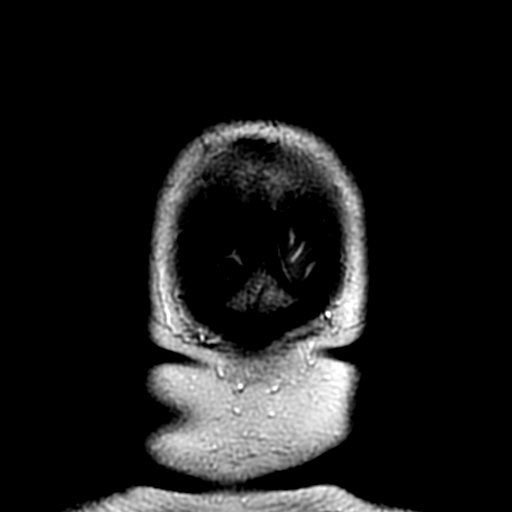

[Series 250: ADC · axial · 3.0mm · 0.94mm/px · z∈[-58,+112]mm · 2 of 57 slices shown (1 of 2)]
[im 1/57]
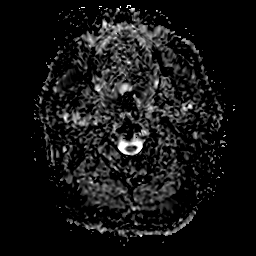
[im 57/57]
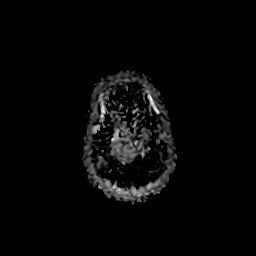

[Series 650: ADC · coronal · 4.0mm · 0.94mm/px · 1 of 36 slices shown (2 of 2)]
[im 1/36]
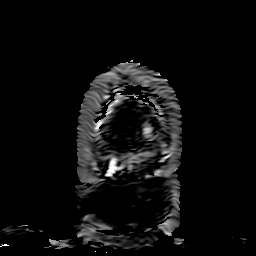

[Series 800: SWI · axial · 3.0mm · 0.47mm/px · z∈[-46,+60]mm · 3 of 108 slices shown (2 of 2)]
[im 1/108]
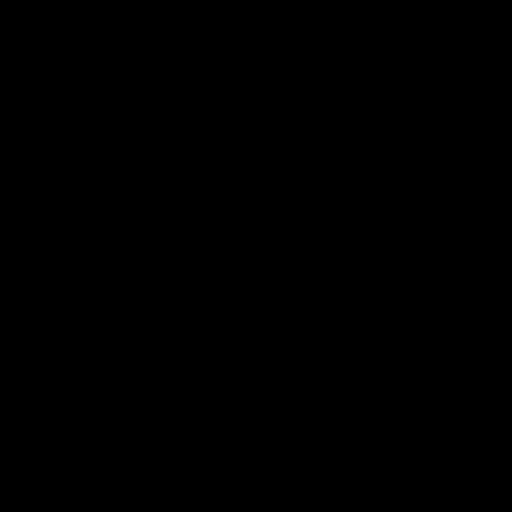
[im 36/108]
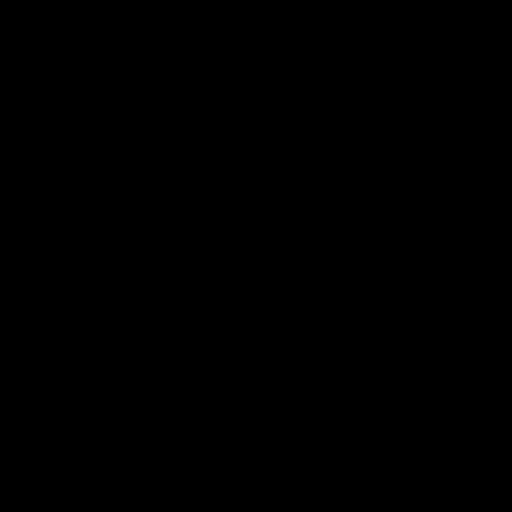
[im 72/108]
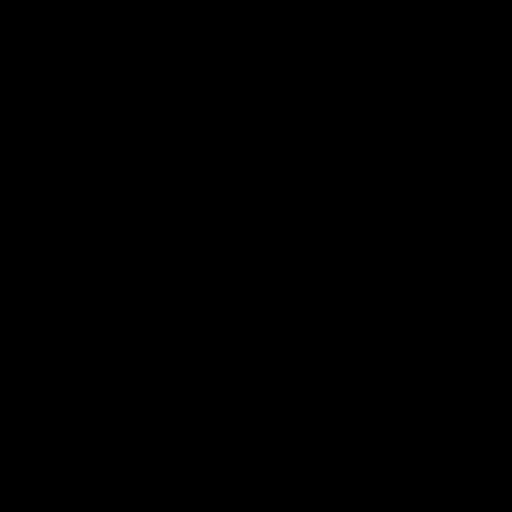

[21 of 48 positions shown; findings below may reference images not displayed]

FINDINGS: MRI HEAD FINDINGS

Brain: Diffuse prominence of the CSF containing spaces compatible
with generalized age-related cerebral atrophy. Mild scattered
T2/FLAIR hyperintensity within the periventricular and deep white
matter both cerebral hemispheres most consistent with chronic small
vessel ischemic disease, minimal for age.

4.6 cm area of confluent restricted diffusion involving the mesial
left temporal occipital region consistent with acute left PCA
territory infarct (series 2, image 28). Mild extension into the
posterior left hippocampal formation. Minimal patchy involvement of
the lateral left thalamus. No associated hemorrhage or mass effect.
No other evidence for acute or subacute ischemia. Gray-white matter
differentiation otherwise maintained. No other areas of remote
cortical infarction. No foci of susceptibility artifact to suggest
acute or chronic intracranial hemorrhage.

No mass lesion, midline shift or mass effect. Ventricles normal in
size without evidence for hydrocephalus. Cavum et septum pellucidum
noted. Pituitary gland suprasellar region within normal limits.
Midline structures intact.

Vascular: Major intracranial vascular flow voids well maintained and
normal in appearance.

Skull and upper cervical spine: Craniocervical junction within
normal limits. Degenerative disc bulging noted at C3-4 with
resultant mild-to-moderate spinal stenosis (series 3, image 19).
Bone marrow signal intensity within normal limits. No scalp soft
tissue abnormality.

Sinuses/Orbits: Globes and orbital soft tissues within normal
limits. Paranasal sinuses are clear. Trace bilateral mastoid
effusions, of doubtful significance.

Other: None.

MRA HEAD FINDINGS

ANTERIOR CIRCULATION:

Distal cervical segments of the internal carotid arteries are patent
with symmetric antegrade flow. Distal cervical left ICA tortuous.
Scattered atheromatous irregularity within the petrous, cavernous,
and supraclinoid segments bilaterally. There is a short-segment
moderate to severe stenosis involving the vertical petrous right ICA
of up to 70% by NASCET criteria (series 5, image 113). No other
hemodynamically significant stenosis. ICA termini well perfused.
Atheromatous irregularity throughout the A1 segments without
high-grade stenosis. Left A1 dominant. Normal anterior communicating
artery complex. Anterior cerebral arteries widely patent to their
distal aspects without stenosis.

Multifocal atheromatous irregularity throughout the M1 segments
bilaterally without high-grade stenosis. Normal MCA bifurcations. On
the left, multifocal severe proximal M2 stenoses involving both
superior and inferior divisions noted (series 552, image 10) no
significant stenosis seen about the proximal right M2 branches.
Distal MCA branches well perfused and symmetric bilaterally.

POSTERIOR CIRCULATION:

Right vertebral artery slightly dominant and is patent to the
vertebrobasilar junction without stenosis. Short-segment moderate
stenosis involving the distal left V4 segment just prior to the
vertebrobasilar junction (series 5, image 128). Left PICA patent
proximally. Right PICA not seen. Short-segment moderate stenosis
noted at the proximal basilar artery, just prior to the takeoff of
the anterior inferior cerebral arteries (series 5, image 123).
Basilar otherwise widely patent to its distal aspect. Superior
cerebral arteries patent bilaterally. Both of the posterior cerebral
arteries primarily supplied via the basilar. Scattered atheromatous
irregularity within the right PCA with smooth moderate narrowing of
the mid right P2 segment (series 555, image 12). Right PCA is
perfused to its distal aspect. Short-segment moderate proximal left
P2 stenosis (series 555, image 9). Distally, there is abrupt
occlusion of a proximal left P3 segment (series 555, image 8), acute
in nature given the acute left PCA territory infarct.

No intracranial aneurysm.

MRA NECK FINDINGS

Source images reviewed.

Visualized aortic arch of normal caliber with normal 3 vessel
morphology. No hemodynamically significant stenosis seen about the
origin of the great vessels. Visualized left subclavian artery
widely patent. Short-segment stenosis of up to approximately 40%
seen at the origin of the right subclavian artery (series [SZ],
image 45). Right subclavian otherwise widely patent.

Right common carotid artery widely patent from its origin to the
bifurcation without stenosis. Atheromatous irregularity about the
proximal right ICA with associated mild stenosis of up to 40% by
NASCET criteria. Right ICA mildly tortuous but otherwise patent to
the skull base without stenosis or occlusion.

Left common carotid artery patent from its origin to the bifurcation
without stenosis. Atheromatous irregularity about the origin of the
left ICA with associated mild stenosis of up to approximately 40% by
NASCET criteria. Left ICA tortuous but otherwise widely patent to
the skull base without stenosis or occlusion.

Both vertebral arteries arise from the subclavian arteries. Right
vertebral artery slightly dominant. Vertebral arteries widely patent
within the neck without stenosis or occlusion.
IMPRESSION: MRI HEAD IMPRESSION:

1. Moderate sized acute ischemic left PCA territory infarct
involving the mesial left temporal occipital region with patchy
involvement of the left thalamus as above. No associated hemorrhage
or mass effect.
2. Underlying age-related cerebral atrophy with mild chronic
microvascular ischemic disease.
3. Broad posterior disc protrusion at C3-4 with resultant
mild-to-moderate spinal stenosis. Finding could be further assessed
with dedicated MRI of the cervical spine as clinically desired.

MRA HEAD IMPRESSION:

1. Acute left P3 occlusion, corresponding with the acute left PCA
territory infarct.
2. Moderate multifocal stenoses involving the distal left V4 segment
and proximal basilar artery as above.
3. Short-segment 70% stenosis involving the vertical petrous right
ICA.
4. Multifocal severe proximal left M2 stenoses as above.
5. Moderate atherosclerotic irregularity elsewhere throughout the
intracranial circulation. No other hemodynamically significant or
correctable stenosis identified.

MRA NECK IMPRESSION:

1. Atheromatous irregularity with up to approximately 40% stenosis
about the origins of the internal carotid arteries bilaterally.
Otherwise wide patency of both carotid artery systems within the
neck.
2. Wide patency of both vertebral arteries within the neck. Right
vertebral artery slightly dominant.
3. Short-segment 40% stenosis at the origin of the right subclavian
artery.

## 2018-12-12 IMAGING — CT CT HEAD W/O CM
4 series · 15 of 47 positions shown, 17 images · non-contrast
Comparison: Head CT [DATE]

CLINICAL DATA: Stroke, follow-up.

EXAM:
CT HEAD WITHOUT CONTRAST
TECHNIQUE: Contiguous axial images were obtained from the base of the skull
through the vertex without intravenous contrast.

[Series 3: head wo · axial · 0.43mm/px · z∈[-128,-8]mm · 7 of 34 slices shown, 9 images]
[im 5/34  brain]
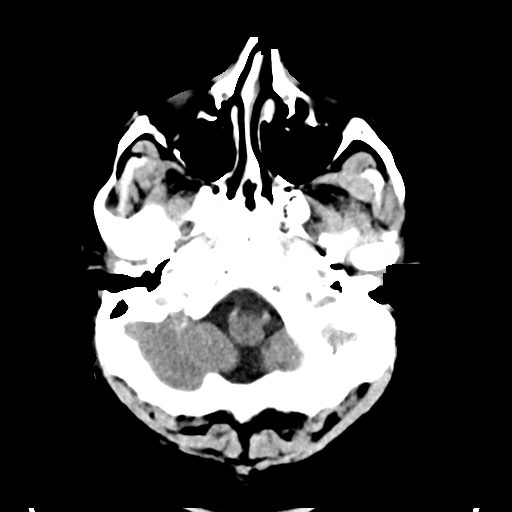
[im 5/34  bone]
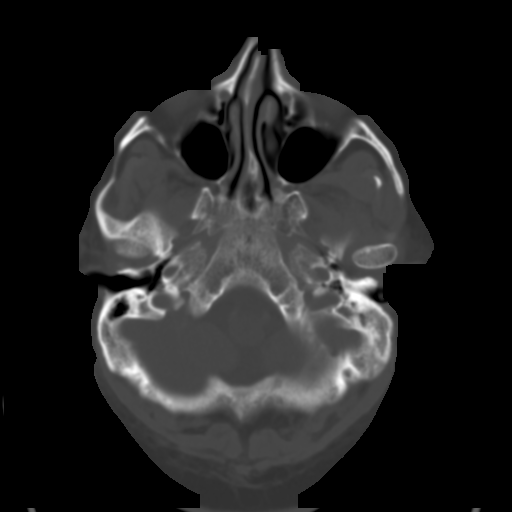
[im 9/34  brain]
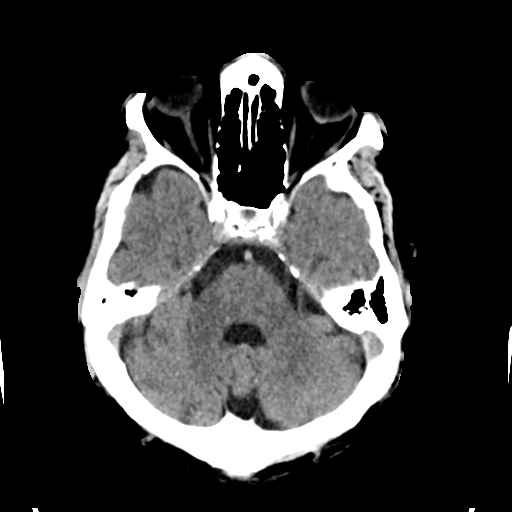
[im 13/34  brain]
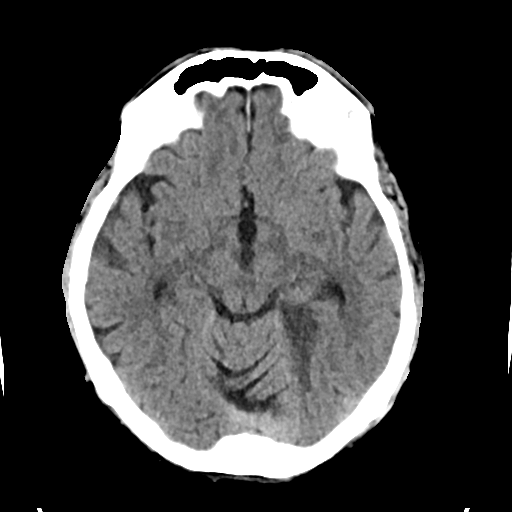
[im 17/34  brain]
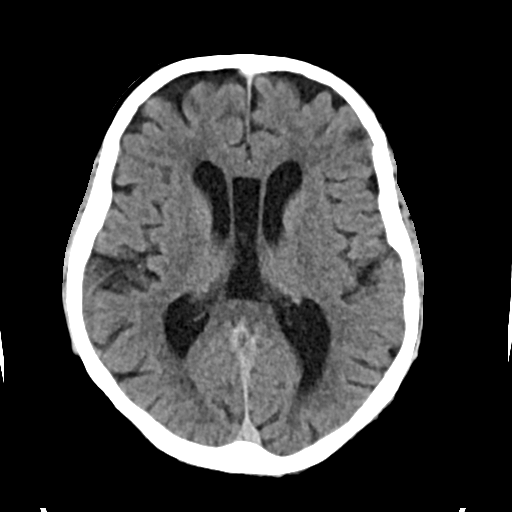
[im 21/34  brain]
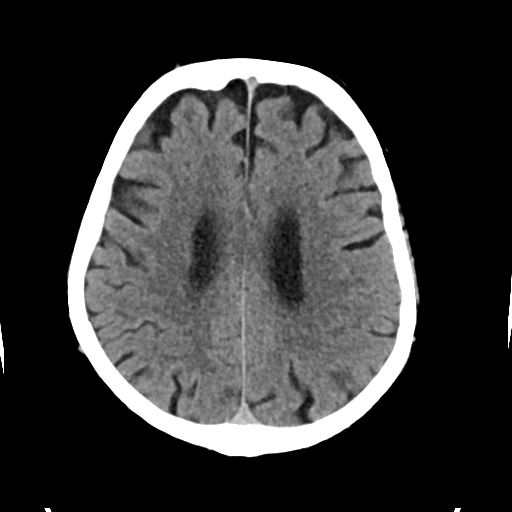
[im 21/34  bone]
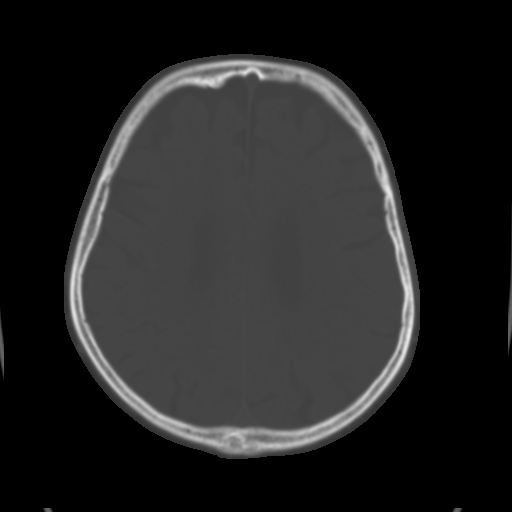
[im 25/34  brain]
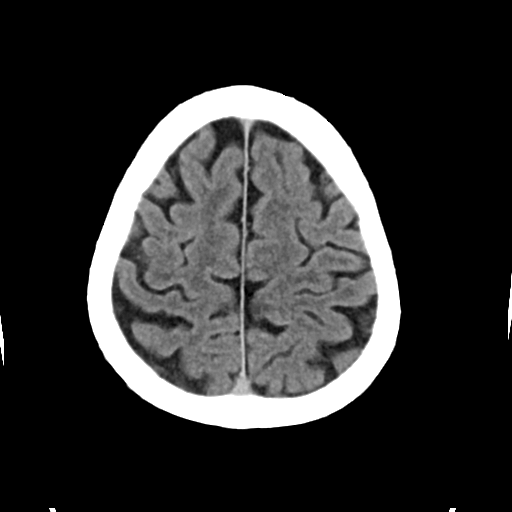
[im 29/34  brain]
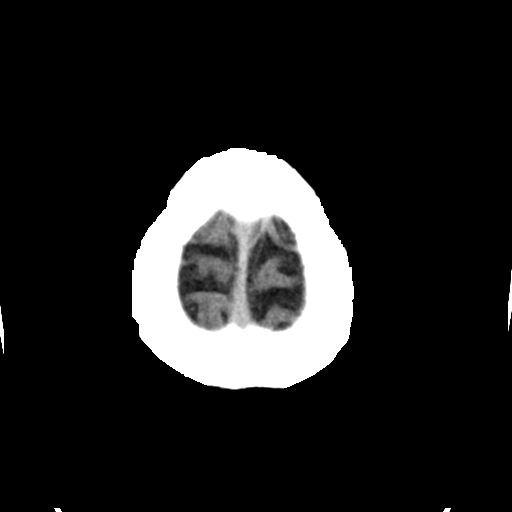

[Series 4: head bone · axial · 0.43mm/px · z∈[-132,-116]mm · 2 of 83 slices shown]
[im 9/83  bone]
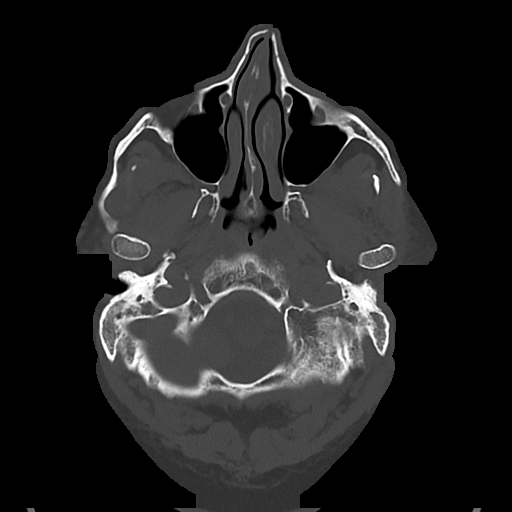
[im 17/83  bone]
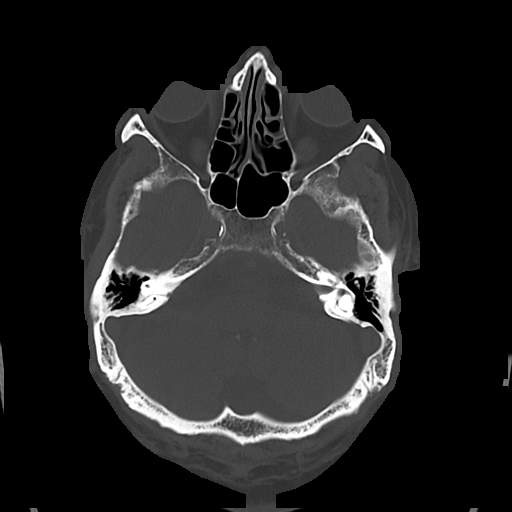

[Series 5: cor soft · coronal · 0.31mm/px · 3 of 75 slices shown]
[im 25/75  brain]
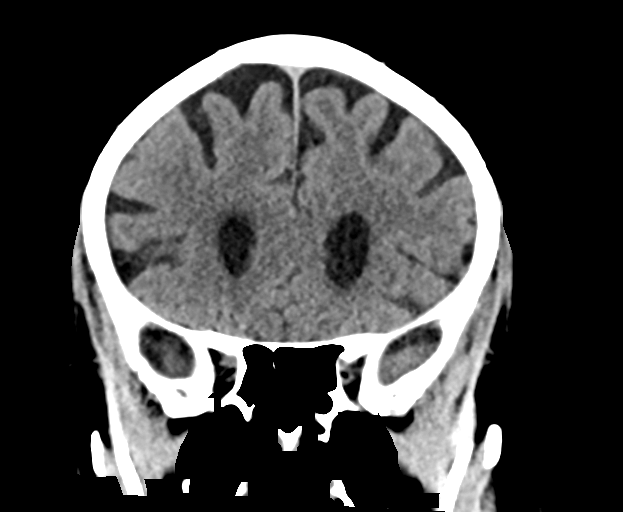
[im 33/75  brain]
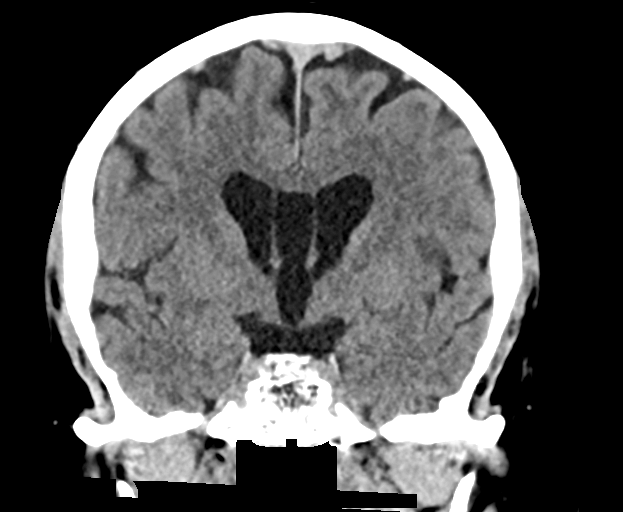
[im 42/75  brain]
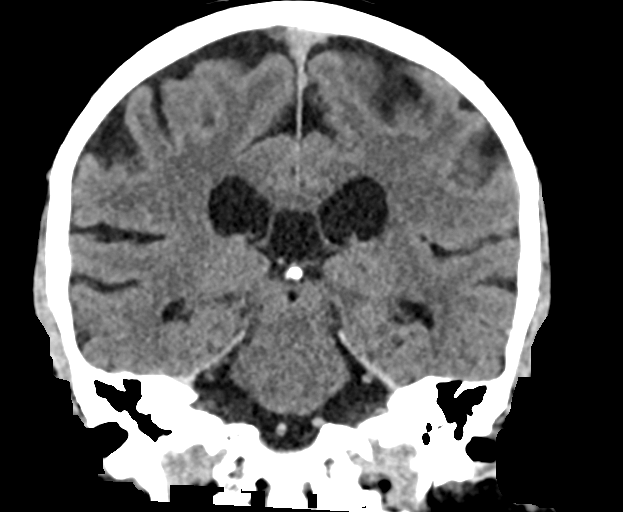

[Series 6: sag soft · sagittal · 0.31mm/px · 3 of 64 slices shown]
[im 22/64  brain]
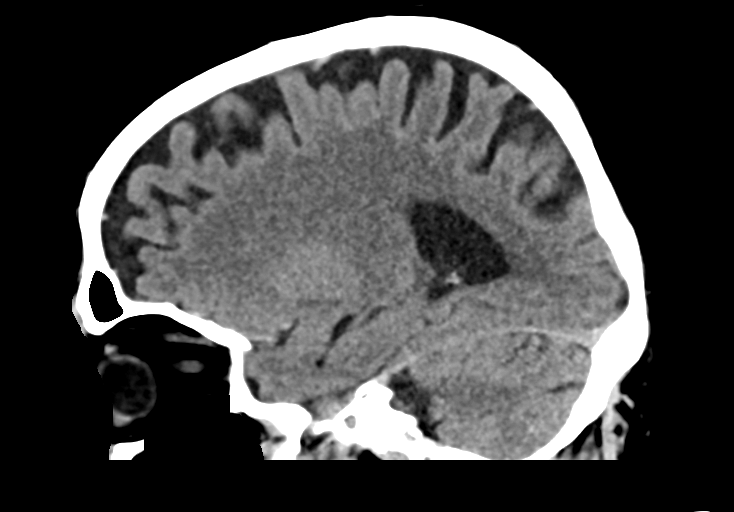
[im 32/64  brain]
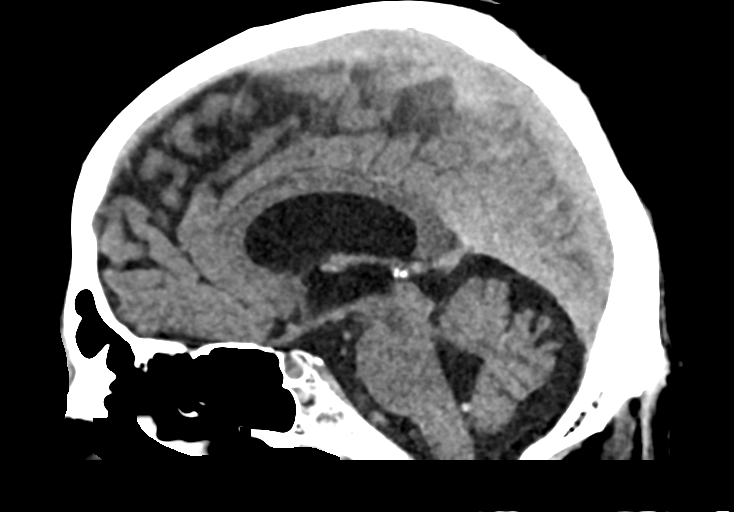
[im 43/64  brain]
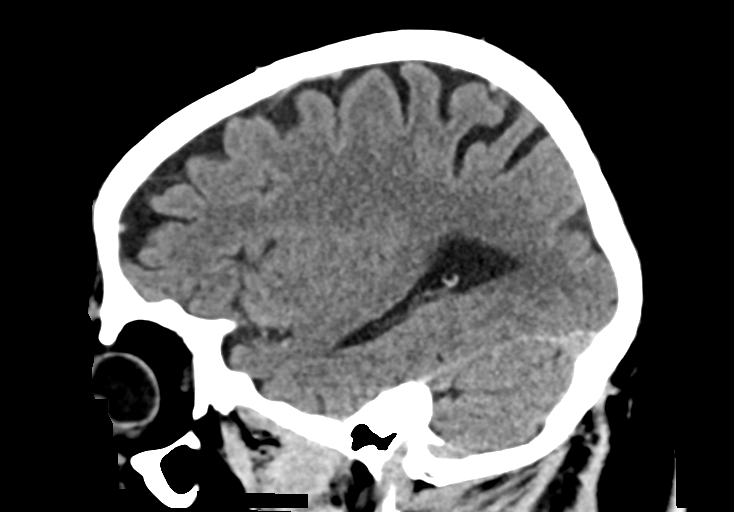

[15 of 47 positions shown; findings below may reference images not displayed]

FINDINGS: Brain: There is a 3.7 x 2.0 cm region of parenchymal hypodensity
within the medial left temporal occipital lobes (series 3, image 14)
consistent with a posterior cerebral artery territory acute/subacute
infarction. There is no acute intracranial hemorrhage. No midline
shift or extra-axial fluid collection. Mild ill-defined
hypoattenuation of the cerebral white matter is nonspecific, but
consistent with chronic small vessel ischemic disease. Incidentally
noted cavum septum pellucidum. Mild generalized parenchymal atrophy.

Vascular: No hyperdense vessel. Atherosclerotic calcification of the
carotid artery siphons and vertebrobasilar system.

Skull: No calvarial fracture.

Sinuses/Orbits: Mild scattered paranasal sinus mucosal thickening.
No significant mastoid effusion.

These results were called by telephone at the time of interpretation
on [DATE] at [DATE] to Dr. GARZON , who verbally
acknowledged these results.
IMPRESSION: 3.7 x 2.0 cm hypodensity within the medial left temporal occipital
lobe, within the left posterior cerebral artery vascular territory,
consistent with acute/subacute infarct.

No acute intracranial hemorrhage or midline shift.

Mild generalized parenchymal atrophy and chronic small vessel
ischemic disease.

## 2018-12-12 MED ORDER — ACETAMINOPHEN 160 MG/5ML PO SOLN: 650.0000 mg | ORAL | Status: DC | PRN

## 2018-12-12 MED ORDER — APIXABAN 5 MG PO TABS
5.0000 mg | ORAL_TABLET | Freq: Two times a day (BID) | ORAL | Status: DC
  Administered 2018-12-12 – 2018-12-13 (×2): 5 mg via ORAL
  Filled 2018-12-12 (×3): qty 1

## 2018-12-12 MED ORDER — SENNOSIDES-DOCUSATE SODIUM 8.6-50 MG PO TABS: 1.0000 | ORAL_TABLET | Freq: Every evening | ORAL | Status: DC | PRN

## 2018-12-12 MED ORDER — SODIUM CHLORIDE 0.9% FLUSH: 3.0000 mL | Freq: Once | INTRAVENOUS | Status: DC

## 2018-12-12 MED ORDER — INSULIN GLARGINE 100 UNIT/ML ~~LOC~~ SOLN
20.0000 [IU] | Freq: Every evening | SUBCUTANEOUS | Status: DC
  Administered 2018-12-12: 18:00:00 20 [IU] via SUBCUTANEOUS
  Filled 2018-12-12 (×2): qty 0.2

## 2018-12-12 MED ORDER — ACETAMINOPHEN 650 MG RE SUPP: 650.0000 mg | RECTAL | Status: DC | PRN

## 2018-12-12 MED ORDER — ALPRAZOLAM 0.5 MG PO TABS: ORAL_TABLET | ORAL | 0 refills | Status: DC

## 2018-12-12 MED ORDER — LORAZEPAM 2 MG/ML IJ SOLN
0.5000 mg | Freq: Once | INTRAMUSCULAR | Status: AC | PRN
  Administered 2018-12-12: 18:00:00 0.5 mg via INTRAVENOUS
  Filled 2018-12-12: qty 1

## 2018-12-12 MED ORDER — STROKE: EARLY STAGES OF RECOVERY BOOK
Freq: Once | Status: AC
  Administered 2018-12-12: 16:00:00
  Filled 2018-12-12: qty 1

## 2018-12-12 MED ORDER — INSULIN ASPART 100 UNIT/ML ~~LOC~~ SOLN
0.0000 [IU] | Freq: Three times a day (TID) | SUBCUTANEOUS | Status: DC
  Administered 2018-12-13 (×2): 2 [IU] via SUBCUTANEOUS

## 2018-12-12 MED ORDER — METOPROLOL TARTRATE 50 MG PO TABS
50.0000 mg | ORAL_TABLET | Freq: Two times a day (BID) | ORAL | Status: DC
  Administered 2018-12-12 – 2018-12-13 (×2): 50 mg via ORAL
  Filled 2018-12-12 (×2): qty 1

## 2018-12-12 MED ORDER — GADOBUTROL 1 MMOL/ML IV SOLN
8.0000 mL | Freq: Once | INTRAVENOUS | Status: AC | PRN
  Administered 2018-12-12: 19:00:00 8 mL via INTRAVENOUS

## 2018-12-12 MED ORDER — SODIUM CHLORIDE 0.9 % IV SOLN
INTRAVENOUS | Status: DC
  Administered 2018-12-12: 16:00:00 via INTRAVENOUS

## 2018-12-12 MED ORDER — ACETAMINOPHEN 325 MG PO TABS
650.0000 mg | ORAL_TABLET | ORAL | Status: DC | PRN
  Administered 2018-12-12: 650 mg via ORAL
  Filled 2018-12-12: qty 2

## 2018-12-12 MED ORDER — LOSARTAN POTASSIUM 50 MG PO TABS
100.0000 mg | ORAL_TABLET | Freq: Every day | ORAL | Status: DC
  Administered 2018-12-12 – 2018-12-13 (×2): 100 mg via ORAL
  Filled 2018-12-12 (×2): qty 2

## 2018-12-12 NOTE — Progress Notes (Signed)
Reason for visit: Right leg weakness  Referring physician: Dr. Carlynn PurlXu  [name] [name] is a 68 y.o. male  History of present illness:  Mr. Vernon Ruiz is a 68 year old right-handed male with a relatively painless onset of a right foot drop that began in May 2020.  The patient does recall some low back pain however around the time of onset of symptoms that was on the right side.  The patient denied any pain shooting down the leg.  He was set up for an EMG and nerve conduction study that showed denervation with some demyelinating features in the posterior tibial nerve as well as the peroneal nerve without clear evidence of an overlying lumbosacral radiculopathy.  The patient is wearing an AFO brace, he has not had any falls.  He reports no significant neck pain or pain down the arms.  He does have some urinary urgency without incontinence.  He reports no numbness of the extremities.  He does have a history of diabetes.  He also presents with another problem today, he claims that last evening he had onset of a severe occipital headache which is very unusual for him.  He still has a mild headache this morning, but he felt somewhat confused, he was getting lost trying to get to this office.  He reports no focal numbness or weakness of the extremities.  He has no visual changes.  Past Medical History:  Diagnosis Date  . Aortic stenosis    s/p AVR using a 23mm Edwards Magna-Ease pericardial valve 10/04/16  . Coronary artery disease   . Diabetes mellitus without complication (HCC)   . GERD (gastroesophageal reflux disease)   . Heart murmur   . Hypertension   . Obstructive sleep apnea    positive from a home test, still needs to get another study done  . Persistent atrial fibrillation    s/p clipping of LA appendage 10/04/16  . Right foot drop 11/07/2018    Past Surgical History:  Procedure Laterality Date  . AORTIC VALVE REPLACEMENT N/A 10/04/2016   Procedure: AORTIC VALVE REPLACEMENT (AVR);  Surgeon:  Alleen BorneBartle, Bryan K, MD;  [facility];  Service: Open Heart Surgery;  Laterality: N/A;  . APPENDECTOMY    . CARDIAC CATHETERIZATION N/A 03/10/2016   Procedure: Right/Left Heart Cath and Coronary Angiography;  Surgeon: Tonny BollmanMichael Cooper, MD;  [facility];  Service: Cardiovascular;  Laterality: N/A;  . CLIPPING OF ATRIAL APPENDAGE N/A 10/04/2016   Procedure: CLIPPING OF ATRIAL APPENDAGE;  Surgeon: Alleen BorneBartle, Bryan K, MD;  Location: [facility] OR;  Service: Open Heart Surgery;  Laterality: N/A;  . COLONOSCOPY    . EYE SURGERY Right    growth removed from eye  . HERNIA REPAIR Bilateral    inguinal  . TEE WITHOUT CARDIOVERSION N/A 10/04/2016   Procedure: TRANSESOPHAGEAL ECHOCARDIOGRAM (TEE);  Surgeon: Alleen BorneBartle, Bryan K, MD;  [facility];  Service: Open Heart Surgery;  Laterality: N/A;    History reviewed. No pertinent family history.  Social history:  reports that he quit smoking about 2 years ago. His smoking use included cigars. He has never used smokeless tobacco. He reports current alcohol use. He reports that he does not use drugs.  Medications:  Prior to Admission medications   Medication Sig Start Date End Date Taking? Authorizing Provider  acetaminophen (TYLENOL) 325 MG tablet Take 2 tablets (650 mg total) by mouth every 6 (six) hours as needed for mild pain. 10/08/16  Yes Doree FudgeZimmerman, Donielle M, PA-C  apixaban (  ELIQUIS) 5 MG TABS tablet Take 5 mg by mouth 2 (two) times daily.   Yes [provider]  aspirin EC 81 MG tablet Take 1 tablet (81 mg total) by mouth daily. 10/09/16  Yes Doree Fudge M, PA-C  Cyanocobalamin (VITAMIN B 12 PO) Take 1 tablet by mouth every other day.    Yes [provider]  Dulaglutide (TRULICITY) 1.5 MG/0.5ML SOPN Trulicity 1.5 mg/0.5 mL subcutaneous pen injector  INJECT THE CONTENTS OF 1 PEN INTO THE SKIN ONCE WEEKLY   Yes [provider]  glimepiride (AMARYL) 4 MG tablet Take 2 tablets by mouth daily. 10/15/18  Yes [provider]  Insulin Glargine (TOUJEO SOLOSTAR Bee) Inject 30 Units into the skin every evening.    Yes [provider]  losartan (COZAAR) 100 MG tablet Take 1 tablet (100 mg total) by mouth daily. 07/11/18 07/06/19 Yes [name], MD  metFORMIN (GLUCOPHAGE) 1000 MG tablet Take 1,000 mg by mouth 2 (two) times daily.  06/14/16  Yes [provider]  metoprolol tartrate (LOPRESSOR) 50 MG tablet Take 50 mg by mouth 2 (two) times daily.   Yes [provider]  amLODipine (NORVASC) 5 MG tablet Take 1 tablet (5 mg total) by mouth daily. Patient not taking: Reported on 12/12/2018 12/11/18 12/06/19  [name], MD      Allergies  Allergen Reactions  . Penicillins Itching and Rash    All over body  Has patient had a PCN reaction causing immediate rash, facial/tongue/throat swelling, SOB or lightheadedness with hypotension: yes Has patient had a PCN reaction causing severe rash involving mucus membranes or skin necrosis: no Has patient had a PCN reaction that required hospitalization no Has patient had a PCN reaction occurring within the last 10 years: no If all of the above answers are "NO", then may proceed with Cephalosporin use.  . Sulfa Antibiotics Rash    Rash  . Sulfamethoxazole Rash    ROS:  Out of a complete 14 system review of symptoms, the patient complains only of the following symptoms, and all other reviewed systems are negative.  Headache Slight confusion Weakness  Blood pressure (!) 161/82, pulse 68, temperature (!) 97.3 F (36.3 C), temperature source Temporal, height 5\' 5"  (1.651 m), weight 208 lb 2 oz (94.4 kg).  Physical Exam  General: The patient is alert and cooperative at the time of the examination.  The patient is moderately obese.  Eyes: Pupils are equal, round, and reactive to light. Discs are flat bilaterally.  Neck: The neck is supple, a right carotid bruit is noted.  Respiratory: The respiratory examination is  clear.  Cardiovascular: The cardiovascular examination reveals a regular rate and rhythm, no obvious murmurs or rubs are noted.  Skin: Extremities are with 1+ edema at the right ankle.  Upon observation the patient, there appears to be fasciculations in the thighs bilaterally, and in the right forearm and biceps muscle.  Neurologic Exam  Mental status: The patient is alert and oriented x 3 at the time of the examination. The patient has apparent normal recent and remote memory, with an apparently normal attention span and concentration ability.  Cranial nerves: Facial symmetry is present. There is good sensation of the face to pinprick and soft touch bilaterally. The strength of the facial muscles and the muscles to head turning and shoulder shrug are normal bilaterally. Speech is well enunciated, no aphasia or dysarthria is noted. Extraocular movements are full. Visual fields are full. The tongue is midline, and  the patient has symmetric elevation of the soft palate. No obvious hearing deficits are noted.  Motor: The motor testing reveals 5 over 5 strength of the left extremities.  On the right side, the patient has 4/5 strength with hip flexion and with hamstring strength, he has weakness with foot drop and with inversion and eversion of the right foot.  With the right arm, he has weakness of the right biceps muscle in the right deltoid muscle and with external rotation of the right arm.  Good symmetric motor tone is noted throughout.  Sensory: Sensory testing is intact to pinprick, soft touch, vibration sensation, and position sense on all 4 extremities, but the patient does have a stocking pattern pinprick sensory deficit up to the knees bilaterally. No evidence of extinction is noted.  Coordination: Cerebellar testing reveals good finger-nose-finger and heel-to-shin bilaterally.  Gait and station: Gait is wide-based. Tandem gait is slightly unsteady. Romberg is negative. No drift is  seen.  Reflexes: Deep tendon reflexes are symmetric, but appear to be somewhat brisk at the knees bilaterally, the ankle jerk reflexes are well-maintained bilaterally. Toes are downgoing bilaterally.   Assessment/Plan:  1.  Right-sided weakness  2.  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  Fasciculations  4.  New onset headache, slight confusion  5.  Right carotid bruit  The patient has had a carotid Doppler study done in 2018 that was unremarkable.  The right carotid bruit may be from the external carotid artery.  The patient however is had a new onset headache and some slight confusion although the clinical examination otherwise is unrevealing, the patient plans on going to the emergency room for an evaluation which is reasonable to exclude a stroke event.  The patient appears to have fasciculations and muscle weakness and primarily the right arm and right leg with some elevated reflexes in the legs.  A cervical myelopathy or an anterior horn cell disease process such as ALS does need to be considered.  MRI of the cervical spine and lumbar spine will be done.  EMG will be done on the right arm with nerve conductions on both arms.  Further blood work will be done today.  He will follow-up in 3 months otherwise.  Jill Alexanders MD 12/12/2018 9:49 AM  [facility] [address] [place], [place] [address]  Phone [phone] Fax [phone]

## 2018-12-12 NOTE — ED Triage Notes (Signed)
Pt reports yesterday evening developed moderate aching right sided headache. States at that time felt normal other than the headache. Woke up feeling normal this morning, reports heaviness in his head. Was on the way to see neurologist about drop foot became confused and ended up getting lost on the way to his appt but made it there safely. Pt alert, oriented x4, VSS, no droop, drift, slurred speech, VAN negative.

## 2018-12-12 NOTE — Consult Note (Signed)
Stroke Consult   Ref MD : Gareth Morgan Chief Complaint: headache and confusion HPI: Vernon Ruiz is an 68 y.o. male with past medical history of aortic stenosis status post post aortic valve repair with a pig valve, coronary artery disease, diabetes, gastroesophageal reflux disease, hypertension and obstructive sleep apnea and chronic atrial fibrillation status post clipping of left atrial appendage in June 2018 and chronic right foot drop who developed sudden onset of right-sided headache and heaviness which is unusual for him.  The headache began around 7 PM but his family reports that he came home from work around lunchtime and he was not his usual self and had some increased difficulty walking.  He also had trouble remembering things.  Patient states that he went to sleep and woke up a couple of times in the night and went to the restroom and this morning he did not have a headache.  He had an appointment to see Dr. Jannifer Franklin and had trouble finding his way to his clinic but eventually he made it there.  He called his daughter whose husband Dr. Ellin Goodie asked him to come to the ER for evaluation.  I met the patient in the ER he stated he was not having any headaches but still felt he had some trouble with his memory.  He has been on Eliquis and states he did not miss any dose.  He denies any fall, head injury or trouble with vision or speech.  He has no prior history of strokes TIAs or seizures.  He sees Dr. Jannifer Franklin in office for right foot drop which is felt to be related to peroneal neuropathy.  Notably saw him today and recommended MRI scan of the cervical and lumbar spine as well as EMG nerve conduction study of the upper extremities.  LSN: Lunchtime 12/11/2018 tPA Given: No: outside time window and no deficits Stat CT scan of the head was obtained in the emergency room and personally reviewed by me shows no acute bleed but does show low density in the posterior medial left temporal lobe compatible  with an acute left PCA branch infarct Past Medical History:  Diagnosis Date  . Aortic stenosis    s/p AVR using a 39m Edwards Magna-Ease pericardial valve 10/04/16  . Coronary artery disease   . Diabetes mellitus without complication ([facility]   . GERD (gastroesophageal reflux disease)   . Heart murmur   . Hypertension   . Obstructive sleep apnea    positive from a home test, still needs to get another study done  . Persistent atrial fibrillation    s/p clipping of LA appendage 10/04/16  . Right carotid bruit 12/12/2018  . Right foot drop 11/07/2018    Past Surgical History:  Procedure Laterality Date  . AORTIC VALVE REPLACEMENT N/A 10/04/2016   Procedure: AORTIC VALVE REPLACEMENT (AVR);  Surgeon: BGaye Pollack MD;  [place] [facility]  Service: Open Heart Surgery;  Laterality: N/A;  . APPENDECTOMY    . CARDIAC CATHETERIZATION N/A 03/10/2016   Procedure: Right/Left Heart Cath and Coronary Angiography;  Surgeon: MSherren Mocha MD;  [facility];  Service: Cardiovascular;  Laterality: N/A;  . CLIPPING OF ATRIAL APPENDAGE N/A 10/04/2016   Procedure: CLIPPING OF ATRIAL APPENDAGE;  Surgeon: BGaye Pollack MD;  [facility]  Service: Open Heart Surgery;  Laterality: N/A;  . COLONOSCOPY    . EYE SURGERY Right    growth removed from eye  . HERNIA REPAIR Bilateral  inguinal  . TEE WITHOUT CARDIOVERSION N/A 10/04/2016   Procedure: TRANSESOPHAGEAL ECHOCARDIOGRAM (TEE);  Surgeon: Gaye Pollack, MD;  Location: [place];  Service: Open Heart Surgery;  Laterality: N/A;    History reviewed. No pertinent family history. Social History:  reports that he quit smoking about 2 years ago. His smoking use included cigars. He has never used smokeless tobacco. He reports current alcohol use. He reports that he does not use drugs.  Allergies:  Allergies  Allergen Reactions  . Penicillins Itching and Rash    All over body  Has patient had a PCN reaction causing immediate rash,  facial/tongue/throat swelling, SOB or lightheadedness with hypotension: yes Has patient had a PCN reaction causing severe rash involving mucus membranes or skin necrosis: no Has patient had a PCN reaction that required hospitalization no Has patient had a PCN reaction occurring within the last 10 years: no If all of the above answers are "NO", then may proceed with Cephalosporin use.  . Sulfa Antibiotics Rash    Rash  . Sulfamethoxazole Rash    (Not in a hospital admission)   ROS: 14 system review of systems is positive for headache, confusion, disorientation, memory difficulties, foot drop, leg weakness, gait difficulties and all other systems negative  Physical Examination: Blood pressure (!) 154/82, pulse 64, temperature 99.5 F (37.5 C), temperature source Oral, resp. rate 18, height '5\' 5"'  (1.651 m), weight 84.4 kg, SpO2 97 %.   . Afebrile. Head is nontraumatic. Neck is supple without bruit.    Cardiac exam soft ejection murmur.  No gallop. Lungs are clear to auscultation. Distal pulses are well felt.  Neurologic Examination: He is awake alert oriented to time place and person.  He has diminished registration and recall.  3 word recall is 1/3.  He is able to recall the last 5 [place] presidents with slight difficulty.  Speech is fluent without aphasia, dysarthria or apraxia.  Extraocular movements are full range without nystagmus.  Visual fields seem almost full to confrontation with only slight limitation in the right upper quadrant of the right eye.  Face is symmetric.  Tongue midline.  Motor system exam reveals symmetric upper extremity strength without focal weakness.  He has mild weakness in the right leg 4/5 strength of the hip flexors and hamstrings and right foot drop with only 2/5 ankle dorsiflexor normal plantar flexor strength.  There is mild weakness of evertors in the right leg only.  He has slight decreased touch pinprick sensation below the knees right leg more than  left.  Position vibration sense are preserved.  Deep tendon reflexes 1+ symmetric no ankle jerks are slightly depressed bilaterally.  Gait is slightly wide-based and unsteady. NIHSS 1 MRS baseline 1 Results for orders placed or performed during the hospital encounter of 12/12/18 (from the past 48 hour(s))  Comprehensive metabolic panel     Status: Abnormal   Collection Time: 12/12/18 11:48 AM  Result Value Ref Range   Sodium 137 135 - 145 mmol/L   Potassium 4.3 3.5 - 5.1 mmol/L   Chloride 101 98 - 111 mmol/L   CO2 22 22 - 32 mmol/L   Glucose, Bld 140 (H) 70 - 99 mg/dL   BUN 20 8 - 23 mg/dL   Creatinine, Ser 0.97 0.61 - 1.24 mg/dL   Calcium 9.6 8.9 - 10.3 mg/dL   Total Protein 7.2 6.5 - 8.1 g/dL   Albumin 4.1 3.5 - 5.0 g/dL   AST 28 15 - 41 U/L  ALT 26 0 - 44 U/L   Alkaline Phosphatase 45 38 - 126 U/L   Total Bilirubin 0.6 0.3 - 1.2 mg/dL   GFR calc non Af Amer >60 >60 mL/min   GFR calc Af Amer >60 >60 mL/min   Anion gap 14 5 - 15    Comment: Performed at [facility] [address], [place], [place] [address]  CBC     Status: Abnormal   Collection Time: 12/12/18 11:48 AM  Result Value Ref Range   WBC 10.1 4.0 - 10.5 K/uL   RBC 5.79 4.22 - 5.81 MIL/uL   Hemoglobin 14.9 13.0 - 17.0 g/dL   HCT 47.0 39.0 - 52.0 %   MCV 81.2 80.0 - 100.0 fL   MCH 25.7 (L) 26.0 - 34.0 pg   MCHC 31.7 30.0 - 36.0 g/dL   RDW 15.0 11.5 - 15.5 %   Platelets 260 150 - 400 K/uL   nRBC 0.0 0.0 - 0.2 %    Comment: Performed at [facility], [place] [address], [place], [place] [address]  CBG monitoring, ED     Status: Abnormal   Collection Time: 12/12/18  1:07 PM  Result Value Ref Range   Glucose-Capillary 106 (H) 70 - 99 mg/dL   Ct Head Wo Contrast  Result Date: 12/12/2018 CLINICAL DATA:  Stroke, follow-up. EXAM: CT HEAD WITHOUT CONTRAST TECHNIQUE: Contiguous axial images were obtained from the base of the skull through the vertex without intravenous contrast. COMPARISON:  Head CT 11/19/2015  FINDINGS: Brain: There is a 3.7 x 2.0 cm region of parenchymal hypodensity within the medial left temporal occipital lobes (series 3, image 14) consistent with a posterior cerebral artery territory acute/subacute infarction. There is no acute intracranial hemorrhage. No midline shift or extra-axial fluid collection. Mild ill-defined hypoattenuation of the cerebral white matter is nonspecific, but consistent with chronic small vessel ischemic disease. Incidentally noted cavum septum pellucidum. Mild generalized parenchymal atrophy. Vascular: No hyperdense vessel. Atherosclerotic calcification of the carotid artery siphons and vertebrobasilar system. Skull: No calvarial fracture. Sinuses/Orbits: Mild scattered paranasal sinus mucosal thickening. No significant mastoid effusion. These results were called by telephone at the time of interpretation on 12/12/2018 at 12:51 pm to Dr. Gareth Morgan , who verbally acknowledged these results. IMPRESSION: 3.7 x 2.0 cm hypodensity within the medial left temporal occipital lobe, within the left posterior cerebral artery vascular territory, consistent with acute/subacute infarct. No acute intracranial hemorrhage or midline shift. Mild generalized parenchymal atrophy and chronic small vessel ischemic disease. Electronically Signed   By: Kellie Simmering   On: 12/12/2018 12:58    Assessment: 68 y.o. male sudden onset of headache as well as some confusion and speech difficulties which appear to have improved secondary to subacute left posterior cerebral artery infarct likely of embolic etiology from atrial fibrillation on chronic anticoagulation.  Vascular risk factors of atrial fibrillation, hypertension, hyperlipidemia, sleep apnea, diabetes and aortic stenosis     Plan: Admit to medical team for further stroke evaluation and management.  Discussed with Dr.Erin Schlossman and Dr. Ellin Goodie 1. HgbA1c, fasting lipid panel 2. MRI, MRA  of the brain without contrast 3. PT  consult, OT consult, Speech consult 4. Echocardiogram 5. Carotid dopplers 6. Prophylactic therapy-continue Eliquis for now but will discuss with patient and family switching to alternative agent like Pradaxa given failure of Eliquis  7. Risk factor modification 8. Telemetry monitoring 9. Frequent neuro checks  I have personally obtained history,examined this patient, reviewed notes, independently viewed  imaging studies, participated in medical decision making and plan of care.ROS completed by me personally and pertinent positives fully documented  I have made any additions or clarifications directly to the above note.   Greater than 50% time during this 80-minute consultation visit was spent on counseling and coordination of care about his embolic stroke discussion about evaluation and treatment plan and answering questions.  Antony Contras, MD Medical Director [facility] Pager: [phone] 12/12/2018 1:43 PM  12/12/2018, 1:29 PM

## 2018-12-12 NOTE — Telephone Encounter (Signed)
Medicare and [employer], [place], patient sent to GI via email on 09/01. DW

## 2018-12-12 NOTE — ED Provider Notes (Signed)
[facility] EMERGENCY DEPARTMENT Provider Note   CSN: [id number] Arrival date & time: 12/12/18  1124     History   Chief Complaint Chief Complaint  Patient presents with   Headache   Altered Mental Status    HPI Vernon Ruiz is a 68 y.o. male.     HPI   Headache began around 7PM, maybe sooner Right sided headache, heaviness radiating around to the other side Came home from work around lunch time yesterday and family reports he was also not himself then, walked abnormally up the stairs, slumped on the table, was told walking and talking funny, couldn't remember things at work yesterday Was having difficulty remembering names of doctor Today had an appointment and was driving around not remembering how to get there  This AM and yesterday was having some memory problems, feels better now Mild aching in head  No fevers, cough, nausea, vomiting, abd pain, dyspnea, CP  Past Medical History:  Diagnosis Date   Aortic stenosis    s/p AVR using a 23mm Edwards Magna-Ease pericardial valve 10/04/16   Coronary artery disease    Diabetes mellitus without complication (HCC)    GERD (gastroesophageal reflux disease)    Heart murmur    Hypertension    Obstructive sleep apnea    positive from a home test, still needs to get another study done   Persistent atrial fibrillation    s/p clipping of LA appendage 10/04/16   Right carotid bruit 12/12/2018   Right foot drop 11/07/2018    Patient Active Problem List   Diagnosis Date Noted   Right carotid bruit 12/12/2018   CVA (cerebral vascular accident) (HCC) 12/12/2018   Right foot drop 11/07/2018   HTN (hypertension) 07/27/2017   S/P AVR 10/04/2016   OSA (obstructive sleep apnea) 01/20/2016   Severe aortic stenosis 01/29/2014   Paroxysmal atrial fibrillation (HCC) 01/29/2014   Abdominal pain 01/29/2014    Past Surgical History:  Procedure Laterality Date   AORTIC VALVE REPLACEMENT N/A  10/04/2016   Procedure: AORTIC VALVE REPLACEMENT (AVR);  Surgeon: [name], MD;  Location: [facility] OR;  Service: Open Heart Surgery;  Laterality: N/A;   APPENDECTOMY     CARDIAC CATHETERIZATION N/A 03/10/2016   Procedure: Right/Left Heart Cath and Coronary Angiography;  Surgeon: [name], MD;  Location: [facility] INVASIVE CV LAB;  Service: Cardiovascular;  Laterality: N/A;   CLIPPING OF ATRIAL APPENDAGE N/A 10/04/2016   Procedure: CLIPPING OF ATRIAL APPENDAGE;  Surgeon: [name], MD;  Location: [facility] OR;  Service: Open Heart Surgery;  Laterality: N/A;   COLONOSCOPY     EYE SURGERY Right    growth removed from eye   HERNIA REPAIR Bilateral    inguinal   TEE WITHOUT CARDIOVERSION N/A 10/04/2016   Procedure: TRANSESOPHAGEAL ECHOCARDIOGRAM (TEE);  Surgeon: [name], MD;  Location: [facility] OR;  Service: Open Heart Surgery;  Laterality: N/A;        Home Medications    Prior to Admission medications   Medication Sig Start Date End Date Taking? Authorizing Provider  acetaminophen (TYLENOL) 325 MG tablet Take 2 tablets (650 mg total) by mouth every 6 (six) hours as needed for mild pain. 10/08/16  Yes Doree Fudge M, PA-C  apixaban (ELIQUIS) 5 MG TABS tablet Take 5 mg by mouth 2 (two) times daily.   Yes [provider]  aspirin EC 81 MG tablet Take 1 tablet (81 mg total) by mouth daily. 10/09/16  Yes Ardelle Balls,  PA-C  Cyanocobalamin (VITAMIN B 12 PO) Take 100 mg by mouth every other day.    Yes [provider]  Dulaglutide (TRULICITY) 1.5 MG/0.5ML SOPN Inject 1.5 mg into the skin once a week. Sunday   Yes [provider]  empagliflozin (JARDIANCE) 25 MG TABS tablet Take 12.5 mg by mouth daily.   Yes [provider]  glimepiride (AMARYL) 4 MG tablet Take 4 mg by mouth 2 (two) times daily.  10/15/18  Yes [provider]  ibuprofen (ADVIL) 200 MG tablet Take 400 mg by mouth every 6 (six) hours as needed for headache.   Yes  [provider]  Insulin Glargine (TOUJEO SOLOSTAR [place]) Inject 40 Units into the skin every evening.    Yes [provider]  losartan (COZAAR) 100 MG tablet Take 1 tablet (100 mg total) by mouth daily. 07/11/18 07/06/19 Yes [name], MD  metFORMIN (GLUCOPHAGE) 1000 MG tablet Take 1,000 mg by mouth 2 (two) times daily.  06/14/16  Yes [provider]  metoprolol tartrate (LOPRESSOR) 50 MG tablet Take 50 mg by mouth 2 (two) times daily.   Yes [provider]  ALPRAZolam Prudy Feeler) 0.5 MG tablet Take 2 tablets approximately 45 minutes prior to the MRI study, take a third tablet if needed. Patient not taking: Reported on 12/12/2018 12/12/18   York Spaniel, MD  amLODipine (NORVASC) 5 MG tablet Take 1 tablet (5 mg total) by mouth daily. Patient not taking: Reported on 12/12/2018 12/11/18 12/06/19  [name], MD    Family History History reviewed. No pertinent family history.  Social History Social History   Tobacco Use   Smoking status: Former Smoker    Types: Cigars    Quit date: 09/16/2016    Years since quitting: 2.2   Smokeless tobacco: Never Used  Substance Use Topics   Alcohol use: Yes    Comment: very rare   Drug use: No     Allergies   Penicillins, Sulfa antibiotics, and Sulfamethoxazole   Review of Systems Review of Systems  Constitutional: Negative for fever.  HENT: Negative for sore throat.   Eyes: Negative for visual disturbance.  Respiratory: Negative for shortness of breath.   Cardiovascular: Negative for chest pain.  Gastrointestinal: Negative for abdominal pain, nausea and vomiting.  Genitourinary: Negative for difficulty urinating.  Musculoskeletal: Negative for back pain and neck stiffness.  Skin: Negative for rash.  Neurological: Positive for speech difficulty (per family) and headaches. Negative for dizziness, syncope, facial asymmetry, weakness and numbness.     Physical Exam Updated Vital Signs BP 125/70 (BP  Location: Left Arm)    Pulse (!) 108    Temp 98.3 F (36.8 C) (Oral)    Resp 13    Ht  (1.651 m)    Wt 84.4 kg    SpO2 96%    BMI 30.95 kg/m   Physical Exam Vitals signs and nursing note reviewed.  Constitutional:      General: He is not in acute distress.    Appearance: He is well-developed. He is not diaphoretic.  HENT:     Head: Normocephalic and atraumatic.  Eyes:     General: No visual field deficit.    Conjunctiva/sclera: Conjunctivae normal.  Neck:     Musculoskeletal: Normal range of motion.  Cardiovascular:     Rate and Rhythm: Normal rate and regular rhythm.     Heart sounds: No murmur.  Pulmonary:     Effort: Pulmonary effort is normal. No respiratory distress.  Abdominal:     Palpations: Abdomen is soft.  Skin:    General: Skin is warm and dry.  Neurological:     Mental Status: He is alert and oriented to person, place, and time.     GCS: GCS eye subscore is 4. GCS verbal subscore is 5. GCS motor subscore is 6.     Cranial Nerves: No cranial nerve deficit, dysarthria or facial asymmetry.     Sensory: Sensation is intact. No sensory deficit.     Motor: Weakness (RLE weakness both proximal and distal pt reports is chronic for 1-2 months) present. No pronator drift.     Coordination: Coordination is intact. Coordination normal. Finger-Nose-Finger Test normal.      ED Treatments / Results  Labs (all labs ordered are listed, but only abnormal results are displayed) Labs Reviewed  COMPREHENSIVE METABOLIC PANEL - Abnormal; Notable for the following components:      Result Value   Glucose, Bld 140 (*)    All other components within normal limits  CBC - Abnormal; Notable for the following components:   MCH 25.7 (*)    All other components within normal limits  GLUCOSE, CAPILLARY - Abnormal; Notable for the following components:   Glucose-Capillary 217 (*)    All other components within normal limits  CBG MONITORING, ED - Abnormal; Notable for the following  components:   Glucose-Capillary 106 (*)    All other components within normal limits  SARS CORONAVIRUS 2 (TAT 6-24 HRS)  GLUCOSE, CAPILLARY  HEMOGLOBIN A1C  LIPID PANEL  TSH    EKG EKG Interpretation  Date/Time:  Tuesday December 12 2018 11:57:49 EDT Ventricular Rate:  78 PR Interval:  134 QRS Duration: 94 QT Interval:  398 QTC Calculation: 453 R Axis:   82 Text Interpretation:  Sinus rhythm with marked sinus arrhythmia Nonspecific ST abnormality Abnormal ECG No significant change since last tracing Confirmed by Alvira MondaySchlossman, Lindzie Boxx ([id number]) on 12/12/2018 12:27:18 PM   Radiology Ct Head Wo Contrast  Result Date: 12/12/2018 CLINICAL DATA:  Stroke, follow-up. EXAM: CT HEAD WITHOUT CONTRAST TECHNIQUE: Contiguous axial images were obtained from the base of the skull through the vertex without intravenous contrast. COMPARISON:  Head CT 11/19/2015 FINDINGS: Brain: There is a 3.7 x 2.0 cm region of parenchymal hypodensity within the medial left temporal occipital lobes (series 3, image 14) consistent with a posterior cerebral artery territory acute/subacute infarction. There is no acute intracranial hemorrhage. No midline shift or extra-axial fluid collection. Mild ill-defined hypoattenuation of the cerebral white matter is nonspecific, but consistent with chronic small vessel ischemic disease. Incidentally noted cavum septum pellucidum. Mild generalized parenchymal atrophy. Vascular: No hyperdense vessel. Atherosclerotic calcification of the carotid artery siphons and vertebrobasilar system. Skull: No calvarial fracture. Sinuses/Orbits: Mild scattered paranasal sinus mucosal thickening. No significant mastoid effusion. These results were called by telephone at the time of interpretation on 12/12/2018 at 12:51 pm to Dr. Alvira MondayERIN Ashtyn Freilich , who verbally acknowledged these results. IMPRESSION: 3.7 x 2.0 cm hypodensity within the medial left temporal occipital lobe, within the left posterior cerebral artery  vascular territory, consistent with acute/subacute infarct. No acute intracranial hemorrhage or midline shift. Mild generalized parenchymal atrophy and chronic small vessel ischemic disease. Electronically Signed   By: Jackey LogeKyle  Golden   On: 12/12/2018 12:58   Mr Angio Head Wo Contrast  Result Date: 12/12/2018 CLINICAL DATA:  Initial evaluation for acute stroke. EXAM: MRI HEAD WITHOUT CONTRAST MRA HEAD WITHOUT CONTRAST MRA NECK WITHOUT AND WITH CONTRAST TECHNIQUE: Multiplanar, multiecho pulse  sequences of the brain and surrounding structures were obtained without intravenous contrast. Angiographic images of the Circle of Willis were obtained using MRA technique without intravenous contrast. Angiographic images of the neck were obtained using MRA technique without and with intravenous contrast. Carotid stenosis measurements (when applicable) are obtained utilizing NASCET criteria, using the distal internal carotid diameter as the denominator. CONTRAST:  8 cc of Gadavist COMPARISON:  Prior CT from earlier the same day. FINDINGS: MRI HEAD FINDINGS Brain: Diffuse prominence of the CSF containing spaces compatible with generalized age-related cerebral atrophy. Mild scattered T2/FLAIR hyperintensity within the periventricular and deep white matter both cerebral hemispheres most consistent with chronic small vessel ischemic disease, minimal for age. 4.6 cm area of confluent restricted diffusion involving the mesial left temporal occipital region consistent with acute left PCA territory infarct (series 2, image 28). Mild extension into the posterior left hippocampal formation. Minimal patchy involvement of the lateral left thalamus. No associated hemorrhage or mass effect. No other evidence for acute or subacute ischemia. Gray-white matter differentiation otherwise maintained. No other areas of remote cortical infarction. No foci of susceptibility artifact to suggest acute or chronic intracranial hemorrhage. No mass lesion,  midline shift or mass effect. Ventricles normal in size without evidence for hydrocephalus. Cavum et septum pellucidum noted. Pituitary gland suprasellar region within normal limits. Midline structures intact. Vascular: Major intracranial vascular flow voids well maintained and normal in appearance. Skull and upper cervical spine: Craniocervical junction within normal limits. Degenerative disc bulging noted at C3-4 with resultant mild-to-moderate spinal stenosis (series 3, image 19). Bone marrow signal intensity within normal limits. No scalp soft tissue abnormality. Sinuses/Orbits: Globes and orbital soft tissues within normal limits. Paranasal sinuses are clear. Trace bilateral mastoid effusions, of doubtful significance. Other: None. MRA HEAD FINDINGS ANTERIOR CIRCULATION: Distal cervical segments of the internal carotid arteries are patent with symmetric antegrade flow. Distal cervical left ICA tortuous. Scattered atheromatous irregularity within the petrous, cavernous, and supraclinoid segments bilaterally. There is a short-segment moderate to severe stenosis involving the vertical petrous right ICA of up to 70% by NASCET criteria (series 5, image 113). No other hemodynamically significant stenosis. ICA termini well perfused. Atheromatous irregularity throughout the A1 segments without high-grade stenosis. Left A1 dominant. Normal anterior communicating artery complex. Anterior cerebral arteries widely patent to their distal aspects without stenosis. Multifocal atheromatous irregularity throughout the M1 segments bilaterally without high-grade stenosis. Normal MCA bifurcations. On the left, multifocal severe proximal M2 stenoses involving both superior and inferior divisions noted (series 552, image 10) no significant stenosis seen about the proximal right M2 branches. Distal MCA branches well perfused and symmetric bilaterally. POSTERIOR CIRCULATION: Right vertebral artery slightly dominant and is patent to  the vertebrobasilar junction without stenosis. Short-segment moderate stenosis involving the distal left V4 segment just prior to the vertebrobasilar junction (series 5, image 128). Left PICA patent proximally. Right PICA not seen. Short-segment moderate stenosis noted at the proximal basilar artery, just prior to the takeoff of the anterior inferior cerebral arteries (series 5, image 123). Basilar otherwise widely patent to its distal aspect. Superior cerebral arteries patent bilaterally. Both of the posterior cerebral arteries primarily supplied via the basilar. Scattered atheromatous irregularity within the right PCA with smooth moderate narrowing of the mid right P2 segment (series 555, image 12). Right PCA is perfused to its distal aspect. Short-segment moderate proximal left P2 stenosis (series 555, image 9). Distally, there is abrupt occlusion of a proximal left P3 segment (series 555, image 8), acute in nature given the  acute left PCA territory infarct. No intracranial aneurysm. MRA NECK FINDINGS Source images reviewed. Visualized aortic arch of normal caliber with normal 3 vessel morphology. No hemodynamically significant stenosis seen about the origin of the great vessels. Visualized left subclavian artery widely patent. Short-segment stenosis of up to approximately 40% seen at the origin of the right subclavian artery (series 1201, image 45). Right subclavian otherwise widely patent. Right common carotid artery widely patent from its origin to the bifurcation without stenosis. Atheromatous irregularity about the proximal right ICA with associated mild stenosis of up to 40% by NASCET criteria. Right ICA mildly tortuous but otherwise patent to the skull base without stenosis or occlusion. Left common carotid artery patent from its origin to the bifurcation without stenosis. Atheromatous irregularity about the origin of the left ICA with associated mild stenosis of up to approximately 40% by NASCET criteria.  Left ICA tortuous but otherwise widely patent to the skull base without stenosis or occlusion. Both vertebral arteries arise from the subclavian arteries. Right vertebral artery slightly dominant. Vertebral arteries widely patent within the neck without stenosis or occlusion. IMPRESSION: MRI HEAD IMPRESSION: 1. Moderate sized acute ischemic left PCA territory infarct involving the mesial left temporal occipital region with patchy involvement of the left thalamus as above. No associated hemorrhage or mass effect. 2. Underlying age-related cerebral atrophy with mild chronic microvascular ischemic disease. 3. Broad posterior disc protrusion at C3-4 with resultant mild-to-moderate spinal stenosis. Finding could be further assessed with dedicated MRI of the cervical spine as clinically desired. MRA HEAD IMPRESSION: 1. Acute left P3 occlusion, corresponding with the acute left PCA territory infarct. 2. Moderate multifocal stenoses involving the distal left V4 segment and proximal basilar artery as above. 3. Short-segment 70% stenosis involving the vertical petrous right ICA. 4. Multifocal severe proximal left M2 stenoses as above. 5. Moderate atherosclerotic irregularity elsewhere throughout the intracranial circulation. No other hemodynamically significant or correctable stenosis identified. MRA NECK IMPRESSION: 1. Atheromatous irregularity with up to approximately 40% stenosis about the origins of the internal carotid arteries bilaterally. Otherwise wide patency of both carotid artery systems within the neck. 2. Wide patency of both vertebral arteries within the neck. Right vertebral artery slightly dominant. 3. Short-segment 40% stenosis at the origin of the right subclavian artery. Electronically Signed   By: Jeannine Boga M.D.   On: 12/12/2018 20:46   Mr Angio Neck W Wo Contrast  Result Date: 12/12/2018 CLINICAL DATA:  Initial evaluation for acute stroke. EXAM: MRI HEAD WITHOUT CONTRAST MRA HEAD WITHOUT  CONTRAST MRA NECK WITHOUT AND WITH CONTRAST TECHNIQUE: Multiplanar, multiecho pulse sequences of the brain and surrounding structures were obtained without intravenous contrast. Angiographic images of the Circle of Willis were obtained using MRA technique without intravenous contrast. Angiographic images of the neck were obtained using MRA technique without and with intravenous contrast. Carotid stenosis measurements (when applicable) are obtained utilizing NASCET criteria, using the distal internal carotid diameter as the denominator. CONTRAST:  8 cc of Gadavist COMPARISON:  Prior CT from earlier the same day. FINDINGS: MRI HEAD FINDINGS Brain: Diffuse prominence of the CSF containing spaces compatible with generalized age-related cerebral atrophy. Mild scattered T2/FLAIR hyperintensity within the periventricular and deep white matter both cerebral hemispheres most consistent with chronic small vessel ischemic disease, minimal for age. 4.6 cm area of confluent restricted diffusion involving the mesial left temporal occipital region consistent with acute left PCA territory infarct (series 2, image 28). Mild extension into the posterior left hippocampal formation. Minimal patchy involvement of the  lateral left thalamus. No associated hemorrhage or mass effect. No other evidence for acute or subacute ischemia. Gray-white matter differentiation otherwise maintained. No other areas of remote cortical infarction. No foci of susceptibility artifact to suggest acute or chronic intracranial hemorrhage. No mass lesion, midline shift or mass effect. Ventricles normal in size without evidence for hydrocephalus. Cavum et septum pellucidum noted. Pituitary gland suprasellar region within normal limits. Midline structures intact. Vascular: Major intracranial vascular flow voids well maintained and normal in appearance. Skull and upper cervical spine: Craniocervical junction within normal limits. Degenerative disc bulging noted at  C3-4 with resultant mild-to-moderate spinal stenosis (series 3, image 19). Bone marrow signal intensity within normal limits. No scalp soft tissue abnormality. Sinuses/Orbits: Globes and orbital soft tissues within normal limits. Paranasal sinuses are clear. Trace bilateral mastoid effusions, of doubtful significance. Other: None. MRA HEAD FINDINGS ANTERIOR CIRCULATION: Distal cervical segments of the internal carotid arteries are patent with symmetric antegrade flow. Distal cervical left ICA tortuous. Scattered atheromatous irregularity within the petrous, cavernous, and supraclinoid segments bilaterally. There is a short-segment moderate to severe stenosis involving the vertical petrous right ICA of up to 70% by NASCET criteria (series 5, image 113). No other hemodynamically significant stenosis. ICA termini well perfused. Atheromatous irregularity throughout the A1 segments without high-grade stenosis. Left A1 dominant. Normal anterior communicating artery complex. Anterior cerebral arteries widely patent to their distal aspects without stenosis. Multifocal atheromatous irregularity throughout the M1 segments bilaterally without high-grade stenosis. Normal MCA bifurcations. On the left, multifocal severe proximal M2 stenoses involving both superior and inferior divisions noted (series 552, image 10) no significant stenosis seen about the proximal right M2 branches. Distal MCA branches well perfused and symmetric bilaterally. POSTERIOR CIRCULATION: Right vertebral artery slightly dominant and is patent to the vertebrobasilar junction without stenosis. Short-segment moderate stenosis involving the distal left V4 segment just prior to the vertebrobasilar junction (series 5, image 128). Left PICA patent proximally. Right PICA not seen. Short-segment moderate stenosis noted at the proximal basilar artery, just prior to the takeoff of the anterior inferior cerebral arteries (series 5, image 123). Basilar otherwise  widely patent to its distal aspect. Superior cerebral arteries patent bilaterally. Both of the posterior cerebral arteries primarily supplied via the basilar. Scattered atheromatous irregularity within the right PCA with smooth moderate narrowing of the mid right P2 segment (series 555, image 12). Right PCA is perfused to its distal aspect. Short-segment moderate proximal left P2 stenosis (series 555, image 9). Distally, there is abrupt occlusion of a proximal left P3 segment (series 555, image 8), acute in nature given the acute left PCA territory infarct. No intracranial aneurysm. MRA NECK FINDINGS Source images reviewed. Visualized aortic arch of normal caliber with normal 3 vessel morphology. No hemodynamically significant stenosis seen about the origin of the great vessels. Visualized left subclavian artery widely patent. Short-segment stenosis of up to approximately 40% seen at the origin of the right subclavian artery (series 1201, image 45). Right subclavian otherwise widely patent. Right common carotid artery widely patent from its origin to the bifurcation without stenosis. Atheromatous irregularity about the proximal right ICA with associated mild stenosis of up to 40% by NASCET criteria. Right ICA mildly tortuous but otherwise patent to the skull base without stenosis or occlusion. Left common carotid artery patent from its origin to the bifurcation without stenosis. Atheromatous irregularity about the origin of the left ICA with associated mild stenosis of up to approximately 40% by NASCET criteria. Left ICA tortuous but otherwise widely patent to  the skull base without stenosis or occlusion. Both vertebral arteries arise from the subclavian arteries. Right vertebral artery slightly dominant. Vertebral arteries widely patent within the neck without stenosis or occlusion. IMPRESSION: MRI HEAD IMPRESSION: 1. Moderate sized acute ischemic left PCA territory infarct involving the mesial left temporal  occipital region with patchy involvement of the left thalamus as above. No associated hemorrhage or mass effect. 2. Underlying age-related cerebral atrophy with mild chronic microvascular ischemic disease. 3. Broad posterior disc protrusion at C3-4 with resultant mild-to-moderate spinal stenosis. Finding could be further assessed with dedicated MRI of the cervical spine as clinically desired. MRA HEAD IMPRESSION: 1. Acute left P3 occlusion, corresponding with the acute left PCA territory infarct. 2. Moderate multifocal stenoses involving the distal left V4 segment and proximal basilar artery as above. 3. Short-segment 70% stenosis involving the vertical petrous right ICA. 4. Multifocal severe proximal left M2 stenoses as above. 5. Moderate atherosclerotic irregularity elsewhere throughout the intracranial circulation. No other hemodynamically significant or correctable stenosis identified. MRA NECK IMPRESSION: 1. Atheromatous irregularity with up to approximately 40% stenosis about the origins of the internal carotid arteries bilaterally. Otherwise wide patency of both carotid artery systems within the neck. 2. Wide patency of both vertebral arteries within the neck. Right vertebral artery slightly dominant. 3. Short-segment 40% stenosis at the origin of the right subclavian artery. Electronically Signed   By: Rise Mu M.D.   On: 12/12/2018 20:46   Mr Brain Wo Contrast  Result Date: 12/12/2018 CLINICAL DATA:  Initial evaluation for acute stroke. EXAM: MRI HEAD WITHOUT CONTRAST MRA HEAD WITHOUT CONTRAST MRA NECK WITHOUT AND WITH CONTRAST TECHNIQUE: Multiplanar, multiecho pulse sequences of the brain and surrounding structures were obtained without intravenous contrast. Angiographic images of the Circle of Willis were obtained using MRA technique without intravenous contrast. Angiographic images of the neck were obtained using MRA technique without and with intravenous contrast. Carotid stenosis  measurements (when applicable) are obtained utilizing NASCET criteria, using the distal internal carotid diameter as the denominator. CONTRAST:  8 cc of Gadavist COMPARISON:  Prior CT from earlier the same day. FINDINGS: MRI HEAD FINDINGS Brain: Diffuse prominence of the CSF containing spaces compatible with generalized age-related cerebral atrophy. Mild scattered T2/FLAIR hyperintensity within the periventricular and deep white matter both cerebral hemispheres most consistent with chronic small vessel ischemic disease, minimal for age. 4.6 cm area of confluent restricted diffusion involving the mesial left temporal occipital region consistent with acute left PCA territory infarct (series 2, image 28). Mild extension into the posterior left hippocampal formation. Minimal patchy involvement of the lateral left thalamus. No associated hemorrhage or mass effect. No other evidence for acute or subacute ischemia. Gray-white matter differentiation otherwise maintained. No other areas of remote cortical infarction. No foci of susceptibility artifact to suggest acute or chronic intracranial hemorrhage. No mass lesion, midline shift or mass effect. Ventricles normal in size without evidence for hydrocephalus. Cavum et septum pellucidum noted. Pituitary gland suprasellar region within normal limits. Midline structures intact. Vascular: Major intracranial vascular flow voids well maintained and normal in appearance. Skull and upper cervical spine: Craniocervical junction within normal limits. Degenerative disc bulging noted at C3-4 with resultant mild-to-moderate spinal stenosis (series 3, image 19). Bone marrow signal intensity within normal limits. No scalp soft tissue abnormality. Sinuses/Orbits: Globes and orbital soft tissues within normal limits. Paranasal sinuses are clear. Trace bilateral mastoid effusions, of doubtful significance. Other: None. MRA HEAD FINDINGS ANTERIOR CIRCULATION: Distal cervical segments of the  internal carotid arteries are patent  with symmetric antegrade flow. Distal cervical left ICA tortuous. Scattered atheromatous irregularity within the petrous, cavernous, and supraclinoid segments bilaterally. There is a short-segment moderate to severe stenosis involving the vertical petrous right ICA of up to 70% by NASCET criteria (series 5, image 113). No other hemodynamically significant stenosis. ICA termini well perfused. Atheromatous irregularity throughout the A1 segments without high-grade stenosis. Left A1 dominant. Normal anterior communicating artery complex. Anterior cerebral arteries widely patent to their distal aspects without stenosis. Multifocal atheromatous irregularity throughout the M1 segments bilaterally without high-grade stenosis. Normal MCA bifurcations. On the left, multifocal severe proximal M2 stenoses involving both superior and inferior divisions noted (series 552, image 10) no significant stenosis seen about the proximal right M2 branches. Distal MCA branches well perfused and symmetric bilaterally. POSTERIOR CIRCULATION: Right vertebral artery slightly dominant and is patent to the vertebrobasilar junction without stenosis. Short-segment moderate stenosis involving the distal left V4 segment just prior to the vertebrobasilar junction (series 5, image 128). Left PICA patent proximally. Right PICA not seen. Short-segment moderate stenosis noted at the proximal basilar artery, just prior to the takeoff of the anterior inferior cerebral arteries (series 5, image 123). Basilar otherwise widely patent to its distal aspect. Superior cerebral arteries patent bilaterally. Both of the posterior cerebral arteries primarily supplied via the basilar. Scattered atheromatous irregularity within the right PCA with smooth moderate narrowing of the mid right P2 segment (series 555, image 12). Right PCA is perfused to its distal aspect. Short-segment moderate proximal left P2 stenosis (series 555,  image 9). Distally, there is abrupt occlusion of a proximal left P3 segment (series 555, image 8), acute in nature given the acute left PCA territory infarct. No intracranial aneurysm. MRA NECK FINDINGS Source images reviewed. Visualized aortic arch of normal caliber with normal 3 vessel morphology. No hemodynamically significant stenosis seen about the origin of the great vessels. Visualized left subclavian artery widely patent. Short-segment stenosis of up to approximately 40% seen at the origin of the right subclavian artery (series 1201, image 45). Right subclavian otherwise widely patent. Right common carotid artery widely patent from its origin to the bifurcation without stenosis. Atheromatous irregularity about the proximal right ICA with associated mild stenosis of up to 40% by NASCET criteria. Right ICA mildly tortuous but otherwise patent to the skull base without stenosis or occlusion. Left common carotid artery patent from its origin to the bifurcation without stenosis. Atheromatous irregularity about the origin of the left ICA with associated mild stenosis of up to approximately 40% by NASCET criteria. Left ICA tortuous but otherwise widely patent to the skull base without stenosis or occlusion. Both vertebral arteries arise from the subclavian arteries. Right vertebral artery slightly dominant. Vertebral arteries widely patent within the neck without stenosis or occlusion. IMPRESSION: MRI HEAD IMPRESSION: 1. Moderate sized acute ischemic left PCA territory infarct involving the mesial left temporal occipital region with patchy involvement of the left thalamus as above. No associated hemorrhage or mass effect. 2. Underlying age-related cerebral atrophy with mild chronic microvascular ischemic disease. 3. Broad posterior disc protrusion at C3-4 with resultant mild-to-moderate spinal stenosis. Finding could be further assessed with dedicated MRI of the cervical spine as clinically desired. MRA HEAD  IMPRESSION: 1. Acute left P3 occlusion, corresponding with the acute left PCA territory infarct. 2. Moderate multifocal stenoses involving the distal left V4 segment and proximal basilar artery as above. 3. Short-segment 70% stenosis involving the vertical petrous right ICA. 4. Multifocal severe proximal left M2 stenoses as above. 5. Moderate atherosclerotic  irregularity elsewhere throughout the intracranial circulation. No other hemodynamically significant or correctable stenosis identified. MRA NECK IMPRESSION: 1. Atheromatous irregularity with up to approximately 40% stenosis about the origins of the internal carotid arteries bilaterally. Otherwise wide patency of both carotid artery systems within the neck. 2. Wide patency of both vertebral arteries within the neck. Right vertebral artery slightly dominant. 3. Short-segment 40% stenosis at the origin of the right subclavian artery. Electronically Signed   By: Rise MuBenjamin  McClintock M.D.   On: 12/12/2018 20:46    Procedures Procedures (including critical care time)  Medications Ordered in ED Medications  sodium chloride flush (NS) 0.9 % injection 3 mL (3 mLs Intravenous Not Given 12/12/18 1301)  0.9 %  sodium chloride infusion ( Intravenous New Bag/Given 12/12/18 1612)  acetaminophen (TYLENOL) tablet 650 mg (650 mg Oral Given 12/12/18 1607)    Or  acetaminophen (TYLENOL) solution 650 mg ( Per Tube See Alternative 12/12/18 1607)    Or  acetaminophen (TYLENOL) suppository 650 mg ( Rectal See Alternative 12/12/18 1607)  senna-docusate (Senokot-S) tablet 1 tablet (has no administration in time range)  apixaban (ELIQUIS) tablet 5 mg (5 mg Oral Given 12/12/18 1608)  losartan (COZAAR) tablet 100 mg (100 mg Oral Given 12/12/18 1607)  metoprolol tartrate (LOPRESSOR) tablet 50 mg (50 mg Oral Given 12/12/18 2126)  insulin glargine (LANTUS) injection 20 Units (20 Units Subcutaneous Given 12/12/18 1757)  insulin aspart (novoLOG) injection 0-9 Units (0 Units Subcutaneous Not  Given 12/12/18 1635)   stroke: mapping our early stages of recovery book ( Does not apply Given 12/12/18 1559)  LORazepam (ATIVAN) injection 0.5 mg (0.5 mg Intravenous Given 12/12/18 1753)  gadobutrol (GADAVIST) 1 MMOL/ML injection 8 mL (8 mLs Intravenous Contrast Given 12/12/18 1842)     Initial Impression / Assessment and Plan / ED Course  I have reviewed the triage vital signs and the nursing notes.  Pertinent labs & imaging results that were available during my care of the patient were reviewed by me and considered in my medical decision making (see chart for details).        68yo male with history of aortic stenosis, CAD, DM, (hypertension per chart, pt denies history of), paroxysmal atrial fibrillation on eliquis, presents with concern for headache, memory problems.  Dr. Pearlean BrownieSethi of Neurology evaluated patient on arrival.  CT head done showing evidence of subacute left posterior cerebral artery infarct.  Will admit for further evaluation including MR/MRA.   Admitted to Dr. Katrinka BlazingSmith with Neurology following.  Final Clinical Impressions(s) / ED Diagnoses   Final diagnoses:  Cerebrovascular accident (CVA), unspecified mechanism [facility](HCC)    ED Discharge Orders    None       Alvira MondaySchlossman, Rily Nickey, MD 12/12/18 2157

## 2018-12-12 NOTE — Progress Notes (Signed)
Patient refuses CPAP therapy at this time.  States that he does not wear CPAP at home and will not wear while in the hospital.  Patient advised that if he decides to wear he can notify RN or RT.

## 2018-12-12 NOTE — H&P (Signed)
History and Physical    Vernon Ruiz:[id number] DOB: 1950/07/21 DOA: 12/12/2018  Referring MD/NP/PA: Gareth Morgan, MD PCP: Hulan Fess, MD  Patient coming from: Home  Chief Complaint: Headache and memory issues  I have personally briefly reviewed patient's old medical records in [place]   HPI: Vernon Ruiz is a 68 y.o. male with medical history significant of HTN, CAD, A. fib on Eliquis, aortic stenosis s/p valve replacement, DM type II, right foot drop, and OSA; who presents with complaints of headache and memory issues.  History is obtained from the patient and his daughter.  Patient's last known normal is not totally clear.  His daughter states that at work yesterday morning he was reported to be walking and talking abnormally.  During lunch he reportedly slumped over and was disoriented.  He states that his headache was right sided and started sometime yesterday evening.  He has been to see Dr. Burt Knack yesterday, but could not remember doing so.  This morning he had an appointment with Dr. Jannifer Franklin his neurologist regarding right foot drop, but had trouble finding his way to the clinic which was unusual for him.  Family had reported that the patient had some slurred speech last night and this morning.  Patient denies any focal weakness, change in vision, fever, chest pain, nausea, vomiting, diarrhea, dysuria, falls, or loss of consciousness.  Associated symptoms included reports of palpitation a few days ago.  He reports that he is compliant with his Eliquis.  ED Course: Upon admission patient was noted to be afebrile, blood pressure 154/82-186/77, and all other vital signs maintained. Labs were noted to be relatively within normal limits.  Neurology was consulted, but patient without the window for TPA.  CT scan of the brain showed density in the posterior medial left temporal lobe compatible with acute left PCA stroke.  TRH called to admit.  Review of Systems  Constitutional:  Negative for fever and weight loss.  HENT: Negative for congestion and nosebleeds.   Eyes: Negative for double vision and photophobia.  Respiratory: Negative for cough and shortness of breath.   Cardiovascular: Positive for palpitations. Negative for leg swelling.  Gastrointestinal: Negative for abdominal pain, nausea and vomiting.  Genitourinary: Negative for dysuria and frequency.  Musculoskeletal: Negative for falls and myalgias.  Skin: Negative for rash.  Neurological: Positive for speech change and headaches. Negative for sensory change.  Psychiatric/Behavioral: Positive for memory loss. Negative for substance abuse.    Past Medical History:  Diagnosis Date   Aortic stenosis    s/p AVR using a 64mm Edwards Magna-Ease pericardial valve 10/04/16   Coronary artery disease    Diabetes mellitus without complication (HCC)    GERD (gastroesophageal reflux disease)    Heart murmur    Hypertension    Obstructive sleep apnea    positive from a home test, still needs to get another study done   Persistent atrial fibrillation    s/p clipping of LA appendage 10/04/16   Right carotid bruit 12/12/2018   Right foot drop 11/07/2018    Past Surgical History:  Procedure Laterality Date   AORTIC VALVE REPLACEMENT N/A 10/04/2016   Procedure: AORTIC VALVE REPLACEMENT (AVR);  Surgeon: Gaye Pollack, MD;  Location: [facility];  Service: Open Heart Surgery;  Laterality: N/A;   APPENDECTOMY     CARDIAC CATHETERIZATION N/A 03/10/2016   Procedure: Right/Left Heart Cath and Coronary Angiography;  Surgeon: Sherren Mocha, MD;  Location: [facility];  Service: Cardiovascular;  Laterality: N/A;   CLIPPING OF ATRIAL APPENDAGE N/A 10/04/2016   Procedure: CLIPPING OF ATRIAL APPENDAGE;  Surgeon: [name], MD;  Location: [facility];  Service: Open Heart Surgery;  Laterality: N/A;   COLONOSCOPY     EYE SURGERY Right    growth removed from eye   HERNIA REPAIR Bilateral    inguinal   TEE  WITHOUT CARDIOVERSION N/A 10/04/2016   Procedure: TRANSESOPHAGEAL ECHOCARDIOGRAM (TEE);  Surgeon: [name], MD;  Location: [facility];  Service: Open Heart Surgery;  Laterality: N/A;     reports that he quit smoking about 2 years ago. His smoking use included cigars. He has never used smokeless tobacco. He reports current alcohol use. He reports that he does not use drugs.  Allergies  Allergen Reactions   Penicillins Itching and Rash    All over body  Has patient had a PCN reaction causing immediate rash, facial/tongue/throat swelling, SOB or lightheadedness with hypotension: yes Has patient had a PCN reaction causing severe rash involving mucus membranes or skin necrosis: no Has patient had a PCN reaction that required hospitalization no Has patient had a PCN reaction occurring within the last 10 years: no If all of the above answers are "NO", then may proceed with Cephalosporin use.   Sulfa Antibiotics Rash    Rash   Sulfamethoxazole Rash    History reviewed. No pertinent family history.  Prior to Admission medications   Medication Sig Start Date End Date Taking? Authorizing Provider  acetaminophen (TYLENOL) 325 MG tablet Take 2 tablets (650 mg total) by mouth every 6 (six) hours as needed for mild pain. 10/08/16   Ardelle Balls, PA-C  ALPRAZolam Prudy Feeler) 0.5 MG tablet Take 2 tablets approximately 45 minutes prior to the MRI study, take a third tablet if needed. 12/12/18   York Spaniel, MD  amLODipine (NORVASC) 5 MG tablet Take 1 tablet (5 mg total) by mouth daily. Patient not taking: Reported on 12/12/2018 12/11/18 12/06/19  [name], MD  apixaban (ELIQUIS) 5 MG TABS tablet Take 5 mg by mouth 2 (two) times daily.    [provider]  aspirin EC 81 MG tablet Take 1 tablet (81 mg total) by mouth daily. 10/09/16   Ardelle Balls, PA-C  Cyanocobalamin (VITAMIN B 12 PO) Take 1 tablet by mouth every other day.     [provider]  Dulaglutide  (TRULICITY) 1.5 MG/0.5ML SOPN Trulicity 1.5 mg/0.5 mL subcutaneous pen injector  INJECT THE CONTENTS OF 1 PEN INTO THE SKIN ONCE WEEKLY    [provider]  glimepiride (AMARYL) 4 MG tablet Take 2 tablets by mouth daily. 10/15/18   [provider]  Insulin Glargine (TOUJEO SOLOSTAR [place]) Inject 30 Units into the skin every evening.     [provider]  losartan (COZAAR) 100 MG tablet Take 1 tablet (100 mg total) by mouth daily. 07/11/18 07/06/19  [name], MD  metFORMIN (GLUCOPHAGE) 1000 MG tablet Take 1,000 mg by mouth 2 (two) times daily.  06/14/16   [provider]  metoprolol tartrate (LOPRESSOR) 50 MG tablet Take 50 mg by mouth 2 (two) times daily.    [provider]    Physical Exam:  Constitutional: Elderly male who appears to be in no acute distress. Vitals:   12/12/18 1140 12/12/18 1141 12/12/18 1307  BP: (!) 154/82    Pulse: 64    Resp: 18    Temp: 99.5 F (37.5 C)    TempSrc: Oral  SpO2: 97%  97%  Weight:  84.4 kg   Height:  5\' 5"  (1.651 m)    Eyes: PERRL, lids and conjunctivae normal ENMT: Mucous membranes are moist. Posterior pharynx clear of any exudate or lesions.  Neck: normal, supple, no masses, no thyromegaly Respiratory: clear to auscultation bilaterally, no wheezing, no crackles. Normal respiratory effort. No accessory muscle use.  Cardiovascular: Regular rate and rhythm, no murmurs / rubs / gallops. No extremity edema. 2+ pedal pulses. No carotid bruits.  Abdomen: no tenderness, no masses palpated. No hepatosplenomegaly. Bowel sounds positive.  Musculoskeletal: no clubbing / cyanosis. No joint deformity upper and lower extremities. Good ROM, no contractures. Normal muscle tone.  Skin: no rashes, lesions, ulcers. No induration Neurologic: CN 2-12 grossly intact.  Right foot drop.  Psychiatric: Decreased recent memory. Alert and oriented x 3. Normal mood.     Labs on Admission: I have personally reviewed following  labs and imaging studies  CBC: Recent Labs  Lab 12/12/18 1148  WBC 10.1  HGB 14.9  HCT 47.0  MCV 81.2  PLT 260   Basic Metabolic Panel: Recent Labs  Lab 12/12/18 1148  NA 137  K 4.3  CL 101  CO2 22  GLUCOSE 140*  BUN 20  CREATININE 0.97  CALCIUM 9.6   GFR: Estimated Creatinine Clearance: 73.9 mL/min (by C-G formula based on SCr of 0.97 mg/dL). Liver Function Tests: Recent Labs  Lab 12/12/18 1148  AST 28  ALT 26  ALKPHOS 45  BILITOT 0.6  PROT 7.2  ALBUMIN 4.1   No results for input(s): LIPASE, AMYLASE in the last 168 hours. No results for input(s): AMMONIA in the last 168 hours. Coagulation Profile: No results for input(s): INR, PROTIME in the last 168 hours. Cardiac Enzymes: No results for input(s): CKTOTAL, CKMB, CKMBINDEX, TROPONINI in the last 168 hours. BNP (last 3 results) No results for input(s): PROBNP in the last 8760 hours. HbA1C: No results for input(s): HGBA1C in the last 72 hours. CBG: Recent Labs  Lab 12/12/18 1307  GLUCAP 106*   Lipid Profile: No results for input(s): CHOL, HDL, LDLCALC, TRIG, CHOLHDL, LDLDIRECT in the last 72 hours. Thyroid Function Tests: No results for input(s): TSH, T4TOTAL, FREET4, T3FREE, THYROIDAB in the last 72 hours. Anemia Panel: No results for input(s): VITAMINB12, FOLATE, FERRITIN, TIBC, IRON, RETICCTPCT in the last 72 hours. Urine analysis:    Component Value Date/Time   COLORURINE YELLOW 09/30/2016 1100   APPEARANCEUR CLEAR 09/30/2016 1100   LABSPEC 1.024 09/30/2016 1100   PHURINE 5.0 09/30/2016 1100   GLUCOSEU >=500 (A) 09/30/2016 1100   HGBUR NEGATIVE 09/30/2016 1100   BILIRUBINUR NEGATIVE 09/30/2016 1100   KETONESUR NEGATIVE 09/30/2016 1100   PROTEINUR NEGATIVE 09/30/2016 1100   NITRITE NEGATIVE 09/30/2016 1100   LEUKOCYTESUR NEGATIVE 09/30/2016 1100   Sepsis Labs: No results found for this or any previous visit (from the past 240 hour(s)).   Radiological Exams on Admission: Ct Head Wo  Contrast  Result Date: 12/12/2018 CLINICAL DATA:  Stroke, follow-up. EXAM: CT HEAD WITHOUT CONTRAST TECHNIQUE: Contiguous axial images were obtained from the base of the skull through the vertex without intravenous contrast. COMPARISON:  Head CT 11/19/2015 FINDINGS: Brain: There is a 3.7 x 2.0 cm region of parenchymal hypodensity within the medial left temporal occipital lobes (series 3, image 14) consistent with a posterior cerebral artery territory acute/subacute infarction. There is no acute intracranial hemorrhage. No midline shift or extra-axial fluid collection. Mild ill-defined hypoattenuation of the cerebral white matter is nonspecific,  but consistent with chronic small vessel ischemic disease. Incidentally noted cavum septum pellucidum. Mild generalized parenchymal atrophy. Vascular: No hyperdense vessel. Atherosclerotic calcification of the carotid artery siphons and vertebrobasilar system. Skull: No calvarial fracture. Sinuses/Orbits: Mild scattered paranasal sinus mucosal thickening. No significant mastoid effusion. These results were called by telephone at the time of interpretation on 12/12/2018 at 12:51 pm to Dr. Alvira MondayERIN SCHLOSSMAN , who verbally acknowledged these results. IMPRESSION: 3.7 x 2.0 cm hypodensity within the medial left temporal occipital lobe, within the left posterior cerebral artery vascular territory, consistent with acute/subacute infarct. No acute intracranial hemorrhage or midline shift. Mild generalized parenchymal atrophy and chronic small vessel ischemic disease. Electronically Signed   By: Jackey LogeKyle  Golden   On: 12/12/2018 12:58    EKG: Independently reviewed.  Sinus rhythm at 78 bpm with sinus arrhythmia.  Assessment/Plan CVA: Acute.  Patient presents of reports of slurred speech and confusion.  CT scan shows signs of acute/subacute stroke at the medial left temporal occipital lobe.  Patient was out of the window for TPA.  Neurology was consulted and recommended ablation and  stroke work-up. - Admit to telemetry bed - Stroke order set initiated - Neuro checks - Check MRI/MRA head w/o contrast  - Check carotid Dopplers - PT/OT/Speech to eval and treat - Check echocardiogram - Check Hemoglobin A1c and lipid panel in a.m. - Appreciate neurology consultative services, will follow-up   Paroxysmal atrial fibrillation on chronic anticoagulation: Patient appears to be in sinus rhythm at this time.  He is on anticoagulation of Eliquis, but appears to have had a stroke despite use. -Check TSH -Continue Eliquis, may warrant switching to alternative anticoagulant such as Pradaxa per neurology  Diabetes mellitus type 2 -Hypoglycemic protocol -Hold Amaryl, Jardiance, Trulicity, and metformin  -Decreased home glargine from 40 units down to 20 units while hospitalized -CBGs q. before meals with sensitive SSI  Essential hypertension -Continue metoprolol and losartan  Right foot drop: Patient has right foot orthotic.  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  Patient followed by Dr. Anne HahnWillis in the outpatient setting.  Dr. Anne HahnWillis had recommended MRI scan of the cervical and lumbar spine as well as EMG nerve conduction studies of the upper extremities. -Continue outpatient follow-up with Dr. Anne HahnWillis  Aortic stenosis s/p aortic valve replacement  Obesity: BMI 30.95 kg/m  DVT prophylaxis: Eliquis   Code Status: Full Family Communication: Discussed plan of care with patient and family present at bedside Disposition Plan: TBD Consults called: Neurology Admission status: Inpatient   Clydie Braunondell A Lajean Boese MD [facility] Pager [phone]   If 7PM-7AM, please contact night-coverage [email or web address] Password [email or web address]  12/12/2018, 1:40 PM

## 2018-12-12 NOTE — Progress Notes (Signed)
Patient arrived to 4C95 A&O x4. Daughter at bedside. Pt only complaining of headache. POC provided to patient. Nurse will continue to monitor.

## 2018-12-13 ENCOUNTER — Inpatient Hospital Stay (HOSPITAL_COMMUNITY): Payer: Medicare Other

## 2018-12-13 DIAGNOSIS — I6300 Cerebral infarction due to thrombosis of unspecified precerebral artery: Secondary | ICD-10-CM

## 2018-12-13 DIAGNOSIS — I6389 Other cerebral infarction: Secondary | ICD-10-CM

## 2018-12-13 LAB — GLUCOSE, CAPILLARY
Glucose-Capillary: 151 mg/dL — ABNORMAL HIGH (ref 70–99)
Glucose-Capillary: 156 mg/dL — ABNORMAL HIGH (ref 70–99)

## 2018-12-13 LAB — HEMOGLOBIN A1C
Hgb A1c MFr Bld: 8.5 % — ABNORMAL HIGH (ref 4.8–5.6)
Mean Plasma Glucose: 197.25 mg/dL

## 2018-12-13 LAB — ECHOCARDIOGRAM COMPLETE
Height: 65 in
Weight: 2976 oz

## 2018-12-13 LAB — LIPID PANEL
Cholesterol: 204 mg/dL — ABNORMAL HIGH (ref 0–200)
HDL: 39 mg/dL — ABNORMAL LOW (ref >40)
LDL Cholesterol: 124 mg/dL — ABNORMAL HIGH (ref 0–99)
Total CHOL/HDL Ratio: 5.2 RATIO
Triglycerides: 204 mg/dL — ABNORMAL HIGH (ref <150)
VLDL: 41 mg/dL — ABNORMAL HIGH (ref 0–40)

## 2018-12-13 LAB — TSH: TSH: 0.595 u[IU]/mL (ref 0.350–4.500)

## 2018-12-13 MED ORDER — ATORVASTATIN CALCIUM 40 MG PO TABS: 40.0000 mg | ORAL_TABLET | Freq: Every day | ORAL | Status: DC

## 2018-12-13 MED ORDER — ATORVASTATIN CALCIUM 20 MG PO TABS: 20.0000 mg | ORAL_TABLET | Freq: Every day | ORAL | 0 refills | Status: DC

## 2018-12-13 NOTE — TOC Benefit Eligibility Note (Addendum)
Transition of Care [facility]) Benefit Eligibility Note    Patient Details  Name: DALAN COWGER MRN: [id number] Date of Birth: 1950-11-25   Medication/Dose: Arne Cleveland  5 MG BID  Covered?: Yes  Tier: 3 Drug  Prescription Coverage Preferred Pharmacy: [facility]  AND [facility]   , 90DAY SUPPLY FOR M/O $81.00  Spoke with Person/Company/Phone Number:: KRISTINE  @ [facility] # [phone]  Co-Pay: $32.00  Prior Approval: No  Deductible: Unmet  ADDITIONAL NOTES : PRADAXA ,COVER- YES, CO-PAY- 35 % OF TOTAL COST, TIER- 4 DRUG,P/A-NO    Memory Argue Phone Number: 12/13/2018, 1:42 PM

## 2018-12-13 NOTE — Discharge Summary (Signed)
Physician Discharge Summary  Vernon Ruiz ZOX:[id number] DOB: 05/10/1950 DOA: 12/12/2018  PCP: Catha GosselinLittle, Kevin, MD  Admit date: 12/12/2018 Discharge date: 12/13/2018  Admitted From: home  Disposition:  Home   Recommendations for Outpatient Follow-up:  1. Follow up with PCP in 1-2 weeks 2. You will follow-up with neurology as scheduled.  Home Health: Not applicable Equipment/Devices: Not applicable  Discharge Condition: Stable CODE STATUS: Full code Diet recommendation: Low-salt low-carb diet  Discharge summary: 68 year old male with history of paroxysmal A. fib, obstructive sleep apnea, status post prosthetic aortic valve replacement and hypertension, poorly controlled diabetes came to the emergency room with sudden onset of headache that lasted a few hours and recovered next day, he had some confusion and disorientation.  On arrival to the ER CT scan and MRI both confirmed left PCA infarct involving medial left temporal lobe.  He did have some residual short-term memory difficulties and right upper quadrant visual field deficits.  He was admitted to the hospital and treated for acute stroke.  Acute left PCA embolic stroke secondary to known atrial fibrillation on Eliquis: Clinical findings, consistent with confusion, short-term memory loss and right upper quadrant visual field deficit, right leg weakness that has improved and has remaining right foot drop that is chronic and he is having outpatient work-up. CT head findings and MRI of the brain, left temporal occipital region with patchy involvement of left thalamus.  MRA of the head left P3 occlusion, multifocal distal stenosis.  Atherosclerosis.  MRA of the neck, 40% bilateral ICA stenosis.  Carotid Dopplers bilateral less than 39% stenosis. 2D echocardiogram, ejection fraction 65%.  No source of emboli. Antiplatelet therapy, patient on Eliquis and aspirin at home.  Discussion was done regarding switching Eliquis to Pradaxa, however not  adequately covered by his insurance provider.  Decided outpatient follow-up and discussion. LDL 124.  Patient had intolerance to Crestor and pravastatin with body ache and thigh pain.  He wants to try Lipitor, will prescribe Lipitor 20 mg.  If he gets intolerance to Lipitor, he will benefit with injectable.  Neurology follow-up. Hemoglobin A1c 8.5.  According to the patient and family it was [id number] recently and he is working up with endocrinologist on adjusting his regimen.  Since he is already following up with endocrine, will not change any diabetic regimen. Patient work with PT OT and his deficits improved.  Patient was admitted to the hospital with acute stroke needing stroke work-up, investigations and therapy evaluation as well as adjustment of medications with anticipation of hospitalization more than 2 midnights, however he was able to undergo all necessary investigations, he was able to be seen by all consults and his neurological deficit quickly resolved hence he was able to go home today with close outpatient follow-up.  Patient will be followed up at neurology office.  He was advised not to drive until seen in follow-up because of right upper quadrant visual field loss.  There is no added benefit of continuing aspirin, he will not continue.   Discharge Diagnoses:  Principal Problem:   CVA (cerebral vascular accident) (HCC) Active Problems:   Paroxysmal atrial fibrillation (HCC)   OSA (obstructive sleep apnea)   S/P AVR   HTN (hypertension)    Discharge Instructions  Discharge Instructions    Ambulatory referral to Neurology   Complete by: As directed    [facility] with Ihor AustinJessica McCue, NP in about 4 weeks. If not available, consider Dr. Delia HeadyPramod Sethi, Dr. Jamelle RushingVikram Penumali,  or Dr. Naomie Dean.   Diet - low sodium heart healthy   Complete by: As directed    Discharge instructions   Complete by: As directed    Do not drive until seen  by neurology follow up   Increase activity slowly   Complete by: As directed      Allergies as of 12/13/2018      Reactions   Penicillins Itching, Rash   All over body Has patient had a PCN reaction causing immediate rash, facial/tongue/throat swelling, SOB or lightheadedness with hypotension: yes Has patient had a PCN reaction causing severe rash involving mucus membranes or skin necrosis: no Has patient had a PCN reaction that required hospitalization no Has patient had a PCN reaction occurring within the last 10 years: no If all of the above answers are "NO", then may proceed with Cephalosporin use.   Sulfa Antibiotics Rash   Rash   Sulfamethoxazole Rash      Medication List    STOP taking these medications   ALPRAZolam 0.5 MG tablet Commonly known as: XANAX   amLODipine 5 MG tablet Commonly known as: NORVASC   aspirin EC 81 MG tablet     TAKE these medications   acetaminophen 325 MG tablet Commonly known as: TYLENOL Take 2 tablets (650 mg total) by mouth every 6 (six) hours as needed for mild pain.   atorvastatin 20 MG tablet Commonly known as: LIPITOR Take 1 tablet (20 mg total) by mouth daily at 6 PM.   Eliquis 5 MG Tabs tablet Generic drug: apixaban Take 5 mg by mouth 2 (two) times daily.   glimepiride 4 MG tablet Commonly known as: AMARYL Take 4 mg by mouth 2 (two) times daily.   ibuprofen 200 MG tablet Commonly known as: ADVIL Take 400 mg by mouth every 6 (six) hours as needed for headache.   Jardiance 25 MG Tabs tablet Generic drug: empagliflozin Take 12.5 mg by mouth daily.   losartan 100 MG tablet Commonly known as: COZAAR Take 1 tablet (100 mg total) by mouth daily.   metFORMIN 1000 MG tablet Commonly known as: GLUCOPHAGE Take 1,000 mg by mouth 2 (two) times daily.   metoprolol tartrate 50 MG tablet Commonly known as: LOPRESSOR Take 50 mg by mouth 2 (two) times daily.   TOUJEO SOLOSTAR [place] Inject 40 Units into the skin every evening.    Trulicity 1.5 MG/0.5ML Sopn Generic drug: Dulaglutide Inject 1.5 mg into the skin once a week. Sunday   VITAMIN B 12 PO Take 100 mg by mouth every other day.      Follow-up Information    [facility] Follow up in 4 day(s).   Specialty: Neurology Why: stroke clinic. office will call with appt date and time. Contact information: [address] [place] [address] [phone]         Allergies  Allergen Reactions  . Penicillins Itching and Rash    All over body  Has patient had a PCN reaction causing immediate rash, facial/tongue/throat swelling, SOB or lightheadedness with hypotension: yes Has patient had a PCN reaction causing severe rash involving mucus membranes or skin necrosis: no Has patient had a PCN reaction that required hospitalization no Has patient had a PCN reaction occurring within the last 10 years: no If all of the above answers are "NO", then may proceed with Cephalosporin use.  . Sulfa Antibiotics Rash    Rash  . Sulfamethoxazole Rash    Consultations:  Neurology  Procedures/Studies: Ct Head Wo Contrast  Result Date: 12/12/2018 CLINICAL DATA:  Stroke, follow-up. EXAM: CT HEAD WITHOUT CONTRAST TECHNIQUE: Contiguous axial images were obtained from the base of the skull through the vertex without intravenous contrast. COMPARISON:  Head CT 11/19/2015 FINDINGS: Brain: There is a 3.7 x 2.0 cm region of parenchymal hypodensity within the medial left temporal occipital lobes (series 3, image 14) consistent with a posterior cerebral artery territory acute/subacute infarction. There is no acute intracranial hemorrhage. No midline shift or extra-axial fluid collection. Mild ill-defined hypoattenuation of the cerebral white matter is nonspecific, but consistent with chronic small vessel ischemic disease. Incidentally noted cavum septum pellucidum. Mild generalized parenchymal atrophy. Vascular: No hyperdense vessel.  Atherosclerotic calcification of the carotid artery siphons and vertebrobasilar system. Skull: No calvarial fracture. Sinuses/Orbits: Mild scattered paranasal sinus mucosal thickening. No significant mastoid effusion. These results were called by telephone at the time of interpretation on 12/12/2018 at 12:51 pm to Dr. Alvira MondayERIN SCHLOSSMAN , who verbally acknowledged these results. IMPRESSION: 3.7 x 2.0 cm hypodensity within the medial left temporal occipital lobe, within the left posterior cerebral artery vascular territory, consistent with acute/subacute infarct. No acute intracranial hemorrhage or midline shift. Mild generalized parenchymal atrophy and chronic small vessel ischemic disease. Electronically Signed   By: Jackey LogeKyle  Golden   On: 12/12/2018 12:58   Mr Angio Head Wo Contrast  Result Date: 12/12/2018 CLINICAL DATA:  Initial evaluation for acute stroke. EXAM: MRI HEAD WITHOUT CONTRAST MRA HEAD WITHOUT CONTRAST MRA NECK WITHOUT AND WITH CONTRAST TECHNIQUE: Multiplanar, multiecho pulse sequences of the brain and surrounding structures were obtained without intravenous contrast. Angiographic images of the Circle of Willis were obtained using MRA technique without intravenous contrast. Angiographic images of the neck were obtained using MRA technique without and with intravenous contrast. Carotid stenosis measurements (when applicable) are obtained utilizing NASCET criteria, using the distal internal carotid diameter as the denominator. CONTRAST:  8 cc of Gadavist COMPARISON:  Prior CT from earlier the same day. FINDINGS: MRI HEAD FINDINGS Brain: Diffuse prominence of the CSF containing spaces compatible with generalized age-related cerebral atrophy. Mild scattered T2/FLAIR hyperintensity within the periventricular and deep white matter both cerebral hemispheres most consistent with chronic small vessel ischemic disease, minimal for age. 4.6 cm area of confluent restricted diffusion involving the mesial left temporal  occipital region consistent with acute left PCA territory infarct (series 2, image 28). Mild extension into the posterior left hippocampal formation. Minimal patchy involvement of the lateral left thalamus. No associated hemorrhage or mass effect. No other evidence for acute or subacute ischemia. Gray-white matter differentiation otherwise maintained. No other areas of remote cortical infarction. No foci of susceptibility artifact to suggest acute or chronic intracranial hemorrhage. No mass lesion, midline shift or mass effect. Ventricles normal in size without evidence for hydrocephalus. Cavum et septum pellucidum noted. Pituitary gland suprasellar region within normal limits. Midline structures intact. Vascular: Major intracranial vascular flow voids well maintained and normal in appearance. Skull and upper cervical spine: Craniocervical junction within normal limits. Degenerative disc bulging noted at C3-4 with resultant mild-to-moderate spinal stenosis (series 3, image 19). Bone marrow signal intensity within normal limits. No scalp soft tissue abnormality. Sinuses/Orbits: Globes and orbital soft tissues within normal limits. Paranasal sinuses are clear. Trace bilateral mastoid effusions, of doubtful significance. Other: None. MRA HEAD FINDINGS ANTERIOR CIRCULATION: Distal cervical segments of the internal carotid arteries are patent with symmetric antegrade flow. Distal cervical left ICA tortuous. Scattered atheromatous irregularity within the petrous, cavernous, and supraclinoid segments bilaterally.  There is a short-segment moderate to severe stenosis involving the vertical petrous right ICA of up to 70% by NASCET criteria (series 5, image 113). No other hemodynamically significant stenosis. ICA termini well perfused. Atheromatous irregularity throughout the A1 segments without high-grade stenosis. Left A1 dominant. Normal anterior communicating artery complex. Anterior cerebral arteries widely patent to  their distal aspects without stenosis. Multifocal atheromatous irregularity throughout the M1 segments bilaterally without high-grade stenosis. Normal MCA bifurcations. On the left, multifocal severe proximal M2 stenoses involving both superior and inferior divisions noted (series 552, image 10) no significant stenosis seen about the proximal right M2 branches. Distal MCA branches well perfused and symmetric bilaterally. POSTERIOR CIRCULATION: Right vertebral artery slightly dominant and is patent to the vertebrobasilar junction without stenosis. Short-segment moderate stenosis involving the distal left V4 segment just prior to the vertebrobasilar junction (series 5, image 128). Left PICA patent proximally. Right PICA not seen. Short-segment moderate stenosis noted at the proximal basilar artery, just prior to the takeoff of the anterior inferior cerebral arteries (series 5, image 123). Basilar otherwise widely patent to its distal aspect. Superior cerebral arteries patent bilaterally. Both of the posterior cerebral arteries primarily supplied via the basilar. Scattered atheromatous irregularity within the right PCA with smooth moderate narrowing of the mid right P2 segment (series 555, image 12). Right PCA is perfused to its distal aspect. Short-segment moderate proximal left P2 stenosis (series 555, image 9). Distally, there is abrupt occlusion of a proximal left P3 segment (series 555, image 8), acute in nature given the acute left PCA territory infarct. No intracranial aneurysm. MRA NECK FINDINGS Source images reviewed. Visualized aortic arch of normal caliber with normal 3 vessel morphology. No hemodynamically significant stenosis seen about the origin of the great vessels. Visualized left subclavian artery widely patent. Short-segment stenosis of up to approximately 40% seen at the origin of the right subclavian artery (series 1201, image 45). Right subclavian otherwise widely patent. Right common carotid  artery widely patent from its origin to the bifurcation without stenosis. Atheromatous irregularity about the proximal right ICA with associated mild stenosis of up to 40% by NASCET criteria. Right ICA mildly tortuous but otherwise patent to the skull base without stenosis or occlusion. Left common carotid artery patent from its origin to the bifurcation without stenosis. Atheromatous irregularity about the origin of the left ICA with associated mild stenosis of up to approximately 40% by NASCET criteria. Left ICA tortuous but otherwise widely patent to the skull base without stenosis or occlusion. Both vertebral arteries arise from the subclavian arteries. Right vertebral artery slightly dominant. Vertebral arteries widely patent within the neck without stenosis or occlusion. IMPRESSION: MRI HEAD IMPRESSION: 1. Moderate sized acute ischemic left PCA territory infarct involving the mesial left temporal occipital region with patchy involvement of the left thalamus as above. No associated hemorrhage or mass effect. 2. Underlying age-related cerebral atrophy with mild chronic microvascular ischemic disease. 3. Broad posterior disc protrusion at C3-4 with resultant mild-to-moderate spinal stenosis. Finding could be further assessed with dedicated MRI of the cervical spine as clinically desired. MRA HEAD IMPRESSION: 1. Acute left P3 occlusion, corresponding with the acute left PCA territory infarct. 2. Moderate multifocal stenoses involving the distal left V4 segment and proximal basilar artery as above. 3. Short-segment 70% stenosis involving the vertical petrous right ICA. 4. Multifocal severe proximal left M2 stenoses as above. 5. Moderate atherosclerotic irregularity elsewhere throughout the intracranial circulation. No other hemodynamically significant or correctable stenosis identified. MRA NECK IMPRESSION: 1. Atheromatous irregularity  with up to approximately 40% stenosis about the origins of the internal carotid  arteries bilaterally. Otherwise wide patency of both carotid artery systems within the neck. 2. Wide patency of both vertebral arteries within the neck. Right vertebral artery slightly dominant. 3. Short-segment 40% stenosis at the origin of the right subclavian artery. Electronically Signed   By: Rise Mu M.D.   On: 12/12/2018 20:46   Mr Angio Neck W Wo Contrast  Result Date: 12/12/2018 CLINICAL DATA:  Initial evaluation for acute stroke. EXAM: MRI HEAD WITHOUT CONTRAST MRA HEAD WITHOUT CONTRAST MRA NECK WITHOUT AND WITH CONTRAST TECHNIQUE: Multiplanar, multiecho pulse sequences of the brain and surrounding structures were obtained without intravenous contrast. Angiographic images of the Circle of Willis were obtained using MRA technique without intravenous contrast. Angiographic images of the neck were obtained using MRA technique without and with intravenous contrast. Carotid stenosis measurements (when applicable) are obtained utilizing NASCET criteria, using the distal internal carotid diameter as the denominator. CONTRAST:  8 cc of Gadavist COMPARISON:  Prior CT from earlier the same day. FINDINGS: MRI HEAD FINDINGS Brain: Diffuse prominence of the CSF containing spaces compatible with generalized age-related cerebral atrophy. Mild scattered T2/FLAIR hyperintensity within the periventricular and deep white matter both cerebral hemispheres most consistent with chronic small vessel ischemic disease, minimal for age. 4.6 cm area of confluent restricted diffusion involving the mesial left temporal occipital region consistent with acute left PCA territory infarct (series 2, image 28). Mild extension into the posterior left hippocampal formation. Minimal patchy involvement of the lateral left thalamus. No associated hemorrhage or mass effect. No other evidence for acute or subacute ischemia. Gray-white matter differentiation otherwise maintained. No other areas of remote cortical infarction. No foci  of susceptibility artifact to suggest acute or chronic intracranial hemorrhage. No mass lesion, midline shift or mass effect. Ventricles normal in size without evidence for hydrocephalus. Cavum et septum pellucidum noted. Pituitary gland suprasellar region within normal limits. Midline structures intact. Vascular: Major intracranial vascular flow voids well maintained and normal in appearance. Skull and upper cervical spine: Craniocervical junction within normal limits. Degenerative disc bulging noted at C3-4 with resultant mild-to-moderate spinal stenosis (series 3, image 19). Bone marrow signal intensity within normal limits. No scalp soft tissue abnormality. Sinuses/Orbits: Globes and orbital soft tissues within normal limits. Paranasal sinuses are clear. Trace bilateral mastoid effusions, of doubtful significance. Other: None. MRA HEAD FINDINGS ANTERIOR CIRCULATION: Distal cervical segments of the internal carotid arteries are patent with symmetric antegrade flow. Distal cervical left ICA tortuous. Scattered atheromatous irregularity within the petrous, cavernous, and supraclinoid segments bilaterally. There is a short-segment moderate to severe stenosis involving the vertical petrous right ICA of up to 70% by NASCET criteria (series 5, image 113). No other hemodynamically significant stenosis. ICA termini well perfused. Atheromatous irregularity throughout the A1 segments without high-grade stenosis. Left A1 dominant. Normal anterior communicating artery complex. Anterior cerebral arteries widely patent to their distal aspects without stenosis. Multifocal atheromatous irregularity throughout the M1 segments bilaterally without high-grade stenosis. Normal MCA bifurcations. On the left, multifocal severe proximal M2 stenoses involving both superior and inferior divisions noted (series 552, image 10) no significant stenosis seen about the proximal right M2 branches. Distal MCA branches well perfused and symmetric  bilaterally. POSTERIOR CIRCULATION: Right vertebral artery slightly dominant and is patent to the vertebrobasilar junction without stenosis. Short-segment moderate stenosis involving the distal left V4 segment just prior to the vertebrobasilar junction (series 5, image 128). Left PICA patent proximally. Right PICA not seen.  Short-segment moderate stenosis noted at the proximal basilar artery, just prior to the takeoff of the anterior inferior cerebral arteries (series 5, image 123). Basilar otherwise widely patent to its distal aspect. Superior cerebral arteries patent bilaterally. Both of the posterior cerebral arteries primarily supplied via the basilar. Scattered atheromatous irregularity within the right PCA with smooth moderate narrowing of the mid right P2 segment (series 555, image 12). Right PCA is perfused to its distal aspect. Short-segment moderate proximal left P2 stenosis (series 555, image 9). Distally, there is abrupt occlusion of a proximal left P3 segment (series 555, image 8), acute in nature given the acute left PCA territory infarct. No intracranial aneurysm. MRA NECK FINDINGS Source images reviewed. Visualized aortic arch of normal caliber with normal 3 vessel morphology. No hemodynamically significant stenosis seen about the origin of the great vessels. Visualized left subclavian artery widely patent. Short-segment stenosis of up to approximately 40% seen at the origin of the right subclavian artery (series 1201, image 45). Right subclavian otherwise widely patent. Right common carotid artery widely patent from its origin to the bifurcation without stenosis. Atheromatous irregularity about the proximal right ICA with associated mild stenosis of up to 40% by NASCET criteria. Right ICA mildly tortuous but otherwise patent to the skull base without stenosis or occlusion. Left common carotid artery patent from its origin to the bifurcation without stenosis. Atheromatous irregularity about the  origin of the left ICA with associated mild stenosis of up to approximately 40% by NASCET criteria. Left ICA tortuous but otherwise widely patent to the skull base without stenosis or occlusion. Both vertebral arteries arise from the subclavian arteries. Right vertebral artery slightly dominant. Vertebral arteries widely patent within the neck without stenosis or occlusion. IMPRESSION: MRI HEAD IMPRESSION: 1. Moderate sized acute ischemic left PCA territory infarct involving the mesial left temporal occipital region with patchy involvement of the left thalamus as above. No associated hemorrhage or mass effect. 2. Underlying age-related cerebral atrophy with mild chronic microvascular ischemic disease. 3. Broad posterior disc protrusion at C3-4 with resultant mild-to-moderate spinal stenosis. Finding could be further assessed with dedicated MRI of the cervical spine as clinically desired. MRA HEAD IMPRESSION: 1. Acute left P3 occlusion, corresponding with the acute left PCA territory infarct. 2. Moderate multifocal stenoses involving the distal left V4 segment and proximal basilar artery as above. 3. Short-segment 70% stenosis involving the vertical petrous right ICA. 4. Multifocal severe proximal left M2 stenoses as above. 5. Moderate atherosclerotic irregularity elsewhere throughout the intracranial circulation. No other hemodynamically significant or correctable stenosis identified. MRA NECK IMPRESSION: 1. Atheromatous irregularity with up to approximately 40% stenosis about the origins of the internal carotid arteries bilaterally. Otherwise wide patency of both carotid artery systems within the neck. 2. Wide patency of both vertebral arteries within the neck. Right vertebral artery slightly dominant. 3. Short-segment 40% stenosis at the origin of the right subclavian artery. Electronically Signed   By: Rise MuBenjamin  McClintock M.D.   On: 12/12/2018 20:46   Mr Brain Wo Contrast  Result Date: 12/12/2018 CLINICAL  DATA:  Initial evaluation for acute stroke. EXAM: MRI HEAD WITHOUT CONTRAST MRA HEAD WITHOUT CONTRAST MRA NECK WITHOUT AND WITH CONTRAST TECHNIQUE: Multiplanar, multiecho pulse sequences of the brain and surrounding structures were obtained without intravenous contrast. Angiographic images of the Circle of Willis were obtained using MRA technique without intravenous contrast. Angiographic images of the neck were obtained using MRA technique without and with intravenous contrast. Carotid stenosis measurements (when applicable) are obtained utilizing NASCET criteria,  using the distal internal carotid diameter as the denominator. CONTRAST:  8 cc of Gadavist COMPARISON:  Prior CT from earlier the same day. FINDINGS: MRI HEAD FINDINGS Brain: Diffuse prominence of the CSF containing spaces compatible with generalized age-related cerebral atrophy. Mild scattered T2/FLAIR hyperintensity within the periventricular and deep white matter both cerebral hemispheres most consistent with chronic small vessel ischemic disease, minimal for age. 4.6 cm area of confluent restricted diffusion involving the mesial left temporal occipital region consistent with acute left PCA territory infarct (series 2, image 28). Mild extension into the posterior left hippocampal formation. Minimal patchy involvement of the lateral left thalamus. No associated hemorrhage or mass effect. No other evidence for acute or subacute ischemia. Gray-white matter differentiation otherwise maintained. No other areas of remote cortical infarction. No foci of susceptibility artifact to suggest acute or chronic intracranial hemorrhage. No mass lesion, midline shift or mass effect. Ventricles normal in size without evidence for hydrocephalus. Cavum et septum pellucidum noted. Pituitary gland suprasellar region within normal limits. Midline structures intact. Vascular: Major intracranial vascular flow voids well maintained and normal in appearance. Skull and upper  cervical spine: Craniocervical junction within normal limits. Degenerative disc bulging noted at C3-4 with resultant mild-to-moderate spinal stenosis (series 3, image 19). Bone marrow signal intensity within normal limits. No scalp soft tissue abnormality. Sinuses/Orbits: Globes and orbital soft tissues within normal limits. Paranasal sinuses are clear. Trace bilateral mastoid effusions, of doubtful significance. Other: None. MRA HEAD FINDINGS ANTERIOR CIRCULATION: Distal cervical segments of the internal carotid arteries are patent with symmetric antegrade flow. Distal cervical left ICA tortuous. Scattered atheromatous irregularity within the petrous, cavernous, and supraclinoid segments bilaterally. There is a short-segment moderate to severe stenosis involving the vertical petrous right ICA of up to 70% by NASCET criteria (series 5, image 113). No other hemodynamically significant stenosis. ICA termini well perfused. Atheromatous irregularity throughout the A1 segments without high-grade stenosis. Left A1 dominant. Normal anterior communicating artery complex. Anterior cerebral arteries widely patent to their distal aspects without stenosis. Multifocal atheromatous irregularity throughout the M1 segments bilaterally without high-grade stenosis. Normal MCA bifurcations. On the left, multifocal severe proximal M2 stenoses involving both superior and inferior divisions noted (series 552, image 10) no significant stenosis seen about the proximal right M2 branches. Distal MCA branches well perfused and symmetric bilaterally. POSTERIOR CIRCULATION: Right vertebral artery slightly dominant and is patent to the vertebrobasilar junction without stenosis. Short-segment moderate stenosis involving the distal left V4 segment just prior to the vertebrobasilar junction (series 5, image 128). Left PICA patent proximally. Right PICA not seen. Short-segment moderate stenosis noted at the proximal basilar artery, just prior to the  takeoff of the anterior inferior cerebral arteries (series 5, image 123). Basilar otherwise widely patent to its distal aspect. Superior cerebral arteries patent bilaterally. Both of the posterior cerebral arteries primarily supplied via the basilar. Scattered atheromatous irregularity within the right PCA with smooth moderate narrowing of the mid right P2 segment (series 555, image 12). Right PCA is perfused to its distal aspect. Short-segment moderate proximal left P2 stenosis (series 555, image 9). Distally, there is abrupt occlusion of a proximal left P3 segment (series 555, image 8), acute in nature given the acute left PCA territory infarct. No intracranial aneurysm. MRA NECK FINDINGS Source images reviewed. Visualized aortic arch of normal caliber with normal 3 vessel morphology. No hemodynamically significant stenosis seen about the origin of the great vessels. Visualized left subclavian artery widely patent. Short-segment stenosis of up to approximately 40% seen at  the origin of the right subclavian artery (series 1201, image 45). Right subclavian otherwise widely patent. Right common carotid artery widely patent from its origin to the bifurcation without stenosis. Atheromatous irregularity about the proximal right ICA with associated mild stenosis of up to 40% by NASCET criteria. Right ICA mildly tortuous but otherwise patent to the skull base without stenosis or occlusion. Left common carotid artery patent from its origin to the bifurcation without stenosis. Atheromatous irregularity about the origin of the left ICA with associated mild stenosis of up to approximately 40% by NASCET criteria. Left ICA tortuous but otherwise widely patent to the skull base without stenosis or occlusion. Both vertebral arteries arise from the subclavian arteries. Right vertebral artery slightly dominant. Vertebral arteries widely patent within the neck without stenosis or occlusion. IMPRESSION: MRI HEAD IMPRESSION: 1.  Moderate sized acute ischemic left PCA territory infarct involving the mesial left temporal occipital region with patchy involvement of the left thalamus as above. No associated hemorrhage or mass effect. 2. Underlying age-related cerebral atrophy with mild chronic microvascular ischemic disease. 3. Broad posterior disc protrusion at C3-4 with resultant mild-to-moderate spinal stenosis. Finding could be further assessed with dedicated MRI of the cervical spine as clinically desired. MRA HEAD IMPRESSION: 1. Acute left P3 occlusion, corresponding with the acute left PCA territory infarct. 2. Moderate multifocal stenoses involving the distal left V4 segment and proximal basilar artery as above. 3. Short-segment 70% stenosis involving the vertical petrous right ICA. 4. Multifocal severe proximal left M2 stenoses as above. 5. Moderate atherosclerotic irregularity elsewhere throughout the intracranial circulation. No other hemodynamically significant or correctable stenosis identified. MRA NECK IMPRESSION: 1. Atheromatous irregularity with up to approximately 40% stenosis about the origins of the internal carotid arteries bilaterally. Otherwise wide patency of both carotid artery systems within the neck. 2. Wide patency of both vertebral arteries within the neck. Right vertebral artery slightly dominant. 3. Short-segment 40% stenosis at the origin of the right subclavian artery. Electronically Signed   By: Rise Mu M.D.   On: 12/12/2018 20:46   Vas US Carotid (at [facility])  Result Date: 12/13/2018 Carotid Arterial Duplex Study Indications:  CVA. Risk Factors: Hypertension, Diabetes, coronary artery disease. Performing Technologist: Gertie Fey MHA, RDMS, RVT, RDCS  Examination Guidelines: A complete evaluation includes B-mode imaging, spectral Doppler, color Doppler, and power Doppler as needed of all accessible portions of each vessel. Bilateral testing is considered an integral part of a  complete examination. Limited examinations for reoccurring indications may be performed as noted.  Right Carotid Findings: +----------+--------+--------+--------+---------------------+------------------+           PSV cm/sEDV cm/sStenosisPlaque Description   Comments           +----------+--------+--------+--------+---------------------+------------------+ CCA Prox  97      9                                    intimal thickening +----------+--------+--------+--------+---------------------+------------------+ CCA Distal60      11                                   intimal thickening +----------+--------+--------+--------+---------------------+------------------+ ICA Prox  57      10              heterogenous,  irregular and                                                             calcific                                +----------+--------+--------+--------+---------------------+------------------+ ICA Distal68      19                                                      +----------+--------+--------+--------+---------------------+------------------+ ECA       89      8               smooth and                                                                heterogenous                            +----------+--------+--------+--------+---------------------+------------------+ +----------+--------+-------+----------------+-------------------+           PSV cm/sEDV cmsDescribe        Arm Pressure (mmHG) +----------+--------+-------+----------------+-------------------+ [id number]            Multiphasic, WNL                    +----------+--------+-------+----------------+-------------------+ +---------+--------+--+--------+--+---------+ VertebralPSV cm/s47EDV cm/s11Antegrade +---------+--------+--+--------+--+---------+  Left Carotid Findings:  +----------+-------+-------+--------+---------------------------------+--------+           PSV    EDV    StenosisPlaque Description               Comments           cm/s   cm/s                                                     +----------+-------+-------+--------+---------------------------------+--------+ CCA Prox  91     12             smooth and heterogenous                   +----------+-------+-------+--------+---------------------------------+--------+ CCA Distal81     13                                                       +----------+-------+-------+--------+---------------------------------+--------+ ICA Prox  100    30             heterogenous, irregular and  calcific                                  +----------+-------+-------+--------+---------------------------------+--------+ ICA Distal73     20                                                       +----------+-------+-------+--------+---------------------------------+--------+ ECA       114    16             smooth and heterogenous                   +----------+-------+-------+--------+---------------------------------+--------+ +----------+--------+--------+----------------+-------------------+           PSV cm/sEDV cm/sDescribe        Arm Pressure (mmHG) +----------+--------+--------+----------------+-------------------+ [id number]             Multiphasic, WNL                    +----------+--------+--------+----------------+-------------------+ +---------+--------+--+--------+--+---------+ VertebralPSV cm/s88EDV cm/s16Antegrade +---------+--------+--+--------+--+---------+  Summary: Right Carotid: Velocities in the right ICA are consistent with a 1-39% stenosis. Left Carotid: Velocities in the left ICA are consistent with a 1-39% stenosis. Vertebrals:  Bilateral vertebral arteries demonstrate antegrade flow. Subclavians:  Normal flow hemodynamics were seen in bilateral subclavian              arteries. *See table(s) above for measurements and observations.     Preliminary      Subjective: Patient seen and examined multiple times today.  Family at the bedside.  Patient wanting to go home since morning.  He underwent various investigations.   Discharge Exam: Vitals:   12/13/18 0700 12/13/18 1231  BP: (!) 136/115 (!) 146/72  Pulse:  63  Resp:  16  Temp: 97.6 F (36.4 C) 98.5 F (36.9 C)  SpO2:  96%   Vitals:   12/13/18 0300 12/13/18 0342 12/13/18 0700 12/13/18 1231  BP: (!) 180/86 (!) 164/86 (!) 136/115 (!) 146/72  Pulse: 64 66  63  Resp: 14 18  16   Temp: 97.6 F (36.4 C) 97.6 F (36.4 C) 97.6 F (36.4 C) 98.5 F (36.9 C)  TempSrc: Oral Oral  Oral  SpO2: 97% 95%  96%  Weight:      Height:        General: Pt is alert, awake, not in acute distress Cardiovascular: RRR, S1/S2 +, no rubs, no gallops Respiratory: CTA bilaterally, no wheezing, no rhonchi Abdominal: Soft, NT, ND, bowel sounds + Extremities: no edema, no cyanosis Cranial nerves; right superior quadrant visual field loss. Right foot drop with ankle dorsiflexion 2/5, sensory is normal.  Other motor examinations normal.   The results of significant diagnostics from this hospitalization (including imaging, microbiology, ancillary and laboratory) are listed below for reference.     Microbiology: Recent Results (from the past 240 hour(s))  SARS CORONAVIRUS 2 (TAT 6-24 HRS) Nasopharyngeal Nasopharyngeal Swab     Status: None   Collection Time: 12/12/18  1:44 PM   Specimen: Nasopharyngeal Swab  Result Value Ref Range Status   SARS Coronavirus 2 NEGATIVE NEGATIVE Final    Comment: (NOTE) SARS-CoV-2 target nucleic acids are NOT DETECTED. The SARS-CoV-2 RNA is generally detectable in upper and lower respiratory specimens during the acute phase of infection. Negative results do not preclude  SARS-CoV-2 infection, do not rule  out co-infections with other pathogens, and should not be used as the sole basis for treatment or other patient management decisions. Negative results must be combined with clinical observations, patient history, and epidemiological information. The expected result is Negative. Fact Sheet for Patients: HairSlick.no Fact Sheet for Healthcare Providers: quierodirigir.com This test is not yet approved or cleared by the [place] FDA and  has been authorized for detection and/or diagnosis of SARS-CoV-2 by FDA under an Emergency Use Authorization (EUA). This EUA will remain  in effect (meaning this test can be used) for the duration of the COVID-19 declaration under Section 56 4(b)(1) of the Act, 21 U.S.C. section 360bbb-3(b)(1), unless the authorization is terminated or revoked sooner. Performed at [facility], [address] [place], [place] [address]      Labs: BNP (last 3 results) No results for input(s): BNP in the last 8760 hours. Basic Metabolic Panel: Recent Labs  Lab 12/12/18 1028 12/12/18 1148  NA 139 137  K 4.7 4.3  CL 101 101  CO2 22 22  GLUCOSE 122* 140*  BUN 21 20  CREATININE 1.03 0.97  CALCIUM 9.6 9.6   Liver Function Tests: Recent Labs  Lab 12/12/18 1028 12/12/18 1148  AST 22 28  ALT 24 26  ALKPHOS 49 45  BILITOT 0.3 0.6  PROT 6.6 7.2  ALBUMIN 4.5 4.1   No results for input(s): LIPASE, AMYLASE in the last 168 hours. No results for input(s): AMMONIA in the last 168 hours. CBC: Recent Labs  Lab 12/12/18 1148  WBC 10.1  HGB 14.9  HCT 47.0  MCV 81.2  PLT 260   Cardiac Enzymes: Recent Labs  Lab 12/12/18 1028  CKTOTAL 504*   BNP: Invalid input(s): POCBNP CBG: Recent Labs  Lab 12/12/18 1307 12/12/18 1633 12/12/18 2130 12/13/18 0612 12/13/18 1227  GLUCAP 106* 88 217* 151* 156*   D-Dimer No results for input(s): DDIMER in the last 72 hours. Hgb A1c Recent Labs     12/13/18 0322  HGBA1C 8.5*   Lipid Profile Recent Labs    12/13/18 0322  CHOL 204*  HDL 39*  LDLCALC 124*  TRIG 204*  CHOLHDL 5.2   Thyroid function studies Recent Labs    12/13/18 0322  TSH 0.595   Anemia work up Recent Labs    12/12/18 1028  VITAMINB12 429   Urinalysis    Component Value Date/Time   COLORURINE YELLOW 09/30/2016 1100   APPEARANCEUR CLEAR 09/30/2016 1100   LABSPEC 1.024 09/30/2016 1100   PHURINE 5.0 09/30/2016 1100   GLUCOSEU >=500 (A) 09/30/2016 1100   HGBUR NEGATIVE 09/30/2016 1100   BILIRUBINUR NEGATIVE 09/30/2016 1100   KETONESUR NEGATIVE 09/30/2016 1100   PROTEINUR NEGATIVE 09/30/2016 1100   NITRITE NEGATIVE 09/30/2016 1100   LEUKOCYTESUR NEGATIVE 09/30/2016 1100   Sepsis Labs Invalid input(s): PROCALCITONIN,  WBC,  LACTICIDVEN Microbiology Recent Results (from the past 240 hour(s))  SARS CORONAVIRUS 2 (TAT 6-24 HRS) Nasopharyngeal Nasopharyngeal Swab     Status: None   Collection Time: 12/12/18  1:44 PM   Specimen: Nasopharyngeal Swab  Result Value Ref Range Status   SARS Coronavirus 2 NEGATIVE NEGATIVE Final    Comment: (NOTE) SARS-CoV-2 target nucleic acids are NOT DETECTED. The SARS-CoV-2 RNA is generally detectable in upper and lower respiratory specimens during the acute phase of infection. Negative results do not preclude SARS-CoV-2 infection, do not rule out co-infections with other pathogens, and should not be used as the sole basis  for treatment or other patient management decisions. Negative results must be combined with clinical observations, patient history, and epidemiological information. The expected result is Negative. Fact Sheet for Patients: HairSlick.no Fact Sheet for Healthcare Providers: quierodirigir.com This test is not yet approved or cleared by the [place] FDA and  has been authorized for detection and/or diagnosis of SARS-CoV-2 by FDA under an  Emergency Use Authorization (EUA). This EUA will remain  in effect (meaning this test can be used) for the duration of the COVID-19 declaration under Section 56 4(b)(1) of the Act, 21 U.S.C. section 360bbb-3(b)(1), unless the authorization is terminated or revoked sooner. Performed at [facility], [address], [place], [place] [address]      Time coordinating discharge: 35 minutes  SIGNED:   Dorcas Carrow, MD  [facility] 12/13/2018, 3:44 PM

## 2018-12-13 NOTE — Progress Notes (Signed)
STROKE TEAM PROGRESS NOTE   INTERVAL HISTORY Patient presented with sudden onset of headache which lasted a few hours and recovered the next day he had some confusion and disorientation and CT scan and MRI both confirm left PCA infarct involving medial left temporal lobe.  He still has some residual short-term memory difficulties and right upper quadrant visual field deficits which are unchanged.  Vitals:   12/13/18 0100 12/13/18 0300 12/13/18 0342 12/13/18 0700  BP: 136/74 (!) 180/86 (!) 164/86 (!) 136/115  Pulse: 63 64 66   Resp: Temp: 98.1 F (36.7 C) 97.6 F (36.4 C) 97.6 F (36.4 C) 97.6 F (36.4 C)  TempSrc: Oral Oral Oral   SpO2: 93% 97% 95%   Weight:      Height:        CBC:  Recent Labs  Lab 12/12/18 1148  WBC 10.1  HGB 14.9  HCT 47.0  MCV 81.2  PLT 260    Basic Metabolic Panel:  Recent Labs  Lab 12/12/18 1028 12/12/18 1148  NA 139 137  K 4.7 4.3  CL 101 101  CO2 22 22  GLUCOSE 122* 140*  BUN 21 20  CREATININE 1.03 0.97  CALCIUM 9.6 9.6   Lipid Panel:     Component Value Date/Time   CHOL 204 (H) 12/13/2018 0322   TRIG 204 (H) 12/13/2018 0322   HDL 39 (L) 12/13/2018 0322   CHOLHDL 5.2 12/13/2018 0322   VLDL 41 (H) 12/13/2018 0322   LDLCALC 124 (H) 12/13/2018 0322   HgbA1c:  Lab Results  Component Value Date   HGBA1C 8.5 (H) 12/13/2018   Urine Drug Screen: No results found for: LABOPIA, COCAINSCRNUR, LABBENZ, AMPHETMU, THCU, LABBARB  Alcohol Level No results found for: ETH  IMAGING Ct Head Wo Contrast  Result Date: 12/12/2018 CLINICAL DATA:  Stroke, follow-up. EXAM: CT HEAD WITHOUT CONTRAST TECHNIQUE: Contiguous axial images were obtained from the base of the skull through the vertex without intravenous contrast. COMPARISON:  Head CT 11/19/2015 FINDINGS: Brain: There is a 3.7 x 2.0 cm region of parenchymal hypodensity within the medial left temporal occipital lobes (series 3, image 14) consistent with a posterior cerebral artery  territory acute/subacute infarction. There is no acute intracranial hemorrhage. No midline shift or extra-axial fluid collection. Mild ill-defined hypoattenuation of the cerebral white matter is nonspecific, but consistent with chronic small vessel ischemic disease. Incidentally noted cavum septum pellucidum. Mild generalized parenchymal atrophy. Vascular: No hyperdense vessel. Atherosclerotic calcification of the carotid artery siphons and vertebrobasilar system. Skull: No calvarial fracture. Sinuses/Orbits: Mild scattered paranasal sinus mucosal thickening. No significant mastoid effusion. These results were called by telephone at the time of interpretation on 12/12/2018 at 12:51 pm to Dr. Alvira Monday , who verbally acknowledged these results. IMPRESSION: 3.7 x 2.0 cm hypodensity within the medial left temporal occipital lobe, within the left posterior cerebral artery vascular territory, consistent with acute/subacute infarct. No acute intracranial hemorrhage or midline shift. Mild generalized parenchymal atrophy and chronic small vessel ischemic disease. Electronically Signed   By: Jackey Loge   On: 12/12/2018 12:58   Mr Angio Head Wo Contrast  Result Date: 12/12/2018 CLINICAL DATA:  Initial evaluation for acute stroke. EXAM: MRI HEAD WITHOUT CONTRAST MRA HEAD WITHOUT CONTRAST MRA NECK WITHOUT AND WITH CONTRAST TECHNIQUE: Multiplanar, multiecho pulse sequences of the brain and surrounding structures were obtained without intravenous contrast. Angiographic images of the Circle of Willis were obtained using MRA technique without intravenous contrast. Angiographic images of the  neck were obtained using MRA technique without and with intravenous contrast. Carotid stenosis measurements (when applicable) are obtained utilizing NASCET criteria, using the distal internal carotid diameter as the denominator. CONTRAST:  8 cc of Gadavist COMPARISON:  Prior CT from earlier the same day. FINDINGS: MRI HEAD FINDINGS  Brain: Diffuse prominence of the CSF containing spaces compatible with generalized age-related cerebral atrophy. Mild scattered T2/FLAIR hyperintensity within the periventricular and deep white matter both cerebral hemispheres most consistent with chronic small vessel ischemic disease, minimal for age. 4.6 cm area of confluent restricted diffusion involving the mesial left temporal occipital region consistent with acute left PCA territory infarct (series 2, image 28). Mild extension into the posterior left hippocampal formation. Minimal patchy involvement of the lateral left thalamus. No associated hemorrhage or mass effect. No other evidence for acute or subacute ischemia. Gray-white matter differentiation otherwise maintained. No other areas of remote cortical infarction. No foci of susceptibility artifact to suggest acute or chronic intracranial hemorrhage. No mass lesion, midline shift or mass effect. Ventricles normal in size without evidence for hydrocephalus. Cavum et septum pellucidum noted. Pituitary gland suprasellar region within normal limits. Midline structures intact. Vascular: Major intracranial vascular flow voids well maintained and normal in appearance. Skull and upper cervical spine: Craniocervical junction within normal limits. Degenerative disc bulging noted at C3-4 with resultant mild-to-moderate spinal stenosis (series 3, image 19). Bone marrow signal intensity within normal limits. No scalp soft tissue abnormality. Sinuses/Orbits: Globes and orbital soft tissues within normal limits. Paranasal sinuses are clear. Trace bilateral mastoid effusions, of doubtful significance. Other: None. MRA HEAD FINDINGS ANTERIOR CIRCULATION: Distal cervical segments of the internal carotid arteries are patent with symmetric antegrade flow. Distal cervical left ICA tortuous. Scattered atheromatous irregularity within the petrous, cavernous, and supraclinoid segments bilaterally. There is a short-segment  moderate to severe stenosis involving the vertical petrous right ICA of up to 70% by NASCET criteria (series 5, image 113). No other hemodynamically significant stenosis. ICA termini well perfused. Atheromatous irregularity throughout the A1 segments without high-grade stenosis. Left A1 dominant. Normal anterior communicating artery complex. Anterior cerebral arteries widely patent to their distal aspects without stenosis. Multifocal atheromatous irregularity throughout the M1 segments bilaterally without high-grade stenosis. Normal MCA bifurcations. On the left, multifocal severe proximal M2 stenoses involving both superior and inferior divisions noted (series 552, image 10) no significant stenosis seen about the proximal right M2 branches. Distal MCA branches well perfused and symmetric bilaterally. POSTERIOR CIRCULATION: Right vertebral artery slightly dominant and is patent to the vertebrobasilar junction without stenosis. Short-segment moderate stenosis involving the distal left V4 segment just prior to the vertebrobasilar junction (series 5, image 128). Left PICA patent proximally. Right PICA not seen. Short-segment moderate stenosis noted at the proximal basilar artery, just prior to the takeoff of the anterior inferior cerebral arteries (series 5, image 123). Basilar otherwise widely patent to its distal aspect. Superior cerebral arteries patent bilaterally. Both of the posterior cerebral arteries primarily supplied via the basilar. Scattered atheromatous irregularity within the right PCA with smooth moderate narrowing of the mid right P2 segment (series 555, image 12). Right PCA is perfused to its distal aspect. Short-segment moderate proximal left P2 stenosis (series 555, image 9). Distally, there is abrupt occlusion of a proximal left P3 segment (series 555, image 8), acute in nature given the acute left PCA territory infarct. No intracranial aneurysm. MRA NECK FINDINGS Source images reviewed. Visualized  aortic arch of normal caliber with normal 3 vessel morphology. No hemodynamically significant stenosis seen  about the origin of the great vessels. Visualized left subclavian artery widely patent. Short-segment stenosis of up to approximately 40% seen at the origin of the right subclavian artery (series 1201, image 45). Right subclavian otherwise widely patent. Right common carotid artery widely patent from its origin to the bifurcation without stenosis. Atheromatous irregularity about the proximal right ICA with associated mild stenosis of up to 40% by NASCET criteria. Right ICA mildly tortuous but otherwise patent to the skull base without stenosis or occlusion. Left common carotid artery patent from its origin to the bifurcation without stenosis. Atheromatous irregularity about the origin of the left ICA with associated mild stenosis of up to approximately 40% by NASCET criteria. Left ICA tortuous but otherwise widely patent to the skull base without stenosis or occlusion. Both vertebral arteries arise from the subclavian arteries. Right vertebral artery slightly dominant. Vertebral arteries widely patent within the neck without stenosis or occlusion. IMPRESSION: MRI HEAD IMPRESSION: 1. Moderate sized acute ischemic left PCA territory infarct involving the mesial left temporal occipital region with patchy involvement of the left thalamus as above. No associated hemorrhage or mass effect. 2. Underlying age-related cerebral atrophy with mild chronic microvascular ischemic disease. 3. Broad posterior disc protrusion at C3-4 with resultant mild-to-moderate spinal stenosis. Finding could be further assessed with dedicated MRI of the cervical spine as clinically desired. MRA HEAD IMPRESSION: 1. Acute left P3 occlusion, corresponding with the acute left PCA territory infarct. 2. Moderate multifocal stenoses involving the distal left V4 segment and proximal basilar artery as above. 3. Short-segment 70% stenosis involving  the vertical petrous right ICA. 4. Multifocal severe proximal left M2 stenoses as above. 5. Moderate atherosclerotic irregularity elsewhere throughout the intracranial circulation. No other hemodynamically significant or correctable stenosis identified. MRA NECK IMPRESSION: 1. Atheromatous irregularity with up to approximately 40% stenosis about the origins of the internal carotid arteries bilaterally. Otherwise wide patency of both carotid artery systems within the neck. 2. Wide patency of both vertebral arteries within the neck. Right vertebral artery slightly dominant. 3. Short-segment 40% stenosis at the origin of the right subclavian artery. Electronically Signed   By: Rise Mu M.D.   On: 12/12/2018 20:46   Mr Angio Neck W Wo Contrast  Result Date: 12/12/2018 CLINICAL DATA:  Initial evaluation for acute stroke. EXAM: MRI HEAD WITHOUT CONTRAST MRA HEAD WITHOUT CONTRAST MRA NECK WITHOUT AND WITH CONTRAST TECHNIQUE: Multiplanar, multiecho pulse sequences of the brain and surrounding structures were obtained without intravenous contrast. Angiographic images of the Circle of Willis were obtained using MRA technique without intravenous contrast. Angiographic images of the neck were obtained using MRA technique without and with intravenous contrast. Carotid stenosis measurements (when applicable) are obtained utilizing NASCET criteria, using the distal internal carotid diameter as the denominator. CONTRAST:  8 cc of Gadavist COMPARISON:  Prior CT from earlier the same day. FINDINGS: MRI HEAD FINDINGS Brain: Diffuse prominence of the CSF containing spaces compatible with generalized age-related cerebral atrophy. Mild scattered T2/FLAIR hyperintensity within the periventricular and deep white matter both cerebral hemispheres most consistent with chronic small vessel ischemic disease, minimal for age. 4.6 cm area of confluent restricted diffusion involving the mesial left temporal occipital region  consistent with acute left PCA territory infarct (series 2, image 28). Mild extension into the posterior left hippocampal formation. Minimal patchy involvement of the lateral left thalamus. No associated hemorrhage or mass effect. No other evidence for acute or subacute ischemia. Gray-white matter differentiation otherwise maintained. No other areas of remote cortical infarction. No  foci of susceptibility artifact to suggest acute or chronic intracranial hemorrhage. No mass lesion, midline shift or mass effect. Ventricles normal in size without evidence for hydrocephalus. Cavum et septum pellucidum noted. Pituitary gland suprasellar region within normal limits. Midline structures intact. Vascular: Major intracranial vascular flow voids well maintained and normal in appearance. Skull and upper cervical spine: Craniocervical junction within normal limits. Degenerative disc bulging noted at C3-4 with resultant mild-to-moderate spinal stenosis (series 3, image 19). Bone marrow signal intensity within normal limits. No scalp soft tissue abnormality. Sinuses/Orbits: Globes and orbital soft tissues within normal limits. Paranasal sinuses are clear. Trace bilateral mastoid effusions, of doubtful significance. Other: None. MRA HEAD FINDINGS ANTERIOR CIRCULATION: Distal cervical segments of the internal carotid arteries are patent with symmetric antegrade flow. Distal cervical left ICA tortuous. Scattered atheromatous irregularity within the petrous, cavernous, and supraclinoid segments bilaterally. There is a short-segment moderate to severe stenosis involving the vertical petrous right ICA of up to 70% by NASCET criteria (series 5, image 113). No other hemodynamically significant stenosis. ICA termini well perfused. Atheromatous irregularity throughout the A1 segments without high-grade stenosis. Left A1 dominant. Normal anterior communicating artery complex. Anterior cerebral arteries widely patent to their distal aspects  without stenosis. Multifocal atheromatous irregularity throughout the M1 segments bilaterally without high-grade stenosis. Normal MCA bifurcations. On the left, multifocal severe proximal M2 stenoses involving both superior and inferior divisions noted (series 552, image 10) no significant stenosis seen about the proximal right M2 branches. Distal MCA branches well perfused and symmetric bilaterally. POSTERIOR CIRCULATION: Right vertebral artery slightly dominant and is patent to the vertebrobasilar junction without stenosis. Short-segment moderate stenosis involving the distal left V4 segment just prior to the vertebrobasilar junction (series 5, image 128). Left PICA patent proximally. Right PICA not seen. Short-segment moderate stenosis noted at the proximal basilar artery, just prior to the takeoff of the anterior inferior cerebral arteries (series 5, image 123). Basilar otherwise widely patent to its distal aspect. Superior cerebral arteries patent bilaterally. Both of the posterior cerebral arteries primarily supplied via the basilar. Scattered atheromatous irregularity within the right PCA with smooth moderate narrowing of the mid right P2 segment (series 555, image 12). Right PCA is perfused to its distal aspect. Short-segment moderate proximal left P2 stenosis (series 555, image 9). Distally, there is abrupt occlusion of a proximal left P3 segment (series 555, image 8), acute in nature given the acute left PCA territory infarct. No intracranial aneurysm. MRA NECK FINDINGS Source images reviewed. Visualized aortic arch of normal caliber with normal 3 vessel morphology. No hemodynamically significant stenosis seen about the origin of the great vessels. Visualized left subclavian artery widely patent. Short-segment stenosis of up to approximately 40% seen at the origin of the right subclavian artery (series 1201, image 45). Right subclavian otherwise widely patent. Right common carotid artery widely patent from  its origin to the bifurcation without stenosis. Atheromatous irregularity about the proximal right ICA with associated mild stenosis of up to 40% by NASCET criteria. Right ICA mildly tortuous but otherwise patent to the skull base without stenosis or occlusion. Left common carotid artery patent from its origin to the bifurcation without stenosis. Atheromatous irregularity about the origin of the left ICA with associated mild stenosis of up to approximately 40% by NASCET criteria. Left ICA tortuous but otherwise widely patent to the skull base without stenosis or occlusion. Both vertebral arteries arise from the subclavian arteries. Right vertebral artery slightly dominant. Vertebral arteries widely patent within the neck without stenosis or  occlusion. IMPRESSION: MRI HEAD IMPRESSION: 1. Moderate sized acute ischemic left PCA territory infarct involving the mesial left temporal occipital region with patchy involvement of the left thalamus as above. No associated hemorrhage or mass effect. 2. Underlying age-related cerebral atrophy with mild chronic microvascular ischemic disease. 3. Broad posterior disc protrusion at C3-4 with resultant mild-to-moderate spinal stenosis. Finding could be further assessed with dedicated MRI of the cervical spine as clinically desired. MRA HEAD IMPRESSION: 1. Acute left P3 occlusion, corresponding with the acute left PCA territory infarct. 2. Moderate multifocal stenoses involving the distal left V4 segment and proximal basilar artery as above. 3. Short-segment 70% stenosis involving the vertical petrous right ICA. 4. Multifocal severe proximal left M2 stenoses as above. 5. Moderate atherosclerotic irregularity elsewhere throughout the intracranial circulation. No other hemodynamically significant or correctable stenosis identified. MRA NECK IMPRESSION: 1. Atheromatous irregularity with up to approximately 40% stenosis about the origins of the internal carotid arteries bilaterally.  Otherwise wide patency of both carotid artery systems within the neck. 2. Wide patency of both vertebral arteries within the neck. Right vertebral artery slightly dominant. 3. Short-segment 40% stenosis at the origin of the right subclavian artery. Electronically Signed   By: Rise Mu M.D.   On: 12/12/2018 20:46   Mr Brain Wo Contrast  Result Date: 12/12/2018 CLINICAL DATA:  Initial evaluation for acute stroke. EXAM: MRI HEAD WITHOUT CONTRAST MRA HEAD WITHOUT CONTRAST MRA NECK WITHOUT AND WITH CONTRAST TECHNIQUE: Multiplanar, multiecho pulse sequences of the brain and surrounding structures were obtained without intravenous contrast. Angiographic images of the Circle of Willis were obtained using MRA technique without intravenous contrast. Angiographic images of the neck were obtained using MRA technique without and with intravenous contrast. Carotid stenosis measurements (when applicable) are obtained utilizing NASCET criteria, using the distal internal carotid diameter as the denominator. CONTRAST:  8 cc of Gadavist COMPARISON:  Prior CT from earlier the same day. FINDINGS: MRI HEAD FINDINGS Brain: Diffuse prominence of the CSF containing spaces compatible with generalized age-related cerebral atrophy. Mild scattered T2/FLAIR hyperintensity within the periventricular and deep white matter both cerebral hemispheres most consistent with chronic small vessel ischemic disease, minimal for age. 4.6 cm area of confluent restricted diffusion involving the mesial left temporal occipital region consistent with acute left PCA territory infarct (series 2, image 28). Mild extension into the posterior left hippocampal formation. Minimal patchy involvement of the lateral left thalamus. No associated hemorrhage or mass effect. No other evidence for acute or subacute ischemia. Gray-white matter differentiation otherwise maintained. No other areas of remote cortical infarction. No foci of susceptibility artifact to  suggest acute or chronic intracranial hemorrhage. No mass lesion, midline shift or mass effect. Ventricles normal in size without evidence for hydrocephalus. Cavum et septum pellucidum noted. Pituitary gland suprasellar region within normal limits. Midline structures intact. Vascular: Major intracranial vascular flow voids well maintained and normal in appearance. Skull and upper cervical spine: Craniocervical junction within normal limits. Degenerative disc bulging noted at C3-4 with resultant mild-to-moderate spinal stenosis (series 3, image 19). Bone marrow signal intensity within normal limits. No scalp soft tissue abnormality. Sinuses/Orbits: Globes and orbital soft tissues within normal limits. Paranasal sinuses are clear. Trace bilateral mastoid effusions, of doubtful significance. Other: None. MRA HEAD FINDINGS ANTERIOR CIRCULATION: Distal cervical segments of the internal carotid arteries are patent with symmetric antegrade flow. Distal cervical left ICA tortuous. Scattered atheromatous irregularity within the petrous, cavernous, and supraclinoid segments bilaterally. There is a short-segment moderate to severe stenosis involving the vertical  petrous right ICA of up to 70% by NASCET criteria (series 5, image 113). No other hemodynamically significant stenosis. ICA termini well perfused. Atheromatous irregularity throughout the A1 segments without high-grade stenosis. Left A1 dominant. Normal anterior communicating artery complex. Anterior cerebral arteries widely patent to their distal aspects without stenosis. Multifocal atheromatous irregularity throughout the M1 segments bilaterally without high-grade stenosis. Normal MCA bifurcations. On the left, multifocal severe proximal M2 stenoses involving both superior and inferior divisions noted (series 552, image 10) no significant stenosis seen about the proximal right M2 branches. Distal MCA branches well perfused and symmetric bilaterally. POSTERIOR  CIRCULATION: Right vertebral artery slightly dominant and is patent to the vertebrobasilar junction without stenosis. Short-segment moderate stenosis involving the distal left V4 segment just prior to the vertebrobasilar junction (series 5, image 128). Left PICA patent proximally. Right PICA not seen. Short-segment moderate stenosis noted at the proximal basilar artery, just prior to the takeoff of the anterior inferior cerebral arteries (series 5, image 123). Basilar otherwise widely patent to its distal aspect. Superior cerebral arteries patent bilaterally. Both of the posterior cerebral arteries primarily supplied via the basilar. Scattered atheromatous irregularity within the right PCA with smooth moderate narrowing of the mid right P2 segment (series 555, image 12). Right PCA is perfused to its distal aspect. Short-segment moderate proximal left P2 stenosis (series 555, image 9). Distally, there is abrupt occlusion of a proximal left P3 segment (series 555, image 8), acute in nature given the acute left PCA territory infarct. No intracranial aneurysm. MRA NECK FINDINGS Source images reviewed. Visualized aortic arch of normal caliber with normal 3 vessel morphology. No hemodynamically significant stenosis seen about the origin of the great vessels. Visualized left subclavian artery widely patent. Short-segment stenosis of up to approximately 40% seen at the origin of the right subclavian artery (series 1201, image 45). Right subclavian otherwise widely patent. Right common carotid artery widely patent from its origin to the bifurcation without stenosis. Atheromatous irregularity about the proximal right ICA with associated mild stenosis of up to 40% by NASCET criteria. Right ICA mildly tortuous but otherwise patent to the skull base without stenosis or occlusion. Left common carotid artery patent from its origin to the bifurcation without stenosis. Atheromatous irregularity about the origin of the left ICA with  associated mild stenosis of up to approximately 40% by NASCET criteria. Left ICA tortuous but otherwise widely patent to the skull base without stenosis or occlusion. Both vertebral arteries arise from the subclavian arteries. Right vertebral artery slightly dominant. Vertebral arteries widely patent within the neck without stenosis or occlusion. IMPRESSION: MRI HEAD IMPRESSION: 1. Moderate sized acute ischemic left PCA territory infarct involving the mesial left temporal occipital region with patchy involvement of the left thalamus as above. No associated hemorrhage or mass effect. 2. Underlying age-related cerebral atrophy with mild chronic microvascular ischemic disease. 3. Broad posterior disc protrusion at C3-4 with resultant mild-to-moderate spinal stenosis. Finding could be further assessed with dedicated MRI of the cervical spine as clinically desired. MRA HEAD IMPRESSION: 1. Acute left P3 occlusion, corresponding with the acute left PCA territory infarct. 2. Moderate multifocal stenoses involving the distal left V4 segment and proximal basilar artery as above. 3. Short-segment 70% stenosis involving the vertical petrous right ICA. 4. Multifocal severe proximal left M2 stenoses as above. 5. Moderate atherosclerotic irregularity elsewhere throughout the intracranial circulation. No other hemodynamically significant or correctable stenosis identified. MRA NECK IMPRESSION: 1. Atheromatous irregularity with up to approximately 40% stenosis about the origins of the  internal carotid arteries bilaterally. Otherwise wide patency of both carotid artery systems within the neck. 2. Wide patency of both vertebral arteries within the neck. Right vertebral artery slightly dominant. 3. Short-segment 40% stenosis at the origin of the right subclavian artery. Electronically Signed   By: Rise MuBenjamin  McClintock M.D.   On: 12/12/2018 20:46   [facility] [place] [facility]  Result Date: 12/13/2018 Carotid Arterial Duplex  Study Indications:  CVA. Risk Factors: Hypertension, Diabetes, coronary artery disease. Performing Technologist: Gertie FeyMichelle Simonetti MHA, RDMS, RVT, RDCS  Examination Guidelines: A complete evaluation includes B-mode imaging, spectral Doppler, color Doppler, and power Doppler as needed of all accessible portions of each vessel. Bilateral testing is considered an integral part of a complete examination. Limited examinations for reoccurring indications may be performed as noted.  Right Carotid Findings: +----------+--------+--------+--------+---------------------+------------------+           PSV cm/sEDV cm/sStenosisPlaque Description   Comments           +----------+--------+--------+--------+---------------------+------------------+ CCA Prox  97      9                                    intimal thickening +----------+--------+--------+--------+---------------------+------------------+ CCA Distal60      11                                   intimal thickening +----------+--------+--------+--------+---------------------+------------------+ ICA Prox  57      10              heterogenous,                                                             irregular and                                                             calcific                                +----------+--------+--------+--------+---------------------+------------------+ ICA Distal68      19                                                      +----------+--------+--------+--------+---------------------+------------------+ ECA       89      8               smooth and                                                                heterogenous                            +----------+--------+--------+--------+---------------------+------------------+ +----------+--------+-------+----------------+-------------------+  PSV cm/sEDV cmsDescribe        Arm Pressure (mmHG)  +----------+--------+-------+----------------+-------------------+ [id number]            Multiphasic, WNL                    +----------+--------+-------+----------------+-------------------+ +---------+--------+--+--------+--+---------+ VertebralPSV cm/s47EDV cm/s11Antegrade +---------+--------+--+--------+--+---------+  Left Carotid Findings: +----------+-------+-------+--------+---------------------------------+--------+           PSV    EDV    StenosisPlaque Description               Comments           cm/s   cm/s                                                     +----------+-------+-------+--------+---------------------------------+--------+ CCA Prox  91     12             smooth and heterogenous                   +----------+-------+-------+--------+---------------------------------+--------+ CCA Distal81     13                                                       +----------+-------+-------+--------+---------------------------------+--------+ ICA Prox  100    30             heterogenous, irregular and                                               calcific                                  +----------+-------+-------+--------+---------------------------------+--------+ ICA Distal73     20                                                       +----------+-------+-------+--------+---------------------------------+--------+ ECA       114    16             smooth and heterogenous                   +----------+-------+-------+--------+---------------------------------+--------+ +----------+--------+--------+----------------+-------------------+           PSV cm/sEDV cm/sDescribe        Arm Pressure (mmHG) +----------+--------+--------+----------------+-------------------+ [id number]             Multiphasic, WNL                    +----------+--------+--------+----------------+-------------------+  +---------+--------+--+--------+--+---------+ VertebralPSV cm/s88EDV cm/s16Antegrade +---------+--------+--+--------+--+---------+  Summary: Right Carotid: Velocities in the right ICA are consistent with a 1-39% stenosis. Left Carotid: Velocities in the left ICA are consistent with a 1-39% stenosis. Vertebrals:  Bilateral vertebral arteries demonstrate antegrade flow. Subclavians: Normal flow hemodynamics were seen in bilateral subclavian  arteries. *See table(s) above for measurements and observations.     Preliminary     PHYSICAL EXAM Pleasant middle aged male not in distress. . Afebrile. Head is nontraumatic. Neck is supple without bruit.    Cardiac exam no murmur or gallop. Lungs are clear to auscultation. Distal pulses are well felt. Neurological Exam ;  Awake  Alert oriented x 3. Normal speech and language diminished recall 0/3..eye movements full without nystagmus.fundi were not visualized. Vision acuity   appear normal.  Right superior quadrantanopsia.  Hearing is normal. Palatal movements are normal. Face symmetric. Tongue midline. Normal strength, tone, reflexes and coordination.  Except right foot drop with ankle dorsiflexor 2/5 and everters 4/5 weakness.  Normal sensation. Gait deferred.  ASSESSMENT/PLAN Mr. Vernon Ruiz is a 68 y.o. male with history of aortic stenosis status post post aortic valve repair with a pig valve, coronary artery disease, diabetes, gastroesophageal reflux disease, hypertension and obstructive sleep apnea and chronic atrial fibrillation status post clipping of left atrial appendage in June 2018 on Eliquis and chronic right foot drop presenting with HA and confusion.   Stroke:   L PCA infarct embolic secondary to known AF on Eliquis   CT head hypodensity L temporal occipital lobe in L PCA territory. Mild small vessel disease and atrophy.   MRI  L PCA territory infarct L temporal occipital region w/ patchy involvement L thalamus. Small vessel  disease. Atrophy. Disc protrusion C3-4 w/ mild/mod stenosis   MRA head  L P3 occlusion c/w L PCA infarct. Mod multifocal distal L V4 and BA stenoses. Multifocal severe L M2 stenoses. Mod atherosclerosis  MRA neck  40% B ICA stenoses. R subclavian 40% stenoses.  Carotid doppler B ICA 1-39% stenosis, VAs antegrade   2D Echo EF 60-65%. LA and RA dilated. AV bioprosthesis. No source of embolus   LDL 124  HgbA1c 8.5  Eliquis for VTE prophylaxis  aspirin 81 mg daily and Eliquis (apixaban) daily prior to admission, now on Eliquis (apixaban) daily. Considering change to pradaxa. Care management assessing cost.  Added SLP for language/cognition  Therapy recommendations:  No OT  Disposition:  pending   Atrial Fibrillation s/p LA appendage clipping  Home anticoagulation:  Eliquis (apixaban) daily continued in the hospital  Last INR 1.32  TSH normal . Continue Eliquis (apixaban) daily at discharge   Hypertension  Stable . Permissive hypertension (OK if < 220/120) but gradually normalize in 5-7 days . Long-term BP goal normotensive  Hyperlipidemia  Home meds:  No statin  LDL 124, goal < 70  Added Lipitor 40  Continue statin at discharge  Diabetes type II Uncontrolled  HgbA1c 8.5, goal < 7.0  Other Stroke Risk Factors  Advanced age  Cigarette smoker, quit 2 yrs ago  ETOH use, alcohol level No results found, advised to drink no more than 2 drink(s) a day  Obesity, Body mass index is 30.95 kg/m., recommend weight loss, diet and exercise as appropriate   Coronary artery disease  Obstructive sleep apnea  Aortic stenosis s/p AVR  Other Active Problems  R foot drop followed by Dr. Carloyn Jaeger day # 1  I have personally obtained history,examined this patient, reviewed notes, independently viewed imaging studies, participated in medical decision making and plan of care.ROS completed by me personally and pertinent positives fully documented  I have made any  additions or clarifications directly to the above note.   The patient presented with embolic left PCA branch infarct secondary to atrial fibrillation despite being  on anticoagulation with Eliquis.  I have looked into his insurance if he were to switch to Pradaxa it would cost him a lot more because it is a tier 4 drug versus L with Eliquis which is tier 3.  Recommend he continue Eliquis.  I do not see any added benefit of aspirin unless he has a cardiac stent or cardiac needs which he does not.  Recommend add statin for elevated lipids and aggressive control of diabetes.  Patient also counseled to be compliant with his CPAP for his sleep apnea.  Patient advised not to drive till his visual loss improves.  Follow-up as an outpatient in the stroke clinic in 6 weeks.  Greater than 50% time during this 25-minute visit was spent in counseling and coordination of care about his embolic stroke and discussion about treatment plan and answering questions.  Discussed with patient and Dr.Ghimire and Dr. Molli Knock. Delia Heady, MD Medical Director [facility] Pager: [phone] 12/13/2018 3:28 PM   To contact Stroke Continuity provider, please refer to [email or web address]. After hours, contact General Neurology

## 2018-12-13 NOTE — Progress Notes (Signed)
Patient is discharging home. Daughter is here for transport. All discharge paperwork went over with patient and daughter. All questions and concerns addressed. IV taken out, tele removed. Patient to be taken down in wheelchair.  Vernon Ruiz

## 2018-12-13 NOTE — Evaluation (Signed)
Occupational Therapy Evaluation Patient Details Name: Vernon Ruiz MRN: [id number] DOB: 17-May-1950 Today's Date: 12/13/2018    History of Present Illness Pt is a 68 yo male s/p acute L PCA CVA. Pt with R drop foot and received AFO x2 months ago. EMG Imaging revealed denervation adn demyelinating lumbosacral radiculoparthy and neurology suggesting questionable cervival myelopathy vs ALS. PMHX: HTN, CAD, afib, DMT2, OSA.   Clinical Impression   Pt PTA: living with spouse and Independent, still working. Pt currently donning his own AFO, bending over, picking item from floor and washing hands at sink. Pt performing tasks with no physical assist at this time; Pt modified independent for ADL. Pt even managed IV pole for mobility in hallway with supervisionA. Pt performing balance challenges and managing 4 steps x2 times with use of rails with supervisionA. Pt's gait speed slows with looking in different directions. Pt does not require continued OT skilled services. OT signing off.      Follow Up Recommendations  No OT follow up    Equipment Recommendations  None recommended by OT    Recommendations for Other Services       Precautions / Restrictions Restrictions Weight Bearing Restrictions: No      Mobility Bed Mobility Overal bed mobility: Modified Independent                Transfers Overall transfer level: Needs assistance Equipment used: None Transfers: Sit to/from Stand Sit to Stand: Supervision         General transfer comment: for safety with IV pole    Balance Overall balance assessment: Needs assistance Sitting-balance support: Feet supported Sitting balance-Leahy Scale: Good     Standing balance support: During functional activity Standing balance-Leahy Scale: Fair                             ADL either performed or assessed with clinical judgement   ADL Overall ADL's : At baseline                                        General ADL Comments: Pt donning his own AFO, bending over, picking item from floor and washing hands at sink. Pt performing tasks with no physical assist at this time. Pt even managed IV pole.     Vision Baseline Vision/History: Wears glasses Wears Glasses: Reading only Patient Visual Report: No change from baseline Vision Assessment?: No apparent visual deficits     Perception     Praxis      Pertinent Vitals/Pain Pain Assessment: No/denies pain     Hand Dominance Right   Extremity/Trunk Assessment Upper Extremity Assessment Upper Extremity Assessment: Overall WFL for tasks assessed;RUE deficits/detail;LUE deficits/detail RUE Sensation: WNL LUE Sensation: WNL   Lower Extremity Assessment Lower Extremity Assessment: Generalized weakness;Overall [facility] for tasks assessed   Cervical / Trunk Assessment Cervical / Trunk Assessment: Normal   Communication Communication Communication: No difficulties   Cognition Arousal/Alertness: Awake/alert Behavior During Therapy: WFL for tasks assessed/performed Overall Cognitive Status: Within Functional Limits for tasks assessed                                     General Comments       Exercises     Shoulder Instructions  Home Living Family/patient expects to be discharged to:: Private residence Living Arrangements: Spouse/significant other Available Help at Discharge: Family;Available 24 hours/day Type of Home: House Home Access: Stairs to enter [employer] of Steps: +6 +5 front; garage +7 steps   Home Layout: Two level Alternate Level Stairs-Number of Steps: full flight   Bathroom Shower/Tub: Walk-in shower;Tub/shower unit   Bathroom Toilet: Standard     Home Equipment: Cane - single point;Walker - 2 wheels          Prior Functioning/Environment Level of Independence: Independent with assistive device(s)                 OT Problem List: Decreased activity tolerance       OT Treatment/Interventions:      OT Goals(Current goals can be found in the care plan section) Acute Rehab OT Goals Patient Stated Goal: to go home OT Goal Formulation: With patient Time For Goal Achievement: 12/27/18 Potential to Achieve Goals: Good  OT Frequency:     Barriers to D/C:            Co-evaluation              AM-PAC OT "6 Clicks" Daily Activity     Outcome Measure Help from another person eating meals?: None Help from another person taking care of personal grooming?: None Help from another person toileting, which includes using toliet, bedpan, or urinal?: None Help from another person bathing (including washing, rinsing, drying)?: None Help from another person to put on and taking off regular upper body clothing?: None Help from another person to put on and taking off regular lower body clothing?: None 6 Click Score: 24   End of Session Equipment Utilized During Treatment: Gait belt Nurse Communication: Mobility status  Activity Tolerance: Patient tolerated treatment well Patient left: in chair;with call bell/phone within reach  OT Visit Diagnosis: Unsteadiness on feet (R26.81);Muscle weakness (generalized) (M62.81)                Time: 2585-2778 OT Time Calculation (min): 27 min Charges:  OT General Charges $OT Visit: 1 Visit OT Evaluation $OT Eval Moderate Complexity: 1 Mod OT Treatments $Self Care/Home Management : 8-22 mins  Ebony Hail Harold Hedge) Marsa Aris OTR/L Acute Rehabilitation Services Pager: [phone] Office: [place] 12/13/2018, 10:26 AM

## 2018-12-13 NOTE — TOC Transition Note (Signed)
Transition of Care [facility]) - CM/SW Discharge Note   Patient Details  Name: Vernon Ruiz MRN: [id number] Date of Birth: 1950-08-05  Transition of Care [facility]) CM/SW Contact:  Pollie Friar, RN Phone Number: 12/13/2018, 4:10 PM   Clinical Narrative:    Pt discharging home with self care. No f/u per PT/OT and no DME needs.  Pt has PCP , insurance and transportation home.    Final next level of care: Home/Self Care Barriers to Discharge: No Barriers Identified   Patient Goals and CMS Choice        Discharge Placement                       Discharge Plan and Services                                     Social Determinants of Health (SDOH) Interventions     Readmission Risk Interventions No flowsheet data found.

## 2018-12-13 NOTE — Progress Notes (Signed)
SLP Cancellation Note  Patient Details Name: Vernon Ruiz MRN: [id number] DOB: 07/25/1950   Cancelled treatment:       Reason Eval/Treat Not Completed: Other (comment) Pt has passed the [facility] swallow screen and is on regular solids, thin liquids. RN says he is not having overt difficulty with this diet. Will defer swallow evaluation at this time, but recommend MD consider SLP speech/language evaluation given acute infarct.    Venita Sheffield Ona Rathert 12/13/2018, 11:25 AM  Pollyann Glen, M.A. [place] Acute Environmental education officer [phone] Office [phone]

## 2018-12-13 NOTE — Progress Notes (Signed)
PT Cancellation Note/Discharge  Patient Details Name: Vernon Ruiz MRN: [id number] DOB: [birth year]   Cancelled Treatment:    Reason Eval/Treat Not Completed: PT screened, no needs identified, will sign off.  OT screened for PT needs.  No needs identified.  Pt seems at his baseline per OT report.  His balance, gait, and stairs were assessed by OT (see OT notes for details).  PT to sign off.   Thanks,  Barbarann Ehlers. Traye Bates, PT, DPT  Acute Rehabilitation [phone] pager [phone] office  @ [facility]: [phone]     Harvie Heck 12/13/2018, 3:30 PM

## 2018-12-13 NOTE — Progress Notes (Signed)
  Echocardiogram 2D Echocardiogram has been performed.  Vernon Ruiz 12/13/2018, 8:50 AM

## 2018-12-13 NOTE — Progress Notes (Signed)
Carotid artery duplex completed. Refer to "CV Proc" under chart review to view preliminary results.  12/13/2018 10:31 AM Maudry Mayhew, MHA, RVT, RDCS, RDMS

## 2018-12-14 ENCOUNTER — Ambulatory Visit (HOSPITAL_COMMUNITY): Payer: Medicare Other

## 2018-12-14 ENCOUNTER — Telehealth: Payer: Self-pay

## 2018-12-14 LAB — B. BURGDORFI ANTIBODIES: Lyme IgG/IgM Ab: <0.91 {ISR} (ref 0.00–0.90)

## 2018-12-14 LAB — COMPREHENSIVE METABOLIC PANEL
ALT: 24 IU/L (ref 0–44)
AST: 22 IU/L (ref 0–40)
Albumin/Globulin Ratio: 2.1 (ref 1.2–2.2)
Albumin: 4.5 g/dL (ref 3.8–4.8)
Alkaline Phosphatase: 49 IU/L (ref 39–117)
BUN/Creatinine Ratio: 20 (ref 10–24)
BUN: 21 mg/dL (ref 8–27)
Bilirubin Total: 0.3 mg/dL (ref 0.0–1.2)
CO2: 22 mmol/L (ref 20–29)
Calcium: 9.6 mg/dL (ref 8.6–10.2)
Chloride: 101 mmol/L (ref 96–106)
Creatinine, Ser: 1.03 mg/dL (ref 0.76–1.27)
GFR calc Af Amer: 86 mL/min/{1.73_m2} (ref >59)
GFR calc non Af Amer: 75 mL/min/{1.73_m2} (ref >59)
Globulin, Total: 2.1 g/dL (ref 1.5–4.5)
Glucose: 122 mg/dL — ABNORMAL HIGH (ref 65–99)
Potassium: 4.7 mmol/L (ref 3.5–5.2)
Sodium: 139 mmol/L (ref 134–144)
Total Protein: 6.6 g/dL (ref 6.0–8.5)

## 2018-12-14 LAB — MULTIPLE MYELOMA PANEL, SERUM
Albumin SerPl Elph-Mcnc: 3.7 g/dL (ref 2.9–4.4)
Albumin/Glob SerPl: 1.3 (ref 0.7–1.7)
Alpha 1: 0.3 g/dL (ref 0.0–0.4)
Alpha2 Glob SerPl Elph-Mcnc: 1 g/dL (ref 0.4–1.0)
B-Globulin SerPl Elph-Mcnc: 0.9 g/dL (ref 0.7–1.3)
Gamma Glob SerPl Elph-Mcnc: 0.7 g/dL (ref 0.4–1.8)
Globulin, Total: 2.9 g/dL (ref 2.2–3.9)
IgA/Immunoglobulin A, Serum: 239 mg/dL (ref 61–437)
IgG (Immunoglobin G), Serum: 877 mg/dL (ref 603–1613)
IgM (Immunoglobulin M), Srm: 27 mg/dL (ref 20–172)

## 2018-12-14 LAB — PAN-ANCA
ANCA Proteinase 3: <3.5 U/mL (ref 0.0–3.5)
Atypical pANCA: <1:20 {titer}
C-ANCA: <1:20 {titer}
Myeloperoxidase Ab: <9 U/mL (ref 0.0–9.0)
P-ANCA: <1:20 {titer}

## 2018-12-14 LAB — ANGIOTENSIN CONVERTING ENZYME: Angio Convert Enzyme: 40 U/L (ref 14–82)

## 2018-12-14 LAB — SEDIMENTATION RATE: Sed Rate: 17 mm/hr (ref 0–30)

## 2018-12-14 LAB — ANA W/REFLEX: Anti Nuclear Antibody (ANA): NEGATIVE

## 2018-12-14 LAB — RHEUMATOID FACTOR: Rhuematoid fact SerPl-aCnc: <10 IU/mL (ref 0.0–13.9)

## 2018-12-14 LAB — VITAMIN B12: Vitamin B-12: 429 pg/mL (ref 232–1245)

## 2018-12-14 LAB — CK: Total CK: 504 U/L (ref 41–331)

## 2018-12-14 NOTE — Telephone Encounter (Signed)
I contacted the pt and advised of result. He verbalized understanding and has scheduled his EMG for 01/02/2019 here at our office.

## 2018-12-14 NOTE — Telephone Encounter (Signed)
-----   Message from Kathrynn Ducking, MD sent at 12/14/2018  7:24 AM EDT ----- Blood work is unremarkable with exception of a modest elevation in muscle enzyme level, we will be doing EMG and nerve conduction study in the future.  Please call the patient. ----- Message ----- From: Lavone Neri Lab Results In Sent: 12/13/2018   7:37 AM EDT To: Kathrynn Ducking, MD

## 2018-12-19 ENCOUNTER — Telehealth: Payer: Self-pay

## 2018-12-19 NOTE — Telephone Encounter (Signed)
Patient called and stated that he was recently in the hospital for a stroke and would like to know when he would be able to drive again. Please advise.

## 2018-12-19 NOTE — Telephone Encounter (Signed)
I contacted the pt after reviewing the hospital note. Pt was evaluated on 12/12/2018 for a CVA. Hsp was advised the pt to f/u on with Stroke NP Janett Billow) Pt has been schedule for f/u on 12/26/2018 at 11:15. I advised the pt to adhere to the recommendation from Hsp on not driving till f/u is completed at our office.

## 2018-12-21 DIAGNOSIS — Z6836 Body mass index (BMI) 36.0-36.9, adult: Secondary | ICD-10-CM | POA: Diagnosis not present

## 2018-12-21 DIAGNOSIS — E119 Type 2 diabetes mellitus without complications: Secondary | ICD-10-CM | POA: Diagnosis not present

## 2018-12-21 DIAGNOSIS — Z7189 Other specified counseling: Secondary | ICD-10-CM | POA: Diagnosis not present

## 2018-12-21 DIAGNOSIS — E785 Hyperlipidemia, unspecified: Secondary | ICD-10-CM | POA: Diagnosis not present

## 2018-12-21 DIAGNOSIS — R809 Proteinuria, unspecified: Secondary | ICD-10-CM | POA: Diagnosis not present

## 2018-12-21 DIAGNOSIS — I10 Essential (primary) hypertension: Secondary | ICD-10-CM | POA: Diagnosis not present

## 2018-12-21 DIAGNOSIS — I4891 Unspecified atrial fibrillation: Secondary | ICD-10-CM | POA: Diagnosis not present

## 2018-12-22 ENCOUNTER — Other Ambulatory Visit: Payer: Self-pay | Admitting: Cardiovascular Disease

## 2018-12-22 DIAGNOSIS — R0989 Other specified symptoms and signs involving the circulatory and respiratory systems: Secondary | ICD-10-CM

## 2018-12-22 DIAGNOSIS — M79604 Pain in right leg: Secondary | ICD-10-CM

## 2018-12-26 ENCOUNTER — Encounter: Payer: Self-pay | Admitting: Adult Health

## 2018-12-26 ENCOUNTER — Telehealth: Payer: Self-pay | Admitting: Adult Health

## 2018-12-26 ENCOUNTER — Ambulatory Visit (INDEPENDENT_AMBULATORY_CARE_PROVIDER_SITE_OTHER): Payer: Medicare Other | Admitting: Adult Health

## 2018-12-26 ENCOUNTER — Other Ambulatory Visit: Payer: Self-pay

## 2018-12-26 VITALS — BP 173/79 | HR 53 | Temp 97.1°F | Ht 65.0 in | Wt 206.8 lb

## 2018-12-26 DIAGNOSIS — I63432 Cerebral infarction due to embolism of left posterior cerebral artery: Secondary | ICD-10-CM

## 2018-12-26 DIAGNOSIS — E785 Hyperlipidemia, unspecified: Secondary | ICD-10-CM | POA: Diagnosis not present

## 2018-12-26 DIAGNOSIS — H53461 Homonymous bilateral field defects, right side: Secondary | ICD-10-CM

## 2018-12-26 DIAGNOSIS — I480 Paroxysmal atrial fibrillation: Secondary | ICD-10-CM

## 2018-12-26 DIAGNOSIS — I10 Essential (primary) hypertension: Secondary | ICD-10-CM | POA: Diagnosis not present

## 2018-12-26 DIAGNOSIS — E119 Type 2 diabetes mellitus without complications: Secondary | ICD-10-CM | POA: Diagnosis not present

## 2018-12-26 DIAGNOSIS — Z794 Long term (current) use of insulin: Secondary | ICD-10-CM

## 2018-12-26 NOTE — Patient Instructions (Signed)
Schedule follow-up with ophthalmology for evaluation of your vision loss with a peripheral field test -if they clear you for driving, you can return to driving at that time  Continue to follow with Dr. Jannifer Franklin for ongoing right-sided weakness  Continue Eliquis (apixaban) daily  and Lipitor for secondary stroke prevention  Continue to follow up with PCP regarding cholesterol, blood pressure and diabetes management   Continue to monitor blood pressure at home  Maintain strict control of hypertension with blood pressure goal below 130/90, diabetes with hemoglobin A1c goal below 6.5% and cholesterol with LDL cholesterol (bad cholesterol) goal below 70 mg/dL. I also advised the patient to eat a healthy diet with plenty of whole grains, cereals, fruits and vegetables, exercise regularly and maintain ideal body weight.         Thank you for coming to see [place] at [facility]. I hope we have been able to provide you high quality care today.  You may receive a patient satisfaction survey over the next few weeks. We would appreciate your feedback and comments so that we may continue to improve ourselves and the health of our patients.

## 2018-12-26 NOTE — Telephone Encounter (Signed)
Pt has returned the call to [facility], he is asking for a call back

## 2018-12-26 NOTE — Telephone Encounter (Signed)
I called pt and LMVM for him that will ask JM/NP about this but he may reach out to pcp for there recommendations as well.

## 2018-12-26 NOTE — Progress Notes (Signed)
[facility] [address] [place]. [place] [address] [phone]       HOSPITAL FOLLOW UP NOTE  Mr. Vernon Ruiz Date of Birth:  1951-02-25 Medical Record Number:  [id number]   Reason for Referral:  hospital stroke follow up    CHIEF COMPLAINT:  Chief Complaint  Patient presents with  . Hospitalization Follow-up    Wife present. Rm 9. Patient has numbness to his right arm. He wants to discuss when he can drive again or go back to work.     HPI: Vernon Ruiz being seen today for in office hospital follow-up regarding left PCA infarct embolic pattern secondary to known AF on Eliquis on 12/12/2018.  History obtained from patient, wife and chart review. Reviewed all radiology images and labs personally.  Mr. Vernon Ruiz is a 68 y.o. male with history of aortic stenosis status post post aortic valve repair with a pig valve, coronary artery disease, diabetes, gastroesophageal reflux disease, hypertension and obstructive sleep apnea and chronic atrial fibrillation status post clipping of left atrial appendage in June 2018 on Eliquis and chronic right foot drop who presented to [facility] ED on 12/12/2018 with HA and confusion.  Neurology consulted with stroke work-up revealing left PCA infarct embolic pattern as evidenced on MRI secondary to known AF on Eliquis.  MRI showed left PCA territory infarct within left temporal occipital region with patchy involvement of left thalamus along with small vessel disease, atrophy and disc protrusion C3-4 with mild/moderate stenosis.  MRA head showed left P3 occlusion consistent with left PCA infarct, moderate multifocal distal left V4 MCA stenosis, multifocal severe left M2 stenosis and moderate arthrosclerosis.  MRA neck showed 40% bilateral ICA stenosis and right subclavian 40% stenosis.  Carotid Doppler bilateral ICA 1 to 39% stenosis mid BA is antegrade.  2D echo normal EF with LA and RA dilated, AV bioprosthesis and no cardiac source of this  identified.  LDL 124.  A1c 8.5.  Previously on aspirin 81 mg daily and Eliquis with consideration of changing AC to Pradaxa but due to cost, recommended continuation of Eliquis at discharge HTN stable.  Initiated atorvastatin 40 mg daily for HLD management.  Uncontrolled DM with elevated A1c.  Other stroke risk factors include advanced age, former tobacco use, EtOH use, obesity, CAD, OSA and aortic stenosis status post AVR.  He was discharged home with residual cognitive deficit and right superior quadrantanopia with recommendation of outpatient therapy at discharge.  Mr. Vernon Ruiz is being seen today for hospital follow-up accompanied by his wife.  Residual deficits of right superior quadrantanopia but does endorse improvement.  He is questioning return to driving and return to working.  He has not been evaluated by ophthalmology since discharge.  He denies visual loss interfering with daily activity and has returned back to all prior activities except for working and driving.  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  Wife does endorse occasional short-term memory loss with asking the same question twice but otherwise no concerns regarding cognition or memory.  He has been experiencing headaches since discharge which have been slowly improving and benefit with use of Tylenol.  His greatest concern today is return to driving and ongoing right-sided foot drop for which he follows with Dr. Anne Hahn in this office.  He plans on undergoing EMG/NCV on 01/02/2019 and has follow-up with cardiology tomorrow for lower extremity arterial Dopplers.  He continues on Eliquis without bleeding or bruising.  Continues on lovastatin without myalgias.  Blood pressure elevated today's visit but does endorse increased anxiety and stress with today's visit in regards to return to driving.  Denies new or worsening stroke/TIA  symptoms.      ROS:   14 system review of systems performed and negative with exception of foot drop and vision loss  PMH:  Past Medical History:  Diagnosis Date  . Aortic stenosis    s/p AVR using a 52mm Edwards Magna-Ease pericardial valve 10/04/16  . Coronary artery disease   . Diabetes mellitus without complication (HCC)   . GERD (gastroesophageal reflux disease)   . Heart murmur   . Hypertension   . Obstructive sleep apnea    positive from a home test, still needs to get another study done  . Persistent atrial fibrillation    s/p clipping of LA appendage 10/04/16  . Right carotid bruit 12/12/2018  . Right foot drop 11/07/2018    PSH:  Past Surgical History:  Procedure Laterality Date  . AORTIC VALVE REPLACEMENT N/A 10/04/2016   Procedure: AORTIC VALVE REPLACEMENT (AVR);  Surgeon: [name], MD;  Location: [facility] OR;  Service: Open Heart Surgery;  Laterality: N/A;  . APPENDECTOMY    . CARDIAC CATHETERIZATION N/A 03/10/2016   Procedure: Right/Left Heart Cath and Coronary Angiography;  Surgeon: [name], MD;  Location: [facility] INVASIVE CV LAB;  Service: Cardiovascular;  Laterality: N/A;  . CLIPPING OF ATRIAL APPENDAGE N/A 10/04/2016   Procedure: CLIPPING OF ATRIAL APPENDAGE;  Surgeon: [name], MD;  Location: [facility] OR;  Service: Open Heart Surgery;  Laterality: N/A;  . COLONOSCOPY    . EYE SURGERY Right    growth removed from eye  . HERNIA REPAIR Bilateral    inguinal  . TEE WITHOUT CARDIOVERSION N/A 10/04/2016   Procedure: TRANSESOPHAGEAL ECHOCARDIOGRAM (TEE);  Surgeon: [name], MD;  Location: [facility] OR;  Service: Open Heart Surgery;  Laterality: N/A;    Social History:  Social History   Socioeconomic History  . Marital status: Married    Spouse name: Not on file  . Number of children: Not on file  . Years of education: Not on file  . Highest education level: Not on file  Occupational History  . Not on file  Social Needs  . Financial resource strain:  Not on file  . Food insecurity    Worry: Not on file    Inability: Not on file  . Transportation needs    Medical: Not on file    Non-medical: Not on file  Tobacco Use  . Smoking status: Former Smoker    Types: Cigars    Quit date: 09/16/2016    Years since quitting: 2.2  . Smokeless tobacco: Never Used  Substance and Sexual Activity  . Alcohol use: Yes    Comment: very rare  . Drug use: No  . Sexual activity: Not on file  Lifestyle  . Physical activity    Days per week: Not on file    Minutes per session: Not on file  . Stress: Not on file  Relationships  . Social Musician on phone: Not on file    Gets together: Not on file    Attends religious service: Not on file    Active member of club or organization: Not on file    Attends meetings of clubs or organizations: Not on file    Relationship status: Not on  file  . Intimate partner violence    Fear of current or ex partner: Not on file    Emotionally abused: Not on file    Physically abused: Not on file    Forced sexual activity: Not on file  Other Topics Concern  . Not on file  Social History Narrative   Retired   Owns hotels    Family History: History reviewed. No pertinent family history.  Medications:   Current Outpatient Medications on File Prior to Visit  Medication Sig Dispense Refill  . acetaminophen (TYLENOL) 325 MG tablet Take 2 tablets (650 mg total) by mouth every 6 (six) hours as needed for mild pain.    Marland Kitchen apixaban (ELIQUIS) 5 MG TABS tablet Take 5 mg by mouth 2 (two) times daily.    Marland Kitchen atorvastatin (LIPITOR) 20 MG tablet Take 1 tablet (20 mg total) by mouth daily at 6 PM. 30 tablet 0  . Cyanocobalamin (VITAMIN B 12 PO) Take 100 mg by mouth every other day.     . Dulaglutide (TRULICITY) 1.5 HQ/4.6NG SOPN Inject 1.5 mg into the skin once a week. Sunday    . empagliflozin (JARDIANCE) 25 MG TABS tablet Take 12.5 mg by mouth daily.    Marland Kitchen glimepiride (AMARYL) 4 MG tablet Take 4 mg by mouth 2 (two)  times daily.     Marland Kitchen ibuprofen (ADVIL) 200 MG tablet Take 400 mg by mouth every 6 (six) hours as needed for headache.    . Insulin Glargine (TOUJEO SOLOSTAR [place]) Inject 40 Units into the skin every evening.     Marland Kitchen losartan (COZAAR) 100 MG tablet Take 1 tablet (100 mg total) by mouth daily. 90 tablet 3  . metFORMIN (GLUCOPHAGE) 1000 MG tablet Take 1,000 mg by mouth 2 (two) times daily.   1  . metoprolol tartrate (LOPRESSOR) 50 MG tablet Take 50 mg by mouth 2 (two) times daily.     No current facility-administered medications on file prior to visit.     Allergies:   Allergies  Allergen Reactions  . Penicillins Itching and Rash    All over body  Has patient had a PCN reaction causing immediate rash, facial/tongue/throat swelling, SOB or lightheadedness with hypotension: yes Has patient had a PCN reaction causing severe rash involving mucus membranes or skin necrosis: no Has patient had a PCN reaction that required hospitalization no Has patient had a PCN reaction occurring within the last 10 years: no If all of the above answers are "NO", then may proceed with Cephalosporin use.  . Sulfa Antibiotics Rash    Rash  . Sulfamethoxazole Rash     Physical Exam  Vitals:   12/26/18 1059  BP: (!) 173/79  Pulse: (!) 53  Temp: (!) 97.1 F (36.2 C)  TempSrc: Oral  Weight: 206 lb 12.8 oz (93.8 kg)  Height: 5\' 5"  (1.651 m)   Body mass index is 34.41 kg/m. No exam data present  Depression screen [facility] 2/9 12/15/2016  Decreased Interest 0  Down, Depressed, Hopeless 0  PHQ - 2 Score 0     General: well developed, well nourished,  elderly male, seated, in no evident distress Head: head normocephalic and atraumatic.   Neck: supple with no carotid or supraclavicular bruits Cardiovascular: regular rate and rhythm, no murmurs Musculoskeletal: no deformity Skin:  no rash/petichiae Vascular:  Normal pulses all extremities   Neurologic Exam Mental Status: Awake and fully alert. Oriented to  place and time. Recent and remote memory intact. Attention span, concentration and  fund of knowledge appropriate. Mood and affect appropriate.  Cranial Nerves: Fundoscopic exam reveals sharp disc margins. Pupils equal, briskly reactive to light. Extraocular movements full without nystagmus. Visual fields mild right superior quadrantanopia . Hearing intact. Facial sensation intact. Face, tongue, palate moves normally and symmetrically.  Motor: Normal bulk and tone. Normal strength in all tested extremity muscles. Sensory.: intact to touch , pinprick , position and vibratory sensation except right foot drop with AFO brace in place.  Coordination: Rapid alternating movements normal in all extremities. Finger-to-nose and heel-to-shin performed accurately bilaterally. Gait and Station: Arises from chair without difficulty. Stance is normal. Gait demonstrates normal stride length with mild imbalance Reflexes: 1+ and symmetric. Toes downgoing.     NIHSS  1 Modified Rankin  2 CHA2DS2-VASc 6 HAS-BLED 3   Diagnostic Data (Labs, Imaging, Testing)   CT head hypodensity L temporal occipital lobe in L PCA territory. Mild small vessel disease and atrophy.   MRI  L PCA territory infarct L temporal occipital region w/ patchy involvement L thalamus. Small vessel disease. Atrophy. Disc protrusion C3-4 w/ mild/mod stenosis   MRA head  L P3 occlusion c/w L PCA infarct. Mod multifocal distal L V4 and BA stenoses. Multifocal severe L M2 stenoses. Mod atherosclerosis  MRA neck  40% B ICA stenoses. R subclavian 40% stenoses.  Carotid doppler B ICA 1-39% stenosis, VAs antegrade   2D Echo EF 60-65%. LA and RA dilated. AV bioprosthesis. No source of embolus   LDL 124  HgbA1c 8.5    ASSESSMENT: Dell Pontolie M Ehrlich is a 68 y.o. year old male presented with confusion and headache on 12/12/2018 with stroke work up revealing left PCA infarct embolic pattern secondary to known AF on Eliquis. Vascular risk factors include  AF on AC, CAD, DM, HTN, HLD, and OSA. Residual mild right superior quadrantanopia     PLAN:  1. Left PCA stroke: Continue Eliquis (apixaban) daily  and Lipitor for secondary stroke prevention. Maintain strict control of hypertension with blood pressure goal below 130/90, diabetes with hemoglobin A1c goal below 6.5% and cholesterol with LDL cholesterol (bad cholesterol) goal below 70 mg/dL.  I also advised the patient to eat a healthy diet with plenty of whole grains, cereals, fruits and vegetables, exercise regularly with at least 30 minutes of continuous activity daily and maintain ideal body weight. 2. Visual loss: Advised to schedule follow-up with ophthalmology for clearance on return to driving.  If ophthalmology clears for driving, he will be cleared to return to driving and working from a stroke standpoint 3. Atrial fibrillation: Continue Eliquis and ongoing follow up with cardiology 4. RLE symptoms: Continue to follow with Dr. Anne HahnWillis with undergoing EMG/NCV next week 5. HTN: Advised to continue current treatment regimen.  Today's BP elevated.  Advised to continue to monitor at home along with continued follow-up with PCP for management 6. HLD: Advised to continue current treatment regimen along with continued follow-up with PCP for future prescribing and monitoring of lipid panel 7. DMII: Advised to continue to monitor glucose levels at home along with continued follow-up with PCP for management and monitoring   Advised to follow-up with Dr. Anne HahnWillis ongoing but to call with any questions or concerns regarding stroke   Greater than 50% of time during this 45 minute visit was spent on counseling, explanation of diagnosis of left PCA stroke, reviewing risk factor management of atrial fibrillation, HTN, HLD and DM, planning of further management along with potential future management, and discussion with patient and family  answering all questions.    Ihor AustinJessica McCue, AGNP-BC  [facility] [address] [place], [place] [id number]  Phone [phone] Fax [phone] Note: This document was prepared with digital dictation and possible smart phrase technology. Any transcriptional errors that result from this process are unintentional.

## 2018-12-26 NOTE — Telephone Encounter (Signed)
I relayed to pt that is should get better over time.  But also mention next week 01-02-19 when in for [place]/EMG with Dr. Jannifer Franklin prior to testing so he is aware.  Pt verbalized understanding.

## 2018-12-26 NOTE — Telephone Encounter (Signed)
Please advise patient that this should improve with time but they will be able to see if there is any nerve damage done when they perform EMG/NCV next week.  Please advise patient to discuss this with Dr. Jannifer Franklin prior to testing.

## 2018-12-26 NOTE — Telephone Encounter (Signed)
Pt called stating that he ment to discuss the numbness in his arm where they put the IV in the hospital and got side tracked and forgot to mention it at the appt. Pt states it has been 2 weeks and it is still numb from his wrist to his shoulder. Please call pt to discuss.

## 2018-12-27 ENCOUNTER — Other Ambulatory Visit: Payer: Self-pay

## 2018-12-27 ENCOUNTER — Ambulatory Visit (HOSPITAL_COMMUNITY)
Admission: RE | Admit: 2018-12-27 | Discharge: 2018-12-27 | Disposition: A | Discharge status: Home / Self Care | Payer: Medicare Other | Source: Ambulatory Visit | Attending: Cardiovascular Disease | Admitting: Cardiovascular Disease

## 2018-12-27 DIAGNOSIS — R0989 Other specified symptoms and signs involving the circulatory and respiratory systems: Secondary | ICD-10-CM | POA: Diagnosis not present

## 2018-12-27 DIAGNOSIS — M79604 Pain in right leg: Secondary | ICD-10-CM | POA: Insufficient documentation

## 2018-12-27 NOTE — Progress Notes (Signed)
I agree with the above plan 

## 2018-12-28 DIAGNOSIS — I63532 Cerebral infarction due to unspecified occlusion or stenosis of left posterior cerebral artery: Secondary | ICD-10-CM | POA: Diagnosis not present

## 2018-12-28 DIAGNOSIS — H1045 Other chronic allergic conjunctivitis: Secondary | ICD-10-CM | POA: Diagnosis not present

## 2018-12-28 DIAGNOSIS — H53461 Homonymous bilateral field defects, right side: Secondary | ICD-10-CM | POA: Diagnosis not present

## 2018-12-28 DIAGNOSIS — H2513 Age-related nuclear cataract, bilateral: Secondary | ICD-10-CM | POA: Diagnosis not present

## 2018-12-28 DIAGNOSIS — H0102A Squamous blepharitis right eye, upper and lower eyelids: Secondary | ICD-10-CM | POA: Diagnosis not present

## 2018-12-28 DIAGNOSIS — H0102B Squamous blepharitis left eye, upper and lower eyelids: Secondary | ICD-10-CM | POA: Diagnosis not present

## 2018-12-28 DIAGNOSIS — E119 Type 2 diabetes mellitus without complications: Secondary | ICD-10-CM | POA: Diagnosis not present

## 2019-01-02 ENCOUNTER — Encounter: Payer: Self-pay | Admitting: Neurology

## 2019-01-02 ENCOUNTER — Ambulatory Visit (INDEPENDENT_AMBULATORY_CARE_PROVIDER_SITE_OTHER): Payer: Medicare Other | Admitting: Neurology

## 2019-01-02 ENCOUNTER — Other Ambulatory Visit: Payer: Self-pay

## 2019-01-02 DIAGNOSIS — I63432 Cerebral infarction due to embolism of left posterior cerebral artery: Secondary | ICD-10-CM

## 2019-01-02 DIAGNOSIS — R531 Weakness: Secondary | ICD-10-CM

## 2019-01-02 NOTE — Procedures (Signed)
HISTORY:  Vernon Ruiz is a 68 year old gentleman with a history of a foot drop on the right, subsequently has noted twitches in the muscles on the arms and legs bilaterally, he has slight weakness in the right arm.  He is being evaluated for possible anterior horn cell disease process.  NERVE CONDUCTION STUDIES:  Nerve conduction studies were performed on both upper extremities.  The distal motor latencies for the median nerves were prolonged bilaterally, normal for the ulnar nerves bilaterally.  The motor amplitudes for the median and ulnar nerves were normal bilaterally with normal nerve conduction velocity seen for these nerves.  The sensory latencies for the median nerves were unobtainable on the left, prolonged on the right, and normal for the ulnar nerves bilaterally.  The F-wave latencies for the ulnar nerves were normal bilaterally.  EMG STUDIES:  EMG study was performed on the right upper extremity:  The first dorsal interosseous muscle reveals 2 to 4 K units with full recruitment. No fibrillations or positive waves were noted.  1+ fasciculations were seen. The abductor pollicis brevis muscle reveals 2 to 4 K units with decreased recruitment. No fibrillations or positive waves were noted.  1+ fasciculations were seen. The extensor indicis proprius muscle reveals 1 to 3 K units with full recruitment. No fibrillations or positive waves were noted. The pronator teres muscle reveals 2 to 3 K units with decreased recruitment. No fibrillations or positive waves were noted.  1+ fasciculations were seen. The biceps muscle reveals 1 to 2 K units with full recruitment. No fibrillations or positive waves were noted.  1+ fasciculations were seen. The triceps muscle reveals 2 to 4 K units with decreased recruitment. No fibrillations or positive waves were noted.  2+ fasciculations were seen. The anterior deltoid muscle reveals 2 to 3 K units with decreased recruitment. No fibrillations or  positive waves were noted.  1+ fasciculations were seen. The cervical paraspinal muscles were tested at 2 levels. No abnormalities of insertional activity were seen at either level tested. There was fair relaxation.  EMG study was performed on the left lower extremity:  The tibialis anterior muscle reveals 2 to 4K motor units with full recruitment. No fibrillations or positive waves were seen. The peroneus tertius muscle reveals 2 to 4K motor units with decreased recruitment. No fibrillations or positive waves were seen. The medial gastrocnemius muscle reveals 1 to 5K motor units with decreased recruitment. No fibrillations or positive waves were seen. The vastus lateralis muscle reveals 2 to 4K motor units with full recruitment. No fibrillations or positive waves were seen. The iliopsoas muscle reveals 2 to 4K motor units with full recruitment. No fibrillations or positive waves were seen. The biceps femoris muscle (long head) reveals 2 to 4K motor units with full recruitment. No fibrillations or positive waves were seen. The lumbosacral paraspinal muscles were tested at 3 levels, and revealed no abnormalities of insertional activity at all 3 levels tested. There was poor relaxation.   IMPRESSION:  Nerve conduction studies done on both upper extremities shows evidence of carpal tunnel syndrome of moderate severity on the left and mild severity on the right.  EMG of the right upper extremity shows diffuse abnormalities with fasciculations in most muscles tested and evidence of chronic neuropathic denervation in several muscles that do not relate to a single nerve root involvement.  EMG of the left lower extremity shows neuropathic chronic denervation mainly in S1 innervated muscles, no significant fasciculations were seen in the lower extremity.  The possibility of an anterior horn cell disease process is still viable given these findings.  Clinical correlation is required.  Marlan Palau MD  01/02/2019 1:02 PM  [facility] [address] [place], [place] [address]  Phone [phone] Fax [phone]

## 2019-01-02 NOTE — Progress Notes (Addendum)
The patient comes in for EMG nerve conduction study.  Nerve conductions were done on both arms, evidence of bilateral carpal tunnel syndrome was seen.  EMG of the right arm shows multiple muscles with fasciculations, chronic neuropathic denervation was seen in scattered muscles.  EMG of the left leg shows chronic neuropathic denervation mainly in the S1 innervated muscles, no fasciculations were seen.  MRI of the cervical and lumbar spine have been ordered, not yet done.  If the studies are unrevealing, the consideration for ALS is heightened.      MNC    Nerve / Sites Muscle Latency Ref. Amplitude Ref. Rel Amp Segments Distance Velocity Ref. Area    ms ms mV mV %  cm m/s m/s mVms  L Median - APB     Wrist APB 5.4 ?4.4 6.3 ?4.0 100 Wrist - APB 7   24.8     Upper arm APB 9.9  6.0  94.8 Upper arm - Wrist 22 49 ?49 24.1  R Median - APB     Wrist APB 4.9 ?4.4 5.7 ?4.0 100 Wrist - APB 7   21.3     Upper arm APB 9.1  4.6  80.4 Upper arm - Wrist 22 53 ?49 17.3  L Ulnar - ADM     Wrist ADM 2.8 ?3.3 9.2 ?6.0 100 Wrist - ADM 7   32.3     B.Elbow ADM 6.8  8.2  89 B.Elbow - Wrist 21 52 ?49 31.2     A.Elbow ADM 8.9  8.0  97.2 A.Elbow - B.Elbow 10 49 ?49 31.5         A.Elbow - Wrist      R Ulnar - ADM     Wrist ADM 3.3 ?3.3 8.9 ?6.0 100 Wrist - ADM 7   30.2     B.Elbow ADM 7.1  7.7  86.9 B.Elbow - Wrist 20 53 ?49 27.1     A.Elbow ADM 9.2  7.4  96.2 A.Elbow - B.Elbow 10 49 ?49 26.9         A.Elbow - Wrist                 SNC    Nerve / Sites Rec. Site Peak Lat Ref.  Amp Ref. Segments Distance Peak Diff Ref.    ms ms V V  cm ms ms  L Median, Ulnar - Transcarpal comparison     Median Palm Wrist 4.6 ?2.2 9 ?35 Median Palm - Wrist 8       Ulnar Palm Wrist 2.2 ?2.2 15 ?12 Ulnar Palm - Wrist 8          Median Palm - Ulnar Palm  2.4 ?0.4  R Median, Ulnar - Transcarpal comparison     Median Palm Wrist 2.7 ?2.2 23 ?35 Median Palm - Wrist 8       Ulnar Palm Wrist 2.1 ?2.2 17 ?12 Ulnar Palm - Wrist 8           Median Palm - Ulnar Palm  0.5 ?0.4  L Median - Orthodromic (Dig II, Mid palm)     Dig II Wrist NR ?3.4 NR ?10 Dig II - Wrist 13    R Median - Orthodromic (Dig II, Mid palm)     Dig II Wrist 3.8 ?3.4 4 ?10 Dig II - Wrist 13    L Ulnar - Orthodromic, (Dig V, Mid palm)     Dig V Wrist 3.1 ?3.1 7 ?5 Dig V - Wrist 11  R Ulnar - Orthodromic, (Dig V, Mid palm)     Dig V Wrist 3.0 ?3.1 5 ?5 Dig V - Wrist 58                   F  Wave    Nerve F Lat Ref.   ms ms  L Ulnar - ADM 31.3 ?32.0  R Ulnar - ADM 31.7 ?32.0

## 2019-01-02 NOTE — Progress Notes (Signed)
Please refer to EMG and nerve conduction procedure note.  

## 2019-01-22 DIAGNOSIS — M21371 Foot drop, right foot: Secondary | ICD-10-CM | POA: Diagnosis not present

## 2019-01-23 ENCOUNTER — Ambulatory Visit
Admission: RE | Admit: 2019-01-23 | Discharge: 2019-01-23 | Disposition: A | Discharge status: Home / Self Care | Payer: Medicare Other | Source: Ambulatory Visit | Attending: Neurology | Admitting: Neurology

## 2019-01-23 ENCOUNTER — Other Ambulatory Visit: Payer: Self-pay

## 2019-01-23 DIAGNOSIS — M6281 Muscle weakness (generalized): Secondary | ICD-10-CM

## 2019-01-23 DIAGNOSIS — R531 Weakness: Secondary | ICD-10-CM

## 2019-01-25 ENCOUNTER — Telehealth: Payer: Self-pay | Admitting: Neurology

## 2019-01-25 DIAGNOSIS — M48061 Spinal stenosis, lumbar region without neurogenic claudication: Secondary | ICD-10-CM

## 2019-01-25 NOTE — Telephone Encounter (Signed)
I called the patient.  The patient has abnormalities on both the MRI of the cervical spine and lumbar spine.  On clinical examination, he has weakness of the right arm and right leg, he has hyperreflexia.  There is a question of a hyperintensity of the spinal cord at the C3-4 level, but this is seen only in the sagittal views, not on cross-sectional views.  The lumbar spine shows severe spinal stenosis at the L2-3 and L3-4 levels, this could explain the EMG findings in the legs.  The findings on MRI put the diagnosis of ALS in question, I would get a second opinion from Dr. Hosie Poisson at [facility].  The patient also desires to have an opinion from a neurosurgeon, I will get this set up.   MRI lumbar 01/25/19:  IMPRESSION:   MRI lumbar spine (without) demonstrating: - At L2-3, L3-4: disc bulging and facet hypertrophy with severe spinal stenosis and moderate-severe biforaminal stenosis. - At L1-2: disc bulging and facet hypertrophy with mild spinal stenosis and moderate foraminal stenosis. - At L4-5: disc bulging and facet hypertrophy with moderate biforaminal stenosis.   MRI cervical 01/25/19:  IMPRESSION:   MRI cervical spine (without) demonstrating:  - At C3-4: disc bulging and facet hypertrophy with moderate spinal stenosis and severe biforaminal stenosis; subtle spinal cord STIR hyperintensity on sagittal views (series 7 image 9) but not seen on axial views.  - At C4-5: disc bulging and facet hypertrophy with mild spinal stenosis and severe biforaminal stenosis. - At C6-7: disc bulging and facet hypertrophy with mild biforaminal stenosis.

## 2019-01-29 DIAGNOSIS — M4712 Other spondylosis with myelopathy, cervical region: Secondary | ICD-10-CM | POA: Diagnosis not present

## 2019-01-29 DIAGNOSIS — G959 Disease of spinal cord, unspecified: Secondary | ICD-10-CM | POA: Insufficient documentation

## 2019-01-29 DIAGNOSIS — M48062 Spinal stenosis, lumbar region with neurogenic claudication: Secondary | ICD-10-CM | POA: Diagnosis not present

## 2019-01-30 ENCOUNTER — Telehealth: Payer: Self-pay | Admitting: *Deleted

## 2019-01-30 NOTE — Telephone Encounter (Signed)
Patient with diagnosis of Atrial Fibrillation on Eliquis  for anticoagulation.    Procedure: cervical fusions Date of procedure: 02/15/2019  CHADS2-VASc score of  6 (HTN, AGE, DM2, stroke/tia x 2, CAD)  CrCl 98 Platelet count 260  In reviewing chart patient diagnosed with embolic CVA in left posterior cerebral artery on 12/12/2018.  Will defer to Dr. Burt Knack to determine if okay to hold x 3 days or patient needs bridging with enoxaparin.

## 2019-01-30 NOTE — Telephone Encounter (Signed)
   [facility] Pre-operative Risk Assessment    Request for surgical clearance:  1. What type of surgery is being performed? C3-4, C4-5 ANTERIOR CERVICAL FUSION   2. When is this surgery scheduled? 02/15/19  3. What type of clearance is required (medical clearance vs. Pharmacy clearance to hold med vs. Both)? BOTH  4. Are there any medications that need to be held prior to surgery and how long? ELIQUIS   5. Practice name and name of physician performing surgery? [facility]; DR. Kristeen Miss   6. What is your office phone number [phone]    7.   What is your office fax number [phone]  8.   Anesthesia type (None, local, MAC, general) ? GENERAL    Vernon Ruiz 01/30/2019, 12:21 PM  _________________________________________________________________   (provider comments below)

## 2019-01-31 NOTE — Telephone Encounter (Signed)
With CHADS-Vasc 6 and recent stroke I would favor bridging him with lovenox. Thank you

## 2019-01-31 NOTE — Telephone Encounter (Signed)
Patient will need bridging with enoxaparin for upcoming cervical fusion.   Please reach out to patient - we can schedule this at either office coumadin clinic.  Would need to see him about 7-10 days prior to procedure.  Tried calling - line busy

## 2019-02-01 NOTE — Telephone Encounter (Signed)
   Primary Cardiologist: Sherren Mocha, MD  Chart reviewed as part of pre-operative protocol coverage. Given past medical history and time since last visit, based on ACC/AHA guidelines, Vernon Ruiz would be at acceptable risk for the planned procedure without further cardiovascular testing.   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 Holding decision deferred to Dr. Burt Knack to determine if okay to hold x 3 days or patient needs bridging with enoxaparin. After review, it was determined that the patient will need bridging with enoxaparin for upcoming cervical fusion.  Plan is to schedule this at either office/coumadin clinic. Would need to see him about 7-10 days prior to procedure.   [place] team: -Please schedule an office or Coumadin clinic visit 7-10 days prior to 02/15/19 procedure.   I will route this recommendation to the requesting party via Epic fax function and remove from pre-op pool.  Please call with questions.  Kathyrn Drown, NP 02/01/2019, 9:44 AM

## 2019-02-01 NOTE — Telephone Encounter (Signed)
Pt has been scheduled for Pharm D visit with [facility] for Lovenox Bridging.

## 2019-02-05 ENCOUNTER — Other Ambulatory Visit: Payer: Self-pay

## 2019-02-05 ENCOUNTER — Ambulatory Visit (INDEPENDENT_AMBULATORY_CARE_PROVIDER_SITE_OTHER): Payer: Medicare Other | Admitting: Pharmacist

## 2019-02-05 DIAGNOSIS — I63432 Cerebral infarction due to embolism of left posterior cerebral artery: Secondary | ICD-10-CM | POA: Diagnosis not present

## 2019-02-05 DIAGNOSIS — I480 Paroxysmal atrial fibrillation: Secondary | ICD-10-CM | POA: Diagnosis not present

## 2019-02-05 MED ORDER — ENOXAPARIN SODIUM 150 MG/ML ~~LOC~~ SOLN: 150.0000 mg | SUBCUTANEOUS | 0 refills | Status: DC

## 2019-02-05 NOTE — Patient Instructions (Addendum)
02/11/19: Last dose of Eliquis 5mg  at 8AM, inject 1 syringe of Lovenox 150mg /ml into the fatty tissue of your abdomen at 8PM  02/12/19: No Eliquis, inject Lovenox 150mg /ml at 8PM  02/13/19: No Eliquis, inject Lovenox 150mg /ml at 8PM  02/14/19: No Eliquis, No Lovenox  02/15/19 (Morning of Procedure): No Eliquis, No Lovenox prior to procedure   02/15/19 (After Procedure): Resume Eliquis 5mg  in evening or as directed by your doctor. Ideally should resume Eliquis within 24 hours of your procedure.

## 2019-02-05 NOTE — Progress Notes (Signed)
Patient ID: AULTON Ruiz                 DOB: 1950/07/06                      MRN: [id number]     Vernon Ruiz is a 67 y.o. male referred by Dr. Burt Knack for bridging with enoxaparin prior to upcoming cervical fusion procedure on 02/15/19  Patient with diagnosis of Atrial Fibrillation on Eliquis for anticoagulation. His CHADS2-VASc score is 6 (HTN, age, T2DM, stroke/tia x2, CAD), CrCl is 98 mL/min and he had a recent (06/17/02) embolic CVA. This warrants bridging with enoxaparin prior to procedure.  Weight 93.8 kg = 150mg  Lovenox (1.5mg /kg daily)  Bridging Schedule:  02/11/19: Last dose of Eliquis 5mg  at 8AM, Lovenox 150mg /ml at [facility] 02/12/19: No Eliquis, Lovenox 150mg /ml at 8PM 02/13/19: No Eliquis, Lovenox 150mg /ml at 8PM 02/14/19: No Eliquis, No Lovenox 02/15/19 (Morning of Procedure): No Eliquis, No Lovenox prior to procedure  02/15/19 (After Procedure): Resume Eliquis 5mg  in evening. Want to resume Eliquis within 24 hours of procedure or once cleared by surgical team.   Judene Companion, Vernon 02/05/2019 8:30 AM  Megan E. Supple, PharmD, BCACP, [place] [address], [place], [place] [address] Phone: [phone]; Fax: [phone] 02/05/2019 10:32 AM

## 2019-02-05 NOTE — Telephone Encounter (Addendum)
This note has been routed to Dr. Ellene Route via [place] fax function

## 2019-02-05 NOTE — Telephone Encounter (Signed)
Patient has been seen on 02/05/2019 for lovenox bridging appointment

## 2019-02-06 ENCOUNTER — Telehealth: Payer: Self-pay | Admitting: Cardiovascular Disease

## 2019-02-06 NOTE — Telephone Encounter (Signed)
New message   Patient has questions about the side effects of eliquis. Please call.

## 2019-02-07 NOTE — Telephone Encounter (Signed)
Per DPR form, left detailed message for patient that Dr. Burt Knack said it is vitally important for him to be treated with a drug like eliquis, but he could be switched to another medication if he has a specific issue with eliquis. Reiterated that Dr. Burt Knack said he thinks eliquis is the safest drug for him to take.  Instructed him to call back if he would like to discuss further.

## 2019-02-07 NOTE — Telephone Encounter (Signed)
It's vitally important that he is treated with an anticoagulant drug like eliquis. He could switch to another DOAC or warfarin if there is a specific issue with eliquis, but I think it is the safest drug for him to take. Atrial fibrillation can always recur and is likely to do so.

## 2019-02-07 NOTE — Telephone Encounter (Signed)
The patient states he has been having issues with his R foot. He has seen Dr. Jannifer Franklin for evaluation and was told he has drop foot. He now has some numbness in that foot as well. The patient does not believe he has drop foot.  He thinks his symptoms are caused by Eliquis. Reiterated to the patient many times that the likelihood of Eliquis causing his foot issues is very low.  He repeatedly stated he does not want to be on Eliquis if he doesn't have to be after much discussion about PAF and how Eliquis protects him from stroke if he does go back into atrial fibrillation.  He again stated his surgeon told him he isn't in afib anymore. When asked if he would consider switching to Coumadin, he said no.  Informed the patient the pharmacist would call him to further discuss symptoms further. He was grateful for assistance.

## 2019-02-07 NOTE — Telephone Encounter (Signed)
Patient with diagnosis of Atrial Fibrillation on Eliquis for anticoagulation who had recent embolic CVA 04/19/8414. Lovenox bridging to begin 11/1 for cervical fusion procedure 02/15/2019. The patient called because he wants to stop Eliquis.  He understands Eliquis helps to protect from stroke if he is in afib, but he states he was told he does not have afib anymore. He also read the potential side effects and does not want to take it.   He would like Dr. Burt Knack to weigh in and see his feelings about stopping Eliquis altogether.

## 2019-02-07 NOTE — Telephone Encounter (Signed)
Follow Up ° °Patient is returning call. Please give patient a call back.  °

## 2019-02-08 NOTE — Telephone Encounter (Signed)
Spoke with pt and discussed benefits of remaining on anticoagulation therapy to prevent future strokes and blood clots, as well as better safety profile compared to other DOACs and warfarin. He is agreeable to continuing Eliquis therapy.

## 2019-02-09 DIAGNOSIS — Z1159 Encounter for screening for other viral diseases: Secondary | ICD-10-CM | POA: Diagnosis not present

## 2019-02-10 DIAGNOSIS — E785 Hyperlipidemia, unspecified: Secondary | ICD-10-CM | POA: Diagnosis not present

## 2019-02-10 DIAGNOSIS — I4819 Other persistent atrial fibrillation: Secondary | ICD-10-CM | POA: Diagnosis not present

## 2019-02-10 DIAGNOSIS — E1169 Type 2 diabetes mellitus with other specified complication: Secondary | ICD-10-CM | POA: Diagnosis not present

## 2019-02-10 DIAGNOSIS — I10 Essential (primary) hypertension: Secondary | ICD-10-CM | POA: Diagnosis not present

## 2019-02-15 DIAGNOSIS — M2578 Osteophyte, vertebrae: Secondary | ICD-10-CM | POA: Diagnosis not present

## 2019-02-15 DIAGNOSIS — M5001 Cervical disc disorder with myelopathy,  high cervical region: Secondary | ICD-10-CM | POA: Diagnosis not present

## 2019-02-15 DIAGNOSIS — M4712 Other spondylosis with myelopathy, cervical region: Secondary | ICD-10-CM | POA: Diagnosis not present

## 2019-02-15 DIAGNOSIS — M50021 Cervical disc disorder at C4-C5 level with myelopathy: Secondary | ICD-10-CM | POA: Diagnosis not present

## 2019-03-02 DIAGNOSIS — M4712 Other spondylosis with myelopathy, cervical region: Secondary | ICD-10-CM | POA: Diagnosis not present

## 2019-03-02 DIAGNOSIS — M5126 Other intervertebral disc displacement, lumbar region: Secondary | ICD-10-CM | POA: Diagnosis not present

## 2019-03-06 ENCOUNTER — Telehealth: Payer: Self-pay | Admitting: Cardiovascular Disease

## 2019-03-06 MED ORDER — AMLODIPINE BESYLATE 2.5 MG PO TABS: 2.5000 mg | ORAL_TABLET | Freq: Every day | ORAL | 3 refills | Status: DC

## 2019-03-06 NOTE — Telephone Encounter (Signed)
Agree with the plan to try amlodipine 2.5 mg. thanks

## 2019-03-06 NOTE — Telephone Encounter (Signed)
Spoke with the patient about his medications. Confirmed he is NOT taking diltiazem 120 mg daily. Confirmed his medication list is otherwise correct.  The patient states his BP is usually 140s/80s. In the last 3 months, he has had 2 episodes where he gets dizzy and lightheaded, his BP shoots up to ~200/100, and then he feels fine and BP normalizes in about 10-15 minutes. During these episodes, he denies CP, SOB, heart racing, palpitations.  He states his amlodipine was stopped in the hospital because his BP was low.  Spoke with Megan, pharmD and instructed the patient to restart amlodipine 5 mg daily.  The patient is reluctant as he thinks his BP will drop. He agrees to try amlodipine 2.5 mg daily. He will keep a BP report and bring to HTN clinic visit 12/15. He was grateful for call and agrees with treatment plan.

## 2019-03-06 NOTE — Telephone Encounter (Addendum)
Vernon Ruiz from [place], [place], called asking if the patient should continue to not take the medication diltiazem 120mg . The patients average blood pressure readings were 140-180/70-85. He had a spike of 194/110, which returned to normal after about 15 minutes. Caryl Pina can be reached at [phone]

## 2019-03-07 DIAGNOSIS — I10 Essential (primary) hypertension: Secondary | ICD-10-CM | POA: Diagnosis not present

## 2019-03-07 DIAGNOSIS — I4819 Other persistent atrial fibrillation: Secondary | ICD-10-CM | POA: Diagnosis not present

## 2019-03-07 DIAGNOSIS — E1169 Type 2 diabetes mellitus with other specified complication: Secondary | ICD-10-CM | POA: Diagnosis not present

## 2019-03-07 DIAGNOSIS — E785 Hyperlipidemia, unspecified: Secondary | ICD-10-CM | POA: Diagnosis not present

## 2019-03-09 DIAGNOSIS — U071 COVID-19: Secondary | ICD-10-CM | POA: Diagnosis not present

## 2019-03-09 DIAGNOSIS — R05 Cough: Secondary | ICD-10-CM | POA: Diagnosis not present

## 2019-03-13 DIAGNOSIS — Z0000 Encounter for general adult medical examination without abnormal findings: Secondary | ICD-10-CM | POA: Diagnosis not present

## 2019-03-14 ENCOUNTER — Inpatient Hospital Stay (HOSPITAL_COMMUNITY): Payer: Medicare Other

## 2019-03-14 ENCOUNTER — Other Ambulatory Visit: Payer: Self-pay

## 2019-03-14 ENCOUNTER — Inpatient Hospital Stay (HOSPITAL_COMMUNITY)
Admission: AD | Admit: 2019-03-14 | Discharge: 2019-03-18 | DRG: 178 | Disposition: A | Discharge status: Home / Self Care | Payer: Medicare Other | Source: Ambulatory Visit | Attending: Internal Medicine | Admitting: Internal Medicine

## 2019-03-14 ENCOUNTER — Encounter (HOSPITAL_COMMUNITY): Payer: Self-pay

## 2019-03-14 DIAGNOSIS — Z87891 Personal history of nicotine dependence: Secondary | ICD-10-CM | POA: Diagnosis not present

## 2019-03-14 DIAGNOSIS — Z79899 Other long term (current) drug therapy: Secondary | ICD-10-CM | POA: Diagnosis not present

## 2019-03-14 DIAGNOSIS — U071 COVID-19: Secondary | ICD-10-CM | POA: Diagnosis not present

## 2019-03-14 DIAGNOSIS — I639 Cerebral infarction, unspecified: Secondary | ICD-10-CM | POA: Diagnosis present

## 2019-03-14 DIAGNOSIS — Z794 Long term (current) use of insulin: Secondary | ICD-10-CM | POA: Diagnosis not present

## 2019-03-14 DIAGNOSIS — J984 Other disorders of lung: Secondary | ICD-10-CM | POA: Diagnosis not present

## 2019-03-14 DIAGNOSIS — Z8673 Personal history of transient ischemic attack (TIA), and cerebral infarction without residual deficits: Secondary | ICD-10-CM | POA: Diagnosis not present

## 2019-03-14 DIAGNOSIS — Z882 Allergy status to sulfonamides status: Secondary | ICD-10-CM

## 2019-03-14 DIAGNOSIS — E119 Type 2 diabetes mellitus without complications: Secondary | ICD-10-CM | POA: Diagnosis not present

## 2019-03-14 DIAGNOSIS — Z952 Presence of prosthetic heart valve: Secondary | ICD-10-CM

## 2019-03-14 DIAGNOSIS — J9811 Atelectasis: Secondary | ICD-10-CM | POA: Diagnosis not present

## 2019-03-14 DIAGNOSIS — I63432 Cerebral infarction due to embolism of left posterior cerebral artery: Secondary | ICD-10-CM

## 2019-03-14 DIAGNOSIS — E785 Hyperlipidemia, unspecified: Secondary | ICD-10-CM | POA: Diagnosis present

## 2019-03-14 DIAGNOSIS — G4733 Obstructive sleep apnea (adult) (pediatric): Secondary | ICD-10-CM | POA: Diagnosis not present

## 2019-03-14 DIAGNOSIS — R0902 Hypoxemia: Secondary | ICD-10-CM | POA: Diagnosis not present

## 2019-03-14 DIAGNOSIS — I10 Essential (primary) hypertension: Secondary | ICD-10-CM | POA: Diagnosis present

## 2019-03-14 DIAGNOSIS — I480 Paroxysmal atrial fibrillation: Secondary | ICD-10-CM | POA: Diagnosis present

## 2019-03-14 DIAGNOSIS — R0602 Shortness of breath: Secondary | ICD-10-CM | POA: Diagnosis not present

## 2019-03-14 LAB — COMPREHENSIVE METABOLIC PANEL
ALT: 28 U/L (ref 0–44)
AST: 27 U/L (ref 15–41)
Albumin: 4.4 g/dL (ref 3.5–5.0)
Alkaline Phosphatase: 62 U/L (ref 38–126)
Anion gap: 13 (ref 5–15)
BUN: 24 mg/dL — ABNORMAL HIGH (ref 8–23)
CO2: 26 mmol/L (ref 22–32)
Calcium: 9.3 mg/dL (ref 8.9–10.3)
Chloride: 99 mmol/L (ref 98–111)
Creatinine, Ser: 1.06 mg/dL (ref 0.61–1.24)
GFR calc Af Amer: >60 mL/min (ref >60)
GFR calc non Af Amer: >60 mL/min (ref >60)
Glucose, Bld: 162 mg/dL — ABNORMAL HIGH (ref 70–99)
Potassium: 4.6 mmol/L (ref 3.5–5.1)
Sodium: 138 mmol/L (ref 135–145)
Total Bilirubin: 0.7 mg/dL (ref 0.3–1.2)
Total Protein: 8.1 g/dL (ref 6.5–8.1)

## 2019-03-14 LAB — CBC WITH DIFFERENTIAL/PLATELET
Abs Immature Granulocytes: 0.02 10*3/uL (ref 0.00–0.07)
Basophils Absolute: 0 10*3/uL (ref 0.0–0.1)
Basophils Relative: 0 %
Eosinophils Absolute: 0 10*3/uL (ref 0.0–0.5)
Eosinophils Relative: 0 %
HCT: 49 % (ref 39.0–52.0)
Hemoglobin: 15.8 g/dL (ref 13.0–17.0)
Immature Granulocytes: 0 %
Lymphocytes Relative: 37 %
Lymphs Abs: 2.8 10*3/uL (ref 0.7–4.0)
MCH: 26.3 pg (ref 26.0–34.0)
MCHC: 32.2 g/dL (ref 30.0–36.0)
MCV: 81.5 fL (ref 80.0–100.0)
Monocytes Absolute: 0.6 10*3/uL (ref 0.1–1.0)
Monocytes Relative: 9 %
Neutro Abs: 4 10*3/uL (ref 1.7–7.7)
Neutrophils Relative %: 54 %
Platelets: 207 10*3/uL (ref 150–400)
RBC: 6.01 MIL/uL — ABNORMAL HIGH (ref 4.22–5.81)
RDW: 14.7 % (ref 11.5–15.5)
WBC: 7.4 10*3/uL (ref 4.0–10.5)
nRBC: 0 % (ref 0.0–0.2)

## 2019-03-14 LAB — PROCALCITONIN: Procalcitonin: <0.1 ng/mL

## 2019-03-14 LAB — ABO/RH: ABO/RH(D): AB POS

## 2019-03-14 LAB — D-DIMER, QUANTITATIVE: D-Dimer, Quant: 0.46 ug/mL-FEU (ref 0.00–0.50)

## 2019-03-14 LAB — FERRITIN: Ferritin: 671 ng/mL — ABNORMAL HIGH (ref 24–336)

## 2019-03-14 LAB — GLUCOSE, CAPILLARY
Glucose-Capillary: 132 mg/dL — ABNORMAL HIGH (ref 70–99)
Glucose-Capillary: 209 mg/dL — ABNORMAL HIGH (ref 70–99)

## 2019-03-14 LAB — BRAIN NATRIURETIC PEPTIDE: B Natriuretic Peptide: 24.8 pg/mL (ref 0.0–100.0)

## 2019-03-14 LAB — C-REACTIVE PROTEIN: CRP: 1.5 mg/dL — ABNORMAL HIGH (ref <1.0)

## 2019-03-14 LAB — HIV ANTIBODY (ROUTINE TESTING W REFLEX): HIV Screen 4th Generation wRfx: NONREACTIVE

## 2019-03-14 MED ORDER — SODIUM CHLORIDE 0.9 % IV SOLN
200.0000 mg | Freq: Once | INTRAVENOUS | Status: DC
  Filled 2019-03-14: qty 40

## 2019-03-14 MED ORDER — ACETAMINOPHEN 325 MG PO TABS: 650.0000 mg | ORAL_TABLET | Freq: Four times a day (QID) | ORAL | Status: DC | PRN

## 2019-03-14 MED ORDER — VITAMIN B 12 100 MCG PO LOZG: 100.0000 mg | LOZENGE | ORAL | Status: DC

## 2019-03-14 MED ORDER — SODIUM CHLORIDE 0.9% IV SOLUTION: Freq: Once | INTRAVENOUS | Status: DC

## 2019-03-14 MED ORDER — VITAMIN B-12 100 MCG PO TABS
100.0000 ug | ORAL_TABLET | ORAL | Status: DC
  Administered 2019-03-15 – 2019-03-17 (×2): 100 ug via ORAL
  Filled 2019-03-14 (×2): qty 1

## 2019-03-14 MED ORDER — INSULIN DETEMIR 100 UNIT/ML ~~LOC~~ SOLN
20.0000 [IU] | Freq: Every day | SUBCUTANEOUS | Status: DC
  Administered 2019-03-14 – 2019-03-15 (×2): 20 [IU] via SUBCUTANEOUS
  Filled 2019-03-14 (×2): qty 0.2

## 2019-03-14 MED ORDER — AMLODIPINE BESYLATE 5 MG PO TABS: 2.5000 mg | ORAL_TABLET | Freq: Every day | ORAL | Status: DC

## 2019-03-14 MED ORDER — METOPROLOL TARTRATE 25 MG PO TABS
25.0000 mg | ORAL_TABLET | Freq: Two times a day (BID) | ORAL | Status: DC
  Administered 2019-03-14 – 2019-03-17 (×6): 25 mg via ORAL
  Filled 2019-03-14 (×8): qty 1

## 2019-03-14 MED ORDER — ZINC SULFATE 220 (50 ZN) MG PO CAPS
220.0000 mg | ORAL_CAPSULE | Freq: Every day | ORAL | Status: DC
  Administered 2019-03-14 – 2019-03-18 (×5): 220 mg via ORAL
  Filled 2019-03-14 (×5): qty 1

## 2019-03-14 MED ORDER — ONDANSETRON HCL 4 MG/2ML IJ SOLN: 4.0000 mg | Freq: Four times a day (QID) | INTRAMUSCULAR | Status: DC | PRN

## 2019-03-14 MED ORDER — SODIUM CHLORIDE 0.9 % IV SOLN: 100.0000 mg | INTRAVENOUS | Status: DC

## 2019-03-14 MED ORDER — ATORVASTATIN CALCIUM 10 MG PO TABS
20.0000 mg | ORAL_TABLET | Freq: Every day | ORAL | Status: DC
  Administered 2019-03-14 – 2019-03-17 (×4): 20 mg via ORAL
  Filled 2019-03-14 (×4): qty 2

## 2019-03-14 MED ORDER — SENNA 8.6 MG PO TABS
1.0000 | ORAL_TABLET | Freq: Two times a day (BID) | ORAL | Status: DC
  Administered 2019-03-14 – 2019-03-18 (×8): 8.6 mg via ORAL
  Filled 2019-03-14 (×8): qty 1

## 2019-03-14 MED ORDER — SODIUM CHLORIDE 0.9 % IV SOLN
200.0000 mg | Freq: Once | INTRAVENOUS | Status: AC
  Administered 2019-03-14: 200 mg via INTRAVENOUS
  Filled 2019-03-14: qty 40

## 2019-03-14 MED ORDER — GLIMEPIRIDE 4 MG PO TABS
4.0000 mg | ORAL_TABLET | Freq: Two times a day (BID) | ORAL | Status: DC
  Administered 2019-03-14 – 2019-03-18 (×8): 4 mg via ORAL
  Filled 2019-03-14 (×9): qty 1

## 2019-03-14 MED ORDER — METOPROLOL TARTRATE 25 MG PO TABS: 50.0000 mg | ORAL_TABLET | Freq: Two times a day (BID) | ORAL | Status: DC

## 2019-03-14 MED ORDER — HYDROCOD POLST-CPM POLST ER 10-8 MG/5ML PO SUER
5.0000 mL | Freq: Two times a day (BID) | ORAL | Status: DC | PRN
  Administered 2019-03-15 – 2019-03-18 (×5): 5 mL via ORAL
  Filled 2019-03-14 (×6): qty 5

## 2019-03-14 MED ORDER — INSULIN ASPART 100 UNIT/ML ~~LOC~~ SOLN
0.0000 [IU] | Freq: Three times a day (TID) | SUBCUTANEOUS | Status: DC
  Administered 2019-03-14: 2 [IU] via SUBCUTANEOUS
  Administered 2019-03-15: 8 [IU] via SUBCUTANEOUS
  Administered 2019-03-15: 3 [IU] via SUBCUTANEOUS
  Administered 2019-03-15: 15 [IU] via SUBCUTANEOUS
  Administered 2019-03-16: 11 [IU] via SUBCUTANEOUS
  Administered 2019-03-16: 5 [IU] via SUBCUTANEOUS

## 2019-03-14 MED ORDER — DEXAMETHASONE SODIUM PHOSPHATE 10 MG/ML IJ SOLN
6.0000 mg | INTRAMUSCULAR | Status: DC
  Administered 2019-03-14 – 2019-03-17 (×4): 6 mg via INTRAVENOUS
  Filled 2019-03-14 (×4): qty 1

## 2019-03-14 MED ORDER — SODIUM CHLORIDE 0.9 % IV SOLN
100.0000 mg | Freq: Every day | INTRAVENOUS | Status: DC
  Administered 2019-03-15 – 2019-03-16 (×2): 100 mg via INTRAVENOUS
  Filled 2019-03-14 (×2): qty 100

## 2019-03-14 MED ORDER — VITAMIN C 500 MG PO TABS
500.0000 mg | ORAL_TABLET | Freq: Every day | ORAL | Status: DC
  Administered 2019-03-14 – 2019-03-18 (×5): 500 mg via ORAL
  Filled 2019-03-14 (×5): qty 1

## 2019-03-14 MED ORDER — ONDANSETRON HCL 4 MG PO TABS: 4.0000 mg | ORAL_TABLET | Freq: Four times a day (QID) | ORAL | Status: DC | PRN

## 2019-03-14 MED ORDER — GUAIFENESIN-DM 100-10 MG/5ML PO SYRP
10.0000 mL | ORAL_SOLUTION | ORAL | Status: DC | PRN
  Administered 2019-03-14 – 2019-03-17 (×4): 10 mL via ORAL
  Filled 2019-03-14 (×4): qty 10

## 2019-03-14 MED ORDER — APIXABAN 5 MG PO TABS
5.0000 mg | ORAL_TABLET | Freq: Two times a day (BID) | ORAL | Status: DC
  Administered 2019-03-14 – 2019-03-18 (×8): 5 mg via ORAL
  Filled 2019-03-14 (×8): qty 1

## 2019-03-14 MED ORDER — INSULIN ASPART 100 UNIT/ML ~~LOC~~ SOLN
0.0000 [IU] | Freq: Every day | SUBCUTANEOUS | Status: DC
  Administered 2019-03-14 – 2019-03-17 (×4): 2 [IU] via SUBCUTANEOUS

## 2019-03-14 MED ORDER — INSULIN DETEMIR 100 UNIT/ML ~~LOC~~ SOLN
30.0000 [IU] | Freq: Every day | SUBCUTANEOUS | Status: DC
  Filled 2019-03-14: qty 0.3

## 2019-03-14 MED ORDER — LOSARTAN POTASSIUM 50 MG PO TABS: 100.0000 mg | ORAL_TABLET | Freq: Every day | ORAL | Status: DC

## 2019-03-14 NOTE — H&P (Signed)
TRH H&P   Patient Demographics:    Vernon Ruiz, is a 68 y.o. male  MRN: [id number]   DOB - December 15, 1950  Admit Date - 03/14/2019  Outpatient Primary MD for the patient is Hulan Fess, MD  Referring MD/NP/PA: Direct admission  Patient coming from: Home  No chief complaint on file.     HPI:    Vernon Ruiz  is a 68 y.o. male, with medical history significant of HTN, CAD, A. fib on Eliquis, aortic stenosis s/p valve replacement, DM type II, right foot drop, and OSA, with recent hospitalization last September for mild CVA, with no residual deficits patient presents to [facility] for dyspnea, patient was diagnosed with COVID-19 11/27, multiple family members at his house were diagnosed including his wife and grandchildren, patient report symptoms started a week ago, reports initially some fever and chills, intermittent, this resolved, reports nausea, but no vomiting, poor appetite and decreased oral intake, he develops dyspnea initially exertional, then even at rest, as well reports cough, nonproductive, he denies any chest pain, but reports generalized body ache, patient was referred for direct admission by PCP given multiple risk factors including his stroke, diabetes, hypertension, chest x-ray on admission showing COVID-19 for pneumonia, patient was started on steroids, remdesivir, and convulsant plasma.     Results on care everywhere showing positive Covid antigen point-of-care on 11/27   POCT COVID BD (03/09/2019 2:41 PM EST) POCT COVID BD (03/09/2019 2:41 PM EST)  Component Value Ref Range Performed At Pathologist Signature  COVID Antigen, POC Positive (A) Negative PCRO5 01 [facility]   COVID Antigen Internal QC, POC Internal QC - Passed Internal QC - Passed PCRO5 01 [facility]RG[facility]   COVID KIT EXP DATE, POC 06/05/2019  PCRO5 01 [facility] [employer] [place]  PCRO5 01 [facility]RGENT CARE       Review of systems:    In addition to the HPI above,  Did report some fever and chills, has resolved, he reports generalized weakness, fatigue, and poor appetite No Headache, No changes with Vision or hearing, No problems swallowing food or Liquids, No Chest pain, he reports cough, and dyspnea No Abdominal pain, some nausea, but no vomiting, Bowel movements are regular, No Blood in stool or Urine, No dysuria, No new skin rashes or bruises, No new joints pains-aches,  No new weakness, tingling, numbness in any extremity, No recent weight gain or loss, No polyuria, polydypsia or polyphagia, No significant Mental Stressors.  A full 10 point Review of Systems was done, except as stated above, all other Review of Systems were negative.   With Past History of the following :    Past Medical History:  Diagnosis Date   Aortic stenosis    s/p AVR using a 90m Edwards Magna-Ease pericardial valve 10/04/16   Coronary artery disease    Diabetes mellitus without complication (  HCC)    GERD (gastroesophageal reflux disease)    Heart murmur    Hypertension    Obstructive sleep apnea    positive from a home test, still needs to get another study done   Persistent atrial fibrillation ([place])    s/p clipping of LA appendage 10/04/16   Right carotid bruit 12/12/2018   Right foot drop 11/07/2018      Past Surgical History:  Procedure Laterality Date   AORTIC VALVE REPLACEMENT N/A 10/04/2016   Procedure: AORTIC VALVE REPLACEMENT (AVR);  Surgeon: Gaye Pollack, MD;  Location: [facility];  Service: Open Heart Surgery;  Laterality: N/A;   APPENDECTOMY     CARDIAC CATHETERIZATION N/A 03/10/2016   Procedure: Right/Left Heart Cath and Coronary Angiography;  Surgeon: Sherren Mocha, MD;  Location: [facility];  Service: Cardiovascular;  Laterality: N/A;   CLIPPING OF ATRIAL APPENDAGE N/A 10/04/2016   Procedure: CLIPPING OF ATRIAL APPENDAGE;   Surgeon: Gaye Pollack, MD;  Location: [facility];  Service: Open Heart Surgery;  Laterality: N/A;   COLONOSCOPY     EYE SURGERY Right    growth removed from eye   HERNIA REPAIR Bilateral    inguinal   TEE WITHOUT CARDIOVERSION N/A 10/04/2016   Procedure: TRANSESOPHAGEAL ECHOCARDIOGRAM (TEE);  Surgeon: Gaye Pollack, MD;  Location: [facility];  Service: Open Heart Surgery;  Laterality: N/A;      Social History:     Social History   Tobacco Use   Smoking status: Former Smoker    Types: Cigars    Quit date: 09/16/2016    Years since quitting: 2.4   Smokeless tobacco: Never Used  Substance Use Topics   Alcohol use: Yes    Comment: very rare     Lives - at home  Mobility - Independent    Family History :   Family history was reviewed, there is no pertinent family history   Home Medications:   Prior to Admission medications   Medication Sig Start Date End Date Taking? Authorizing Provider  acetaminophen (TYLENOL) 325 MG tablet Take 2 tablets (650 mg total) by mouth every 6 (six) hours as needed for mild pain. 10/08/16  Yes Lars Pinks M, PA-C  amLODipine (NORVASC) 2.5 MG tablet Take 1 tablet (2.5 mg total) by mouth daily. 03/06/19 02/29/20 Yes Sherren Mocha, MD  apixaban (ELIQUIS) 5 MG TABS tablet Take 5 mg by mouth 2 (two) times daily.   Yes [provider]  Cyanocobalamin (VITAMIN B 12 PO) Take 100 mg by mouth every other day.    Yes [provider]  Dulaglutide (TRULICITY) 1.5 SW/1.0XN SOPN Inject 1.5 mg into the skin once a week. Sunday   Yes [provider]  empagliflozin (JARDIANCE) 25 MG TABS tablet Take 12.5 mg by mouth daily.   Yes [provider]  glimepiride (AMARYL) 4 MG tablet Take 4 mg by mouth 2 (two) times daily.  10/15/18  Yes [provider]  ibuprofen (ADVIL) 200 MG tablet Take 400 mg by mouth every 6 (six) hours as needed for headache.   Yes [provider]  losartan (COZAAR) 100 MG tablet Take 1  tablet (100 mg total) by mouth daily. 07/11/18 07/06/19 Yes Sherren Mocha, MD  metFORMIN (GLUCOPHAGE) 1000 MG tablet Take 1,000 mg by mouth 2 (two) times daily.  06/14/16  Yes [provider]  metoprolol tartrate (LOPRESSOR) 50 MG tablet Take 50 mg by mouth 2 (two) times daily.   Yes [provider]  atorvastatin (  LIPITOR) 20 MG tablet Take 1 tablet (20 mg total) by mouth daily at 6 PM. 12/13/18 01/12/19  Barb Merino, MD  enoxaparin (LOVENOX) 150 MG/ML injection Inject 1 mL (150 mg total) into the skin daily. 02/05/19   Sherren Mocha, MD  Insulin Glargine (TOUJEO SOLOSTAR [place]) Inject 40 Units into the skin every evening.     [provider]     Allergies:     Allergies  Allergen Reactions   Penicillins Itching and Rash    All over body  Has patient had a PCN reaction causing immediate rash, facial/tongue/throat swelling, SOB or lightheadedness with hypotension: yes Has patient had a PCN reaction causing severe rash involving mucus membranes or skin necrosis: no Has patient had a PCN reaction that required hospitalization no Has patient had a PCN reaction occurring within the last 10 years: no If all of the above answers are "NO", then may proceed with Cephalosporin use.   Sulfa Antibiotics Rash    Rash   Sulfamethoxazole Rash     Physical Exam:   Vitals  Blood pressure 101/83, pulse 74, temperature 98.4 F (36.9 C), temperature source Oral, resp. rate 20, height '5\' 5"'  (1.651 m), weight 88 kg, SpO2 97 %.   1. General well developed male, lying in bed in NAD,    2. Normal affect and insight, Not Suicidal or Homicidal, Awake Alert, Oriented X 3.  3. No F.N deficits, ALL C.Nerves Intact, Strength 5/5 all 4 extremities, Sensation intact all 4 extremities, Plantars down going.  4. Ears and Eyes appear Normal, Conjunctivae clear, PERRLA. Moist Oral Mucosa.  5. Supple Neck, No JVD, No cervical lymphadenopathy appriciated, No Carotid Bruits.  6.  Symmetrical Chest wall movement, Good air movement bilaterally, CTAB.  7. RRR, No Gallops, Rubs , No Parasternal Heave.  8. Positive Bowel Sounds, Abdomen Soft, No tenderness, No organomegaly appriciated,No rebound -guarding or rigidity.  9.  No Cyanosis, Normal Skin Turgor, No Skin Rash or Bruise.  10. Good muscle tone,  joints appear normal , no effusions, Normal ROM.  11. No Palpable Lymph Nodes in Neck or Axillae     Data Review:    CBC Recent Labs  Lab 03/14/19 1445  WBC 7.4  HGB 15.8  HCT 49.0  PLT 207  MCV 81.5  MCH 26.3  MCHC 32.2  RDW 14.7  LYMPHSABS 2.8  MONOABS 0.6  EOSABS 0.0  BASOSABS 0.0   ------------------------------------------------------------------------------------------------------------------  Chemistries  Recent Labs  Lab 03/14/19 1445  NA 138  K 4.6  CL 99  CO2 26  GLUCOSE 162*  BUN 24*  CREATININE 1.06  CALCIUM 9.3  AST 27  ALT 28  ALKPHOS 62  BILITOT 0.7   ------------------------------------------------------------------------------------------------------------------ estimated creatinine clearance is 69 mL/min (by C-G formula based on SCr of 1.06 mg/dL). ------------------------------------------------------------------------------------------------------------------ No results for input(s): TSH, T4TOTAL, T3FREE, THYROIDAB in the last 72 hours.  Invalid input(s): FREET3  Coagulation profile No results for input(s): INR, PROTIME in the last 168 hours. ------------------------------------------------------------------------------------------------------------------- Recent Labs    03/14/19 1445  DDIMER 0.46   -------------------------------------------------------------------------------------------------------------------  Cardiac Enzymes No results for input(s): CKMB, TROPONINI, MYOGLOBIN in the last 168 hours.  Invalid input(s):  CK ------------------------------------------------------------------------------------------------------------------ No results found for: BNP   ---------------------------------------------------------------------------------------------------------------  Urinalysis    Component Value Date/Time   COLORURINE YELLOW 09/30/2016 1100   APPEARANCEUR CLEAR 09/30/2016 1100   LABSPEC 1.024 09/30/2016 1100   PHURINE 5.0 09/30/2016 1100   GLUCOSEU >=500 (A) 09/30/2016 1100   HGBUR NEGATIVE 09/30/2016 1100  BILIRUBINUR NEGATIVE 09/30/2016 [place] 09/30/2016 1100   PROTEINUR NEGATIVE 09/30/2016 1100   NITRITE NEGATIVE 09/30/2016 [place] 09/30/2016 1100    ----------------------------------------------------------------------------------------------------------------   Imaging Results:    Dg Chest Port 1 View  Result Date: 03/14/2019 CLINICAL DATA:  Shortness of breath, COVID-19 EXAM: PORTABLE CHEST 1 VIEW COMPARISON:  Portable exam 1422 hours compared to 11/17/2016 FINDINGS: Upper normal size of cardiac silhouette post AVR and LEFT atrial appendage clipping. Mediastinal contours and pulmonary vascularity normal. Decreased lung volumes with mild infiltrates at the mid to lower lungs consistent with pneumonia. Upper lungs clear. No pleural effusion or pneumothorax. Lytic focus at proximal LEFT humerus versus superimposed loose body not seen on prior exam. Bones appear demineralized. IMPRESSION: Bibasilar infiltrates consistent with pneumonia. Electronically Signed   By: Lavonia Dana M.D.   On: 03/14/2019 14:36    My personal review of EKG: Rhythm NSR, Rate  83 /min, QTc 439 , no Acute ST changes   Assessment & Plan:    Active Problems:   Paroxysmal atrial fibrillation (HCC)   OSA (obstructive sleep apnea)   S/P AVR   HTN (hypertension)   CVA (cerebral vascular accident) ([place])   COVID-19   COVID-19 for pneumonia -Patient presents with dyspnea,  cough, chest x-ray significant for multifocal opacity, patient with known history of hypertension, diabetes, hyperlipidemia, CVA, he is a significantly increased risk for developing acute respiratory failure/ARDS. -Patient will be admitted to telemetry floor, will start on IV remdesivir given opacity on imaging, pending LFTs results. -We will start on p.o. Decadron. -Discussed convulsant plasma with the patient, he is agreeable, will proceed with convalescent plasma transfusion. -Discussed incentive spirometry and flutter valve with the patient, and out of bed to chair daytime. -We will obtain inflammatory markers, and will trend daily.  History of CVA -Has any new focal deficits, continue with statin and Eliquis.  History of paroxysmal A. Fib. -Continue with Eliquis. -Continue normal sinus rhythm, continue with metoprolol.  Diabetes mellitus type 2, insulin-dependent -resume Levemir at a lower dose, will start on a sliding scale, continue with Amaryl. -We will hold Metformin, Jardiance and Trulicity  OSA -No CPAP per COVID-19 protocol during hospital stay  Hypertension -Blood pressure appears to be soft, discontinue amlodipine and losartan, and decrease metoprolol by half  History of aortic stenosis status post AVR -Stable clinically   DVT Prophylaxis on Eliquis for A Fib  AM Labs Ordered, also please review Full Orders  Family Communication: Admission, patients condition and plan of care including tests being ordered have been discussed with the patient and wife who indicate understanding and agree with the plan and Code Status.  Code Status Full  Likely DC to  Home  Condition GUARDED    Consults called: None  Admission status: inpatient  Time spent in minutes : 60 minutes   Phillips Climes M.D on 03/14/2019 at 4:56 PM  Between 7am to 7pm - Pager - [phone]. After 7pm go to [email or web address] - password [facility]  [facility] - Office  [phone]

## 2019-03-14 NOTE — Progress Notes (Addendum)
Patient wants to talk to the MD in the morning before getting plasma Transfusion. RN explained the procedure and the treatment to the patient but still not comfortable getting the plasma stated "I will talk to my PCP first before gettng it". Will continue to monitor.

## 2019-03-15 ENCOUNTER — Other Ambulatory Visit: Payer: Self-pay

## 2019-03-15 DIAGNOSIS — I480 Paroxysmal atrial fibrillation: Secondary | ICD-10-CM | POA: Diagnosis not present

## 2019-03-15 DIAGNOSIS — U071 COVID-19: Secondary | ICD-10-CM | POA: Diagnosis not present

## 2019-03-15 DIAGNOSIS — I10 Essential (primary) hypertension: Secondary | ICD-10-CM | POA: Diagnosis not present

## 2019-03-15 LAB — CBC WITH DIFFERENTIAL/PLATELET
Abs Immature Granulocytes: 0.06 10*3/uL (ref 0.00–0.07)
Basophils Absolute: 0 10*3/uL (ref 0.0–0.1)
Basophils Relative: 0 %
Eosinophils Absolute: 0 10*3/uL (ref 0.0–0.5)
Eosinophils Relative: 0 %
HCT: 45.6 % (ref 39.0–52.0)
Hemoglobin: 14.4 g/dL (ref 13.0–17.0)
Immature Granulocytes: 1 %
Lymphocytes Relative: 29 %
Lymphs Abs: 1.3 10*3/uL (ref 0.7–4.0)
MCH: 25.7 pg — ABNORMAL LOW (ref 26.0–34.0)
MCHC: 31.6 g/dL (ref 30.0–36.0)
MCV: 81.4 fL (ref 80.0–100.0)
Monocytes Absolute: 0.2 10*3/uL (ref 0.1–1.0)
Monocytes Relative: 3 %
Neutro Abs: 3 10*3/uL (ref 1.7–7.7)
Neutrophils Relative %: 67 %
Platelets: 182 10*3/uL (ref 150–400)
RBC: 5.6 MIL/uL (ref 4.22–5.81)
RDW: 14.7 % (ref 11.5–15.5)
WBC: 4.5 10*3/uL (ref 4.0–10.5)
nRBC: 0 % (ref 0.0–0.2)

## 2019-03-15 LAB — COMPREHENSIVE METABOLIC PANEL
ALT: 26 U/L (ref 0–44)
AST: 25 U/L (ref 15–41)
Albumin: 4.1 g/dL (ref 3.5–5.0)
Alkaline Phosphatase: 59 U/L (ref 38–126)
Anion gap: 13 (ref 5–15)
BUN: 24 mg/dL — ABNORMAL HIGH (ref 8–23)
CO2: 22 mmol/L (ref 22–32)
Calcium: 8.9 mg/dL (ref 8.9–10.3)
Chloride: 99 mmol/L (ref 98–111)
Creatinine, Ser: 0.84 mg/dL (ref 0.61–1.24)
GFR calc Af Amer: >60 mL/min (ref >60)
GFR calc non Af Amer: >60 mL/min (ref >60)
Glucose, Bld: 202 mg/dL — ABNORMAL HIGH (ref 70–99)
Potassium: 4.7 mmol/L (ref 3.5–5.1)
Sodium: 134 mmol/L — ABNORMAL LOW (ref 135–145)
Total Bilirubin: 0.7 mg/dL (ref 0.3–1.2)
Total Protein: 7.3 g/dL (ref 6.5–8.1)

## 2019-03-15 LAB — PREPARE FRESH FROZEN PLASMA

## 2019-03-15 LAB — BPAM FFP
Blood Product Expiration Date: 202012040125
ISSUE DATE / TIME: 202012030136
Unit Type and Rh: 8400

## 2019-03-15 LAB — FERRITIN: Ferritin: 711 ng/mL — ABNORMAL HIGH (ref 24–336)

## 2019-03-15 LAB — HEMOGLOBIN A1C
Hgb A1c MFr Bld: 7.9 % — ABNORMAL HIGH (ref 4.8–5.6)
Mean Plasma Glucose: 180.03 mg/dL

## 2019-03-15 LAB — D-DIMER, QUANTITATIVE: D-Dimer, Quant: 0.4 ug/mL-FEU (ref 0.00–0.50)

## 2019-03-15 LAB — C-REACTIVE PROTEIN: CRP: 1.7 mg/dL — ABNORMAL HIGH (ref <1.0)

## 2019-03-15 LAB — GLUCOSE, CAPILLARY
Glucose-Capillary: 157 mg/dL — ABNORMAL HIGH (ref 70–99)
Glucose-Capillary: 362 mg/dL — ABNORMAL HIGH (ref 70–99)

## 2019-03-15 MED ORDER — INSULIN ASPART 100 UNIT/ML ~~LOC~~ SOLN
3.0000 [IU] | Freq: Three times a day (TID) | SUBCUTANEOUS | Status: DC
  Administered 2019-03-15 – 2019-03-18 (×9): 3 [IU] via SUBCUTANEOUS

## 2019-03-15 NOTE — Progress Notes (Signed)
Inpatient Diabetes Program Recommendations  AACE/ADA: New Consensus Statement on Inpatient Glycemic Control (2015)  Target Ranges:  Prepandial:   less than 140 mg/dL      Peak postprandial:   less than 180 mg/dL (1-2 hours)      Critically ill patients:  140 - 180 mg/dL   Lab Results  Component Value Date   GLUCAP 362 (H) 03/15/2019   HGBA1C 7.9 (H) 03/15/2019    Review of Glycemic Control Results for Vernon Ruiz, Vernon Ruiz (MRN [id number]) as of 03/15/2019 12:07  Ref. Range 03/15/2019 07:19 03/15/2019 11:35  Glucose-Capillary Latest Ref Range: 70 - 99 mg/dL 157 (H) 362 (H)   Diabetes history: DM 2 Outpatient Diabetes medications: Lantus 40 units qhs, Jardiance 12.5 mg Daily, Amaryl 4 mg BID, Trulicity 1.5 mg Weekly, Metformin 1000 mg bid Current orders for Inpatient glycemic control:  Levemir 20 units qhs Novolog 0-15 units tid + hs Amaryl 4 mg bid  A1c 7.9% on 12/3 Decadron 6 mg Q24 hours BUN/Creat: 24/0.84 No PO supplementation currently  Inpatient Diabetes Program Recommendations:    Consider Novolog 5 units tid meal coverage if pt eats >50% of meals.  Thanks,  Tama Headings RN, MSN, BC-ADM Inpatient Diabetes Coordinator Team Pager [phone] (8a-5p)

## 2019-03-15 NOTE — Progress Notes (Signed)
Updated daughter Ames Dura.

## 2019-03-15 NOTE — Progress Notes (Signed)
PROGRESS NOTE                                                                                                                                                                                                             Patient Demographics:    Vernon Ruiz, is a 68 y.o. male, DOB - [birth year], KGS:[id number]  Admit date - 03/14/2019   Admitting Physician Dewayne Shorter Levora Dredge, MD  Outpatient Primary MD for the patient is [name], MD  LOS - 1   No chief complaint on file.      Brief Narrative    68 y.o. male, with medical history significant ofHTN, CAD, A. fib on Eliquis, aortic stenosis s/pvalve replacement, DM type II, right foot drop, and OSA, with recent hospitalization last September for mild CVA, with no residual deficits patient presents to [facility] for dyspnea, chest x-ray significant for 19 for pneumonia, he was admitted for further management.   Subjective:    Vernon Ruiz today complains of cough which report it did worsen today, but reports dyspnea at baseline, mainly exertional .   Assessment  & Plan :    Active Problems:   Paroxysmal atrial fibrillation (HCC)   OSA (obstructive sleep apnea)   S/P AVR   HTN (hypertension)   CVA (cerebral vascular accident) (HCC)   COVID-19  COVID-19 for pneumonia -Patient presents with dyspnea, cough, chest x-ray significant for multifocal opacity, patient with known history of hypertension, diabetes, hyperlipidemia, CVA, he is a significantly increased risk for developing acute respiratory failure/ARDS. -Cussed with patient, he was encouraged use incentive spirometry, flutter valve, and to get out of bed to chair, and to prone once back in bed. -Continue with Decadron -Continue with remdesivir, monitor LFTs closely -Patient currently does not want to get convalescent plasma, as he reports he is already feeling better    COVID-19 Labs  Recent Labs    03/14/19 1445 03/15/19  0018  DDIMER 0.46 0.40  FERRITIN 671* 711*  CRP 1.5* 1.7*    Lab Results  Component Value Date   SARSCOV2NAA NEGATIVE 12/12/2018    History of CVA - continue with statin and Eliquis.  History of paroxysmal A. Fib. -Continue with Eliquis. -Continue normal sinus rhythm, continue with metoprolol.  Diabetes mellitus type 2, insulin-dependent -CBG are labile, will avoid increasing his Levemir to prevent  hypoglycemia, will tolerate some degree of hyper glycemia . -Continue with insulin sliding scale, will add 3 units before meals . -We will hold Metformin, Jardiance and Trulicity, will continue with Amaryl meanwhile  OSA -No CPAP per COVID-19 protocol during hospital stay  Hypertension -Blood pressure is acceptable, continue to hold amlodipine and losartan, and continue with metoprolol at half dose .  History of aortic stenosis status post AVR -Stable clinically   Code Status : Full  Family Communication  : D/W wife via phone.  Disposition Plan  : Home  Barriers for discharge: Patient remains on IV remdesivir, IV steroids, will need close monitoring as high risk to develop worsening respiratory failure due to COVID-19 pneumonia. Consults  :  None  Procedures  : none  DVT Prophylaxis  :  On Eliquis for A Fib  Lab Results  Component Value Date   PLT 182 03/15/2019    Antibiotics  :    Anti-infectives (From admission, onward)   Start     Dose/Rate Route Frequency Ordered Stop   03/15/19 1600  remdesivir 100 mg in sodium chloride 0.9 % 250 mL IVPB     100 mg 500 mL/hr over 30 Minutes Intravenous Daily 03/14/19 1659 03/19/19 0959   03/15/19 1430  remdesivir 100 mg in sodium chloride 0.9 % 250 mL IVPB  Status:  Discontinued     100 mg 500 mL/hr over 30 Minutes Intravenous Every 24 hours 03/14/19 1405 03/14/19 1407   03/14/19 1800  remdesivir 200 mg in sodium chloride 0.9 % 250 mL IVPB     200 mg 500 mL/hr over 30 Minutes Intravenous Once 03/14/19 1659 03/14/19  1855   03/14/19 1430  remdesivir 200 mg in sodium chloride 0.9 % 250 mL IVPB  Status:  Discontinued     200 mg 500 mL/hr over 30 Minutes Intravenous Once 03/14/19 1405 03/14/19 1407        Objective:   Vitals:   03/15/19 0423 03/15/19 0424 03/15/19 0814 03/15/19 1138  BP: 107/67   126/68  Pulse: 65     Resp: (!) 21     Temp:  (!) 97.5 F (36.4 C) 98.6 F (37 C) 98.6 F (37 C)  TempSrc:  Axillary Oral Oral  SpO2: 93%     Weight:      Height:        Wt Readings from Last 3 Encounters:  03/14/19 88 kg  12/26/18 93.8 kg  12/12/18 84.4 kg     Intake/Output Summary (Last 24 hours) at 03/15/2019 1628 Last data filed at 03/15/2019 1500 Gross per 24 hour  Intake 900 ml  Output 1375 ml  Net -475 ml     Physical Exam  Awake Alert, Oriented X 3, No new F.N deficits, Normal affect Symmetrical Chest wall movement, Good air movement bilaterally, CTAB RRR,No Gallops,Rubs , No Parasternal Heave +ve B.Sounds, Abd Soft, No tenderness, No rebound - guarding or rigidity. No Cyanosis, Clubbing or edema, No new Rash or bruise      Data Review:    CBC Recent Labs  Lab 03/14/19 1445 03/15/19 0018  WBC 7.4 4.5  HGB 15.8 14.4  HCT 49.0 45.6  PLT 207 182  MCV 81.5 81.4  MCH 26.3 25.7*  MCHC 32.2 31.6  RDW 14.7 14.7  LYMPHSABS 2.8 1.3  MONOABS 0.6 0.2  EOSABS 0.0 0.0  BASOSABS 0.0 0.0    Chemistries  Recent Labs  Lab 03/14/19 1445 03/15/19 0018  NA 138 134*  K 4.6 4.7  CL 99 99  CO2 26 22  GLUCOSE 162* 202*  BUN 24* 24*  CREATININE 1.06 0.84  CALCIUM 9.3 8.9  AST 27 25  ALT 28 26  ALKPHOS 62 59  BILITOT 0.7 0.7   ------------------------------------------------------------------------------------------------------------------ No results for input(s): CHOL, HDL, LDLCALC, TRIG, CHOLHDL, LDLDIRECT in the last 72 hours.  Lab Results  Component Value Date   HGBA1C 7.9 (H) 03/15/2019    ------------------------------------------------------------------------------------------------------------------ No results for input(s): TSH, T4TOTAL, T3FREE, THYROIDAB in the last 72 hours.  Invalid input(s): FREET3 ------------------------------------------------------------------------------------------------------------------ Recent Labs    03/14/19 1445 03/15/19 0018  FERRITIN 671* 711*    Coagulation profile No results for input(s): INR, PROTIME in the last 168 hours.  Recent Labs    03/14/19 1445 03/15/19 0018  DDIMER 0.46 0.40    Cardiac Enzymes No results for input(s): CKMB, TROPONINI, MYOGLOBIN in the last 168 hours.  Invalid input(s): CK ------------------------------------------------------------------------------------------------------------------    Component Value Date/Time   BNP 24.8 03/14/2019 1445    Inpatient Medications  Scheduled Meds: . sodium chloride   Intravenous Once  . apixaban  5 mg Oral BID  . atorvastatin  20 mg Oral q1800  . dexamethasone (DECADRON) injection  6 mg Intravenous Q24H  . glimepiride  4 mg Oral BID  . insulin aspart  0-15 Units Subcutaneous TID WC  . insulin aspart  0-5 Units Subcutaneous QHS  . insulin detemir  20 Units Subcutaneous QHS  . metoprolol tartrate  25 mg Oral BID  . senna  1 tablet Oral BID  . vitamin B-12  100 mcg Oral QODAY  . vitamin C  500 mg Oral Daily  . zinc sulfate  220 mg Oral Daily   Continuous Infusions: . remdesivir 100 mg in NS 250 mL     PRN Meds:.acetaminophen, chlorpheniramine-HYDROcodone, guaiFENesin-dextromethorphan, ondansetron **OR** ondansetron (ZOFRAN) IV  Micro Results No results found for this or any previous visit (from the past 240 hour(s)).  Radiology Reports Dg Chest Port 1 View  Result Date: 03/14/2019 CLINICAL DATA:  Shortness of breath, COVID-19 EXAM: PORTABLE CHEST 1 VIEW COMPARISON:  Portable exam 1422 hours compared to 11/17/2016 FINDINGS: Upper normal size of  cardiac silhouette post AVR and LEFT atrial appendage clipping. Mediastinal contours and pulmonary vascularity normal. Decreased lung volumes with mild infiltrates at the mid to lower lungs consistent with pneumonia. Upper lungs clear. No pleural effusion or pneumothorax. Lytic focus at proximal LEFT humerus versus superimposed loose body not seen on prior exam. Bones appear demineralized. IMPRESSION: Bibasilar infiltrates consistent with pneumonia. Electronically Signed   By: Ulyses Southward M.D.   On: 03/14/2019 14:36      Huey Bienenstock M.D on 03/15/2019 at 4:28 PM  Between 7am to 7pm - Pager - [phone]  After 7pm go to [email or web address] - password [facility]  [facility] -  Office  [phone]

## 2019-03-16 ENCOUNTER — Inpatient Hospital Stay (HOSPITAL_COMMUNITY): Payer: Medicare Other

## 2019-03-16 DIAGNOSIS — U071 COVID-19: Secondary | ICD-10-CM | POA: Diagnosis not present

## 2019-03-16 DIAGNOSIS — I63432 Cerebral infarction due to embolism of left posterior cerebral artery: Secondary | ICD-10-CM | POA: Diagnosis not present

## 2019-03-16 DIAGNOSIS — I480 Paroxysmal atrial fibrillation: Secondary | ICD-10-CM | POA: Diagnosis not present

## 2019-03-16 DIAGNOSIS — I10 Essential (primary) hypertension: Secondary | ICD-10-CM | POA: Diagnosis not present

## 2019-03-16 LAB — CBC WITH DIFFERENTIAL/PLATELET
Abs Immature Granulocytes: 0.01 10*3/uL (ref 0.00–0.07)
Basophils Absolute: 0 10*3/uL (ref 0.0–0.1)
Basophils Relative: 0 %
Eosinophils Absolute: 0 10*3/uL (ref 0.0–0.5)
Eosinophils Relative: 0 %
HCT: 43.1 % (ref 39.0–52.0)
Hemoglobin: 13.8 g/dL (ref 13.0–17.0)
Immature Granulocytes: 0 %
Lymphocytes Relative: 33 %
Lymphs Abs: 1.4 10*3/uL (ref 0.7–4.0)
MCH: 25.7 pg — ABNORMAL LOW (ref 26.0–34.0)
MCHC: 32 g/dL (ref 30.0–36.0)
MCV: 80.4 fL (ref 80.0–100.0)
Monocytes Absolute: 0.1 10*3/uL (ref 0.1–1.0)
Monocytes Relative: 3 %
Neutro Abs: 2.7 10*3/uL (ref 1.7–7.7)
Neutrophils Relative %: 64 %
Platelets: 188 10*3/uL (ref 150–400)
RBC: 5.36 MIL/uL (ref 4.22–5.81)
RDW: 14.3 % (ref 11.5–15.5)
WBC: 4.2 10*3/uL (ref 4.0–10.5)
nRBC: 0 % (ref 0.0–0.2)

## 2019-03-16 LAB — COMPREHENSIVE METABOLIC PANEL
ALT: 25 U/L (ref 0–44)
AST: 24 U/L (ref 15–41)
Albumin: 3.5 g/dL (ref 3.5–5.0)
Alkaline Phosphatase: 55 U/L (ref 38–126)
Anion gap: 12 (ref 5–15)
BUN: 38 mg/dL — ABNORMAL HIGH (ref 8–23)
CO2: 21 mmol/L — ABNORMAL LOW (ref 22–32)
Calcium: 8.8 mg/dL — ABNORMAL LOW (ref 8.9–10.3)
Chloride: 102 mmol/L (ref 98–111)
Creatinine, Ser: 0.93 mg/dL (ref 0.61–1.24)
GFR calc Af Amer: >60 mL/min (ref >60)
GFR calc non Af Amer: >60 mL/min (ref >60)
Glucose, Bld: 330 mg/dL — ABNORMAL HIGH (ref 70–99)
Potassium: 4.7 mmol/L (ref 3.5–5.1)
Sodium: 135 mmol/L (ref 135–145)
Total Bilirubin: 0.6 mg/dL (ref 0.3–1.2)
Total Protein: 6.8 g/dL (ref 6.5–8.1)

## 2019-03-16 LAB — D-DIMER, QUANTITATIVE: D-Dimer, Quant: 0.39 ug/mL-FEU (ref 0.00–0.50)

## 2019-03-16 LAB — FERRITIN: Ferritin: 548 ng/mL — ABNORMAL HIGH (ref 24–336)

## 2019-03-16 LAB — GLUCOSE, CAPILLARY
Glucose-Capillary: 237 mg/dL — ABNORMAL HIGH (ref 70–99)
Glucose-Capillary: 240 mg/dL — ABNORMAL HIGH (ref 70–99)
Glucose-Capillary: 317 mg/dL — ABNORMAL HIGH (ref 70–99)
Glucose-Capillary: 242 mg/dL — ABNORMAL HIGH (ref 70–99)
Glucose-Capillary: 318 mg/dL — ABNORMAL HIGH (ref 70–99)
Glucose-Capillary: 249 mg/dL — ABNORMAL HIGH (ref 70–99)

## 2019-03-16 LAB — C-REACTIVE PROTEIN: CRP: 1.4 mg/dL — ABNORMAL HIGH (ref <1.0)

## 2019-03-16 MED ORDER — INSULIN DETEMIR 100 UNIT/ML ~~LOC~~ SOLN
26.0000 [IU] | Freq: Every day | SUBCUTANEOUS | Status: DC
  Administered 2019-03-16 – 2019-03-17 (×2): 26 [IU] via SUBCUTANEOUS
  Filled 2019-03-16 (×3): qty 0.26

## 2019-03-16 MED ORDER — SODIUM CHLORIDE 0.9 % IV SOLN
100.0000 mg | Freq: Every day | INTRAVENOUS | Status: AC
  Administered 2019-03-17 – 2019-03-18 (×2): 100 mg via INTRAVENOUS
  Filled 2019-03-16 (×4): qty 20

## 2019-03-16 MED ORDER — INSULIN ASPART 100 UNIT/ML ~~LOC~~ SOLN
0.0000 [IU] | Freq: Three times a day (TID) | SUBCUTANEOUS | Status: DC
  Administered 2019-03-16 – 2019-03-17 (×2): 7 [IU] via SUBCUTANEOUS
  Administered 2019-03-17: 4 [IU] via SUBCUTANEOUS
  Administered 2019-03-17: 11 [IU] via SUBCUTANEOUS
  Administered 2019-03-18 (×2): 4 [IU] via SUBCUTANEOUS

## 2019-03-16 NOTE — Progress Notes (Signed)
Ambulated in hall today. Tolerated well. O2 sats maintain 94-97% w/ ambulation. Denies shortness of breath. Continue to monitor  IS use and encouraging use.

## 2019-03-16 NOTE — Progress Notes (Signed)
PROGRESS NOTE                                                                                                                                                                                                             Patient Demographics:    Vernon Ruiz, is a 68 y.o. male, DOB - 1951/03/25, KGM:[id number]  Admit date - 03/14/2019   Admitting Physician Jonetta Osgood, MD  Outpatient Primary MD for the patient is Hulan Fess, MD  LOS - 2   No chief complaint on file.      Brief Narrative    68 y.o. male, with medical history significant ofHTN, CAD, A. fib on Eliquis, aortic stenosis s/pvalve replacement, DM type II, right foot drop, and OSA, with recent hospitalization last September for mild CVA, with no residual deficits patient presents to [facility] for dyspnea, chest x-ray significant for 19 for pneumonia, he was admitted for further management.   Subjective:    [name] today complains of cough which report it did worsen today, but reports dyspnea at baseline, mainly exertional .   Assessment  & Plan :    Active Problems:   Paroxysmal atrial fibrillation (HCC)   OSA (obstructive sleep apnea)   S/P AVR   HTN (hypertension)   CVA (cerebral vascular accident) ([place])   COVID-19  COVID-19 for pneumonia -Patient presents with dyspnea, cough, chest x-ray significant for multifocal opacity, patient with known history of hypertension, diabetes, hyperlipidemia, CVA, he is a significantly increased risk for developing acute respiratory failure/ARDS. -Repeat chest x-ray this morning showing worsening opacity in the left lung with poor inspiratory effort, I have discussed at length with the patient, he was encouraged use incentive spirometry, flutter valve, to get out of bed to chair, and to prone once back in the bed, discussed with staff, they will ambulate in the hallway today as well. -Continue with Decadron -Continue with  remdesivir, monitor LFTs closely -Patient currently does not want to get convalescent plasma, as he reports he is already feeling better    COVID-19 Labs  Recent Labs    03/14/19 1445 03/15/19 0018 03/16/19 0030  DDIMER 0.46 0.40 0.39  FERRITIN 671* 711* 548*  CRP 1.5* 1.7* 1.4*    Lab Results  Component Value Date   [place] NEGATIVE 12/12/2018    History of CVA -  continue with statin and Eliquis.  History of paroxysmal A. Fib. -Continue with Eliquis. -Continue normal sinus rhythm, continue with metoprolol.  Diabetes mellitus type 2, insulin-dependent -CBG uncontrolled, will increase Levemir to 26 units and change sliding scale to resistant, and continue with 3 units NovoLog before meals . -We will hold Metformin, Jardiance and Trulicity, will continue with Amaryl meanwhile  OSA -No CPAP per COVID-19 protocol during hospital stay  Hypertension -Blood pressure is acceptable, continue to hold amlodipine and losartan, and continue with metoprolol at half dose .  History of aortic stenosis status post AVR -Stable clinically   Code Status : Full  Family Communication  : D/W wife via phone.  Disposition Plan  : Home  Barriers for discharge: Patient remains on IV remdesivir, IV steroids, will need close monitoring as high risk to develop worsening respiratory failure due to COVID-19 pneumonia. Consults  :  None  Procedures  : none  DVT Prophylaxis  :  On Eliquis for A Fib  Lab Results  Component Value Date   PLT 188 03/16/2019    Antibiotics  :    Anti-infectives (From admission, onward)   Start     Dose/Rate Route Frequency Ordered Stop   03/15/19 1600  remdesivir 100 mg in sodium chloride 0.9 % 250 mL IVPB     100 mg 500 mL/hr over 30 Minutes Intravenous Daily 03/14/19 1659 03/19/19 0959   03/15/19 1430  remdesivir 100 mg in sodium chloride 0.9 % 250 mL IVPB  Status:  Discontinued     100 mg 500 mL/hr over 30 Minutes Intravenous Every 24  hours 03/14/19 1405 03/14/19 1407   03/14/19 1800  remdesivir 200 mg in sodium chloride 0.9 % 250 mL IVPB     200 mg 500 mL/hr over 30 Minutes Intravenous Once 03/14/19 1659 03/14/19 1855   03/14/19 1430  remdesivir 200 mg in sodium chloride 0.9 % 250 mL IVPB  Status:  Discontinued     200 mg 500 mL/hr over 30 Minutes Intravenous Once 03/14/19 1405 03/14/19 1407        Objective:   Vitals:   03/16/19 0507 03/16/19 0915 03/16/19 1218 03/16/19 1533  BP: 114/70 121/71 125/77 (!) 152/82  Pulse: (!) 56  65 64  Resp:  16 18 17   Temp: 98.1 F (36.7 C) 98.1 F (36.7 C) 98 F (36.7 C) 98.1 F (36.7 C)  TempSrc: Oral Oral Oral Oral  SpO2: (!) 89% 96% 95% 94%  Weight:      Height:        Wt Readings from Last 3 Encounters:  03/14/19 88 kg  12/26/18 93.8 kg  12/12/18 84.4 kg     Intake/Output Summary (Last 24 hours) at 03/16/2019 1617 Last data filed at 03/16/2019 1600 Gross per 24 hour  Intake 1450.19 ml  Output 400 ml  Net 1050.19 ml     Physical Exam  Awake Alert, Oriented X 3, No new F.N deficits, Normal affect Symmetrical Chest wall movement, Minister entry at the bases, no wheezing RRR,No Gallops,Rubs or new Murmurs, No Parasternal Heave +ve B.Sounds, Abd Soft, No tenderness, No rebound - guarding or rigidity. No Cyanosis, Clubbing or edema, No new Rash or bruise       Data Review:    CBC Recent Labs  Lab 03/14/19 1445 03/15/19 0018 03/16/19 0030  WBC 7.4 4.5 4.2  HGB 15.8 14.4 13.8  HCT 49.0 45.6 43.1  PLT 207 182 188  MCV 81.5 81.4 80.4  MCH 26.3 25.7*  25.7*  MCHC 32.2 31.6 32.0  RDW 14.7 14.7 14.3  LYMPHSABS 2.8 1.3 1.4  MONOABS 0.6 0.2 0.1  EOSABS 0.0 0.0 0.0  BASOSABS 0.0 0.0 0.0    Chemistries  Recent Labs  Lab 03/14/19 1445 03/15/19 0018 03/16/19 0030  NA 138 134* 135  K 4.6 4.7 4.7  CL 99 99 102  CO2 26 22 21*  GLUCOSE 162* 202* 330*  BUN 24* 24* 38*  CREATININE 1.06 0.84 0.93  CALCIUM 9.3 8.9 8.8*  AST 27 25 24   ALT 28 26 25    ALKPHOS 62 59 55  BILITOT 0.7 0.7 0.6   ------------------------------------------------------------------------------------------------------------------ No results for input(s): CHOL, HDL, LDLCALC, TRIG, CHOLHDL, LDLDIRECT in the last 72 hours.  Lab Results  Component Value Date   HGBA1C 7.9 (H) 03/15/2019   ------------------------------------------------------------------------------------------------------------------ No results for input(s): TSH, T4TOTAL, T3FREE, THYROIDAB in the last 72 hours.  Invalid input(s): FREET3 ------------------------------------------------------------------------------------------------------------------ Recent Labs    03/15/19 0018 03/16/19 0030  FERRITIN 711* 548*    Coagulation profile No results for input(s): INR, PROTIME in the last 168 hours.  Recent Labs    03/15/19 0018 03/16/19 0030  DDIMER 0.40 0.39    Cardiac Enzymes No results for input(s): CKMB, TROPONINI, MYOGLOBIN in the last 168 hours.  Invalid input(s): CK ------------------------------------------------------------------------------------------------------------------    Component Value Date/Time   BNP 24.8 03/14/2019 1445    Inpatient Medications  Scheduled Meds: . sodium chloride   Intravenous Once  . apixaban  5 mg Oral BID  . atorvastatin  20 mg Oral q1800  . dexamethasone (DECADRON) injection  6 mg Intravenous Q24H  . glimepiride  4 mg Oral BID  . insulin aspart  0-20 Units Subcutaneous TID WC  . insulin aspart  0-5 Units Subcutaneous QHS  . insulin aspart  3 Units Subcutaneous TID WC  . insulin detemir  26 Units Subcutaneous QHS  . metoprolol tartrate  25 mg Oral BID  . senna  1 tablet Oral BID  . vitamin B-12  100 mcg Oral QODAY  . vitamin C  500 mg Oral Daily  . zinc sulfate  220 mg Oral Daily   Continuous Infusions: . remdesivir 100 mg in NS 100 mL 100 mg (03/16/19 0922)   PRN Meds:.acetaminophen, chlorpheniramine-HYDROcodone,  guaiFENesin-dextromethorphan, ondansetron **OR** ondansetron (ZOFRAN) IV  Micro Results No results found for this or any previous visit (from the past 240 hour(s)).  Radiology Reports Dg Chest Port 1 View  Result Date: 03/16/2019 CLINICAL DATA:  Hypoxia in a COVID-19 positive patient. EXAM: PORTABLE CHEST 1 VIEW COMPARISON:  Single-view of the chest 03/14/2019. FINDINGS: Patchy left basilar airspace disease has worsened. Mild airspace disease in the medial aspect of the right lung base is unchanged. Lung volumes are low. No pneumothorax or pleural fluid. The patient is status post aortic valve replacement and clipping of the atrial appendage. Aortic atherosclerosis noted. IMPRESSION: Worsened PICC patchy left basilar CLINICAL DATA:  Hypoxia in a COVID-19 positive patient. EXAM: PORTABLE CHEST 1 VIEW COMPARISON:  Single-view of the chest 03/14/2019. FINDINGS: Patchy left basilar airspace disease has worsened. Mild airspace disease in the medial aspect of the right lung base is unchanged. Lung volumes are low. No pneumothorax or pleural fluid. The patient is status post aortic valve replacement and clipping of the atrial appendage. Aortic atherosclerosis noted. IMPRESSION: Worsened airspace disease in the left lung base worrisome for pneumonia. Electronically Signed   By: Drusilla Kanner M.D.   On: 03/16/2019 08:56   Dg Chest Port 1  View  Result Date: 03/14/2019 CLINICAL DATA:  Shortness of breath, COVID-19 EXAM: PORTABLE CHEST 1 VIEW COMPARISON:  Portable exam 1422 hours compared to 11/17/2016 FINDINGS: Upper normal size of cardiac silhouette post AVR and LEFT atrial appendage clipping. Mediastinal contours and pulmonary vascularity normal. Decreased lung volumes with mild infiltrates at the mid to lower lungs consistent with pneumonia. Upper lungs clear. No pleural effusion or pneumothorax. Lytic focus at proximal LEFT humerus versus superimposed loose body not seen on prior exam. Bones appear  demineralized. IMPRESSION: Bibasilar infiltrates consistent with pneumonia. Electronically Signed   By: Ulyses Southward M.D.   On: 03/14/2019 14:36      Huey Bienenstock M.D on 03/16/2019 at 4:17 PM  Between 7am to 7pm - Pager - [phone]  After 7pm go to [email or web address] - password [facility]  [facility] -  Office  [phone]

## 2019-03-16 NOTE — Progress Notes (Signed)
Results for JESSTIN, STUDSTILL (MRN [id number]) as of 03/16/2019 11:49  Ref. Range 03/15/2019 07:19 03/15/2019 11:35 03/15/2019 20:52 03/16/2019 09:13 03/16/2019 11:25  Glucose-Capillary Latest Ref Range: 70 - 99 mg/dL 157 (H) 362 (H) 237 (H) 240 (H) 317 (H)  Noted that CBGs continue to be greater than 180 mg/dl.  Recommend increasing Levemir dose (by 20%) to 26 units at HS, increase Novolog correction scale to RESISTANT TID and HS scale, and continue Novolog 3 units TID meal coverage. Titrate dosages as needed.   Harvel Ricks RN BSN CDE Diabetes Coordinator Pager: [phone]  8am-5pm

## 2019-03-17 DIAGNOSIS — U071 COVID-19: Secondary | ICD-10-CM | POA: Diagnosis not present

## 2019-03-17 DIAGNOSIS — I63432 Cerebral infarction due to embolism of left posterior cerebral artery: Secondary | ICD-10-CM | POA: Diagnosis not present

## 2019-03-17 DIAGNOSIS — I10 Essential (primary) hypertension: Secondary | ICD-10-CM | POA: Diagnosis not present

## 2019-03-17 LAB — CBC WITH DIFFERENTIAL/PLATELET
Abs Immature Granulocytes: 0.02 10*3/uL (ref 0.00–0.07)
Basophils Absolute: 0 10*3/uL (ref 0.0–0.1)
Basophils Relative: 0 %
Eosinophils Absolute: 0 10*3/uL (ref 0.0–0.5)
Eosinophils Relative: 0 %
HCT: 42.7 % (ref 39.0–52.0)
Hemoglobin: 13.6 g/dL (ref 13.0–17.0)
Immature Granulocytes: 0 %
Lymphocytes Relative: 30 %
Lymphs Abs: 1.6 10*3/uL (ref 0.7–4.0)
MCH: 25.5 pg — ABNORMAL LOW (ref 26.0–34.0)
MCHC: 31.9 g/dL (ref 30.0–36.0)
MCV: 80.1 fL (ref 80.0–100.0)
Monocytes Absolute: 0.2 10*3/uL (ref 0.1–1.0)
Monocytes Relative: 4 %
Neutro Abs: 3.5 10*3/uL (ref 1.7–7.7)
Neutrophils Relative %: 66 %
Platelets: 212 10*3/uL (ref 150–400)
RBC: 5.33 MIL/uL (ref 4.22–5.81)
RDW: 14.3 % (ref 11.5–15.5)
WBC: 5.4 10*3/uL (ref 4.0–10.5)
nRBC: 0 % (ref 0.0–0.2)

## 2019-03-17 LAB — COMPREHENSIVE METABOLIC PANEL
ALT: 26 U/L (ref 0–44)
AST: 21 U/L (ref 15–41)
Albumin: 3.5 g/dL (ref 3.5–5.0)
Alkaline Phosphatase: 53 U/L (ref 38–126)
Anion gap: 11 (ref 5–15)
BUN: 42 mg/dL — ABNORMAL HIGH (ref 8–23)
CO2: 23 mmol/L (ref 22–32)
Calcium: 8.9 mg/dL (ref 8.9–10.3)
Chloride: 104 mmol/L (ref 98–111)
Creatinine, Ser: 0.77 mg/dL (ref 0.61–1.24)
GFR calc Af Amer: >60 mL/min (ref >60)
GFR calc non Af Amer: >60 mL/min (ref >60)
Glucose, Bld: 236 mg/dL — ABNORMAL HIGH (ref 70–99)
Potassium: 4.6 mmol/L (ref 3.5–5.1)
Sodium: 138 mmol/L (ref 135–145)
Total Bilirubin: 0.6 mg/dL (ref 0.3–1.2)
Total Protein: 6.5 g/dL (ref 6.5–8.1)

## 2019-03-17 LAB — GLUCOSE, CAPILLARY
Glucose-Capillary: 190 mg/dL — ABNORMAL HIGH (ref 70–99)
Glucose-Capillary: 272 mg/dL — ABNORMAL HIGH (ref 70–99)
Glucose-Capillary: 233 mg/dL — ABNORMAL HIGH (ref 70–99)
Glucose-Capillary: 248 mg/dL — ABNORMAL HIGH (ref 70–99)

## 2019-03-17 LAB — D-DIMER, QUANTITATIVE: D-Dimer, Quant: 0.27 ug/mL-FEU (ref 0.00–0.50)

## 2019-03-17 LAB — FERRITIN: Ferritin: 557 ng/mL — ABNORMAL HIGH (ref 24–336)

## 2019-03-17 LAB — C-REACTIVE PROTEIN: CRP: 0.8 mg/dL (ref <1.0)

## 2019-03-17 MED ORDER — LOSARTAN POTASSIUM 50 MG PO TABS
50.0000 mg | ORAL_TABLET | Freq: Every day | ORAL | Status: DC
  Administered 2019-03-17: 50 mg via ORAL
  Filled 2019-03-17 (×2): qty 1

## 2019-03-17 NOTE — Progress Notes (Signed)
Brady cardia, especially when sleeping : history of sleep apnea, per protocol will not use cpap this admission due to covid. Placed pt on 2 liters: contacted [facility]

## 2019-03-17 NOTE — Progress Notes (Signed)
PROGRESS NOTE                                                                                                                                                                                                             Patient Demographics:    Vernon Ruiz, is a 68 y.o. male, DOB - 1950/09/12, GKK:[id number]  Admit date - 03/14/2019   Admitting Physician Dewayne Shorter Levora Dredge, MD  Outpatient Primary MD for the patient is [name], MD  LOS - 3   No chief complaint on file.      Brief Narrative    68 y.o. male, with medical history significant ofHTN, CAD, A. fib on Eliquis, aortic stenosis s/pvalve replacement, DM type II, right foot drop, and OSA, with recent hospitalization last September for mild CVA, with no residual deficits patient presents to [facility] for dyspnea, chest x-ray significant for 19 for pneumonia, he was admitted for further management.   Subjective:    Samaj Doehring today reports he tolerated ambulating in the hallway yesterday with staff, had no oxygen requirement .   Assessment  & Plan :    Active Problems:   Paroxysmal atrial fibrillation (HCC)   OSA (obstructive sleep apnea)   S/P AVR   HTN (hypertension)   CVA (cerebral vascular accident) (HCC)   COVID-19  COVID-19 for pneumonia -Patient presents with dyspnea, cough, chest x-ray significant for multifocal opacity, patient with known history of hypertension, diabetes, hyperlipidemia, CVA, he is a significantly increased risk for developing acute respiratory failure/ARDS. -Patient was encouraged use incentive spirometry, flutter valve, get out of bed to chair and to ambulate with staff given worsening atelectasis on repeat chest x-ray 12/4. -Continue with Decadron -Continue with remdesivir, monitor LFTs closely -Patient currently does not want to get convalescent plasma, as he reports he is already feeling better    COVID-19 Labs  Recent Labs   03/15/19 0018 03/16/19 0030 03/17/19 0104  DDIMER 0.40 0.39 0.27  FERRITIN 711* 548* 557*  CRP 1.7* 1.4* 0.8    Lab Results  Component Value Date   SARSCOV2NAA NEGATIVE 12/12/2018    History of CVA - continue with statin and Eliquis.  History of paroxysmal A. Fib. -Continue with Eliquis. -Currently normal sinus rhythm, continue with metoprolol.  Diabetes mellitus type 2, insulin-dependent -CBG uncontrolled, will increase Levemir to 26 units  and change sliding scale to resistant, and continue with 3 units NovoLog before meals . -We will hold Metformin, Jardiance and Trulicity, will continue with Amaryl meanwhile  OSA -No CPAP per COVID-19 protocol during hospital stay  Hypertension -Initial blood pressure is soft, now started to increase, I will resume on losartan half dose 100>50 mg , continue to hold amlodipine, his metoprolol has been resumed at half dose 50 > 25 given bradycardia with heart rate in the 50s .  Sinus bradycardia  -At rate in the 50s, no heart block, asymptomatic, continue with metoprolol at a lower dose 25 mg p.o. twice daily specially with known history of A. fib .  History of aortic stenosis status post AVR -Stable clinically   Code Status : Full  Family Communication  : D/W wife via phone 12/4  Disposition Plan  : Home  Consults  :  None  Procedures  : none  DVT Prophylaxis  :  On Eliquis for A Fib  Lab Results  Component Value Date   PLT 212 03/17/2019    Antibiotics  :    Anti-infectives (From admission, onward)   Start     Dose/Rate Route Frequency Ordered Stop   03/17/19 1000  remdesivir 100 mg in sodium chloride 0.9 % 100 mL IVPB     100 mg 200 mL/hr over 30 Minutes Intravenous Daily 03/16/19 2129 03/19/19 0959   03/15/19 1600  remdesivir 100 mg in sodium chloride 0.9 % 250 mL IVPB  Status:  Discontinued     100 mg 500 mL/hr over 30 Minutes Intravenous Daily 03/14/19 1659 03/16/19 2128   03/15/19 1430  remdesivir 100 mg  in sodium chloride 0.9 % 250 mL IVPB  Status:  Discontinued     100 mg 500 mL/hr over 30 Minutes Intravenous Every 24 hours 03/14/19 1405 03/14/19 1407   03/14/19 1800  remdesivir 200 mg in sodium chloride 0.9 % 250 mL IVPB     200 mg 500 mL/hr over 30 Minutes Intravenous Once 03/14/19 1659 03/14/19 1855   03/14/19 1430  remdesivir 200 mg in sodium chloride 0.9 % 250 mL IVPB  Status:  Discontinued     200 mg 500 mL/hr over 30 Minutes Intravenous Once 03/14/19 1405 03/14/19 1407        Objective:   Vitals:   03/17/19 0429 03/17/19 0911 03/17/19 1130 03/17/19 1135  BP: 120/67 122/72 (!) 149/108   Pulse: (!) 51 61 63 (!) 57  Resp: 16 16 18 15   Temp: (!) 97.5 F (36.4 C) (!) 97.2 F (36.2 C) 98 F (36.7 C)   TempSrc: Oral Oral Oral   SpO2: 95% 94% 95% 95%  Weight:      Height:        Wt Readings from Last 3 Encounters:  03/14/19 88 kg  12/26/18 93.8 kg  12/12/18 84.4 kg     Intake/Output Summary (Last 24 hours) at 03/17/2019 1544 Last data filed at 03/17/2019 1200 Gross per 24 hour  Intake 840 ml  Output 900 ml  Net -60 ml     Physical Exam  Awake Alert, Oriented X 3, No new F.N deficits, Normal affect Symmetrical Chest wall movement, diminished air entry at the bases, no wheezing RRR,No Gallops,Rubs or new Murmurs, No Parasternal Heave +ve B.Sounds, Abd Soft, No tenderness, No rebound - guarding or rigidity. No Cyanosis, Clubbing or edema, No new Rash or bruise       Data Review:    CBC Recent Labs  Lab  03/14/19 1445 03/15/19 0018 03/16/19 0030 03/17/19 0104  WBC 7.4 4.5 4.2 5.4  HGB 15.8 14.4 13.8 13.6  HCT 49.0 45.6 43.1 42.7  PLT 207 182 188 212  MCV 81.5 81.4 80.4 80.1  MCH 26.3 25.7* 25.7* 25.5*  MCHC 32.2 31.6 32.0 31.9  RDW 14.7 14.7 14.3 14.3  LYMPHSABS 2.8 1.3 1.4 1.6  MONOABS 0.6 0.2 0.1 0.2  EOSABS 0.0 0.0 0.0 0.0  BASOSABS 0.0 0.0 0.0 0.0    Chemistries  Recent Labs  Lab 03/14/19 1445 03/15/19 0018 03/16/19 0030 03/17/19 0104   NA 138 134* 135 138  K 4.6 4.7 4.7 4.6  CL 99 99 102 104  CO2 26 22 21* 23  GLUCOSE 162* 202* 330* 236*  BUN 24* 24* 38* 42*  CREATININE 1.06 0.84 0.93 0.77  CALCIUM 9.3 8.9 8.8* 8.9  AST 27 25 24 21   ALT 28 26 25 26   ALKPHOS 62 59 55 53  BILITOT 0.7 0.7 0.6 0.6   ------------------------------------------------------------------------------------------------------------------ No results for input(s): CHOL, HDL, LDLCALC, TRIG, CHOLHDL, LDLDIRECT in the last 72 hours.  Lab Results  Component Value Date   HGBA1C 7.9 (H) 03/15/2019   ------------------------------------------------------------------------------------------------------------------ No results for input(s): TSH, T4TOTAL, T3FREE, THYROIDAB in the last 72 hours.  Invalid input(s): FREET3 ------------------------------------------------------------------------------------------------------------------ Recent Labs    03/16/19 0030 03/17/19 0104  FERRITIN 548* 557*    Coagulation profile No results for input(s): INR, PROTIME in the last 168 hours.  Recent Labs    03/16/19 0030 03/17/19 0104  DDIMER 0.39 0.27    Cardiac Enzymes No results for input(s): CKMB, TROPONINI, MYOGLOBIN in the last 168 hours.  Invalid input(s): CK ------------------------------------------------------------------------------------------------------------------    Component Value Date/Time   BNP 24.8 03/14/2019 1445    Inpatient Medications  Scheduled Meds: . sodium chloride   Intravenous Once  . apixaban  5 mg Oral BID  . atorvastatin  20 mg Oral q1800  . dexamethasone (DECADRON) injection  6 mg Intravenous Q24H  . glimepiride  4 mg Oral BID  . insulin aspart  0-20 Units Subcutaneous TID WC  . insulin aspart  0-5 Units Subcutaneous QHS  . insulin aspart  3 Units Subcutaneous TID WC  . insulin detemir  26 Units Subcutaneous QHS  . metoprolol tartrate  25 mg Oral BID  . senna  1 tablet Oral BID  . vitamin B-12  100 mcg  Oral QODAY  . vitamin C  500 mg Oral Daily  . zinc sulfate  220 mg Oral Daily   Continuous Infusions: . remdesivir 100 mg in NS 100 mL 100 mg (03/17/19 0907)   PRN Meds:.acetaminophen, chlorpheniramine-HYDROcodone, guaiFENesin-dextromethorphan, ondansetron **OR** ondansetron (ZOFRAN) IV  Micro Results No results found for this or any previous visit (from the past 240 hour(s)).  Radiology Reports Dg Chest Port 1 View  Result Date: 03/16/2019 CLINICAL DATA:  Hypoxia in a COVID-19 positive patient. EXAM: PORTABLE CHEST 1 VIEW COMPARISON:  Single-view of the chest 03/14/2019. FINDINGS: Patchy left basilar airspace disease has worsened. Mild airspace disease in the medial aspect of the right lung base is unchanged. Lung volumes are low. No pneumothorax or pleural fluid. The patient is status post aortic valve replacement and clipping of the atrial appendage. Aortic atherosclerosis noted. IMPRESSION: Worsened PICC patchy left basilar CLINICAL DATA:  Hypoxia in a COVID-19 positive patient. EXAM: PORTABLE CHEST 1 VIEW COMPARISON:  Single-view of the chest 03/14/2019. FINDINGS: Patchy left basilar airspace disease has worsened. Mild airspace disease in the medial aspect of the  right lung base is unchanged. Lung volumes are low. No pneumothorax or pleural fluid. The patient is status post aortic valve replacement and clipping of the atrial appendage. Aortic atherosclerosis noted. IMPRESSION: Worsened airspace disease in the left lung base worrisome for pneumonia. Electronically Signed   By: Drusilla Kannerhomas  Dalessio M.D.   On: 03/16/2019 08:56   Dg Chest Port 1 View  Result Date: 03/14/2019 CLINICAL DATA:  Shortness of breath, COVID-19 EXAM: PORTABLE CHEST 1 VIEW COMPARISON:  Portable exam 1422 hours compared to 11/17/2016 FINDINGS: Upper normal size of cardiac silhouette post AVR and LEFT atrial appendage clipping. Mediastinal contours and pulmonary vascularity normal. Decreased lung volumes with mild infiltrates  at the mid to lower lungs consistent with pneumonia. Upper lungs clear. No pleural effusion or pneumothorax. Lytic focus at proximal LEFT humerus versus superimposed loose body not seen on prior exam. Bones appear demineralized. IMPRESSION: Bibasilar infiltrates consistent with pneumonia. Electronically Signed   By: Ulyses SouthwardMark  Boles M.D.   On: 03/14/2019 14:36      Huey Bienenstockawood Croy Drumwright M.D on 03/17/2019 at 3:44 PM  Between 7am to 7pm - Pager - [phone]  After 7pm go to [email or web address] - password [email or web address]  [facility] -  Office  [phone]

## 2019-03-17 NOTE — Progress Notes (Signed)
Patient ambulating in hall on room air, sats >95% no distress

## 2019-03-18 DIAGNOSIS — I63432 Cerebral infarction due to embolism of left posterior cerebral artery: Secondary | ICD-10-CM | POA: Diagnosis not present

## 2019-03-18 DIAGNOSIS — I10 Essential (primary) hypertension: Secondary | ICD-10-CM | POA: Diagnosis not present

## 2019-03-18 DIAGNOSIS — U071 COVID-19: Secondary | ICD-10-CM | POA: Diagnosis not present

## 2019-03-18 LAB — CBC WITH DIFFERENTIAL/PLATELET
Abs Immature Granulocytes: 0.03 10*3/uL (ref 0.00–0.07)
Basophils Absolute: 0 10*3/uL (ref 0.0–0.1)
Basophils Relative: 0 %
Eosinophils Absolute: 0 10*3/uL (ref 0.0–0.5)
Eosinophils Relative: 0 %
HCT: 42 % (ref 39.0–52.0)
Hemoglobin: 13.6 g/dL (ref 13.0–17.0)
Immature Granulocytes: 0 %
Lymphocytes Relative: 26 %
Lymphs Abs: 1.9 10*3/uL (ref 0.7–4.0)
MCH: 25.9 pg — ABNORMAL LOW (ref 26.0–34.0)
MCHC: 32.4 g/dL (ref 30.0–36.0)
MCV: 80 fL (ref 80.0–100.0)
Monocytes Absolute: 0.4 10*3/uL (ref 0.1–1.0)
Monocytes Relative: 6 %
Neutro Abs: 4.9 10*3/uL (ref 1.7–7.7)
Neutrophils Relative %: 68 %
Platelets: 228 10*3/uL (ref 150–400)
RBC: 5.25 MIL/uL (ref 4.22–5.81)
RDW: 14.3 % (ref 11.5–15.5)
WBC: 7.3 10*3/uL (ref 4.0–10.5)
nRBC: 0 % (ref 0.0–0.2)

## 2019-03-18 LAB — FERRITIN: Ferritin: 514 ng/mL — ABNORMAL HIGH (ref 24–336)

## 2019-03-18 LAB — COMPREHENSIVE METABOLIC PANEL
ALT: 27 U/L (ref 0–44)
AST: 21 U/L (ref 15–41)
Albumin: 3.4 g/dL — ABNORMAL LOW (ref 3.5–5.0)
Alkaline Phosphatase: 49 U/L (ref 38–126)
Anion gap: 11 (ref 5–15)
BUN: 36 mg/dL — ABNORMAL HIGH (ref 8–23)
CO2: 28 mmol/L (ref 22–32)
Calcium: 8.6 mg/dL — ABNORMAL LOW (ref 8.9–10.3)
Chloride: 99 mmol/L (ref 98–111)
Creatinine, Ser: 0.84 mg/dL (ref 0.61–1.24)
GFR calc Af Amer: >60 mL/min (ref >60)
GFR calc non Af Amer: >60 mL/min (ref >60)
Glucose, Bld: 224 mg/dL — ABNORMAL HIGH (ref 70–99)
Potassium: 4.9 mmol/L (ref 3.5–5.1)
Sodium: 138 mmol/L (ref 135–145)
Total Bilirubin: 0.8 mg/dL (ref 0.3–1.2)
Total Protein: 6.4 g/dL — ABNORMAL LOW (ref 6.5–8.1)

## 2019-03-18 LAB — GLUCOSE, CAPILLARY
Glucose-Capillary: 168 mg/dL — ABNORMAL HIGH (ref 70–99)
Glucose-Capillary: 161 mg/dL — ABNORMAL HIGH (ref 70–99)

## 2019-03-18 LAB — C-REACTIVE PROTEIN: CRP: 0.6 mg/dL (ref <1.0)

## 2019-03-18 LAB — D-DIMER, QUANTITATIVE: D-Dimer, Quant: 0.29 ug/mL-FEU (ref 0.00–0.50)

## 2019-03-18 MED ORDER — METOPROLOL TARTRATE 25 MG PO TABS: 25.0000 mg | ORAL_TABLET | Freq: Two times a day (BID) | ORAL | 0 refills | Status: DC

## 2019-03-18 MED ORDER — FAMOTIDINE 20 MG PO TABS: 20.0000 mg | ORAL_TABLET | Freq: Two times a day (BID) | ORAL | 0 refills | Status: AC

## 2019-03-18 MED ORDER — AMLODIPINE BESYLATE 5 MG PO TABS
2.5000 mg | ORAL_TABLET | Freq: Every day | ORAL | Status: DC
  Administered 2019-03-18: 2.5 mg via ORAL
  Filled 2019-03-18: qty 1

## 2019-03-18 MED ORDER — DEXAMETHASONE 6 MG PO TABS: 6.0000 mg | ORAL_TABLET | Freq: Every day | ORAL | 0 refills | Status: AC

## 2019-03-18 NOTE — Care Management (Signed)
Pt to discharge home today via private vehicle.  CM spoke with pt via phone.  Pt confirms that PTA he was completely independent from home.  Pt has PCP and will make follow up appt - pt will need to inform office that he is covid positive.  Pt has thermometer in the home.  Pt denied barriers with discharging back to the home - pts wife will transport pt home.  No CM needs determined-  CM signing off

## 2019-03-18 NOTE — Discharge Summary (Signed)
Vernon Ruiz, is a 68 y.o. male  DOB 07/15/1950  MRN [id number].  Admission date:  03/14/2019  Admitting Physician  Maretta BeesShanker M Ghimire, MD  Discharge Date:  03/18/2019   Primary MD  Catha GosselinLittle, Kevin, MD  Recommendations for primary care physician for things to follow:  -Please check CBC, CMP during next visit. -Antihypertensive medicine regimen has been adjusted, be discharged metoprolol 25 mg p.o. twice daily, amlodipine has been stopped, losartan has been stopped. -Continue counseling about dietary and lifestyle modification for blood pressure control as it appears to be controlled during hospital stay with only metoprolol. -Consider resuming lower dose losartan if potassium is stable during next visit.    Admission Diagnosis  COVID    Discharge Diagnosis  COVID     Active Problems:   Paroxysmal atrial fibrillation (HCC)   OSA (obstructive sleep apnea)   S/P AVR   HTN (hypertension)   CVA (cerebral vascular accident) (HCC)   COVID-19      Past Medical History:  Diagnosis Date  . Aortic stenosis    s/p AVR using a 23mm Edwards Magna-Ease pericardial valve 10/04/16  . Coronary artery disease   . Diabetes mellitus without complication (HCC)   . GERD (gastroesophageal reflux disease)   . Heart murmur   . Hypertension   . Obstructive sleep apnea    positive from a home test, still needs to get another study done  . Persistent atrial fibrillation (HCC)    s/p clipping of LA appendage 10/04/16  . Right carotid bruit 12/12/2018  . Right foot drop 11/07/2018    Past Surgical History:  Procedure Laterality Date  . AORTIC VALVE REPLACEMENT N/A 10/04/2016   Procedure: AORTIC VALVE REPLACEMENT (AVR);  Surgeon: Alleen BorneBartle, Bryan K, MD;  [facility];  Service: Open Heart Surgery;  Laterality: N/A;  . APPENDECTOMY    . CARDIAC CATHETERIZATION N/A 03/10/2016   Procedure: Right/Left Heart Cath and Coronary  Angiography;  Surgeon: Tonny BollmanMichael Cooper, MD;  [facility];  Service: Cardiovascular;  Laterality: N/A;  . CLIPPING OF ATRIAL APPENDAGE N/A 10/04/2016   Procedure: CLIPPING OF ATRIAL APPENDAGE;  Surgeon: Alleen BorneBartle, Bryan K, MD;  Location: [facility] OR;  Service: Open Heart Surgery;  Laterality: N/A;  . COLONOSCOPY    . EYE SURGERY Right    growth removed from eye  . HERNIA REPAIR Bilateral    inguinal  . TEE WITHOUT CARDIOVERSION N/A 10/04/2016   Procedure: TRANSESOPHAGEAL ECHOCARDIOGRAM (TEE);  Surgeon: Alleen BorneBartle, Bryan K, MD;  [facility];  Service: Open Heart Surgery;  Laterality: N/A;       History of present illness and  Hospital Course:     Kindly see H&P for history of present illness and admission details, please review complete Labs, Consult reports and Test reports for all details in brief  HPI  from the history and physical done on the day of admission 03/14/2019  Vernon Ruiz  is a 68 y.o. male, with medical history significant ofHTN, CAD, A. fib on Eliquis, aortic  stenosis s/pvalve replacement, DM type II, right foot drop, and OSA, with recent hospitalization last September for mild CVA, with no residual deficits patient presents to [facility] for dyspnea, patient was diagnosed with COVID-19 11/27, multiple family members at his house were diagnosed including his wife and grandchildren, patient report symptoms started a week ago, reports initially some fever and chills, intermittent, this resolved, reports nausea, but no vomiting, poor appetite and decreased oral intake, he develops dyspnea initially exertional, then even at rest, as well reports cough, nonproductive, he denies any chest pain, but reports generalized body ache, patient was referred for direct admission by PCP given multiple risk factors including his stroke, diabetes, hypertension, chest x-ray on admission showing COVID-19 for pneumonia, patient was started on steroids, remdesivir, and convulsant  plasma.   Hospital Course   COVID-19 for pneumonia -Patient presents with dyspnea, cough, chest x-ray significant for multifocal opacity, patient with known history of hypertension, diabetes, hyperlipidemia, CVA, he is a significantly increased risk for developing acute respiratory failure/ARDS so he was admitted for treatment, patient was kept on Decadron during hospital stay, he did finish total of 5 days of remdesivir, he was encouraged to continue using incentive spirometry and flutter valve upon discharge, he will be discharged on another 5 days of Decadron for total of 10 days treatment. -Is able to ambulate in the hallway, with no dyspnea or hypoxia. -Telemetry markers has normalized which is reassuring.  COVID-19 Labs  Recent Labs    03/16/19 0030 03/17/19 0104 03/18/19 0410  DDIMER 0.39 0.27 0.29  FERRITIN 548* 557* 514*  CRP 1.4* 0.8 0.6    History of CVA - continue with statin and Eliquis.  History of paroxysmal A. Fib. -Continue with Eliquis. -Currently normal sinus rhythm, continue with metoprolol, I have lowered his dose to 25 mg p.o. twice daily during hospital stay, with overall good heart rate control, actually patient heart rate on the lower side, 40s when sleeping, 50s when awake, but he is asymptomatic(by reviewing old records appears to be his baseline heart rate 50s to 60s)  Diabetes mellitus type 2, insulin-dependent -Resume home regimen on discharge  OSA -No CPAP per COVID-19 protocol during hospital stay  Hypertension -Overall blood pressure during hospital stay has been well controlled to soft, he has only couple of uncontrolled readings during his stay, he was kept on metoprolol 25 mg p.o. twice daily, with good blood pressure control(dose was lowered from 50 to 25 mg p.o. twice daily metoprolol given some bradycardia), losartan has been held and stopped on discharge during potassium 4.9 on discharge, can consider resuming low-dose losartan given  he is diabetic if potassium level is acceptable on repeat labs, amlodipine has been stopped.  Sinus bradycardia  -At rate in the 50s, no heart block, asymptomatic, continue with metoprolol at a lower dose 25 mg p.o. twice daily specially with known history of A. fib .  History of aortic stenosis status post AVR -Is following with Dr. Excell Seltzer as an outpatient      Discharge Condition:  stable   Follow UP  Follow-up Information    Little, Caryn Bee, MD Follow up in 2 week(s).   Specialty: Family Medicine Why: please inform PCP about COVID 19 + status,  Contact information: [address] [place] [address] [phone]             Discharge Instructions  and  Discharge Medications     Discharge Instructions    Discharge instructions   Complete by:  As directed    Follow with Primary MD [name], MD in 14 days   Get CBC, CMP,  checked  by Primary MD next visit.    Activity: As tolerated with Full fall precautions use walker/cane & assistance as needed   Disposition Home    Diet: Heart Healthy /low salt/Carb modified.  For Heart failure patients - Check your Weight same time everyday, if you gain over 2 pounds, or you develop in leg swelling, experience more shortness of breath or chest pain, call your Primary MD immediately. Follow Cardiac Low Salt Diet and 1.5 lit/day fluid restriction.   On your next visit with your primary care physician please Get Medicines reviewed and adjusted.   Please request your Prim.MD to go over all Hospital Tests and Procedure/Radiological results at the follow up, please get all Hospital records sent to your Prim MD by signing hospital release before you go home.   If you experience worsening of your admission symptoms, develop shortness of breath, life threatening emergency, suicidal or homicidal thoughts you must seek medical attention immediately by calling 911 or calling your MD immediately  if symptoms less  severe.  You Must read complete instructions/literature along with all the possible adverse reactions/side effects for all the Medicines you take and that have been prescribed to you. Take any new Medicines after you have completely understood and accpet all the possible adverse reactions/side effects.   Do not drive, operating heavy machinery, perform activities at heights, swimming or participation in water activities or provide baby sitting services if your were admitted for syncope or siezures until you have seen by Primary MD or a Neurologist and advised to do so again.  Do not drive when taking Pain medications.    Do not take more than prescribed Pain, Sleep and Anxiety Medications  Special Instructions: If you have smoked or chewed Tobacco  in the last 2 yrs please stop smoking, stop any regular Alcohol  and or any Recreational drug use.  Wear Seat belts while driving.   Please note  You were cared for by a hospitalist during your hospital stay. If you have any questions about your discharge medications or the care you received while you were in the hospital after you are discharged, you can call the unit and asked to speak with the hospitalist on call if the hospitalist that took care of you is not available. Once you are discharged, your primary care physician will handle any further medical issues. Please note that NO REFILLS for any discharge medications will be authorized once you are discharged, as it is imperative that you return to your primary care physician (or establish a relationship with a primary care physician if you do not have one) for your aftercare needs so that they can reassess your need for medications and monitor your lab values.   Increase activity slowly   Complete by: As directed      Allergies as of 03/18/2019      Reactions   Penicillins Itching, Rash   All over body Has patient had a PCN reaction causing immediate rash, facial/tongue/throat swelling, SOB  or lightheadedness with hypotension: yes Has patient had a PCN reaction causing severe rash involving mucus membranes or skin necrosis: no Has patient had a PCN reaction that required hospitalization no Has patient had a PCN reaction occurring within the last 10 years: no If all of the above answers are "NO", then may proceed with Cephalosporin use.   Sulfa Antibiotics Rash  Rash   Sulfamethoxazole Rash      Medication List    STOP taking these medications   amLODipine 2.5 MG tablet Commonly known as: NORVASC   enoxaparin 150 MG/ML injection Commonly known as: Lovenox   ibuprofen 200 MG tablet Commonly known as: ADVIL   losartan 100 MG tablet Commonly known as: COZAAR     TAKE these medications   acetaminophen 325 MG tablet Commonly known as: TYLENOL Take 2 tablets (650 mg total) by mouth every 6 (six) hours as needed for mild pain.   atorvastatin 20 MG tablet Commonly known as: LIPITOR Take 1 tablet (20 mg total) by mouth daily at 6 PM.   dexamethasone 6 MG tablet Commonly known as: DECADRON Take 1 tablet (6 mg total) by mouth daily for 5 days. Start taking on: March 19, 2019   Eliquis 5 MG Tabs tablet Generic drug: apixaban Take 5 mg by mouth 2 (two) times daily.   famotidine 20 MG tablet Commonly known as: PEPCID Take 1 tablet (20 mg total) by mouth 2 (two) times daily for 15 days.   Fish Oil 1000 MG Cpdr Take 1,000 mg by mouth daily.   glimepiride 4 MG tablet Commonly known as: AMARYL Take 4 mg by mouth 2 (two) times daily.   Jardiance 25 MG Tabs tablet Generic drug: empagliflozin Take 12.5 mg by mouth daily.   metFORMIN 1000 MG tablet Commonly known as: GLUCOPHAGE Take 1,000 mg by mouth 2 (two) times daily.   metoprolol tartrate 25 MG tablet Commonly known as: LOPRESSOR Take 1 tablet (25 mg total) by mouth 2 (two) times daily. What changed:   medication strength  how much to take   Toujeo Max SoloStar 300 UNIT/ML Sopn Generic drug:  Insulin Glargine (2 Unit Dial) Inject 40 Units into the skin daily. What changed: Another medication with the same name was removed. Continue taking this medication, and follow the directions you see here.   Trulicity 1.5 MG/0.5ML Sopn Generic drug: Dulaglutide Inject 1.5 mg into the skin once a week. Sunday   VITAMIN B 12 PO Take 100 mg by mouth every other day.   vitamin C 500 MG tablet Commonly known as: ASCORBIC ACID Take 500 mg by mouth 2 (two) times daily.   zinc gluconate 50 MG tablet Take 50 mg by mouth daily.         Diet and Activity recommendation: See Discharge Instructions above   Consults obtained -  None   Major procedures and Radiology Reports - PLEASE review detailed and final reports for all details, in brief -     Dg Chest Port 1 View  Result Date: 03/16/2019 CLINICAL DATA:  Hypoxia in a COVID-19 positive patient. EXAM: PORTABLE CHEST 1 VIEW COMPARISON:  Single-view of the chest 03/14/2019. FINDINGS: Patchy left basilar airspace disease has worsened. Mild airspace disease in the medial aspect of the right lung base is unchanged. Lung volumes are low. No pneumothorax or pleural fluid. The patient is status post aortic valve replacement and clipping of the atrial appendage. Aortic atherosclerosis noted. IMPRESSION: Worsened PICC patchy left basilar CLINICAL DATA:  Hypoxia in a COVID-19 positive patient. EXAM: PORTABLE CHEST 1 VIEW COMPARISON:  Single-view of the chest 03/14/2019. FINDINGS: Patchy left basilar airspace disease has worsened. Mild airspace disease in the medial aspect of the right lung base is unchanged. Lung volumes are low. No pneumothorax or pleural fluid. The patient is status post aortic valve replacement and clipping of the atrial appendage. Aortic atherosclerosis noted. IMPRESSION:  Worsened airspace disease in the left lung base worrisome for pneumonia. Electronically Signed   By: Inge Rise M.D.   On: 03/16/2019 08:56   Dg Chest Port  1 View  Result Date: 03/14/2019 CLINICAL DATA:  Shortness of breath, COVID-19 EXAM: PORTABLE CHEST 1 VIEW COMPARISON:  Portable exam 1422 hours compared to 11/17/2016 FINDINGS: Upper normal size of cardiac silhouette post AVR and LEFT atrial appendage clipping. Mediastinal contours and pulmonary vascularity normal. Decreased lung volumes with mild infiltrates at the mid to lower lungs consistent with pneumonia. Upper lungs clear. No pleural effusion or pneumothorax. Lytic focus at proximal LEFT humerus versus superimposed loose body not seen on prior exam. Bones appear demineralized. IMPRESSION: Bibasilar infiltrates consistent with pneumonia. Electronically Signed   By: Lavonia Dana M.D.   On: 03/14/2019 14:36    Micro Results     No results found for this or any previous visit (from the past 240 hour(s)).     Today   Subjective:   Vernon Ruiz today has no headache,no chest abdominal pain,no new weakness tingling or numbness, feels much better wants to go home today.  Denies any dyspnea, ambulating in the hallway with no hypoxia or dyspnea.  Objective:   Blood pressure 135/85, pulse (!) 56, temperature 97.8 F (36.6 C), temperature source Oral, resp. rate 13, height 5\' 5"  (1.651 m), weight 88 kg, SpO2 96 %.   Intake/Output Summary (Last 24 hours) at 03/18/2019 1157 Last data filed at 03/18/2019 0901 Gross per 24 hour  Intake 1420 ml  Output 900 ml  Net 520 ml    Exam Awake Alert, Oriented x 3, No new F.N deficits, Normal affect Symmetrical Chest wall movement, Good air movement bilaterally, CTAB RRR,No Gallops,Rubs or new Murmurs, No Parasternal Heave +ve B.Sounds, Abd Soft, Non tender, No rebound -guarding or rigidity. No Cyanosis, Clubbing or edema, No new Rash or bruise  Data Review   CBC w Diff:  Lab Results  Component Value Date   WBC 7.3 03/18/2019   HGB 13.6 03/18/2019   HCT 42.0 03/18/2019   PLT 228 03/18/2019   LYMPHOPCT 26 03/18/2019   MONOPCT 6 03/18/2019    EOSPCT 0 03/18/2019   BASOPCT 0 03/18/2019    CMP:  Lab Results  Component Value Date   NA 138 03/18/2019   NA 139 12/12/2018   K 4.9 03/18/2019   CL 99 03/18/2019   CO2 28 03/18/2019   BUN 36 (H) 03/18/2019   BUN 21 12/12/2018   CREATININE 0.84 03/18/2019   PROT 6.4 (L) 03/18/2019   PROT 6.6 12/12/2018   ALBUMIN 3.4 (L) 03/18/2019   ALBUMIN 4.5 12/12/2018   BILITOT 0.8 03/18/2019   BILITOT 0.3 12/12/2018   ALKPHOS 49 03/18/2019   AST 21 03/18/2019   ALT 27 03/18/2019  .   Total Time in preparing paper work, data evaluation and todays exam - 31 minutes  Phillips Climes M.D on 03/18/2019 at 11:57 AM  [facility]   Office  [phone]

## 2019-03-18 NOTE — Progress Notes (Signed)
Notified MD of SB 43-50s during night and this a.m.

## 2019-03-21 ENCOUNTER — Ambulatory Visit: Payer: Medicare Other | Admitting: Neurology

## 2019-03-27 ENCOUNTER — Ambulatory Visit: Payer: Medicare Other

## 2019-03-29 ENCOUNTER — Telehealth: Payer: Self-pay | Admitting: Cardiovascular Disease

## 2019-03-29 ENCOUNTER — Encounter: Payer: Self-pay | Admitting: Cardiovascular Disease

## 2019-03-29 ENCOUNTER — Telehealth (INDEPENDENT_AMBULATORY_CARE_PROVIDER_SITE_OTHER): Payer: Medicare Other | Admitting: Cardiovascular Disease

## 2019-03-29 ENCOUNTER — Other Ambulatory Visit: Payer: Self-pay

## 2019-03-29 VITALS — BP 139/75 | HR 82 | Ht 65.0 in | Wt 194.0 lb

## 2019-03-29 DIAGNOSIS — I359 Nonrheumatic aortic valve disorder, unspecified: Secondary | ICD-10-CM | POA: Diagnosis not present

## 2019-03-29 DIAGNOSIS — I480 Paroxysmal atrial fibrillation: Secondary | ICD-10-CM

## 2019-03-29 DIAGNOSIS — I10 Essential (primary) hypertension: Secondary | ICD-10-CM

## 2019-03-29 DIAGNOSIS — I63432 Cerebral infarction due to embolism of left posterior cerebral artery: Secondary | ICD-10-CM

## 2019-03-29 NOTE — Telephone Encounter (Signed)
    Pt c/o BP issue: STAT if pt c/o blurred vision, one-sided weakness or slurred speech  1. What are your last 5 BP readings?  Mon 135/72  [id number] Wed 194/94  Wed 1 hr later 112/65 Today: 158/83   2. Are you having any other symptoms (ex. Dizziness, headache, blurred vision, passed out)? Blurry vision.   He states he feels like he is on is going to flip over and collapse, like on a ferris wheel or ride that goes upside down. He has to sit and hold onto the couch to make the feeling go away  3. What is your BP issue? Pt notices spikes in BP, but then his BP will go back down on it's own. He is worried about having another stroke. He would really like to see Dr. Burt Knack ASAP.  He was in the hospital for [place] in the beginning of December but has been home since 12/06.

## 2019-03-29 NOTE — Progress Notes (Signed)
Virtual Visit via Video Note   This visit type was conducted due to national recommendations for restrictions regarding the COVID-19 Pandemic (e.g. social distancing) in an effort to limit this patient's exposure and mitigate transmission in our community.  Due to his co-morbid illnesses, this patient is at least at moderate risk for complications without adequate follow up.  This format is felt to be most appropriate for this patient at this time.  All issues noted in this document were discussed and addressed.  A limited physical exam was performed with this format.  Please refer to the patient's chart for his consent to telehealth for [facility].   Date:  04/05/2019   ID:  Vernon Ruiz, DOB 05/29/1950, MRN [id number]  Patient Location: Home Provider Location: Office  PCP:  Hulan Fess, MD  Cardiologist:  Sherren Mocha, MD  Electrophysiologist:  None   Evaluation Performed:  Follow-Up Visit  Chief Complaint:  Labile blood pressure  History of Present Illness:    Vernon Ruiz is a 68 y.o. male with aortic valve disease and paroxysmal atrial fibrillation, presenting for follow-up evaluation.  The patient underwent bioprosthetic aortic valve replacement in 2018 with a 23 mm magna ease pericardial tissue valve.  His heart surgery was uncomplicated.  Comorbid medical conditions include type 2 diabetes, hypertension, and obstructive sleep apnea.  The patient does have symptoms concerning for COVID-19 infection (fever, chills, cough, or new shortness of breath).   The patient was recently hospitalized with Covid-19 pneumonia. He was treated with Decadron, remdesivir, and convalescent plasma. He was hospitalized 12/1-12/09/2018. I reviewed hospital records and he was noted to have bradycardia, treated with reduction in metoprolol to 25 mg BID. He complains of dizziness today, feels like the 'room is spinning.' he's been having this intermittently since he had Covid and this occurred once  in the hospital. He denies chest pain or pressure and denies dyspnea today. No change in chronic mild leg swelling. He's had some very high BP readings, but most recent BP and HR are both in good range (see vitals below).   Past Medical History:  Diagnosis Date  . Aortic stenosis    s/p AVR using a 43mm Edwards Magna-Ease pericardial valve 10/04/16  . Coronary artery disease   . Diabetes mellitus without complication ([place])   . GERD (gastroesophageal reflux disease)   . Heart murmur   . Hypertension   . Obstructive sleep apnea    positive from a home test, still needs to get another study done  . Persistent atrial fibrillation (HCC)    s/p clipping of LA appendage 10/04/16  . Right carotid bruit 12/12/2018  . Right foot drop 11/07/2018   Past Surgical History:  Procedure Laterality Date  . AORTIC VALVE REPLACEMENT N/A 10/04/2016   Procedure: AORTIC VALVE REPLACEMENT (AVR);  Surgeon: Gaye Pollack, MD;  Location: [place];  Service: Open Heart Surgery;  Laterality: N/A;  . APPENDECTOMY    . CARDIAC CATHETERIZATION N/A 03/10/2016   Procedure: Right/Left Heart Cath and Coronary Angiography;  Surgeon: Sherren Mocha, MD;  Location: [place] [facility];  Service: Cardiovascular;  Laterality: N/A;  . CLIPPING OF ATRIAL APPENDAGE N/A 10/04/2016   Procedure: CLIPPING OF ATRIAL APPENDAGE;  Surgeon: Gaye Pollack, MD;  Location: [place];  Service: Open Heart Surgery;  Laterality: N/A;  . COLONOSCOPY    . EYE SURGERY Right    growth removed from eye  . HERNIA REPAIR Bilateral    inguinal  . TEE WITHOUT  CARDIOVERSION N/A 10/04/2016   Procedure: TRANSESOPHAGEAL ECHOCARDIOGRAM (TEE);  Surgeon: [name], MD;  Location: [facility] OR;  Service: Open Heart Surgery;  Laterality: N/A;     Current Meds  Medication Sig  . acetaminophen (TYLENOL) 325 MG tablet Take 2 tablets (650 mg total) by mouth every 6 (six) hours as needed for mild pain.  Marland Kitchen apixaban (ELIQUIS) 5 MG TABS tablet Take 5 mg by mouth 2 (two)  times daily.  . Cyanocobalamin (VITAMIN B 12 PO) Take 100 mg by mouth every other day.   . Dulaglutide (TRULICITY) 1.5 MG/0.5ML SOPN Inject 1.5 mg into the skin once a week. Sunday  . empagliflozin (JARDIANCE) 25 MG TABS tablet Take 12.5 mg by mouth daily.  . famotidine (PEPCID) 20 MG tablet Take 1 tablet (20 mg total) by mouth 2 (two) times daily for 15 days.  . Insulin Glargine, 2 Unit Dial, (TOUJEO MAX SOLOSTAR) 300 UNIT/ML SOPN Inject 40 Units into the skin daily.  . metFORMIN (GLUCOPHAGE) 1000 MG tablet Take 1,000 mg by mouth 2 (two) times daily.   . metoprolol tartrate (LOPRESSOR) 25 MG tablet Take 1 tablet (25 mg total) by mouth 2 (two) times daily.  . Omega-3 Fatty Acids (FISH OIL) 1000 MG CPDR Take 1,000 mg by mouth daily.  . vitamin C (ASCORBIC ACID) 500 MG tablet Take 500 mg by mouth 2 (two) times daily.  Marland Kitchen zinc gluconate 50 MG tablet Take 50 mg by mouth daily.     Allergies:   Penicillins, Sulfa antibiotics, and Sulfamethoxazole   Social History   Tobacco Use  . Smoking status: Former Smoker    Types: Cigars    Quit date: 09/16/2016    Years since quitting: 2.5  . Smokeless tobacco: Never Used  Substance Use Topics  . Alcohol use: Yes    Comment: very rare  . Drug use: No     Family Hx: The patient's family history is not on file.  ROS:   Please see the history of present illness.    All other systems reviewed and are negative.   Prior CV studies:   The following studies were reviewed today:  Echo 12/13/2018: IMPRESSIONS    1. The left ventricle has normal systolic function with an ejection fraction of 60-65%. The cavity size was normal. There is mildly increased left ventricular wall thickness. Left ventricular diastolic Doppler parameters are consistent with  pseudonormalization.  2. The right ventricle has normal systolic function. The cavity was mildly enlarged. There is no increase in right ventricular wall thickness. Right ventricular systolic pressure  could not be assessed.  3. Left atrial size was mildly dilated.  4. Right atrial size was mildly dilated.  5. Poorly visualized AV bioprosthesis. There is no perivalvular AI. The mean AV gradient is stable at 14.85mmHg.  6. The aorta is normal unless otherwise noted.  7. The inferior vena cava was dilated in size with >50% respiratory variability.  Labs/Other Tests and Data Reviewed:    EKG:  No ECG reviewed.  Recent Labs: 12/13/2018: TSH 0.595 03/14/2019: B Natriuretic Peptide 24.8 03/18/2019: ALT 27; BUN 36; Creatinine, Ser 0.84; Hemoglobin 13.6; Platelets 228; Potassium 4.9; Sodium 138   Recent Lipid Panel Lab Results  Component Value Date/Time   CHOL 204 (H) 12/13/2018 03:22 AM   TRIG 204 (H) 12/13/2018 03:22 AM   HDL 39 (L) 12/13/2018 03:22 AM   CHOLHDL 5.2 12/13/2018 03:22 AM   LDLCALC 124 (H) 12/13/2018 03:22 AM    Wt Readings from  Last 3 Encounters:  03/29/19 194 lb (88 kg)  03/14/19 194 lb (88 kg)  12/26/18 206 lb 12.8 oz (93.8 kg)     Objective:    Vital Signs:  BP 139/75   Pulse 82   Ht 5\' 5"  (1.651 m)   Wt 194 lb (88 kg)   BMI 32.28 kg/m    VITAL SIGNS:  reviewed Remaining exam deferred as this is a telephone interview.   ASSESSMENT & PLAN:    1. HTN: BP elevated, likely secondary to decadron for recent Covid infection. Seems to be stabilizing on current Rx. Keep appt 12/29, continue current Rx, bring in BP readings.  2. Dizziness: c/w vertigo based on his description. Advised meclizine prn. Prior stroke evaluation reviewed and he has patent vertebral arteries without significant stenosis. No localizing symptoms. Chronically anticoagulated for PAF. 3. Aortic valve disease: most recent echo reviewed with normal bioprosthetic valve function noted.  4. Paroxysmal atrial fibrillation: no recent symptoms. Tolerating oral anticoagulation.   COVID-19 Education: The signs and symptoms of COVID-19 were discussed with the patient and how to seek care for testing  (follow up with PCP or arrange E-visit).  The importance of social distancing was discussed today.  Time:   Today, I have spent 21 minutes with the patient with telehealth technology discussing the above problems.     Medication Adjustments/Labs and Tests Ordered: Current medicines are reviewed at length with the patient today.  Concerns regarding medicines are outlined above.   Tests Ordered: No orders of the defined types were placed in this encounter.   Medication Changes: No orders of the defined types were placed in this encounter.   Follow Up:  Either In Person or Virtual as scheduled  Signed, [name], MD  04/05/2019 10:17 AM    [facility]

## 2019-03-29 NOTE — Telephone Encounter (Addendum)
Spoke to the patient at 1030 this AM.  He is concerned about his fluctuating BP and wishes to see Dr. Burt Knack ASAP.  He is extremely anxious. He is in a very loud environment and does not answer/cannot hear many questions asked.  He reports he was hospitalized with COVID at the beginning of the month and has felt better for 4-5 days.  He seems to feel OK now, just really worried.  Scheduled him for virtual visit with Dr. Burt Knack this afternoon to discuss symptoms and BP. ED precautions reviewed.   Confirm consent - "In the setting of the current Covid19 crisis, you are scheduled for a (phone or video) visit with your provider on (date) at (time).  Just as we do with many in-office visits, in order for you to participate in this visit, we must obtain consent.  If you'd like, I can send this to your mychart (if signed up) or email for you to review.  Otherwise, I can obtain your verbal consent now.  All virtual visits are billed to your insurance company just like a normal visit would be.  By agreeing to a virtual visit, we'd like you to understand that the technology does not allow for your provider to perform an examination, and thus may limit your provider's ability to fully assess your condition. If your provider identifies any concerns that need to be evaluated in person, we will make arrangements to do so.  Finally, though the technology is pretty good, we cannot assure that it will always work on either your or our end, and in the setting of a video visit, we may have to convert it to a phone-only visit.  In either situation, we cannot ensure that we have a secure connection.  Are you willing to proceed?" STAFF: Did the patient verbally acknowledge consent to telehealth visit? Document YES/NO here: YES    TELEPHONE CALL NOTE  Vernon Ruiz has been deemed a candidate for a follow-up tele-health visit to limit community exposure during the Covid-19 pandemic. I spoke with the patient via phone to  ensure availability of phone/video source, confirm preferred email & phone number, and discuss instructions and expectations.  I reminded Vernon Ruiz to be prepared with any vital sign and/or heart rhythm information that could potentially be obtained via home monitoring, at the time of his visit. I reminded Vernon Ruiz to expect a phone call prior to his visit.  Theodoro Parma, RN 03/29/2019 2:33 PM

## 2019-03-30 ENCOUNTER — Ambulatory Visit: Payer: Medicare Other

## 2019-04-05 ENCOUNTER — Encounter: Payer: Self-pay | Admitting: Cardiovascular Disease

## 2019-04-09 NOTE — Progress Notes (Signed)
Patient ID: GATES JIVIDEN                 DOB: February 25, 1951                      MRN: [id number]     HPI: Vernon Ruiz is a 68 y.o. male referred by Dr. Burt Knack to HTN clinic. PMH is significant for severe aortic stenosis, paroxysmal Afib, T2DM, HTN, hx of CVA, OSA, s/p AVR, and hx of COVID-19.  Patient contacted [place] office on 03/06/2019 regarding concerns about his BP readings. The patient stated his BP is usually 140s/80s. In the last 3 months, he has had 2 episodes where he gets dizzy and lightheaded, his BP shoots up to ~200/100, and then he feels fine and BP normalizes in about 10-15 minutes. During these episodes, he denies CP, SOB, heart racing, palpitations. He stated his amlodipine was stopped in the hospital because his BP was low. Pt recently advised to restart amlodipine 5 mg daily due to elevations in BP. The patient was reluctant as he was worried about low BP. He agreed to try amlodipine 2.5 mg daily. He was instructed to keep a BP report and bring to HTN clinic visit 12/15. Patient was recently hospitalized for COVID-19 from 12/1 to 12/16 therefore HTN clinic appt was rescheduled. Metoprolol was reduced to 25 mg BID during hospitalization.  Patient also had a recent appt with Dr. Burt Knack on 12/17; at appt he complained of dizziness, feels like the 'room is spinning. He's been having this intermittently since he had COVID and this occurred once in the hospital. It was noted he had elevated BP readings since D/C from hospital, however, Dr. Burt Knack attributed these readings to patient's dexamethasone use. Patient's most recent BP reading was 139/75 mmHg and pulse was 82 on 12/17. Dr. Burt Knack recommended patient to continue current HTN regimen, continue to record BP readings, and maintain HTN clinic appt.  Patient presents today for follow up HTN clinic appt. Patient is not currently taking atorvastatin because he is experiencing significant myalgias after COVID-19. Patient states he is not  currently taking amlodipine since D/C from the hospital. When his blood pressure is elevated (around 725 systolic) he states he will start sweating and become dizzy. He will experience these symptoms for about 5-10 minutes. Blood pressure will then drop 20-30 mmHg after symptoms resolve. He has not experienced any headaches, SOB, facial drooping, slurred speech, or chest pain. He does not report experiencing orthostatic hypotension. He does not take NSAIDs or pseudophederine. He drinks 2 cups of espresso daily in the morning and 1-2 cups of coffee around 11AM. He adds salt to food sometimes (wife does not add salt to food). He does not eat fast food, fried chicken, frozen foods, canned vegetables. He does not eat at restaurants due to COVID-19. He started taking meclizine on 12/19 to help with complaints of dizziness (purchased OTC). He states he has not noticed any difference in dizziness. He has been taking losartan 50 mg BID and has continued this medication since being discharged from the hospital (although in hospital D/C summary, it was stated that losartan was D/C due increased K during hospitalization)  Pt mentioned that he is having an upcoming procedure at [facility] with Dr Ellene Route. Called over to office - pt is scheduled for a right L3-4 microdiscectomy on 1/5 and will need to be off Eliquis 3 days. He recently had a stroke in Sept 2020 and was  bridged with Lovenox for a November procedure per Dr Excell Seltzer. Will bridge again due to afib with CHADS2VASc score of 6 (HTN, age, DM, recent stroke, CAD). Weight has decreased - will now qualify for 120mg  Lovenox using 1.5mg /kg daily dosing.  Current HTN meds: metoprolol tartrate 25 mg BID, losartan 50 mg BID  Previously tried: amlodipine 5 mg daily (BP dec during prior hospitalization), diltiazem 120 mg daily (changed to metoprolol) BP goal: <130/80 mmHg  Family History: brother (atrial fibrillation); paternal uncle (CAD and aortic  stenosis)  Social History: former smoker (smoked cigars, 2.5 pack year history; quit 09/16/2016), drinks alcohol occasionally  Diet: 3 meals per day -Breakfast: 2 eggs, tomatoes, bacon (every once in a while), sausage (every once in a while), yogurt  -Lunch: 11/16/2016 food - meatloaf, roast beef, stew -Dinner: [place] food - meatloaf, roast beef, stew -Snacks: fruits, nuts  -Drinks: espresso, coffee  Exercise: none -Recently bought a stationary bike - but it is not the right kind and is looking to purchase a new one   Home BP readings:  12/28 (last night) - 185/95 then checked again a few minutes later and it went down to 150/85 Most readings fluctuate between 130-180 / 70-89 mmHg; pulse is generally 60-80  Lipid Panel 12/13/2018 TC 204 TG 204 HDL 39 VLDL 41 LDL 124; atorvastatin 20 mg daily  Wt Readings from Last 3 Encounters:  03/29/19 194 lb (88 kg)  03/14/19 194 lb (88 kg)  12/26/18 206 lb 12.8 oz (93.8 kg)   BP Readings from Last 3 Encounters:  03/29/19 139/75  03/18/19 125/66  12/26/18 (!) 173/79   Pulse Readings from Last 3 Encounters:  03/29/19 82  03/18/19 63  12/26/18 (!) 53    Renal function: CrCl cannot be calculated (Patient's most recent lab result is older than the maximum 21 days allowed.).  Past Medical History:  Diagnosis Date  . Aortic stenosis    s/p AVR using a 28mm Edwards Magna-Ease pericardial valve 10/04/16  . Coronary artery disease   . Diabetes mellitus without complication (HCC)   . GERD (gastroesophageal reflux disease)   . Heart murmur   . Hypertension   . Obstructive sleep apnea    positive from a home test, still needs to get another study done  . Persistent atrial fibrillation (HCC)    s/p clipping of LA appendage 10/04/16  . Right carotid bruit 12/12/2018  . Right foot drop 11/07/2018    Current Outpatient Medications on File Prior to Visit  Medication Sig Dispense Refill  . acetaminophen (TYLENOL) 325 MG tablet Take 2 tablets (650  mg total) by mouth every 6 (six) hours as needed for mild pain.    11/09/2018 apixaban (ELIQUIS) 5 MG TABS tablet Take 5 mg by mouth 2 (two) times daily.    . Cyanocobalamin (VITAMIN B 12 PO) Take 100 mg by mouth every other day.     . Dulaglutide (TRULICITY) 1.5 MG/0.5ML SOPN Inject 1.5 mg into the skin once a week. Sunday    . empagliflozin (JARDIANCE) 25 MG TABS tablet Take 12.5 mg by mouth daily.    . famotidine (PEPCID) 20 MG tablet Take 1 tablet (20 mg total) by mouth 2 (two) times daily for 15 days. 30 tablet 0  . glimepiride (AMARYL) 4 MG tablet Take 4 mg by mouth 2 (two) times daily.     . Insulin Glargine, 2 Unit Dial, (TOUJEO MAX SOLOSTAR) 300 UNIT/ML SOPN Inject 40 Units into the skin daily.    Sunday  metFORMIN (GLUCOPHAGE) 1000 MG tablet Take 1,000 mg by mouth 2 (two) times daily.   1  . metoprolol tartrate (LOPRESSOR) 25 MG tablet Take 1 tablet (25 mg total) by mouth 2 (two) times daily. 60 tablet 0  . Omega-3 Fatty Acids (FISH OIL) 1000 MG CPDR Take 1,000 mg by mouth daily.    . vitamin C (ASCORBIC ACID) 500 MG tablet Take 500 mg by mouth 2 (two) times daily.    Marland Kitchen zinc gluconate 50 MG tablet Take 50 mg by mouth daily.     No current facility-administered medications on file prior to visit.    Allergies  Allergen Reactions  . Penicillins Itching and Rash    All over body  Has patient had a PCN reaction causing immediate rash, facial/tongue/throat swelling, SOB or lightheadedness with hypotension: yes Has patient had a PCN reaction causing severe rash involving mucus membranes or skin necrosis: no Has patient had a PCN reaction that required hospitalization no Has patient had a PCN reaction occurring within the last 10 years: no If all of the above answers are "NO", then may proceed with Cephalosporin use.  . Sulfa Antibiotics Rash    Rash  . Sulfamethoxazole Rash     Assessment/Plan:  1. Hypertension - BP is at goal < 130/80 mmHg in clinic checked twice, home cuff reading a bit  higher. Recommended patient to continue to monitor and record BP readings. Scheduled appt for 05/08/2019 to re-assess BP readings and to calibrate home BP cuff. Considering patient's blood pressure is at goal, will continue current regimen of metoprolol tartrate 25 mg BID and losartan 50 mg BID. Obtained BMET today to re-assess Scr and K since patient was confused and re-started losartan after hospital discharge. It is possible complaints of dizziness and sweating may be related to atrial fibrillation - will defer to Dr. Excell Seltzer for his expertise. Patient verbalized understanding to the plan. Follow up on 1/26/20221.  2. Hyperlipidemia - LDL goal < 70 mg/dL; therefore patient is not at goal. It appears atorvastatin 20 mg daily was initiated on 12/13/2018 and most recent lipid panel is likely not completely reflective of current LDL. Regardless, patient is indicated for a high intensity statin and is experiencing myalgias with atorvastatin therefore will initiate rosuvastatin 20 mg daily instead. It appears patient's insurance will cover a 90-day supply of rosuvastatin for $0 if patient uses mail order pharmacy - sent prescription to [facility]. Patient verbalized understanding. Will re-assess tolerance to statin at f/u appt on 1/26 and schedule lipid panel at that time if patient is tolerating.  Thank you for involving pharmacy to assist in providing Mr. Rile care.   Zachery Conch, PharmD PGY2 Ambulatory Care Pharmacy Resident

## 2019-04-10 ENCOUNTER — Other Ambulatory Visit: Payer: Self-pay

## 2019-04-10 ENCOUNTER — Ambulatory Visit (INDEPENDENT_AMBULATORY_CARE_PROVIDER_SITE_OTHER): Payer: Medicare Other | Admitting: Pharmacist

## 2019-04-10 VITALS — BP 110/60 | HR 79 | Wt 185.0 lb

## 2019-04-10 DIAGNOSIS — I63432 Cerebral infarction due to embolism of left posterior cerebral artery: Secondary | ICD-10-CM | POA: Diagnosis not present

## 2019-04-10 DIAGNOSIS — I480 Paroxysmal atrial fibrillation: Secondary | ICD-10-CM

## 2019-04-10 DIAGNOSIS — I10 Essential (primary) hypertension: Secondary | ICD-10-CM

## 2019-04-10 MED ORDER — ROSUVASTATIN CALCIUM 20 MG PO TABS: 20.0000 mg | ORAL_TABLET | Freq: Every day | ORAL | 3 refills | Status: AC

## 2019-04-10 MED ORDER — ENOXAPARIN SODIUM 120 MG/0.8ML ~~LOC~~ SOLN: 120.0000 mg | SUBCUTANEOUS | 0 refills | Status: DC

## 2019-04-10 NOTE — Patient Instructions (Addendum)
It was a pleasure seeing you in clinic today Vernon Ruiz!  Today the plan is... 1) Continue losartan 50 mg twice daily and metoprolol 25 mg twice daily. Someone will contact you tomorrow about your lab results from today.  2) Continue to monitor blood pressure readings 3) We will have a follow up office visit appointment to calibrate your BP machine on 05/08/2019 at 9:00 AM. Please bring your blood pressure cuff and all of your medications. 4) DO NOT restart amlodipine.  5) If [facility] (pharmacist) contacts you about statin medicine you will start rosuvastatin (Crestor) 20 mg once daily and stop atorvastatin. Wait until [facility] calls you until you stop/start any statin medicine.  Blood thinner instructions for your procedure on 1/5:   04/12/18:Last dose of Eliquis 5mg at 8AM, inject 1 syringe of Lovenox 120mg /mlinto the fatty tissue of your abdomen at 8PM  04/13/18:No Eliquis, inject Lovenox 120mg /ml at 8PM  04/14/18:No Eliquis, inject Lovenox 120mg /ml at 8PM  04/15/18:No Eliquis, No Lovenox  04/16/18 (Morningof Procedure):No Eliquis, No Lovenox prior to procedure   04/16/18 (After Procedure):Resume Eliquis 5mg  in evening or as directed by your doctor. Ideally should resume Eliquis within 24 hours of your procedure.  Please call the PharmD clinic at [phone] if you have any questions that you would like to speak with a pharmacist about [facility], [place], [place]).

## 2019-04-11 ENCOUNTER — Telehealth: Payer: Self-pay | Admitting: *Deleted

## 2019-04-11 LAB — BASIC METABOLIC PANEL
BUN/Creatinine Ratio: 16 (ref 10–24)
BUN: 16 mg/dL (ref 8–27)
CO2: 23 mmol/L (ref 20–29)
Calcium: 9.6 mg/dL (ref 8.6–10.2)
Chloride: 103 mmol/L (ref 96–106)
Creatinine, Ser: 0.98 mg/dL (ref 0.76–1.27)
GFR calc Af Amer: 92 mL/min/{1.73_m2} (ref >59)
GFR calc non Af Amer: 79 mL/min/{1.73_m2} (ref >59)
Glucose: 204 mg/dL — ABNORMAL HIGH (ref 65–99)
Potassium: 4.7 mmol/L (ref 3.5–5.2)
Sodium: 143 mmol/L (ref 134–144)

## 2019-04-11 NOTE — Telephone Encounter (Signed)
Pharmacy please comment on Eliquis and then I will contact the patient.  Kerin Ransom PA-C 04/11/2019 9:55 AM

## 2019-04-11 NOTE — Telephone Encounter (Signed)
   [facility] Pre-operative Risk Assessment    Request for surgical clearance:  1. What type of surgery is being performed? RIGHT L3-4 MICRODISCECTOMY    2. When is this surgery scheduled? 04/17/19   3. What type of clearance is required (medical clearance vs. Pharmacy clearance to hold med vs. Both)? BOTH  4. Are there any medications that need to be held prior to surgery and how long? ELIQUIS   5. Practice name and name of physician performing surgery? [facility]; DR. Kristeen Miss   6. What is your office phone number [phone]    7.   What is your office fax number [phone]  8.   Anesthesia type (None, local, MAC, general) ? GENERAL   Vernon Ruiz 04/11/2019, 9:38 AM  _________________________________________________________________   (provider comments below)

## 2019-04-11 NOTE — Telephone Encounter (Signed)
Patient with diagnosis of afib on Eliquis for anticoagulation.    Procedure: RIGHT L3-4 MICRODISCECTOMY  Date of procedure: 04/17/2019  CHADS2-VASc score of  6 (HTN, AGE, DM2, stroke/tia x 2, CAD)  CrCl 72 ml/min  Office policy would be to hold anticoagulation for 3 days due to spinal procedure. However, patient is high risk off anticoagulation due to prior stroke. Will defer to Dr. Burt Knack.

## 2019-04-11 NOTE — Telephone Encounter (Signed)
Pt was bridged with Lovenox at PharmD appt yesterday due to 3 day required Eliquis hold for spinal procedure in setting of Sept 2020 stroke. He was previously bridged with Lovenox bridge for 2 day Eliquis hold for November procedure per Dr Burt Knack.

## 2019-04-11 NOTE — Telephone Encounter (Signed)
   Primary Cardiologist: Sherren Mocha, MD  Chart reviewed and patient contacted as part of pre-operative protocol coverage. Given past medical history and time since last visit, based on ACC/AHA guidelines, Vernon Ruiz would be at acceptable risk for the planned procedure without further cardiovascular testing.   Reviewed anticoagulation with our pharmacist and dr Burt Knack- OK to hold Eliquis 3 days pre op but patient will require Lovenox bridging which has been ordered.   I will route this recommendation to the requesting party via Epic fax function and remove from pre-op pool.  Please call with questions.  Kerin Ransom, PA-C 04/11/2019, 12:03 PM

## 2019-04-11 NOTE — Telephone Encounter (Signed)
Hold eliquis x 3 days, start back ASAP following procedure. thanks

## 2019-04-12 NOTE — Telephone Encounter (Signed)
Agree. thanks

## 2019-04-17 ENCOUNTER — Other Ambulatory Visit: Payer: Self-pay

## 2019-04-17 DIAGNOSIS — M5126 Other intervertebral disc displacement, lumbar region: Secondary | ICD-10-CM | POA: Diagnosis not present

## 2019-04-17 DIAGNOSIS — M5116 Intervertebral disc disorders with radiculopathy, lumbar region: Secondary | ICD-10-CM | POA: Diagnosis not present

## 2019-04-24 DIAGNOSIS — M5116 Intervertebral disc disorders with radiculopathy, lumbar region: Secondary | ICD-10-CM | POA: Diagnosis not present

## 2019-04-24 DIAGNOSIS — M48061 Spinal stenosis, lumbar region without neurogenic claudication: Secondary | ICD-10-CM | POA: Diagnosis not present

## 2019-04-27 ENCOUNTER — Telehealth: Payer: Self-pay

## 2019-04-27 DIAGNOSIS — I10 Essential (primary) hypertension: Secondary | ICD-10-CM

## 2019-04-27 MED ORDER — CHLORTHALIDONE 25 MG PO TABS: 25.0000 mg | ORAL_TABLET | Freq: Every day | ORAL | 3 refills | Status: DC

## 2019-04-27 NOTE — Telephone Encounter (Signed)
-----   Message from [name], MD sent at 04/24/2019  3:16 PM EST ----- Let's try chlorthalidone 25 mg daily with a follow-up metabolic panel in 2 weeks. thanks

## 2019-04-27 NOTE — Telephone Encounter (Signed)
Instructed patient to START CHLORTHALIDONE 25 mg daily. He will have BMET drawn when he comes for HTN/Lipid clinic appointment in a couple weeks. He was grateful for assistance.

## 2019-05-03 ENCOUNTER — Telehealth: Payer: Self-pay | Admitting: Cardiovascular Disease

## 2019-05-03 DIAGNOSIS — M545 Low back pain: Secondary | ICD-10-CM | POA: Diagnosis not present

## 2019-05-03 DIAGNOSIS — M48062 Spinal stenosis, lumbar region with neurogenic claudication: Secondary | ICD-10-CM | POA: Diagnosis not present

## 2019-05-03 NOTE — Telephone Encounter (Signed)
New message:     Patient calling stating that some one called him I did not see a note 

## 2019-05-03 NOTE — Telephone Encounter (Signed)
Informed the patient no one called him. He was confused about the robocall and his appointment times. Confirmed with him he needs to arrive at 0845 for 0900 visit and he will have labs drawn as well. He was grateful for assistance.

## 2019-05-03 NOTE — Telephone Encounter (Signed)
I have not called him either - not sure if it was an automated appt reminder for 1/26 appt?

## 2019-05-03 NOTE — Telephone Encounter (Signed)
I did not attempt to call him

## 2019-05-07 DIAGNOSIS — I10 Essential (primary) hypertension: Secondary | ICD-10-CM | POA: Diagnosis not present

## 2019-05-07 DIAGNOSIS — I480 Paroxysmal atrial fibrillation: Secondary | ICD-10-CM | POA: Diagnosis not present

## 2019-05-07 DIAGNOSIS — E1169 Type 2 diabetes mellitus with other specified complication: Secondary | ICD-10-CM | POA: Diagnosis not present

## 2019-05-07 DIAGNOSIS — E785 Hyperlipidemia, unspecified: Secondary | ICD-10-CM | POA: Diagnosis not present

## 2019-05-07 DIAGNOSIS — M47816 Spondylosis without myelopathy or radiculopathy, lumbar region: Secondary | ICD-10-CM | POA: Diagnosis not present

## 2019-05-07 DIAGNOSIS — M48062 Spinal stenosis, lumbar region with neurogenic claudication: Secondary | ICD-10-CM | POA: Diagnosis not present

## 2019-05-07 DIAGNOSIS — M545 Low back pain: Secondary | ICD-10-CM | POA: Diagnosis not present

## 2019-05-07 NOTE — Progress Notes (Signed)
Patient ID: [name]                 DOB: 02-Apr-1951                      MRN: [id number]     HPI: Vernon Ruiz is a 69 y.o. male referred by Dr. Excell Ruiz to HTN clinic. PMH is significant for severe aortic stenosis, paroxysmal Afib, T2DM, uncontrolled HTN, hx of CVA, OSA, s/p AVR, and hx of COVID-19. The patient has a history of fluctuating blood pressure from 110-200 mmHg systolic.   At last visit in HTN clinic on 04/10/19, the patient reports he stopped taking atorvastatin 20 mg because he was experiencing significant myalgias after COVID-19, so he was switched to rosuvastatin 20 mg. He also stopped taking amlodipine in the hospital due to normotensive BP. He stated he was getting dizzy and sweaty when systolic is ~180 mmHg. BP in clinic was below goal at 110/60 mmHg so no HTN medication chages were made. BMET on 04/10/19 was stable except elevated glucose at 204 mg/dL. Patient was recently bridged with Lovenox due to 3 day Eliquis hold for spinal procedure.   Patient arrives with his wife at HTN/lipid clinic for follow-up. The patient reports he is taking prednisone for recent back surgery and has 1 month left on the prednisone taper. The patient says he will start taking rosuvastatin 20 mg on Monday. He has only had symptoms of dizziness and sweating twice in the past month. He reports mild swelling in his right foot. He states that he started taking amlodipine 5 mg on Saturday, and has been feeling better with no increased foot swelling. The patient reports home BP reading of 147/67 2 days ago and says most readings have been around that recently.  He also mentions HR of 60-70s at home. Dr. Excell Ruiz recommended starting chlorthalidone 25 mg and the patient was contacted via phone on 04/27/19. However the patient and his wife say that he never started taking this medication. The patient is interested in a diuretic due to his foot swelling.  BP in clinic today is 128/70 and 123/66 mmHg with HR  62.  Current HTN meds: metoprolol tartrate 25 mg twice daily, losartan 50 mg twice daily, amlodipine 5 mg daily Previously tried: diltiazem 120 mg daily (changed to metoprolol) BP goal: <130/80 mmHg  Family History: brother (atrial fibrillation); paternal uncle (CAD and aortic stenosis)  Social History: former smoker (smoked cigars, 2.5 pack year history; quit 09/16/2016), drinks alcohol occasionally  Diet: 3 meals per day -Breakfast: 2 eggs, tomatoes, bacon (every once in a while), sausage (every once in a while), yogurt  -Lunch: Turkey food - meatloaf, roast beef, stew -Dinner: Turkey food - meatloaf, roast beef, stew -Snacks: fruits, nuts  -Drinks: espresso, coffee  Exercise: None  Home BP readings: 147/67 2 days prior- wife states BP readings at home have been around this number  Lipid Panel 12/13/2018 TC 204 TG 204 HDL 39 VLDL 41 LDL 124; atorvastatin 20 mg daily  Wt Readings from Last 3 Encounters:  04/10/19 185 lb (83.9 kg)  03/29/19 194 lb (88 kg)  03/14/19 194 lb (88 kg)   BP Readings from Last 3 Encounters:  04/10/19 110/60  03/29/19 139/75  03/18/19 125/66   Pulse Readings from Last 3 Encounters:  04/10/19 79  03/29/19 82  03/18/19 63    Renal function: CrCl cannot be calculated (Patient's most recent lab result is older than the maximum  21 days allowed.).  Past Medical History:  Diagnosis Date  . Aortic stenosis    s/p AVR using a 71mm Edwards Magna-Ease pericardial valve 10/04/16  . Coronary artery disease   . Diabetes mellitus without complication ([facility])   . GERD (gastroesophageal reflux disease)   . Heart murmur   . Hypertension   . Obstructive sleep apnea    positive from a home test, still needs to get another study done  . Persistent atrial fibrillation ([facility])    s/p clipping of LA appendage 10/04/16  . Right carotid bruit 12/12/2018  . Right foot drop 11/07/2018    Current Outpatient Medications on File Prior to Visit  Medication Sig Dispense  Refill  . acetaminophen (TYLENOL) 325 MG tablet Take 2 tablets (650 mg total) by mouth every 6 (six) hours as needed for mild pain.    Marland Kitchen apixaban (ELIQUIS) 5 MG TABS tablet Take 5 mg by mouth 2 (two) times daily.    . chlorthalidone (HYGROTON) 25 MG tablet Take 1 tablet (25 mg total) by mouth daily. 90 tablet 3  . Cyanocobalamin (VITAMIN B 12 PO) Take 100 mg by mouth every other day.     . Dulaglutide (TRULICITY) 1.5 UV/2.5DG SOPN Inject 1.5 mg into the skin once a week. Sunday    . empagliflozin (JARDIANCE) 25 MG TABS tablet Take 12.5 mg by mouth daily.    Marland Kitchen enoxaparin (LOVENOX) 120 MG/0.8ML injection Inject 0.8 mLs (120 mg total) into the skin daily for 3 days. 2.4 mL 0  . famotidine (PEPCID) 20 MG tablet Take 1 tablet (20 mg total) by mouth 2 (two) times daily for 15 days. 30 tablet 0  . glimepiride (AMARYL) 4 MG tablet Take 4 mg by mouth 2 (two) times daily.     . Insulin Glargine, 2 Unit Dial, (TOUJEO MAX SOLOSTAR) 300 UNIT/ML SOPN Inject 40 Units into the skin daily.    Marland Kitchen losartan (COZAAR) 50 MG tablet Take 50 mg by mouth 2 (two) times daily.    . meclizine (ANTIVERT) 25 MG tablet Take 25 mg by mouth daily.    . metFORMIN (GLUCOPHAGE) 1000 MG tablet Take 1,000 mg by mouth 2 (two) times daily.   1  . metoprolol tartrate (LOPRESSOR) 25 MG tablet Take 1 tablet (25 mg total) by mouth 2 (two) times daily. 60 tablet 0  . Omega-3 Fatty Acids (FISH OIL) 1000 MG CPDR Take 1,000 mg by mouth daily.    . rosuvastatin (CRESTOR) 20 MG tablet Take 1 tablet (20 mg total) by mouth daily. 90 tablet 3  . vitamin C (ASCORBIC ACID) 500 MG tablet Take 500 mg by mouth 2 (two) times daily.    Marland Kitchen zinc gluconate 50 MG tablet Take 50 mg by mouth daily.     No current facility-administered medications on file prior to visit.    Allergies  Allergen Reactions  . Penicillins Itching and Rash    All over body  Has patient had a PCN reaction causing immediate rash, facial/tongue/throat swelling, SOB or  lightheadedness with hypotension: yes Has patient had a PCN reaction causing severe rash involving mucus membranes or skin necrosis: no Has patient had a PCN reaction that required hospitalization no Has patient had a PCN reaction occurring within the last 10 years: no If all of the above answers are "NO", then may proceed with Cephalosporin use.  . Sulfa Antibiotics Rash    Rash  . Sulfamethoxazole Rash     Assessment/Plan:  1. Hypertension - BP is  at goal of < 130/80 mmHg in clinic, however he reports elevated BP readings at home. Blood pressure cuff has not been validated. No medication changes today. Continue taking metoprolol tartrate 25 mg twice daily, losartan 50 mg twice daily, and amlodipine 5 mg daily. Advised patient to check BP and pulse 2 hours after taking morning medications. We will contact Dr. Excell Ruiz about starting a diuretic, possibly furosemide PRN. Will follow-up via phone in 2 weeks.   2. Hyperlipidemia - LDL goal is  < 70 mg/dL. Patient has delayed stating statin due to back surgery. Start taking rosuvastatin 20 mg once daily on Monday per patient. Will check lipid panel in 2-3 months. Will follow-up via phone call in 2 weeks to confirm patient has started taking rosuvastatin and schedule a time for lipid panel.  Paulette Blanch, PharmD Candidate   Olene Floss, Pharm.D, BCPS, CPP [facility]  [address] [place], [place] [address]  Phone: [phone]; Fax: [phone]

## 2019-05-08 ENCOUNTER — Other Ambulatory Visit: Payer: Medicare Other

## 2019-05-08 ENCOUNTER — Other Ambulatory Visit: Payer: Self-pay | Admitting: Cardiovascular Disease

## 2019-05-08 ENCOUNTER — Other Ambulatory Visit: Payer: Self-pay

## 2019-05-08 ENCOUNTER — Encounter: Payer: Self-pay | Admitting: Pharmacist

## 2019-05-08 ENCOUNTER — Ambulatory Visit (INDEPENDENT_AMBULATORY_CARE_PROVIDER_SITE_OTHER): Payer: Medicare Other | Admitting: Pharmacist

## 2019-05-08 VITALS — BP 128/70 | HR 62

## 2019-05-08 DIAGNOSIS — I10 Essential (primary) hypertension: Secondary | ICD-10-CM | POA: Diagnosis not present

## 2019-05-08 DIAGNOSIS — E785 Hyperlipidemia, unspecified: Secondary | ICD-10-CM | POA: Diagnosis not present

## 2019-05-08 MED ORDER — FUROSEMIDE 20 MG PO TABS: ORAL_TABLET | ORAL | 1 refills | Status: DC

## 2019-05-08 MED ORDER — FUROSEMIDE 20 MG PO TABS: 20.0000 mg | ORAL_TABLET | Freq: Every day | ORAL | 1 refills | Status: DC

## 2019-05-08 NOTE — Patient Instructions (Addendum)
It was nice seeing you today!!  Please continue taking metoprolol tartrate 25 mg twice daily, losartan 50 mg twice daily, and amlodipine 5 mg once daily.  We will talk to Dr. Excell Seltzer about prescribing you a diuretic.  Please start checking your blood pressure and pulse 2 hours after taking your morning medications.  Call us at [phone] with any questions or concerns.

## 2019-05-08 NOTE — Progress Notes (Signed)
Yes that sounds fine.  I agree with furosemide 20 mg as needed for swelling.  Thanks

## 2019-05-09 ENCOUNTER — Telehealth: Payer: Self-pay

## 2019-05-09 ENCOUNTER — Other Ambulatory Visit: Payer: Self-pay | Admitting: Cardiovascular Disease

## 2019-05-09 ENCOUNTER — Other Ambulatory Visit: Payer: Self-pay

## 2019-05-10 ENCOUNTER — Other Ambulatory Visit: Payer: Self-pay

## 2019-05-10 ENCOUNTER — Telehealth: Payer: Self-pay | Admitting: Pharmacist

## 2019-05-10 DIAGNOSIS — M48062 Spinal stenosis, lumbar region with neurogenic claudication: Secondary | ICD-10-CM | POA: Diagnosis not present

## 2019-05-10 DIAGNOSIS — M545 Low back pain: Secondary | ICD-10-CM | POA: Diagnosis not present

## 2019-05-10 MED ORDER — FUROSEMIDE 20 MG PO TABS: ORAL_TABLET | ORAL | 3 refills | Status: DC

## 2019-05-10 NOTE — Telephone Encounter (Signed)
Confirmed with Malena Peer, pharmD, that she spoke with the patient and called in furosemide 20 mg daily PRN.

## 2019-05-10 NOTE — Telephone Encounter (Signed)
Per Dr. Excell Seltzer ok to use furosemide 20mg  daily as needed. Patient was called on 1/26 and advised that he is to only take as needed. Confirmed with pharmacy that patient did not pick up chlorthalidone and asked them to cancel the Rx.

## 2019-05-11 ENCOUNTER — Other Ambulatory Visit: Payer: Self-pay | Admitting: Neurological Surgery

## 2019-05-11 ENCOUNTER — Other Ambulatory Visit (HOSPITAL_COMMUNITY): Payer: Self-pay | Admitting: Neurological Surgery

## 2019-05-11 DIAGNOSIS — M5440 Lumbago with sciatica, unspecified side: Secondary | ICD-10-CM | POA: Insufficient documentation

## 2019-05-11 DIAGNOSIS — M5441 Lumbago with sciatica, right side: Secondary | ICD-10-CM

## 2019-05-14 DIAGNOSIS — M545 Low back pain: Secondary | ICD-10-CM | POA: Diagnosis not present

## 2019-05-14 DIAGNOSIS — M48062 Spinal stenosis, lumbar region with neurogenic claudication: Secondary | ICD-10-CM | POA: Diagnosis not present

## 2019-05-16 DIAGNOSIS — R609 Edema, unspecified: Secondary | ICD-10-CM | POA: Diagnosis not present

## 2019-05-16 DIAGNOSIS — R937 Abnormal findings on diagnostic imaging of other parts of musculoskeletal system: Secondary | ICD-10-CM | POA: Diagnosis not present

## 2019-05-16 DIAGNOSIS — Z8616 Personal history of COVID-19: Secondary | ICD-10-CM | POA: Diagnosis not present

## 2019-05-17 DIAGNOSIS — M545 Low back pain: Secondary | ICD-10-CM | POA: Diagnosis not present

## 2019-05-17 DIAGNOSIS — M48062 Spinal stenosis, lumbar region with neurogenic claudication: Secondary | ICD-10-CM | POA: Diagnosis not present

## 2019-05-21 DIAGNOSIS — M545 Low back pain: Secondary | ICD-10-CM | POA: Diagnosis not present

## 2019-05-21 DIAGNOSIS — M48062 Spinal stenosis, lumbar region with neurogenic claudication: Secondary | ICD-10-CM | POA: Diagnosis not present

## 2019-05-23 ENCOUNTER — Telehealth: Payer: Self-pay | Admitting: Pharmacist

## 2019-05-23 NOTE — Telephone Encounter (Signed)
Called patient who states that his blood pressure has been good. It runs about 125/60-80. He does complain about swelling. States that pharmacy will not fill his lasix bc he has a sulfa allergy. States that allergy was a rash. I discussed the low cross reactivity risk with lasix and patient willing to try. He is aware, there is a potential he could have a rash. I will confirm with Dr. Excell Seltzer and then call pharmacy to tell them it is ok to fill.

## 2019-05-23 NOTE — Telephone Encounter (Signed)
Called [facility] and requested they fill lasix. Pharmacy states they will get ready for patient

## 2019-05-24 ENCOUNTER — Encounter (HOSPITAL_COMMUNITY): Payer: Self-pay

## 2019-05-24 ENCOUNTER — Ambulatory Visit (HOSPITAL_COMMUNITY): Payer: Medicare Other

## 2019-05-25 DIAGNOSIS — M545 Low back pain: Secondary | ICD-10-CM | POA: Diagnosis not present

## 2019-05-25 DIAGNOSIS — M48062 Spinal stenosis, lumbar region with neurogenic claudication: Secondary | ICD-10-CM | POA: Diagnosis not present

## 2019-05-25 DIAGNOSIS — M47816 Spondylosis without myelopathy or radiculopathy, lumbar region: Secondary | ICD-10-CM | POA: Diagnosis not present

## 2019-05-25 DIAGNOSIS — I4819 Other persistent atrial fibrillation: Secondary | ICD-10-CM | POA: Diagnosis not present

## 2019-05-25 DIAGNOSIS — E1169 Type 2 diabetes mellitus with other specified complication: Secondary | ICD-10-CM | POA: Diagnosis not present

## 2019-05-25 DIAGNOSIS — I10 Essential (primary) hypertension: Secondary | ICD-10-CM | POA: Diagnosis not present

## 2019-05-25 DIAGNOSIS — E785 Hyperlipidemia, unspecified: Secondary | ICD-10-CM | POA: Diagnosis not present

## 2019-05-25 DIAGNOSIS — I480 Paroxysmal atrial fibrillation: Secondary | ICD-10-CM | POA: Diagnosis not present

## 2019-05-28 DIAGNOSIS — M48062 Spinal stenosis, lumbar region with neurogenic claudication: Secondary | ICD-10-CM | POA: Diagnosis not present

## 2019-05-28 DIAGNOSIS — M545 Low back pain: Secondary | ICD-10-CM | POA: Diagnosis not present

## 2019-05-30 DIAGNOSIS — M545 Low back pain: Secondary | ICD-10-CM | POA: Diagnosis not present

## 2019-05-30 DIAGNOSIS — M48062 Spinal stenosis, lumbar region with neurogenic claudication: Secondary | ICD-10-CM | POA: Diagnosis not present

## 2019-06-04 DIAGNOSIS — M48062 Spinal stenosis, lumbar region with neurogenic claudication: Secondary | ICD-10-CM | POA: Diagnosis not present

## 2019-06-04 DIAGNOSIS — M545 Low back pain: Secondary | ICD-10-CM | POA: Diagnosis not present

## 2019-06-05 DIAGNOSIS — E119 Type 2 diabetes mellitus without complications: Secondary | ICD-10-CM | POA: Diagnosis not present

## 2019-06-05 DIAGNOSIS — Z6836 Body mass index (BMI) 36.0-36.9, adult: Secondary | ICD-10-CM | POA: Diagnosis not present

## 2019-06-05 DIAGNOSIS — Z7189 Other specified counseling: Secondary | ICD-10-CM | POA: Diagnosis not present

## 2019-06-05 DIAGNOSIS — I4891 Unspecified atrial fibrillation: Secondary | ICD-10-CM | POA: Diagnosis not present

## 2019-06-05 DIAGNOSIS — R809 Proteinuria, unspecified: Secondary | ICD-10-CM | POA: Diagnosis not present

## 2019-06-05 DIAGNOSIS — I10 Essential (primary) hypertension: Secondary | ICD-10-CM | POA: Diagnosis not present

## 2019-06-05 DIAGNOSIS — E785 Hyperlipidemia, unspecified: Secondary | ICD-10-CM | POA: Diagnosis not present

## 2019-06-07 DIAGNOSIS — M545 Low back pain: Secondary | ICD-10-CM | POA: Diagnosis not present

## 2019-06-07 DIAGNOSIS — M48062 Spinal stenosis, lumbar region with neurogenic claudication: Secondary | ICD-10-CM | POA: Diagnosis not present

## 2019-06-07 DIAGNOSIS — Z23 Encounter for immunization: Secondary | ICD-10-CM | POA: Diagnosis not present

## 2019-06-11 DIAGNOSIS — M48062 Spinal stenosis, lumbar region with neurogenic claudication: Secondary | ICD-10-CM | POA: Diagnosis not present

## 2019-06-11 DIAGNOSIS — M545 Low back pain: Secondary | ICD-10-CM | POA: Diagnosis not present

## 2019-06-14 DIAGNOSIS — M5416 Radiculopathy, lumbar region: Secondary | ICD-10-CM | POA: Diagnosis not present

## 2019-06-14 DIAGNOSIS — R29898 Other symptoms and signs involving the musculoskeletal system: Secondary | ICD-10-CM | POA: Diagnosis not present

## 2019-06-15 DIAGNOSIS — M48062 Spinal stenosis, lumbar region with neurogenic claudication: Secondary | ICD-10-CM | POA: Diagnosis not present

## 2019-06-15 DIAGNOSIS — M545 Low back pain: Secondary | ICD-10-CM | POA: Diagnosis not present

## 2019-06-18 ENCOUNTER — Other Ambulatory Visit (HOSPITAL_COMMUNITY): Payer: Self-pay | Admitting: Neurological Surgery

## 2019-06-18 ENCOUNTER — Other Ambulatory Visit: Payer: Self-pay | Admitting: Neurological Surgery

## 2019-06-18 DIAGNOSIS — M48062 Spinal stenosis, lumbar region with neurogenic claudication: Secondary | ICD-10-CM | POA: Diagnosis not present

## 2019-06-18 DIAGNOSIS — M5416 Radiculopathy, lumbar region: Secondary | ICD-10-CM

## 2019-06-18 DIAGNOSIS — M5441 Lumbago with sciatica, right side: Secondary | ICD-10-CM

## 2019-06-18 DIAGNOSIS — M545 Low back pain: Secondary | ICD-10-CM | POA: Diagnosis not present

## 2019-06-21 DIAGNOSIS — M48062 Spinal stenosis, lumbar region with neurogenic claudication: Secondary | ICD-10-CM | POA: Diagnosis not present

## 2019-06-21 DIAGNOSIS — M545 Low back pain: Secondary | ICD-10-CM | POA: Diagnosis not present

## 2019-06-25 DIAGNOSIS — I10 Essential (primary) hypertension: Secondary | ICD-10-CM | POA: Diagnosis not present

## 2019-06-25 DIAGNOSIS — Z6832 Body mass index (BMI) 32.0-32.9, adult: Secondary | ICD-10-CM | POA: Diagnosis not present

## 2019-06-25 DIAGNOSIS — M5441 Lumbago with sciatica, right side: Secondary | ICD-10-CM | POA: Diagnosis not present

## 2019-06-28 ENCOUNTER — Ambulatory Visit (HOSPITAL_COMMUNITY)
Admission: RE | Admit: 2019-06-28 | Discharge: 2019-06-28 | Disposition: A | Discharge status: Home / Self Care | Payer: Medicare Other | Source: Ambulatory Visit | Attending: Neurological Surgery | Admitting: Neurological Surgery

## 2019-06-28 ENCOUNTER — Other Ambulatory Visit: Payer: Self-pay

## 2019-06-28 DIAGNOSIS — M79604 Pain in right leg: Secondary | ICD-10-CM | POA: Insufficient documentation

## 2019-06-28 DIAGNOSIS — M5416 Radiculopathy, lumbar region: Secondary | ICD-10-CM | POA: Diagnosis not present

## 2019-06-28 DIAGNOSIS — I700 Atherosclerosis of aorta: Secondary | ICD-10-CM | POA: Insufficient documentation

## 2019-06-28 DIAGNOSIS — M50322 Other cervical disc degeneration at C5-C6 level: Secondary | ICD-10-CM | POA: Diagnosis not present

## 2019-06-28 DIAGNOSIS — E049 Nontoxic goiter, unspecified: Secondary | ICD-10-CM | POA: Insufficient documentation

## 2019-06-28 DIAGNOSIS — R531 Weakness: Secondary | ICD-10-CM | POA: Diagnosis not present

## 2019-06-28 DIAGNOSIS — M4804 Spinal stenosis, thoracic region: Secondary | ICD-10-CM | POA: Diagnosis not present

## 2019-06-28 DIAGNOSIS — M5021 Other cervical disc displacement,  high cervical region: Secondary | ICD-10-CM | POA: Diagnosis not present

## 2019-06-28 DIAGNOSIS — M4802 Spinal stenosis, cervical region: Secondary | ICD-10-CM | POA: Diagnosis not present

## 2019-06-28 DIAGNOSIS — M48061 Spinal stenosis, lumbar region without neurogenic claudication: Secondary | ICD-10-CM | POA: Diagnosis not present

## 2019-06-28 DIAGNOSIS — M21379 Foot drop, unspecified foot: Secondary | ICD-10-CM | POA: Diagnosis not present

## 2019-06-28 DIAGNOSIS — M5134 Other intervertebral disc degeneration, thoracic region: Secondary | ICD-10-CM | POA: Diagnosis not present

## 2019-06-28 DIAGNOSIS — M50323 Other cervical disc degeneration at C6-C7 level: Secondary | ICD-10-CM | POA: Diagnosis not present

## 2019-06-28 LAB — GLUCOSE, CAPILLARY: Glucose-Capillary: 107 mg/dL — ABNORMAL HIGH (ref 70–99)

## 2019-06-28 MED ORDER — DIAZEPAM 5 MG PO TABS
10.0000 mg | ORAL_TABLET | Freq: Once | ORAL | Status: AC
  Administered 2019-06-28: 10 mg via ORAL
  Filled 2019-06-28: qty 2

## 2019-06-28 MED ORDER — OXYCODONE-ACETAMINOPHEN 5-325 MG PO TABS
1.0000 | ORAL_TABLET | ORAL | Status: DC | PRN
  Administered 2019-06-28: 1 via ORAL
  Filled 2019-06-28: qty 2

## 2019-06-28 MED ORDER — DIAZEPAM 5 MG PO TABS
ORAL_TABLET | ORAL | Status: AC
  Filled 2019-06-28: qty 2

## 2019-06-28 MED ORDER — ONDANSETRON HCL 4 MG/2ML IJ SOLN: 4.0000 mg | Freq: Four times a day (QID) | INTRAMUSCULAR | Status: DC | PRN

## 2019-06-28 MED ORDER — LIDOCAINE HCL (PF) 1 % IJ SOLN
5.0000 mL | Freq: Once | INTRAMUSCULAR | Status: AC
  Administered 2019-06-28: 5 mL via INTRADERMAL

## 2019-06-28 MED ORDER — OXYCODONE-ACETAMINOPHEN 5-325 MG PO TABS
ORAL_TABLET | ORAL | Status: AC
  Filled 2019-06-28: qty 1

## 2019-06-28 MED ORDER — IOHEXOL 300 MG/ML  SOLN
10.0000 mL | Freq: Once | INTRAMUSCULAR | Status: AC | PRN
  Administered 2019-06-28: 10 mL via INTRATHECAL

## 2019-06-28 NOTE — Procedures (Addendum)
Vernon Ruiz is a 69 year old individual whose had disc herniation at L3-L4 on the right side.  He had a discectomy and unfortunately has had worsening pain and weakness in the right lower extremity with a complete foot drop.  Is been evaluated multiple times in the postoperative MRI demonstrated severe inflammation but no evidence of compression.  Myelogram post myelogram CAT scan is now being performed to obtain some dynamic imaging and better evaluate this process in his lumbar spine.  Pre op Dx: Lumbar radiculopathy Post op Dx: Same Procedure: total myelogram Surgeon: Carla Whilden Puncture level: L2-3 Fluid color: Clear colorless Injection: Iohexol 300, 5 mL Findings: Severe stenosis with possible right sided nerve root cut off,severe painful reaction to presence of dye in lumbar spine, therefore dye mobilized up to cervical spine reducing symptoms... in light of hx of myelopathy cervical imaging will also be obtained. further evaluation with CT scan

## 2019-06-28 NOTE — Discharge Instructions (Signed)
Myelogram and Lumbar Puncture Discharge Instructions ° °1. Go home and rest quietly for the next 24 hours.  It is important to lie flat for the next 24 hours.  Get up only to go to the restroom.  You may lie in the bed or on a couch on your back, your stomach, your left side or your right side.  You may have one pillow under your head.  You may have pillows between your knees while you are on your side or under your knees while you are on your back. ° °2. DO NOT drive today.  Recline the seat as far back as it will go, while still wearing your seat belt, on the way home. ° °3. You may get up to go to the bathroom as needed.  You may sit up for 10 minutes to eat.  You may resume your normal diet and medications unless otherwise indicated. ° °4. The incidence of headache, nausea, or vomiting is about 5% (one in 20 patients).  If you develop a headache, lie flat and drink plenty of fluids until the headache goes away.  Caffeinated beverages may be helpful.  If you develop severe nausea and vomiting or a headache that does not go away with flat bed rest, call Dr Elsner ° °5. You may resume normal activities after your 24 hours of bed rest is over; however, do not exert yourself strongly or do any heavy lifting tomorrow. ° °6. Call your physician for a follow-up appointment.  The results of your myelogram will be sent directly to your physician by the following day. ° °7. If you have any questions or if complications develop after you arrive home, please call Dr Elsner ° °Discharge instructions have been explained to the patient.  The patient, or the person responsible for the patient, fully understands these instructions. ° ° °

## 2019-06-28 NOTE — Progress Notes (Signed)
Discharge instructions given to patient and wife. Both verbalized understanding.

## 2019-06-29 DIAGNOSIS — M48062 Spinal stenosis, lumbar region with neurogenic claudication: Secondary | ICD-10-CM | POA: Diagnosis not present

## 2019-07-02 ENCOUNTER — Telehealth: Payer: Self-pay | Admitting: *Deleted

## 2019-07-02 ENCOUNTER — Other Ambulatory Visit: Payer: Self-pay | Admitting: Neurological Surgery

## 2019-07-02 DIAGNOSIS — I480 Paroxysmal atrial fibrillation: Secondary | ICD-10-CM

## 2019-07-02 MED ORDER — ENOXAPARIN SODIUM 120 MG/0.8ML ~~LOC~~ SOLN: 120.0000 mg | SUBCUTANEOUS | 0 refills | Status: DC

## 2019-07-02 NOTE — Telephone Encounter (Signed)
Pt takes Eliquis for afib with CHADS2VASc score of 6 (age, HTN, DM, CAD, stroke in Sept 2020). Renal function is normal. 3 day Eliquis hold will be required for spinal procedure and pt will need Lovenox bridging again.  Called pt to discuss, he is familiar with Lovenox injections from his last bridge a few months ago and prefers to review instructions on the phone. He reports he currently weighs 192 lbs (87kg), will dose Lovenox at 1.5mg /kg once daily and use Lovenox 120mg  dosing.  His procedure is at 7:30am (he needs to be there by 5:30am).  Pt will take Eliquis twice daily as usual with last dose on 4/2 in the AM. He will then follow the below instructions:  4/2 - Last dose of Eliquis the AM. Give 1 Lovenox 120mg  injection at 8pm. 4/3 - No Eliquis. Give 1 Lovenox injection at 8pm. 4/4 - No Eliquis. Give 1 Lovenox injection at 8pm. 4/5 - No Eliquis, no Lovenox. 4/6 - No Eliquis or Lovenox before procedure. Pt will clarify with MD performing procedure - ideally wish to resume Eliquis as soon as possible (4/6 in PM or 4/7 in AM).  Reviewed instructions on the phone twice, wife was also on call along with pt. Rx for Lovenox sent to pharmacy of choice. Pt advised to call with any questions or concerns.

## 2019-07-02 NOTE — Telephone Encounter (Signed)
   [facility] Pre-operative Risk Assessment    Request for surgical clearance:  1. What type of surgery is being performed? L1-2, L2-3, L3-4 ANTEROLATERAL LUMBAR INTERBODY FUSION WITH L1-L4 POSTERIOR FIXATION  2. When is this surgery scheduled? 07/17/19   3. What type of clearance is required (medical clearance vs. Pharmacy clearance to hold med vs. Both)? BOTH  4. Are there any medications that need to be held prior to surgery and how long? ELIQUIS   5. Practice name and name of physician performing surgery? [facility]; DR. Kristeen Miss   6. What is your office phone number [phone]    7.   What is your office fax number [phone]  8.   Anesthesia type (None, local, MAC, general) ? GENERAL   Vernon Ruiz 07/02/2019, 3:02 PM  _________________________________________________________________   (provider comments below)

## 2019-07-02 NOTE — Telephone Encounter (Signed)
Pharmacy, can you please comment on how long patient can hold Eliquis prior to lumbar spine procedure? It looks like last time he needed a Lovenox bridge.   Thank you!

## 2019-07-03 NOTE — Telephone Encounter (Signed)
   Primary Cardiologist: [name], MD  Chart reviewed as part of pre-operative protocol coverage. Patient last seen by Dr. Excell Seltzer for a virtual visit on 03/29/2019 at which time he complained of dizziness that was consistent with vertigo and chronic leg swelling but was otherwise doing well. Patient was contacted today for further pre-op evaluation and reports doing well from a cardiac standpoint. He continues to have some intermittent dizziness related to vertigo but states Meclizine has helped. He also reports some continued lower extremity edema but states it has improved some. No chest pain, shortness of breath, palpitations, syncope, orthopnea or PND. Activity is limited by back pain but he is able to complete >4.0 METS.    Given past medical history and time since last visit, based on ACC/AHA guidelines, Vernon Ruiz would be at acceptable risk for the planned procedure without further cardiovascular testing.   Per Pharmacy, "3 day Eliquis hold will be required for spinal procedure and pt will need Lovenox bridging again." Our Pharmacist called the patient and gave detailed instructions for Lovenox bridge (see note below).  I will route this recommendation to the requesting party via Epic fax function and remove from pre-op pool.  Please call with questions.  Corrin Parker, PA-C 07/03/2019, 9:22 AM

## 2019-07-05 DIAGNOSIS — I10 Essential (primary) hypertension: Secondary | ICD-10-CM | POA: Diagnosis not present

## 2019-07-05 DIAGNOSIS — M47816 Spondylosis without myelopathy or radiculopathy, lumbar region: Secondary | ICD-10-CM | POA: Diagnosis not present

## 2019-07-05 DIAGNOSIS — I4819 Other persistent atrial fibrillation: Secondary | ICD-10-CM | POA: Diagnosis not present

## 2019-07-05 DIAGNOSIS — E1169 Type 2 diabetes mellitus with other specified complication: Secondary | ICD-10-CM | POA: Diagnosis not present

## 2019-07-05 DIAGNOSIS — Z23 Encounter for immunization: Secondary | ICD-10-CM | POA: Diagnosis not present

## 2019-07-05 DIAGNOSIS — E785 Hyperlipidemia, unspecified: Secondary | ICD-10-CM | POA: Diagnosis not present

## 2019-07-12 ENCOUNTER — Other Ambulatory Visit (HOSPITAL_COMMUNITY): Payer: Medicare Other

## 2019-07-12 ENCOUNTER — Ambulatory Visit (HOSPITAL_COMMUNITY): Payer: Medicare Other

## 2019-07-12 NOTE — Pre-Procedure Instructions (Signed)
[facility] #[id number] - [place], [place] - [address] [place] [address] Phone: [phone] Fax: [phone]    Your procedure is scheduled on Tuesday, April 6th. Report to [facility] Main Entrance "A" at 05:30 A.M., and check in at the Admitting office.  Call this number if you have problems the morning of surgery:  [phone]  Call [phone] if you have any questions prior to your surgery date Monday-Friday 8am-4pm.   Remember:  Do not eat or drink after midnight the night before your surgery.   Take these medicines the morning of surgery with A SIP OF WATER : amLODipine (NORVASC) metoprolol tartrate (LOPRESSOR) rosuvastatin (CRESTOR)  IF NEEDED: acetaminophen (TYLENOL)  famotidine (PEPCID) meclizine (ANTIVERT) omeprazole (PRILOSEC)  *ELIQUIS and LOVENOX Instructions*:  04/02 - Last dose of Eliquis the AM. Give 1 Lovenox 120mg  injection at 8pm. 04/03 - No Eliquis. Give 1 Lovenox injection at 8pm. 04/04 - No Eliquis. Give 1 Lovenox injection at 8pm. 04/05 - No Eliquis, no Lovenox. 04/06 - No Eliquis or Lovenox before procedure. Pt will clarify with MD performing procedure - ideally wish to resume Eliquis as soon as possible (4/6 in PM or 4/7 in AM).  As of today, STOP taking any Aspirin (unless otherwise instructed by your surgeon) and Aspirin containing products, Aleve, Naproxen, Ibuprofen, Motrin, Advil, Goody's, BC's, all herbal medications, fish oil, and all vitamins.  WHAT DO I DO ABOUT MY DIABETES MEDICATION?   THE DAY BEFORE SURGERY: - Do not take empagliflozin (JARDIANCE).   . THE NIGHT/ EVENING BEFORE SURGERY: - Do not take PM dose of glimepiride (AMARYL)   . THE MORNING OF SURGERY: - Only take 17 units of TOUJEO SOLOSTAR. (if normally taken in the morning)  - Do not take empagliflozin (JARDIANCE), glimepiride (AMARYL), or metFORMIN (GLUCOPHAGE-XR).  6/7 The day of surgery, do  not take other diabetes injectables,Trulicity (dulaglutide).  HOW TO MANAGE YOUR DIABETES BEFORE AND AFTER SURGERY  Why is it important to control my blood sugar before and after surgery? . Improving blood sugar levels before and after surgery helps healing and can limit problems. . A way of improving blood sugar control is eating a healthy diet by: o  Eating less sugar and carbohydrates o  Increasing activity/exercise o  Talking with your doctor about reaching your blood sugar goals . High blood sugars (greater than 180 mg/dL) can raise your risk of infections and slow your recovery, so you will need to focus on controlling your diabetes during the weeks before surgery. . Make sure that the doctor who takes care of your diabetes knows about your planned surgery including the date and location.  How do I manage my blood sugar before surgery? . Check your blood sugar at least 4 times a day, starting 2 days before surgery, to make sure that the level is not too high or low. . Check your blood sugar the morning of your surgery when you wake up and every 2 hours until you get to the Short Stay unit. o If your blood sugar is less than 70 mg/dL, you will need to treat for low blood sugar: - Do not take insulin. - Treat a low blood sugar (less than 70 mg/dL) with  cup of clear juice (cranberry or apple), 4 glucose tablets, OR glucose gel. - Recheck blood sugar in 15 minutes after treatment (to make sure it is greater than 70 mg/dL). If  your blood sugar is not greater than 70 mg/dL on recheck, call [phone] for further instructions. . Report your blood sugar to the short stay nurse when you get to Short Stay.  . If you are admitted to the hospital after surgery: o Your blood sugar will be checked by the staff and you will probably be given insulin after surgery (instead of oral diabetes medicines) to make sure you have good blood sugar levels. o The goal for blood sugar control after surgery is  80-180 mg/dL.              Do not wear jewelry.            Do not wear lotions, powders, colognes, or deodorant.            Men may shave face and neck.            Do not bring valuables to the hospital.            [facility] is not responsible for any belongings or valuables.  Do NOT Smoke (Tobacco/Vapping) or drink Alcohol 24 hours prior to your procedure.  If you use a CPAP at night, you may bring all equipment for your overnight stay.   Contacts, glasses, dentures or bridgework may not be worn into surgery.      For patients admitted to the hospital, discharge time will be determined by your treatment team.   Patients discharged the day of surgery will not be allowed to drive home, and someone needs to stay with them for 24 hours.  Special instructions:   [facility]- Preparing For Surgery  Before surgery, you can play an important role. Because skin is not sterile, your skin needs to be as free of germs as possible. You can reduce the number of germs on your skin by washing with CHG (chlorahexidine gluconate) Soap before surgery.  CHG is an antiseptic cleaner which kills germs and bonds with the skin to continue killing germs even after washing.    Oral Hygiene is also important to reduce your risk of infection.  Remember - BRUSH YOUR TEETH THE MORNING OF SURGERY WITH YOUR REGULAR TOOTHPASTE  Please do not use if you have an allergy to CHG or antibacterial soaps. If your skin becomes reddened/irritated stop using the CHG.  Do not shave (including legs and underarms) for at least 48 hours prior to first CHG shower. It is OK to shave your face.  Please follow these instructions carefully.   1. Shower the NIGHT BEFORE SURGERY and the MORNING OF SURGERY with CHG Soap.   2. If you chose to wash your hair, wash your hair first as usual with your normal shampoo.  3. After you shampoo, rinse your hair and body thoroughly to remove the shampoo.  4. Use CHG as you would any other  liquid soap. You can apply CHG directly to the skin and wash gently with a scrungie or a clean washcloth.   5. Apply the CHG Soap to your body ONLY FROM THE NECK DOWN.  Do not use on open wounds or open sores. Avoid contact with your eyes, ears, mouth and genitals (private parts). Wash Face and genitals (private parts)  with your normal soap.   6. Wash thoroughly, paying special attention to the area where your surgery will be performed.  7. Thoroughly rinse your body with warm water from the neck down.  8. DO NOT shower/wash with your normal soap after using and rinsing off  the CHG Soap.  9. Pat yourself dry with a CLEAN TOWEL.  10. Wear CLEAN PAJAMAS to bed the night before surgery, wear comfortable clothes the morning of surgery  11. Place CLEAN SHEETS on your bed the night of your first shower and DO NOT SLEEP WITH PETS.  Day of Surgery: Do not apply any deodorants/lotions.  Please wear clean clothes to the hospital/surgery center.   Remember to brush your teeth WITH YOUR REGULAR TOOTHPASTE.   Please read over the following fact sheets that you were given.

## 2019-07-12 NOTE — Pre-Procedure Instructions (Signed)
[facility] #[id number] - [place], [place] - [address] [place] [address] Phone: [phone] Fax: [phone]      Your procedure is scheduled on Tuesday, April 6th, from 07:30 AM to 1:28 PM.  Report to [facility] Main Entrance "A" at 05:30 A.M., and check in at the Admitting office.  Call this number if you have problems the morning of surgery:  [phone]  Call [phone] if you have any questions prior to your surgery date Monday-Friday 8am-4pm.    Remember:  Do not eat or drink after midnight the night before your surgery.    Take these medicines the morning of surgery with A SIP OF WATER : amLODipine (NORVASC) metoprolol tartrate (LOPRESSOR) rosuvastatin (CRESTOR)  IF NEEDED: acetaminophen (TYLENOL)  famotidine (PEPCID) meclizine (ANTIVERT) omeprazole (PRILOSEC)   *ELIQUIS and LOVENOX Instructions*:  04/02 - Last dose of Eliquis the AM. Give 1 Lovenox 120mg  injection at 8pm. 04/03 - No Eliquis. Give 1 Lovenox injection at 8pm. 04/04 - No Eliquis. Give 1 Lovenox injection at 8pm. 04/05 - No Eliquis, no Lovenox. 04/06 - No Eliquis or Lovenox before procedure. Pt will clarify with MD performing procedure - ideally wish to resume Eliquis as soon as possible (4/6 in PM or 4/7 in AM).  As of today, STOP taking any Aspirin (unless otherwise instructed by your surgeon) and Aspirin containing products, Aleve, Naproxen, Ibuprofen, Motrin, Advil, Goody's, BC's, all herbal medications, fish oil, and all vitamins.   WHAT DO I DO ABOUT MY DIABETES MEDICATION?   THE DAY BEFORE SURGERY: - Do not take empagliflozin (JARDIANCE). - Take usual AM dose of metFORMIN (GLUCOPHAGE-XR) - Take usual AM dose of glimepiride (AMARYL) - Take usual 35 units of TOUJEO SOLOSTAR   . THE NIGHT/ EVENING BEFORE SURGERY: - Do not take glimepiride (AMARYL) - Do not take PM dose of glimepiride (AMARYL) - Only take 17.5  units of TOUJEO SOLOSTAR - Take usual PM dose of metFORMIN (GLUCOPHAGE-XR)  . THE MORNING OF SURGERY: - Only take 17.5 units of TOUJEO SOLOSTAR. - Do not take empagliflozin (JARDIANCE), glimepiride (AMARYL), or metFORMIN (GLUCOPHAGE-XR).  6/7 The day of surgery, do not take other diabetes injectables,Trulicity (dulaglutide).    HOW TO MANAGE YOUR DIABETES BEFORE AND AFTER SURGERY  Why is it important to control my blood sugar before and after surgery? . Improving blood sugar levels before and after surgery helps healing and can limit problems. . A way of improving blood sugar control is eating a healthy diet by: o  Eating less sugar and carbohydrates o  Increasing activity/exercise o  Talking with your doctor about reaching your blood sugar goals . High blood sugars (greater than 180 mg/dL) can raise your risk of infections and slow your recovery, so you will need to focus on controlling your diabetes during the weeks before surgery. . Make sure that the doctor who takes care of your diabetes knows about your planned surgery including the date and location.  How do I manage my blood sugar before surgery? . Check your blood sugar at least 4 times a day, starting 2 days before surgery, to make sure that the level is not too high or low. . Check your blood sugar the morning of your surgery when you wake up and every 2 hours until you get to the Short Stay unit. o If your blood sugar is less than 70 mg/dL, you will need to treat  for low blood sugar: - Do not take insulin. - Treat a low blood sugar (less than 70 mg/dL) with  cup of clear juice (cranberry or apple), 4 glucose tablets, OR glucose gel. - Recheck blood sugar in 15 minutes after treatment (to make sure it is greater than 70 mg/dL). If your blood sugar is not greater than 70 mg/dL on recheck, call [phone] for further instructions. . Report your blood sugar to the short stay nurse when you get to Short Stay.  . If you are  admitted to the hospital after surgery: o Your blood sugar will be checked by the staff and you will probably be given insulin after surgery (instead of oral diabetes medicines) to make sure you have good blood sugar levels. o The goal for blood sugar control after surgery is 80-180 mg/dL.                Do not wear jewelry.            Do not wear lotions, powders, colognes, or deodorant.            Men may shave face and neck.            Do not bring valuables to the hospital.            [facility] is not responsible for any belongings or valuables.  Do NOT Smoke (Tobacco/Vapping) or drink Alcohol 24 hours prior to your procedure.  If you use a CPAP at night, you may bring all equipment for your overnight stay.   Contacts, glasses, dentures or bridgework may not be worn into surgery.      For patients admitted to the hospital, discharge time will be determined by your treatment team.   Patients discharged the day of surgery will not be allowed to drive home, and someone needs to stay with them for 24 hours.    Special instructions:   [facility]- Preparing For Surgery  Before surgery, you can play an important role. Because skin is not sterile, your skin needs to be as free of germs as possible. You can reduce the number of germs on your skin by washing with CHG (chlorahexidine gluconate) Soap before surgery.  CHG is an antiseptic cleaner which kills germs and bonds with the skin to continue killing germs even after washing.    Oral Hygiene is also important to reduce your risk of infection.  Remember - BRUSH YOUR TEETH THE MORNING OF SURGERY WITH YOUR REGULAR TOOTHPASTE  Please do not use if you have an allergy to CHG or antibacterial soaps. If your skin becomes reddened/irritated stop using the CHG.  Do not shave (including legs and underarms) for at least 48 hours prior to first CHG shower. It is OK to shave your face.  Please follow these instructions carefully.   1. Shower  the NIGHT BEFORE SURGERY and the MORNING OF SURGERY with CHG Soap.   2. If you chose to wash your hair, wash your hair first as usual with your normal shampoo.  3. After you shampoo, rinse your hair and body thoroughly to remove the shampoo.  4. Use CHG as you would any other liquid soap. You can apply CHG directly to the skin and wash gently with a scrungie or a clean washcloth.   5. Apply the CHG Soap to your body ONLY FROM THE NECK DOWN.  Do not use on open wounds or open sores. Avoid contact with your eyes, ears, mouth and genitals (  private parts). Wash Face and genitals (private parts)  with your normal soap.   6. Wash thoroughly, paying special attention to the area where your surgery will be performed.  7. Thoroughly rinse your body with warm water from the neck down.  8. DO NOT shower/wash with your normal soap after using and rinsing off the CHG Soap.  9. Pat yourself dry with a CLEAN TOWEL.  10. Wear CLEAN PAJAMAS to bed the night before surgery, wear comfortable clothes the morning of surgery  11. Place CLEAN SHEETS on your bed the night of your first shower and DO NOT SLEEP WITH PETS.   Day of Surgery:   Do not apply any deodorants/lotions.  Please wear clean clothes to the hospital/surgery center.   Remember to brush your teeth WITH YOUR REGULAR TOOTHPASTE.   Please read over the following fact sheets that you were given.

## 2019-07-13 ENCOUNTER — Other Ambulatory Visit (HOSPITAL_COMMUNITY)
Admission: RE | Admit: 2019-07-13 | Discharge: 2019-07-13 | Disposition: A | Discharge status: Home / Self Care | Payer: Medicare Other | Source: Ambulatory Visit | Attending: Neurological Surgery | Admitting: Neurological Surgery

## 2019-07-13 ENCOUNTER — Encounter (HOSPITAL_COMMUNITY): Payer: Self-pay

## 2019-07-13 ENCOUNTER — Other Ambulatory Visit: Payer: Self-pay

## 2019-07-13 ENCOUNTER — Encounter (HOSPITAL_COMMUNITY)
Admission: RE | Admit: 2019-07-13 | Discharge: 2019-07-13 | Disposition: A | Discharge status: Home / Self Care | Payer: Medicare Other | Source: Ambulatory Visit | Attending: Neurological Surgery | Admitting: Neurological Surgery

## 2019-07-13 DIAGNOSIS — G4733 Obstructive sleep apnea (adult) (pediatric): Secondary | ICD-10-CM | POA: Insufficient documentation

## 2019-07-13 DIAGNOSIS — I10 Essential (primary) hypertension: Secondary | ICD-10-CM | POA: Insufficient documentation

## 2019-07-13 DIAGNOSIS — Z20822 Contact with and (suspected) exposure to covid-19: Secondary | ICD-10-CM | POA: Insufficient documentation

## 2019-07-13 DIAGNOSIS — I4819 Other persistent atrial fibrillation: Secondary | ICD-10-CM | POA: Diagnosis not present

## 2019-07-13 DIAGNOSIS — I2510 Atherosclerotic heart disease of native coronary artery without angina pectoris: Secondary | ICD-10-CM | POA: Insufficient documentation

## 2019-07-13 DIAGNOSIS — Z7901 Long term (current) use of anticoagulants: Secondary | ICD-10-CM | POA: Diagnosis not present

## 2019-07-13 DIAGNOSIS — Z7984 Long term (current) use of oral hypoglycemic drugs: Secondary | ICD-10-CM | POA: Diagnosis not present

## 2019-07-13 DIAGNOSIS — Z953 Presence of xenogenic heart valve: Secondary | ICD-10-CM | POA: Insufficient documentation

## 2019-07-13 DIAGNOSIS — Z79899 Other long term (current) drug therapy: Secondary | ICD-10-CM | POA: Diagnosis not present

## 2019-07-13 DIAGNOSIS — Z87891 Personal history of nicotine dependence: Secondary | ICD-10-CM | POA: Insufficient documentation

## 2019-07-13 DIAGNOSIS — Z01818 Encounter for other preprocedural examination: Secondary | ICD-10-CM | POA: Diagnosis not present

## 2019-07-13 DIAGNOSIS — E119 Type 2 diabetes mellitus without complications: Secondary | ICD-10-CM | POA: Insufficient documentation

## 2019-07-13 DIAGNOSIS — M48062 Spinal stenosis, lumbar region with neurogenic claudication: Secondary | ICD-10-CM | POA: Insufficient documentation

## 2019-07-13 HISTORY — DX: Unilateral inguinal hernia, without obstruction or gangrene, not specified as recurrent: K40.90

## 2019-07-13 LAB — CBC
HCT: 48.8 % (ref 39.0–52.0)
Hemoglobin: 15.3 g/dL (ref 13.0–17.0)
MCH: 26.2 pg (ref 26.0–34.0)
MCHC: 31.4 g/dL (ref 30.0–36.0)
MCV: 83.7 fL (ref 80.0–100.0)
Platelets: 276 10*3/uL (ref 150–400)
RBC: 5.83 MIL/uL — ABNORMAL HIGH (ref 4.22–5.81)
RDW: 14.7 % (ref 11.5–15.5)
WBC: 10 10*3/uL (ref 4.0–10.5)
nRBC: 0 % (ref 0.0–0.2)

## 2019-07-13 LAB — BASIC METABOLIC PANEL
Anion gap: 13 (ref 5–15)
BUN: 22 mg/dL (ref 8–23)
CO2: 22 mmol/L (ref 22–32)
Calcium: 9.7 mg/dL (ref 8.9–10.3)
Chloride: 103 mmol/L (ref 98–111)
Creatinine, Ser: 0.89 mg/dL (ref 0.61–1.24)
GFR calc Af Amer: >60 mL/min (ref >60)
GFR calc non Af Amer: >60 mL/min (ref >60)
Glucose, Bld: 93 mg/dL (ref 70–99)
Potassium: 4.2 mmol/L (ref 3.5–5.1)
Sodium: 138 mmol/L (ref 135–145)

## 2019-07-13 LAB — TYPE AND SCREEN
ABO/RH(D): AB POS
Antibody Screen: NEGATIVE

## 2019-07-13 LAB — GLUCOSE, CAPILLARY: Glucose-Capillary: 78 mg/dL (ref 70–99)

## 2019-07-13 LAB — SURGICAL PCR SCREEN
MRSA, PCR: NEGATIVE
Staphylococcus aureus: NEGATIVE

## 2019-07-13 LAB — SARS CORONAVIRUS 2 (TAT 6-24 HRS): SARS Coronavirus 2: NEGATIVE

## 2019-07-13 LAB — HEMOGLOBIN A1C
Hgb A1c MFr Bld: 7.7 % — ABNORMAL HIGH (ref 4.8–5.6)
Mean Plasma Glucose: 174.29 mg/dL

## 2019-07-13 NOTE — Pre-Procedure Instructions (Signed)
PCP - Dr. [name] Cardiologist - Dr. [name] Endocrinologist - Dr. Alfonso Ramus?? - patient not sure of spelling of the doctor's name  PPM/ICD - denies  Chest x-ray - N/A EKG - 03/14/2019 Stress Test - 2000 ECHO - 12/13/2018 Cardiac Cath - 03/10/16  Sleep Study - denies CPAP - N/A  Fasting Blood Sugar - 88 - 160 Checks Blood Sugar 1 time a day  Blood Thinner Instructions: Eliquis -last dose 07/13/2019. Lovenox bridge. Last dose to be 07/15/2019. Aspirin Instructions: N/A  ERAS Protcol - No  COVID TEST- Scheduled today 07/13/2019 after PAT appointment. Patient verbalized understanding of self-quarantine instructions, appointment time and place.  Anesthesia review: YES, cardiac history  Patient denies shortness of breath, fever, cough and chest pain at PAT appointment  All instructions explained to the patient, with a verbal understanding of the material. Patient agrees to go over the instructions while at home for a better understanding. Patient also instructed to self quarantine after being tested for COVID-19. The opportunity to ask questions was provided.

## 2019-07-16 ENCOUNTER — Encounter (HOSPITAL_COMMUNITY): Payer: Self-pay

## 2019-07-16 NOTE — Anesthesia Preprocedure Evaluation (Addendum)
Anesthesia Evaluation  Patient identified by MRN, date of birth, ID band Patient awake    Reviewed: Allergy & Precautions, NPO status , Patient's Chart, lab work & pertinent test results, reviewed documented beta blocker date and time   Airway Mallampati: II  TM Distance: >3 FB Neck ROM: Full    Dental  (+) Teeth Intact, Dental Advisory Given   Pulmonary former smoker,    breath sounds clear to auscultation       Cardiovascular hypertension, + CAD  + Valvular Problems/Murmurs  Rhythm:Regular Rate:Normal     Neuro/Psych    GI/Hepatic GERD  ,  Endo/Other  diabetes  Renal/GU      Musculoskeletal   Abdominal   Peds  Hematology   Anesthesia Other Findings   Reproductive/Obstetrics                          Anesthesia Physical Anesthesia Plan  ASA: III  Anesthesia Plan: General   Post-op Pain Management:    Induction: Intravenous  PONV Risk Score and Plan: 2 and Ondansetron, Dexamethasone and Midazolam  Airway Management Planned: Oral ETT  Additional Equipment: Arterial line  Intra-op Plan:   Post-operative Plan: Possible Post-op intubation/ventilation  Informed Consent: I have reviewed the patients History and Physical, chart, labs and discussed the procedure including the risks, benefits and alternatives for the proposed anesthesia with the patient or authorized representative who has indicated his/her understanding and acceptance.     Dental advisory given  Plan Discussed with: Anesthesiologist, CRNA and Surgeon  Anesthesia Plan Comments: (See APP note by Joslyn Hy, FNP)      Anesthesia Quick Evaluation

## 2019-07-16 NOTE — Progress Notes (Signed)
Anesthesia Chart Review:   Case: [id number] Date/Time: 07/17/19 0715   Procedures:      Lumbar 1-2 Lumbar 2-3 Lumbar 3-4 Anterolateral lumbar interbody fusion with posterior fixation Lumbar 1 to Lumbar 4 with Mazor (N/A )     APPLICATION OF ROBOTIC ASSISTANCE FOR SPINAL PROCEDURE (N/A )     Posterior fixation Lumbar 1 to Lumbar 4 (N/A )   Anesthesia type: General   Pre-op diagnosis: Lumbar stenosis with neurogenic claudication   Location: [facility] OR ROOM 19 / [place] OR   Surgeons: Kristeen Miss, MD      DISCUSSION:  Pt is 69 years old with hx CAD (nonobstructive by 2017 cath), aortic stenosis (s/p bioprosthetic AVR 10/04/16), persistent afib (s/p clipping of LA appendage 10/04/16(, CVA (12/12/18), HTN, DM, OSA, carotid artery disease (mild by 12/13/18 dopplers). Former smoker (quit 09/16/16)  - Hospitalized 12/2-12/6/20 for COVID; received decadron, remdesivir, and convalescent plasma.   - Hospitalized 9/9-06/17/14 for acute embolic stroke  Last dose eliquis 07/13/19; lovenox bridge x3 days through 07/15/19.    VS: BP (!) 145/86 (BP Location: Right Arm)   Pulse 68   Temp 37.1 C (Oral)   Resp 17   Ht 5\' 5"  (1.651 m)   Wt 89.2 kg   SpO2 98%   BMI 32.73 kg/m    PROVIDERS: - PCP is Hulan Fess, MD  - Cardiologist is Sherren Mocha, MD. Last virtual visit 03/29/19. Cleared for surgery at acceptable risk by Sande Rives, PA on 07/03/19.    LABS: Labs reviewed: Acceptable for surgery.  - PT/INR will be obtained day of surgery.   (all labs ordered are listed, but only abnormal results are displayed)  Labs Reviewed  HEMOGLOBIN A1C - Abnormal; Notable for the following components:      Result Value   Hgb A1c MFr Bld 7.7 (*)    All other components within normal limits  CBC - Abnormal; Notable for the following components:   RBC 5.83 (*)    All other components within normal limits  SURGICAL PCR SCREEN  GLUCOSE, CAPILLARY  BASIC METABOLIC PANEL  TYPE AND SCREEN     IMAGES:  1 view CXR  03/16/19: Worsened airspace disease in the left lung base worrisome for pneumonia.   EKG 03/14/19:  Sinus rhythm with PACs. Nonspecific ST abnormality.    CV:  Lower extremity doppler 12/27/18:  - Right: Resting right ankle-brachial index is within normal range. No evidence of significant right lower extremity arterial disease. The right toe-brachial index is normal.  - Left: Resting left ankle-brachial index is within normal range. No evidence of significant left lower extremity arterial disease. The left toe-brachial index is mildly abnormal.    Carotid duplex 12/13/18:  - Right Carotid: Velocities in the right ICA are consistent with a 1-39% stenosis.  - Left Carotid: Velocities in the left ICA are consistent with a 1-39% stenosis.  - Vertebrals: Bilateral vertebral arteries demonstrate antegrade flow.  - Subclavians: Normal flow hemodynamics were seen in bilateral subclavian arteries.    Echo 12/13/18:  1. The LV has normal systolic function with an EF of 60-65%. The cavity size was normal. There is mildly increased LV wall thickness. LV diastolic Doppler  parameters are consistent with pseudonormalization.  2. The RV has normal systolic function. The cavity was mildly enlarged. There is no increase in RV wall thickness. RV systolic pressure could not be assessed.  3. Left atrial size was mildly dilated.  4. Right atrial size was mildly dilated.  5.  Poorly visualized AV bioprosthesis. There is no perivalvular AI. The mean AV gradient is stable at 14.65mmHg.  6. The aorta is normal unless otherwise noted.  7. The inferior vena cava was dilated in size with >50% respiratory  variability.    R/L Cardiac cath 03/10/16:  1. Diffuse nonobstructive CAD 2. Moderately severe aortic stenosis 3. Normal right heart hemodynamics/normal LVEDP     Past Medical History:  Diagnosis Date  . Aortic stenosis    s/p AVR using a 79mm Edwards Magna-Ease pericardial valve 10/04/16  . Coronary  artery disease   . Diabetes mellitus without complication (HCC)   . GERD (gastroesophageal reflux disease)   . Heart murmur   . Hypertension   . Inguinal hernia 1970s  . Obstructive sleep apnea    positive from a home test, still needs to get another study done  . Persistent atrial fibrillation (HCC)    s/p clipping of LA appendage 10/04/16  . Right carotid bruit 12/12/2018  . Right foot drop 11/07/2018    Past Surgical History:  Procedure Laterality Date  . AORTIC VALVE REPLACEMENT N/A 10/04/2016   Procedure: AORTIC VALVE REPLACEMENT (AVR);  Surgeon: [name], MD;  Location: [facility] OR;  Service: Open Heart Surgery;  Laterality: N/A;  . APPENDECTOMY    . BACK SURGERY    . CARDIAC CATHETERIZATION N/A 03/10/2016   Procedure: Right/Left Heart Cath and Coronary Angiography;  Surgeon: [name], MD;  Location: [facility] INVASIVE CV LAB;  Service: Cardiovascular;  Laterality: N/A;  . CERVICAL SPINE SURGERY     07/13/2019: per patient 2020  . CLIPPING OF ATRIAL APPENDAGE N/A 10/04/2016   Procedure: CLIPPING OF ATRIAL APPENDAGE;  Surgeon: [name], MD;  Location: [facility] OR;  Service: Open Heart Surgery;  Laterality: N/A;  . COLONOSCOPY    . EYE SURGERY Right    growth removed from eye  . HERNIA REPAIR Bilateral    inguinal  . TEE WITHOUT CARDIOVERSION N/A 10/04/2016   Procedure: TRANSESOPHAGEAL ECHOCARDIOGRAM (TEE);  Surgeon: [name], MD;  Location: [facility] OR;  Service: Open Heart Surgery;  Laterality: N/A;    MEDICATIONS: . acetaminophen (TYLENOL) 325 MG tablet  . amLODipine (NORVASC) 5 MG tablet  . apixaban (ELIQUIS) 5 MG TABS tablet  . B Complex-C (B-COMPLEX WITH VITAMIN C) tablet  . Cholecalciferol (VITAMIN D3) 250 MCG (10000 UT) capsule  . Dulaglutide (TRULICITY) 1.5 MG/0.5ML SOPN  . empagliflozin (JARDIANCE) 25 MG TABS tablet  . enoxaparin (LOVENOX) 120 MG/0.8ML injection  . famotidine (PEPCID) 20 MG tablet  . furosemide (LASIX) 20 MG tablet  . glimepiride (AMARYL) 4 MG  tablet  . losartan (COZAAR) 100 MG tablet  . meclizine (ANTIVERT) 25 MG tablet  . metFORMIN (GLUCOPHAGE-XR) 500 MG 24 hr tablet  . metoprolol tartrate (LOPRESSOR) 25 MG tablet  . omeprazole (PRILOSEC) 20 MG capsule  . rosuvastatin (CRESTOR) 20 MG tablet  . TOUJEO SOLOSTAR 300 UNIT/ML Solostar Pen  . vitamin C (ASCORBIC ACID) 500 MG tablet  . zinc gluconate 50 MG tablet   No current facility-administered medications for this encounter.   Last dose eliquis 07/13/19; lovenox bridge x3 days through 07/15/19.    If labs acceptable day of surgery, I anticipate pt can proceed with surgery as scheduled.  Rica Mast, FNP-BC [facility] Short Stay Surgical Center/Anesthesiology Phone: [phone] 07/16/2019 10:13 AM

## 2019-07-17 ENCOUNTER — Inpatient Hospital Stay (HOSPITAL_COMMUNITY): Payer: Medicare Other | Admitting: Certified Registered"

## 2019-07-17 ENCOUNTER — Other Ambulatory Visit: Payer: Self-pay

## 2019-07-17 ENCOUNTER — Inpatient Hospital Stay (HOSPITAL_COMMUNITY): Payer: Medicare Other

## 2019-07-17 ENCOUNTER — Inpatient Hospital Stay (HOSPITAL_COMMUNITY): Payer: Medicare Other | Admitting: Emergency Medicine

## 2019-07-17 ENCOUNTER — Encounter (HOSPITAL_COMMUNITY)
Admission: RE | Disposition: A | Discharge status: Home Health | Payer: Self-pay | Source: Home / Self Care | Attending: Neurological Surgery

## 2019-07-17 ENCOUNTER — Inpatient Hospital Stay (HOSPITAL_COMMUNITY)
Admission: RE | Admit: 2019-07-17 | Discharge: 2019-07-20 | DRG: 454 | Disposition: A | Discharge status: Home Health | Payer: Medicare Other | Attending: Neurological Surgery | Admitting: Neurological Surgery

## 2019-07-17 ENCOUNTER — Encounter (HOSPITAL_COMMUNITY): Payer: Self-pay | Admitting: Neurological Surgery

## 2019-07-17 DIAGNOSIS — Z8673 Personal history of transient ischemic attack (TIA), and cerebral infarction without residual deficits: Secondary | ICD-10-CM | POA: Diagnosis not present

## 2019-07-17 DIAGNOSIS — M21371 Foot drop, right foot: Secondary | ICD-10-CM | POA: Diagnosis present

## 2019-07-17 DIAGNOSIS — Z87891 Personal history of nicotine dependence: Secondary | ICD-10-CM | POA: Diagnosis not present

## 2019-07-17 DIAGNOSIS — G4733 Obstructive sleep apnea (adult) (pediatric): Secondary | ICD-10-CM | POA: Diagnosis present

## 2019-07-17 DIAGNOSIS — Z794 Long term (current) use of insulin: Secondary | ICD-10-CM

## 2019-07-17 DIAGNOSIS — I4819 Other persistent atrial fibrillation: Secondary | ICD-10-CM | POA: Diagnosis present

## 2019-07-17 DIAGNOSIS — Z880 Allergy status to penicillin: Secondary | ICD-10-CM | POA: Diagnosis not present

## 2019-07-17 DIAGNOSIS — I2510 Atherosclerotic heart disease of native coronary artery without angina pectoris: Secondary | ICD-10-CM | POA: Diagnosis present

## 2019-07-17 DIAGNOSIS — Z882 Allergy status to sulfonamides status: Secondary | ICD-10-CM

## 2019-07-17 DIAGNOSIS — Z9049 Acquired absence of other specified parts of digestive tract: Secondary | ICD-10-CM

## 2019-07-17 DIAGNOSIS — Z981 Arthrodesis status: Secondary | ICD-10-CM | POA: Diagnosis not present

## 2019-07-17 DIAGNOSIS — M4726 Other spondylosis with radiculopathy, lumbar region: Secondary | ICD-10-CM | POA: Diagnosis present

## 2019-07-17 DIAGNOSIS — E119 Type 2 diabetes mellitus without complications: Secondary | ICD-10-CM | POA: Diagnosis present

## 2019-07-17 DIAGNOSIS — M5416 Radiculopathy, lumbar region: Secondary | ICD-10-CM | POA: Diagnosis not present

## 2019-07-17 DIAGNOSIS — Z79899 Other long term (current) drug therapy: Secondary | ICD-10-CM

## 2019-07-17 DIAGNOSIS — K219 Gastro-esophageal reflux disease without esophagitis: Secondary | ICD-10-CM | POA: Diagnosis present

## 2019-07-17 DIAGNOSIS — Z952 Presence of prosthetic heart valve: Secondary | ICD-10-CM | POA: Diagnosis not present

## 2019-07-17 DIAGNOSIS — Z419 Encounter for procedure for purposes other than remedying health state, unspecified: Secondary | ICD-10-CM

## 2019-07-17 DIAGNOSIS — Z7901 Long term (current) use of anticoagulants: Secondary | ICD-10-CM | POA: Diagnosis not present

## 2019-07-17 DIAGNOSIS — I10 Essential (primary) hypertension: Secondary | ICD-10-CM | POA: Diagnosis present

## 2019-07-17 DIAGNOSIS — M48062 Spinal stenosis, lumbar region with neurogenic claudication: Secondary | ICD-10-CM | POA: Diagnosis present

## 2019-07-17 HISTORY — PX: LUMBAR PERCUTANEOUS PEDICLE SCREW 3 LEVEL: SHX5562

## 2019-07-17 HISTORY — PX: ANTERIOR LAT LUMBAR FUSION: SHX1168

## 2019-07-17 HISTORY — PX: APPLICATION OF ROBOTIC ASSISTANCE FOR SPINAL PROCEDURE: SHX6753

## 2019-07-17 LAB — GLUCOSE, CAPILLARY
Glucose-Capillary: 157 mg/dL — ABNORMAL HIGH (ref 70–99)
Glucose-Capillary: 212 mg/dL — ABNORMAL HIGH (ref 70–99)
Glucose-Capillary: 220 mg/dL — ABNORMAL HIGH (ref 70–99)
Glucose-Capillary: 220 mg/dL — ABNORMAL HIGH (ref 70–99)

## 2019-07-17 LAB — PROTIME-INR
INR: 1 (ref 0.8–1.2)
Prothrombin Time: 12.9 seconds (ref 11.4–15.2)

## 2019-07-17 SURGERY — ANTERIOR LATERAL LUMBAR FUSION 3 LEVELS
Anesthesia: General

## 2019-07-17 MED ORDER — SUFENTANIL CITRATE 50 MCG/ML IV SOLN
INTRAVENOUS | Status: DC | PRN
  Administered 2019-07-17: 10 ug via INTRAVENOUS
  Administered 2019-07-17: 5 ug via INTRAVENOUS
  Administered 2019-07-17: 15 ug via INTRAVENOUS
  Administered 2019-07-17: 5 ug via INTRAVENOUS
  Administered 2019-07-17: 10 ug via INTRAVENOUS
  Administered 2019-07-17 (×5): 5 ug via INTRAVENOUS

## 2019-07-17 MED ORDER — LIDOCAINE-EPINEPHRINE 1 %-1:100000 IJ SOLN
INTRAMUSCULAR | Status: AC
  Filled 2019-07-17: qty 1

## 2019-07-17 MED ORDER — PHENYLEPHRINE HCL-NACL 10-0.9 MG/250ML-% IV SOLN
INTRAVENOUS | Status: DC | PRN
  Administered 2019-07-17: 75 ug/min via INTRAVENOUS
  Administered 2019-07-17: 50 ug/min via INTRAVENOUS
  Administered 2019-07-17: 35 ug/min via INTRAVENOUS

## 2019-07-17 MED ORDER — METHOCARBAMOL 500 MG PO TABS
ORAL_TABLET | ORAL | Status: AC
  Filled 2019-07-17: qty 1

## 2019-07-17 MED ORDER — 0.9 % SODIUM CHLORIDE (POUR BTL) OPTIME
TOPICAL | Status: DC | PRN
  Administered 2019-07-17 (×2): 1000 mL

## 2019-07-17 MED ORDER — SUFENTANIL CITRATE 50 MCG/ML IV SOLN
INTRAVENOUS | Status: AC
  Filled 2019-07-17: qty 1

## 2019-07-17 MED ORDER — PHENYLEPHRINE HCL (PRESSORS) 10 MG/ML IV SOLN
INTRAVENOUS | Status: AC
  Filled 2019-07-17: qty 1

## 2019-07-17 MED ORDER — ONDANSETRON HCL 4 MG PO TABS: 4.0000 mg | ORAL_TABLET | Freq: Four times a day (QID) | ORAL | Status: DC | PRN

## 2019-07-17 MED ORDER — BISACODYL 10 MG RE SUPP: 10.0000 mg | Freq: Every day | RECTAL | Status: DC | PRN

## 2019-07-17 MED ORDER — METHOCARBAMOL 1000 MG/10ML IJ SOLN
500.0000 mg | Freq: Four times a day (QID) | INTRAVENOUS | Status: DC | PRN
  Filled 2019-07-17: qty 5

## 2019-07-17 MED ORDER — AMLODIPINE BESYLATE 5 MG PO TABS
5.0000 mg | ORAL_TABLET | Freq: Every day | ORAL | Status: DC
  Administered 2019-07-17 – 2019-07-19 (×3): 5 mg via ORAL
  Filled 2019-07-17 (×3): qty 1

## 2019-07-17 MED ORDER — ACETAMINOPHEN 10 MG/ML IV SOLN
INTRAVENOUS | Status: AC
  Administered 2019-07-17: 1000 mg
  Filled 2019-07-17: qty 100

## 2019-07-17 MED ORDER — SODIUM CHLORIDE 0.9 % IV SOLN: 250.0000 mL | INTRAVENOUS | Status: DC

## 2019-07-17 MED ORDER — BUPIVACAINE HCL (PF) 0.5 % IJ SOLN
INTRAMUSCULAR | Status: DC | PRN
  Administered 2019-07-17: 6 mL
  Administered 2019-07-17: 20 mL

## 2019-07-17 MED ORDER — SODIUM CHLORIDE 0.9% FLUSH: 3.0000 mL | INTRAVENOUS | Status: DC | PRN

## 2019-07-17 MED ORDER — PHENYLEPHRINE 40 MCG/ML (10ML) SYRINGE FOR IV PUSH (FOR BLOOD PRESSURE SUPPORT)
PREFILLED_SYRINGE | INTRAVENOUS | Status: AC
  Filled 2019-07-17: qty 20

## 2019-07-17 MED ORDER — METFORMIN HCL ER 500 MG PO TB24
1000.0000 mg | ORAL_TABLET | Freq: Two times a day (BID) | ORAL | Status: DC
  Administered 2019-07-17 – 2019-07-20 (×6): 1000 mg via ORAL
  Filled 2019-07-17 (×6): qty 2

## 2019-07-17 MED ORDER — ACETAMINOPHEN 325 MG PO TABS: 650.0000 mg | ORAL_TABLET | ORAL | Status: DC | PRN

## 2019-07-17 MED ORDER — DOCUSATE SODIUM 100 MG PO CAPS
100.0000 mg | ORAL_CAPSULE | Freq: Two times a day (BID) | ORAL | Status: DC
  Administered 2019-07-17 – 2019-07-19 (×5): 100 mg via ORAL
  Filled 2019-07-17 (×5): qty 1

## 2019-07-17 MED ORDER — MECLIZINE HCL 25 MG PO TABS
25.0000 mg | ORAL_TABLET | Freq: Two times a day (BID) | ORAL | Status: DC | PRN
  Filled 2019-07-17: qty 1

## 2019-07-17 MED ORDER — PHENOL 1.4 % MT LIQD: 1.0000 | OROMUCOSAL | Status: DC | PRN

## 2019-07-17 MED ORDER — OXYCODONE-ACETAMINOPHEN 5-325 MG PO TABS
1.0000 | ORAL_TABLET | ORAL | Status: DC | PRN
  Administered 2019-07-17 – 2019-07-20 (×16): 2 via ORAL
  Filled 2019-07-17 (×16): qty 2

## 2019-07-17 MED ORDER — INSULIN ASPART 100 UNIT/ML ~~LOC~~ SOLN
0.0000 [IU] | Freq: Every day | SUBCUTANEOUS | Status: DC
  Administered 2019-07-17: 2 [IU] via SUBCUTANEOUS

## 2019-07-17 MED ORDER — SENNA 8.6 MG PO TABS
1.0000 | ORAL_TABLET | Freq: Two times a day (BID) | ORAL | Status: DC
  Administered 2019-07-17 – 2019-07-19 (×5): 8.6 mg via ORAL
  Filled 2019-07-17 (×5): qty 1

## 2019-07-17 MED ORDER — EPHEDRINE SULFATE-NACL 50-0.9 MG/10ML-% IV SOSY
PREFILLED_SYRINGE | INTRAVENOUS | Status: DC | PRN
  Administered 2019-07-17 (×2): 10 mg via INTRAVENOUS

## 2019-07-17 MED ORDER — THROMBIN 5000 UNITS EX SOLR
OROMUCOSAL | Status: DC | PRN
  Administered 2019-07-17: 08:00:00 5 mL via TOPICAL

## 2019-07-17 MED ORDER — LIDOCAINE-EPINEPHRINE 1 %-1:100000 IJ SOLN
INTRAMUSCULAR | Status: DC | PRN
  Administered 2019-07-17: 6 mL
  Administered 2019-07-17: 20 mL

## 2019-07-17 MED ORDER — PROPOFOL 10 MG/ML IV BOLUS
INTRAVENOUS | Status: AC
  Filled 2019-07-17: qty 20

## 2019-07-17 MED ORDER — VANCOMYCIN HCL IN DEXTROSE 1-5 GM/200ML-% IV SOLN
1000.0000 mg | INTRAVENOUS | Status: AC
  Administered 2019-07-17: 1000 mg via INTRAVENOUS
  Filled 2019-07-17: qty 200

## 2019-07-17 MED ORDER — MIDAZOLAM HCL 2 MG/2ML IJ SOLN
INTRAMUSCULAR | Status: AC
  Filled 2019-07-17: qty 2

## 2019-07-17 MED ORDER — LIDOCAINE 2% (20 MG/ML) 5 ML SYRINGE
INTRAMUSCULAR | Status: AC
  Filled 2019-07-17: qty 5

## 2019-07-17 MED ORDER — ACETAMINOPHEN 650 MG RE SUPP: 650.0000 mg | RECTAL | Status: DC | PRN

## 2019-07-17 MED ORDER — POLYETHYLENE GLYCOL 3350 17 G PO PACK: 17.0000 g | PACK | Freq: Every day | ORAL | Status: DC | PRN

## 2019-07-17 MED ORDER — GLIMEPIRIDE 2 MG PO TABS
4.0000 mg | ORAL_TABLET | Freq: Two times a day (BID) | ORAL | Status: DC
  Administered 2019-07-17 – 2019-07-19 (×5): 4 mg via ORAL
  Filled 2019-07-17 (×5): qty 2

## 2019-07-17 MED ORDER — INSULIN ASPART 100 UNIT/ML ~~LOC~~ SOLN
0.0000 [IU] | Freq: Three times a day (TID) | SUBCUTANEOUS | Status: DC
  Administered 2019-07-17 (×2): 7 [IU] via SUBCUTANEOUS
  Administered 2019-07-18: 3 [IU] via SUBCUTANEOUS
  Administered 2019-07-18: 4 [IU] via SUBCUTANEOUS
  Administered 2019-07-19 (×2): 3 [IU] via SUBCUTANEOUS

## 2019-07-17 MED ORDER — MIDAZOLAM HCL 5 MG/5ML IJ SOLN
INTRAMUSCULAR | Status: DC | PRN
  Administered 2019-07-17: 2 mg via INTRAVENOUS

## 2019-07-17 MED ORDER — MORPHINE SULFATE (PF) 2 MG/ML IV SOLN
2.0000 mg | INTRAVENOUS | Status: DC | PRN
  Administered 2019-07-17 – 2019-07-18 (×2): 4 mg via INTRAVENOUS
  Filled 2019-07-17 (×2): qty 2

## 2019-07-17 MED ORDER — FAMOTIDINE 20 MG PO TABS: 20.0000 mg | ORAL_TABLET | Freq: Two times a day (BID) | ORAL | Status: DC | PRN

## 2019-07-17 MED ORDER — ONDANSETRON HCL 4 MG/2ML IJ SOLN
INTRAMUSCULAR | Status: DC | PRN
  Administered 2019-07-17: 4 mg via INTRAVENOUS

## 2019-07-17 MED ORDER — BUPIVACAINE HCL (PF) 0.5 % IJ SOLN
INTRAMUSCULAR | Status: AC
  Filled 2019-07-17: qty 30

## 2019-07-17 MED ORDER — FENTANYL CITRATE (PF) 100 MCG/2ML IJ SOLN
25.0000 ug | INTRAMUSCULAR | Status: DC | PRN
  Administered 2019-07-17 (×4): 50 ug via INTRAVENOUS

## 2019-07-17 MED ORDER — MENTHOL 3 MG MT LOZG: 1.0000 | LOZENGE | OROMUCOSAL | Status: DC | PRN

## 2019-07-17 MED ORDER — FENTANYL CITRATE (PF) 100 MCG/2ML IJ SOLN
INTRAMUSCULAR | Status: AC
  Filled 2019-07-17: qty 2

## 2019-07-17 MED ORDER — METOPROLOL TARTRATE 25 MG PO TABS
25.0000 mg | ORAL_TABLET | Freq: Two times a day (BID) | ORAL | Status: DC
  Administered 2019-07-17 – 2019-07-19 (×5): 25 mg via ORAL
  Filled 2019-07-17 (×5): qty 1

## 2019-07-17 MED ORDER — THROMBIN 5000 UNITS EX SOLR
CUTANEOUS | Status: AC
  Filled 2019-07-17: qty 5000

## 2019-07-17 MED ORDER — SODIUM CHLORIDE 0.9% FLUSH
3.0000 mL | Freq: Two times a day (BID) | INTRAVENOUS | Status: DC
  Administered 2019-07-17 – 2019-07-19 (×5): 3 mL via INTRAVENOUS

## 2019-07-17 MED ORDER — FLEET ENEMA 7-19 GM/118ML RE ENEM
1.0000 | ENEMA | Freq: Once | RECTAL | Status: AC | PRN
  Administered 2019-07-19: 1 via RECTAL
  Filled 2019-07-17: qty 1

## 2019-07-17 MED ORDER — ONDANSETRON HCL 4 MG/2ML IJ SOLN
INTRAMUSCULAR | Status: AC
  Filled 2019-07-17: qty 2

## 2019-07-17 MED ORDER — CHLORHEXIDINE GLUCONATE CLOTH 2 % EX PADS: 6.0000 | MEDICATED_PAD | Freq: Once | CUTANEOUS | Status: DC

## 2019-07-17 MED ORDER — CANAGLIFLOZIN 100 MG PO TABS
100.0000 mg | ORAL_TABLET | Freq: Every day | ORAL | Status: DC
  Administered 2019-07-18 – 2019-07-20 (×3): 100 mg via ORAL
  Filled 2019-07-17 (×3): qty 1

## 2019-07-17 MED ORDER — INSULIN ASPART 100 UNIT/ML ~~LOC~~ SOLN
SUBCUTANEOUS | Status: AC
  Filled 2019-07-17: qty 1

## 2019-07-17 MED ORDER — FUROSEMIDE 20 MG PO TABS: 20.0000 mg | ORAL_TABLET | Freq: Every day | ORAL | Status: DC | PRN

## 2019-07-17 MED ORDER — LACTATED RINGERS IV SOLN: INTRAVENOUS | Status: DC | PRN

## 2019-07-17 MED ORDER — ROSUVASTATIN CALCIUM 20 MG PO TABS
20.0000 mg | ORAL_TABLET | Freq: Every day | ORAL | Status: DC
  Administered 2019-07-18 – 2019-07-19 (×2): 20 mg via ORAL
  Filled 2019-07-17 (×2): qty 1

## 2019-07-17 MED ORDER — PHENYLEPHRINE 40 MCG/ML (10ML) SYRINGE FOR IV PUSH (FOR BLOOD PRESSURE SUPPORT)
PREFILLED_SYRINGE | INTRAVENOUS | Status: DC | PRN
  Administered 2019-07-17: 80 ug via INTRAVENOUS
  Administered 2019-07-17: 40 ug via INTRAVENOUS
  Administered 2019-07-17: 80 ug via INTRAVENOUS
  Administered 2019-07-17: 120 ug via INTRAVENOUS
  Administered 2019-07-17: 40 ug via INTRAVENOUS
  Administered 2019-07-17: 160 ug via INTRAVENOUS
  Administered 2019-07-17 (×5): 40 ug via INTRAVENOUS
  Administered 2019-07-17: 80 ug via INTRAVENOUS

## 2019-07-17 MED ORDER — DULAGLUTIDE 1.5 MG/0.5ML ~~LOC~~ SOAJ: 1.5000 mg | SUBCUTANEOUS | Status: DC

## 2019-07-17 MED ORDER — KETOROLAC TROMETHAMINE 15 MG/ML IJ SOLN
7.5000 mg | Freq: Four times a day (QID) | INTRAMUSCULAR | Status: AC
  Administered 2019-07-17 – 2019-07-18 (×4): 7.5 mg via INTRAVENOUS
  Filled 2019-07-17 (×4): qty 1

## 2019-07-17 MED ORDER — SUCCINYLCHOLINE CHLORIDE 200 MG/10ML IV SOSY
PREFILLED_SYRINGE | INTRAVENOUS | Status: AC
  Filled 2019-07-17: qty 10

## 2019-07-17 MED ORDER — LOSARTAN POTASSIUM 50 MG PO TABS
100.0000 mg | ORAL_TABLET | Freq: Every day | ORAL | Status: DC
  Administered 2019-07-18 – 2019-07-19 (×2): 100 mg via ORAL
  Filled 2019-07-17 (×2): qty 2

## 2019-07-17 MED ORDER — PROPOFOL 10 MG/ML IV BOLUS
INTRAVENOUS | Status: DC | PRN
  Administered 2019-07-17 (×2): 10 mg via INTRAVENOUS
  Administered 2019-07-17: 20 mg via INTRAVENOUS
  Administered 2019-07-17: 50 mg via INTRAVENOUS
  Administered 2019-07-17: 100 mg via INTRAVENOUS
  Administered 2019-07-17: 10 mg via INTRAVENOUS

## 2019-07-17 MED ORDER — ONDANSETRON HCL 4 MG/2ML IJ SOLN
4.0000 mg | Freq: Four times a day (QID) | INTRAMUSCULAR | Status: DC | PRN
  Administered 2019-07-19: 4 mg via INTRAVENOUS
  Filled 2019-07-17: qty 2

## 2019-07-17 MED ORDER — PANTOPRAZOLE SODIUM 40 MG PO TBEC
40.0000 mg | DELAYED_RELEASE_TABLET | Freq: Every day | ORAL | Status: DC
  Administered 2019-07-17 – 2019-07-19 (×3): 40 mg via ORAL
  Filled 2019-07-17 (×3): qty 1

## 2019-07-17 MED ORDER — ACETAMINOPHEN 10 MG/ML IV SOLN: 1000.0000 mg | Freq: Once | INTRAVENOUS | Status: DC

## 2019-07-17 MED ORDER — LABETALOL HCL 5 MG/ML IV SOLN
INTRAVENOUS | Status: AC
  Filled 2019-07-17: qty 4

## 2019-07-17 MED ORDER — DEXAMETHASONE SODIUM PHOSPHATE 10 MG/ML IJ SOLN
INTRAMUSCULAR | Status: DC | PRN
  Administered 2019-07-17: 5 mg via INTRAVENOUS

## 2019-07-17 MED ORDER — LIDOCAINE 2% (20 MG/ML) 5 ML SYRINGE
INTRAMUSCULAR | Status: DC | PRN
  Administered 2019-07-17: 100 mg via INTRAVENOUS

## 2019-07-17 MED ORDER — ALBUMIN HUMAN 5 % IV SOLN: INTRAVENOUS | Status: DC | PRN

## 2019-07-17 MED ORDER — SUCCINYLCHOLINE CHLORIDE 200 MG/10ML IV SOSY
PREFILLED_SYRINGE | INTRAVENOUS | Status: DC | PRN
  Administered 2019-07-17: 120 mg via INTRAVENOUS

## 2019-07-17 MED ORDER — DEXAMETHASONE SODIUM PHOSPHATE 10 MG/ML IJ SOLN
INTRAMUSCULAR | Status: AC
  Filled 2019-07-17: qty 1

## 2019-07-17 MED ORDER — INSULIN GLARGINE 100 UNIT/ML ~~LOC~~ SOLN
35.0000 [IU] | Freq: Every day | SUBCUTANEOUS | Status: DC
  Administered 2019-07-18 – 2019-07-19 (×2): 35 [IU] via SUBCUTANEOUS
  Filled 2019-07-17 (×2): qty 0.35

## 2019-07-17 MED ORDER — KETOROLAC TROMETHAMINE 15 MG/ML IJ SOLN
INTRAMUSCULAR | Status: AC
  Filled 2019-07-17: qty 1

## 2019-07-17 MED ORDER — LABETALOL HCL 5 MG/ML IV SOLN
5.0000 mg | Freq: Once | INTRAVENOUS | Status: AC
  Administered 2019-07-17: 5 mg via INTRAVENOUS

## 2019-07-17 MED ORDER — METHOCARBAMOL 500 MG PO TABS
500.0000 mg | ORAL_TABLET | Freq: Four times a day (QID) | ORAL | Status: DC | PRN
  Administered 2019-07-17 – 2019-07-20 (×9): 500 mg via ORAL
  Filled 2019-07-17 (×8): qty 1

## 2019-07-17 SURGICAL SUPPLY — 83 items
ADH SKN CLS APL DERMABOND .7 (GAUZE/BANDAGES/DRESSINGS) ×2
BAG DECANTER FOR FLEXI CONT (MISCELLANEOUS) ×3 IMPLANT
BIT DRILL LONG 3.0X30 (BIT) ×1 IMPLANT
BIT DRILL LONG 3.0X30MM (BIT) ×1
BIT DRILL LONG 3X80 (BIT) IMPLANT
BIT DRILL LONG 3X80MM (BIT)
BIT DRILL LONG 4X80 (BIT) IMPLANT
BIT DRILL LONG 4X80MM (BIT)
BIT DRILL SHORT 3.0X30 (BIT) IMPLANT
BIT DRILL SHORT 3.0X30MM (BIT)
BIT DRILL SHORT 3X80 (BIT) IMPLANT
BIT DRILL SHORT 3X80MM (BIT)
BLADE CLIPPER SURG (BLADE) ×2 IMPLANT
BLADE SURG 11 STRL SS (BLADE) ×3 IMPLANT
BONE MATRIX OSTEOCEL PRO LRG (Bone Implant) ×4 IMPLANT
CAGE COROENT XL WIDE 14X22X60M (Cage) ×2 IMPLANT
CAGE COROENT XL WIDE14X22X55 (Cage) ×2 IMPLANT
CARTRIDGE OIL MAESTRO DRILL (MISCELLANEOUS) ×1 IMPLANT
CATH ROBINSON RED A/P 8FR (CATHETERS) ×2 IMPLANT
CLIP NEUROVISION LG (CLIP) ×2 IMPLANT
CNTNR URN SCR LID CUP LEK RST (MISCELLANEOUS) ×1 IMPLANT
CONT SPEC 4OZ STRL OR WHT (MISCELLANEOUS) ×3
COVER BACK TABLE 24X17X13 BIG (DRAPES) IMPLANT
COVER BACK TABLE 60X90IN (DRAPES) ×3 IMPLANT
COVER WAND RF STERILE (DRAPES) ×7 IMPLANT
DERMABOND ADVANCED (GAUZE/BANDAGES/DRESSINGS) ×4
DERMABOND ADVANCED .7 DNX12 (GAUZE/BANDAGES/DRESSINGS) ×3 IMPLANT
DIFFUSER DRILL AIR PNEUMATIC (MISCELLANEOUS) ×3 IMPLANT
DRAPE C-ARM 42X72 X-RAY (DRAPES) ×6 IMPLANT
DRAPE C-ARMOR (DRAPES) ×6 IMPLANT
DRAPE LAPAROTOMY 100X72X124 (DRAPES) ×6 IMPLANT
DRAPE SHEET LG 3/4 BI-LAMINATE (DRAPES) ×3 IMPLANT
DURAPREP 26ML APPLICATOR (WOUND CARE) ×6 IMPLANT
ELECT BLADE 4.0 EZ CLEAN MEGAD (MISCELLANEOUS)
ELECT REM PT RETURN 9FT ADLT (ELECTROSURGICAL) ×6
ELECTRODE BLDE 4.0 EZ CLN MEGD (MISCELLANEOUS) IMPLANT
ELECTRODE REM PT RTRN 9FT ADLT (ELECTROSURGICAL) ×2 IMPLANT
GAUZE 4X4 16PLY RFD (DISPOSABLE) IMPLANT
GLOVE BIOGEL PI IND STRL 8.5 (GLOVE) ×2 IMPLANT
GLOVE BIOGEL PI INDICATOR 8.5 (GLOVE) ×4
GLOVE ECLIPSE 8.5 STRL (GLOVE) ×8 IMPLANT
GLOVE EXAM NITRILE XL STR (GLOVE) IMPLANT
GOWN STRL REUS W/ TWL LRG LVL3 (GOWN DISPOSABLE) IMPLANT
GOWN STRL REUS W/ TWL XL LVL3 (GOWN DISPOSABLE) ×3 IMPLANT
GOWN STRL REUS W/TWL 2XL LVL3 (GOWN DISPOSABLE) ×8 IMPLANT
GOWN STRL REUS W/TWL LRG LVL3 (GOWN DISPOSABLE) ×9
GOWN STRL REUS W/TWL XL LVL3 (GOWN DISPOSABLE) ×3
GUIDEWIRE NITINOL BEVEL TIP (WIRE) ×16 IMPLANT
HEMOSTAT POWDER KIT SURGIFOAM (HEMOSTASIS) ×2 IMPLANT
KIT BASIN OR (CUSTOM PROCEDURE TRAY) ×6 IMPLANT
KIT DILATOR XLIF 5 (KITS) ×1 IMPLANT
KIT SPINE MAZOR X ROBO DISP (MISCELLANEOUS) ×3 IMPLANT
KIT SURGICAL ACCESS MAXCESS 4 (KITS) ×2 IMPLANT
KIT TURNOVER KIT B (KITS) ×5 IMPLANT
KIT XLIF (KITS) ×1
MARKER SKIN DUAL TIP RULER LAB (MISCELLANEOUS) ×3 IMPLANT
MODULE NVM5 NEXT GEN EMG (NEEDLE) ×2 IMPLANT
NDL HYPO 25X1 1.5 SAFETY (NEEDLE) ×2 IMPLANT
NEEDLE HYPO 25X1 1.5 SAFETY (NEEDLE) ×6 IMPLANT
NS IRRIG 1000ML POUR BTL (IV SOLUTION) ×6 IMPLANT
OIL CARTRIDGE MAESTRO DRILL (MISCELLANEOUS) ×3
PACK LAMINECTOMY NEURO (CUSTOM PROCEDURE TRAY) ×6 IMPLANT
PAD ARMBOARD 7.5X6 YLW CONV (MISCELLANEOUS) ×3 IMPLANT
PIN HEAD 2.5X60MM (PIN) IMPLANT
ROD RELINE MAS LORD 5.5X110MM (Rod) ×4 IMPLANT
SCREW LOCK RELINE 5.5 TULIP (Screw) ×16 IMPLANT
SCREW MAS RELINE 6.5X45 POLY (Screw) ×12 IMPLANT
SCREW RELINE MAS POLY 7.5X45MM (Screw) ×4 IMPLANT
SCREW SCHANZ SA 4.0MM (MISCELLANEOUS) ×2 IMPLANT
SPACER COROENT XL 16X22X55 (Spacer) ×2 IMPLANT
SPONGE LAP 4X18 RFD (DISPOSABLE) IMPLANT
SPONGE SURGIFOAM ABS GEL SZ50 (HEMOSTASIS) IMPLANT
SUT VIC AB 1 CT1 18XBRD ANBCTR (SUTURE) ×1 IMPLANT
SUT VIC AB 1 CT1 8-18 (SUTURE) ×3
SUT VIC AB 2-0 CP2 18 (SUTURE) ×12 IMPLANT
SUT VIC AB 3-0 SH 8-18 (SUTURE) ×14 IMPLANT
SUT VIC AB 4-0 RB1 18 (SUTURE) ×8 IMPLANT
TAPE CLOTH 4X10 WHT NS (GAUZE/BANDAGES/DRESSINGS) ×6 IMPLANT
TOWEL GREEN STERILE (TOWEL DISPOSABLE) ×6 IMPLANT
TOWEL GREEN STERILE FF (TOWEL DISPOSABLE) ×6 IMPLANT
TRAY FOLEY MTR SLVR 16FR STAT (SET/KITS/TRAYS/PACK) ×4 IMPLANT
TUBE MAZOR SA REDUCTION (TUBING) ×3 IMPLANT
WATER STERILE IRR 1000ML POUR (IV SOLUTION) ×6 IMPLANT

## 2019-07-17 NOTE — H&P (Signed)
Vernon Ruiz is an 69 y.o. male.   Chief Complaint: Back pain right leg weakness HPI: Vernon Ruiz is a 69 year old individual who had a herniated nucleus pulposus at the level of L2-3 on the right side.  He had developed a foot drop that was initially rather painless however as he became progressively weaker it became apparent that he would need surgical decompression.  This was undertaken via a microdiscectomy.  Postoperatively unfortunately he had worsening pain and worsening weakness in both lower extremities and ultimately a myelogram post myelogram CAT scan was recently performed that demonstrates that he has severe stenosis at several levels L to 3 L3-4 and L1-2.  After careful consideration of his options I advised surgical decompression via an indirect technique and posterior fixation with pedicle screws.  He is now being admitted for this procedure.  Patient does have an underlying history of diabetes mellitus.  Past Medical History:  Diagnosis Date  . Aortic stenosis    s/p AVR using a 69mm Edwards Magna-Ease pericardial valve 10/04/16  . Coronary artery disease   . Diabetes mellitus without complication (HCC)   . GERD (gastroesophageal reflux disease)   . Heart murmur   . Hypertension   . Inguinal hernia 1970s  . Obstructive sleep apnea    positive from a home test, still needs to get another study done  . Persistent atrial fibrillation (HCC)    s/p clipping of LA appendage 10/04/16  . Right carotid bruit 12/12/2018  . Right foot drop 11/07/2018  . Stroke [facility]) 12/12/2018    Past Surgical History:  Procedure Laterality Date  . AORTIC VALVE REPLACEMENT N/A 10/04/2016   Procedure: AORTIC VALVE REPLACEMENT (AVR);  Surgeon: [name], MD;  Location: [facility] OR;  Service: Open Heart Surgery;  Laterality: N/A;  . APPENDECTOMY    . BACK SURGERY    . CARDIAC CATHETERIZATION N/A 03/10/2016   Procedure: Right/Left Heart Cath and Coronary Angiography;  Surgeon: [name], MD;  Location:  [facility] INVASIVE CV LAB;  Service: Cardiovascular;  Laterality: N/A;  . CERVICAL SPINE SURGERY     07/13/2019: per patient 2020  . CLIPPING OF ATRIAL APPENDAGE N/A 10/04/2016   Procedure: CLIPPING OF ATRIAL APPENDAGE;  Surgeon: [name], MD;  Location: [facility] OR;  Service: Open Heart Surgery;  Laterality: N/A;  . COLONOSCOPY    . EYE SURGERY Right    growth removed from eye  . HERNIA REPAIR Bilateral    inguinal  . TEE WITHOUT CARDIOVERSION N/A 10/04/2016   Procedure: TRANSESOPHAGEAL ECHOCARDIOGRAM (TEE);  Surgeon: [name], MD;  Location: [facility] OR;  Service: Open Heart Surgery;  Laterality: N/A;    History reviewed. No pertinent family history. Social History:  reports that he quit smoking about 2 years ago. His smoking use included cigars. He has never used smokeless tobacco. He reports current alcohol use. He reports that he does not use drugs.  Allergies:  Allergies  Allergen Reactions  . Penicillins Itching, Rash and Other (See Comments)    All over body Did it involve swelling of the face/tongue/throat, SOB, or low BP? No Did it involve sudden or severe rash/hives, skin peeling, or any reaction on the inside of your mouth or nose? Yes Did you need to seek medical attention at a hospital or doctor's office? No When did it last happen?20 + years If all above answers are "NO", may proceed with cephalosporin use.   . Sulfa Antibiotics Rash    Rash    Medications  Prior to Admission  Medication Sig Dispense Refill  . acetaminophen (TYLENOL) 325 MG tablet Take 2 tablets (650 mg total) by mouth every 6 (six) hours as needed for mild pain.    Marland Kitchen amLODipine (NORVASC) 5 MG tablet Take 5 mg by mouth daily.    . B Complex-C (B-COMPLEX WITH VITAMIN C) tablet Take 1 tablet by mouth 3 (three) times a week.    . Cholecalciferol (VITAMIN D3) 250 MCG (10000 UT) capsule Take 10,000 Units by mouth 3 (three) times a week.    . empagliflozin (JARDIANCE) 25 MG TABS tablet Take 12.5 mg by mouth  daily.    . famotidine (PEPCID) 20 MG tablet Take 1 tablet (20 mg total) by mouth 2 (two) times daily for 15 days. (Patient taking differently: Take 20 mg by mouth 2 (two) times daily as needed (pain.). ) 30 tablet 0  . furosemide (LASIX) 20 MG tablet Take one tablet by mouth daily only as needed for swelling (Patient taking differently: Take 20 mg by mouth daily as needed (swelling/fluid retention.). ) 90 tablet 3  . losartan (COZAAR) 100 MG tablet Take 100 mg by mouth daily.    . meclizine (ANTIVERT) 25 MG tablet Take 25 mg by mouth 2 (two) times daily as needed (dizziness/vertigo).     . metFORMIN (GLUCOPHAGE-XR) 500 MG 24 hr tablet Take 1,000 mg by mouth 2 (two) times daily.    . metoprolol tartrate (LOPRESSOR) 25 MG tablet Take 1 tablet (25 mg total) by mouth 2 (two) times daily. 60 tablet 0  . omeprazole (PRILOSEC) 20 MG capsule Take 20 mg by mouth 2 (two) times daily as needed.    . rosuvastatin (CRESTOR) 20 MG tablet Take 1 tablet (20 mg total) by mouth daily. 90 tablet 3  . TOUJEO SOLOSTAR 300 UNIT/ML Solostar Pen Inject 35 Units into the skin daily.     . vitamin C (ASCORBIC ACID) 500 MG tablet Take 1,500 mg by mouth 3 (three) times a week.     . zinc gluconate 50 MG tablet Take 50 mg by mouth daily.    Marland Kitchen apixaban (ELIQUIS) 5 MG TABS tablet Take 5 mg by mouth 2 (two) times daily.    . Dulaglutide (TRULICITY) 1.5 MG/0.5ML SOPN Inject 1.5 mg into the skin every Sunday.     . enoxaparin (LOVENOX) 120 MG/0.8ML injection Inject 0.8 mLs (120 mg total) into the skin daily for 3 days. 2.4 mL 0  . glimepiride (AMARYL) 4 MG tablet Take 4 mg by mouth 2 (two) times daily.       Results for orders placed or performed during the hospital encounter of 07/17/19 (from the past 48 hour(s))  Protime-INR     Status: None   Collection Time: 07/17/19  5:50 AM  Result Value Ref Range   Prothrombin Time 12.9 11.4 - 15.2 seconds   INR 1.0 0.8 - 1.2    Comment: (NOTE) INR goal varies based on device and  disease states. Performed at [facility], [address] [place], [place] [address]   Glucose, capillary     Status: Abnormal   Collection Time: 07/17/19  6:06 AM  Result Value Ref Range   Glucose-Capillary 157 (H) 70 - 99 mg/dL    Comment: Glucose reference range applies only to samples taken after fasting for at least 8 hours.   Comment 1 Notify RN    No results found.  Review of Systems  Constitutional: Positive for activity change.  HENT: Negative.  Eyes: Negative.   Respiratory: Negative.   Cardiovascular: Negative.   Gastrointestinal: Negative.   Endocrine: Negative.   Genitourinary: Negative.   Musculoskeletal: Positive for back pain.  Neurological: Positive for weakness.  Hematological: Negative.   Psychiatric/Behavioral: Negative.     Blood pressure (!) 163/86, pulse (!) 58, height 5\' 5"  (1.651 m), weight 89.2 kg, SpO2 98 %. Physical Exam  Constitutional: He is oriented to person, place, and time. He appears well-developed and well-nourished.  Eyes: Pupils are equal, round, and reactive to light. Conjunctivae and EOM are normal.  Cardiovascular: Normal rate and regular rhythm.  Respiratory: Effort normal and breath sounds normal.  GI: Soft. Bowel sounds are normal.  Musculoskeletal:     Cervical back: Normal range of motion and neck supple.  Neurological: He is alert and oriented to person, place, and time.  Diffuse weakness in the lower extremities with weakness in the tibialis anterior on the left 4 out of 5 gastrocs on the left 4-5 right lower extremity has gastroc strength at 4 out of 5 no tibialis anterior or extensor houses longus strength.  Quadriceps and iliopsoas are 4-5 bilaterally straight leg raising is markedly positive on the right of 30 degrees Patrick's maneuver is negative bilaterally.  Absent reflexes in the patella and the Achilles upper extremity reflexes are absent in the biceps and triceps and brachioradialis.  Cranial nerve examination is  normal.  Skin: Skin is warm and dry.  Psychiatric: He has a normal mood and affect. His behavior is normal. Judgment and thought content normal.     Assessment/Plan Spondylosis and stenosis with radiculopathy and neurogenic claudication history of discectomy L2-3 now admitted for decompression at L1-L2 3 L3-4 and posterior fixation.  Earleen Newport, MD 07/17/2019, 7:43 AM

## 2019-07-17 NOTE — Transfer of Care (Signed)
Immediate Anesthesia Transfer of Care Note  Patient: Vernon Ruiz  Procedure(s) Performed: Lumbar one-two Lumbar two-three Lumbar three-four Anterolateral lumbar interbody fusion with posterior fixation Lumbar one to Lumbar four with Mazor (N/A ) APPLICATION OF ROBOTIC ASSISTANCE FOR SPINAL PROCEDURE (N/A ) Posterior fixation Lumbar 1 to Lumbar 4 (N/A )  Patient Location: PACU  Anesthesia Type:General  Level of Consciousness: drowsy and patient cooperative  Airway & Oxygen Therapy: Patient Spontanous Breathing and Patient connected to nasal cannula oxygen  Post-op Assessment: Report given to RN, Post -op Vital signs reviewed and stable and Patient moving all extremities  Post vital signs: Reviewed and stable  Last Vitals:  Vitals Value Taken Time  BP 187/89 07/17/19 1429  Temp    Pulse 117 07/17/19 1431  Resp 13 07/17/19 1432  SpO2 93 % 07/17/19 1431  Vitals shown include unvalidated device data.  Last Pain:  Vitals:   07/17/19 0603  PainSc: 2          Complications: No apparent anesthesia complications

## 2019-07-17 NOTE — Anesthesia Procedure Notes (Signed)
Procedure Name: Intubation Date/Time: 07/17/2019 8:00 AM Performed by: Moshe Salisbury, CRNA Pre-anesthesia Checklist: Patient identified, Emergency Drugs available, Suction available and Patient being monitored Patient Re-evaluated:Patient Re-evaluated prior to induction Oxygen Delivery Method: Circle System Utilized Preoxygenation: Pre-oxygenation with 100% oxygen Induction Type: IV induction Ventilation: Mask ventilation without difficulty Laryngoscope Size: Mac and 4 Tube type: Oral Tube size: 8.0 mm Number of attempts: 1 Airway Equipment and Method: Stylet and Oral airway Placement Confirmation: ETT inserted through vocal cords under direct vision,  positive ETCO2 and breath sounds checked- equal and bilateral Secured at: 22 cm Tube secured with: Tape Dental Injury: Teeth and Oropharynx as per pre-operative assessment

## 2019-07-17 NOTE — Anesthesia Postprocedure Evaluation (Signed)
Anesthesia Post Note  Patient: Vernon Ruiz  Procedure(s) Performed: Lumbar one-two Lumbar two-three Lumbar three-four Anterolateral lumbar interbody fusion with posterior fixation Lumbar one to Lumbar four with Mazor (N/A ) APPLICATION OF ROBOTIC ASSISTANCE FOR SPINAL PROCEDURE (N/A ) Posterior fixation Lumbar 1 to Lumbar 4 (N/A )     Patient location during evaluation: PACU Anesthesia Type: General Level of consciousness: awake and alert Pain management: pain level controlled Vital Signs Assessment: post-procedure vital signs reviewed and stable Respiratory status: spontaneous breathing, nonlabored ventilation and respiratory function stable Cardiovascular status: blood pressure returned to baseline and stable Postop Assessment: no apparent nausea or vomiting Anesthetic complications: no    Last Vitals:  Vitals:   07/17/19 1600 07/17/19 1615  BP: (!) 167/72 (!) 178/87  Pulse: 84 (!) 101  Resp: 20 (!) 21  Temp:    SpO2: 97% 95%    Last Pain:  Vitals:   07/17/19 1600  PainSc: Asleep                 Maanya Hippert,W. EDMOND

## 2019-07-17 NOTE — Op Note (Signed)
Date of surgery: 07/17/2019 Preoperative diagnosis: Lumbar spinal stenosis L1-L2 3 L3-4 with neurogenic claudication, lumbar radiculopathy. Postoperative diagnosis: Same Procedure: Anterolateral decompression L1-L2 3 L3-4 using XLIF spacer and allograft arthrodesis with ostia cell posterior segmental fixation L1 L4 with pedicle screws placed with robotic assistance neuro monitoring with fluoroscopic imaging. Surgeon: [name] First Assistant: Hoyt Koch, MD Anesthesia: General endotracheal Indications: Vernon Ruiz is a 69 year old individual who underwent a decompression at L3-4 for disc herniation.  He had a foot drop but after this his symptoms seem to get worse with worsening foot drop and worsening pain ultimately was noted on a myelogram recently performed that he has severe stenosis at L1-L2 3 and L3-4 is been advised regarding the need for surgical decompression stabilization using an indirect technique with an anterolateral decompression.  Procedure: Patient was brought to the operating room supine on a stretcher.  After the smooth induction of general endotracheal anesthesia is carefully turned to the right lateral decubitus position and EMG monitoring was performed on the major muscle groups of the lumbar plexus.  Then by securing his body in orthogonal position checking this radiographically we used fluoroscopic imaging with less right technique to isolate the L2-3 L3-4 and L1-2 levels.  The skin was marked on the lateral aspect for entry into those disc spaces and then after prepping with alcohol DuraPrep and draping in a sterile fashion a linear incision was made over the chosen area of entry a posterior monitoring incision was created to allow placement of a blunt probe into the retroperitoneal space the retroperitoneal space was accessed through the monitoring incision and a finger was used to guide a blunt probe into the retroperitoneal space and then onto the psoas muscle at L3-4  first.  The interspace was identified and a K wire was passed into it monitoring was performed identified minimal aggravation of any of the lumbar plexus musculature then by placing a series of dilators were able to place 130 mm long retractor into the space and open it to expose the disc space itself.  Bipolar cautery was used to cut through some of the slips of psoas muscle that were remained.  This space was opened and a combination of curettes and rongeurs and Cobb elevators was then used to open the disc space and open the contralateral annular ligament.  Once this was performed a gross discectomy was performed removing substantial quantities of severely degenerated desiccated disc material from the L3-4 space ultimately the interspace was sized for an appropriate size spacer and after decorticating the endplates it was felt that a 14 x 22 x 60 mm peek spacer with 10 degrees lordosis would fit best into this interval this was packed with osteocell allograft and was placed into the interspace under direct visualization.  The retractor was removed final radiographs were obtained and then the attention was turned to L to L3-4 similar process was carried out here 14 x 22 x 55 mm spacer with 10 degrees lordosis was fitted into the interval these were peek spacers fitted with osteocell allograft that was used to occupy the entirety of the space.  At L1-2 a similar discectomy was performed but here a 16 x 22 x 55 mm spacer was placed with 10 degrees lordosis.  Once the spacers were all placed in the lower last dose this was set the area was carefully checked for hemostasis and the wounds were closed with 2-0 Vicryl in the fascia 3-0 Vicryl subcuticularly and Dermabond on the skin.  Monitoring wound was closed in a similar fashion.  Patient was then turned prone onto the [facility] table.  With the patient being placed prone onto the [facility] table the back was prepped with alcohol DuraPrep and draped in a sterile  fashion during this time Dr. Duffy Rhody that helped with the completion of the lateral portion of the operation a start of the posterior portion of the operation the robotic arm was attached to the operating table.  Then by securing the robotic arm to the patient with a Steinmann pin in the left posterior suprailiac crest we were able to obtain registration films to register the chosen images with the patient's spine and review and endorse the preoperative plan for placement of screws from L1-L4.  Once the plan was approved we chose entry sites on the skin at L1-3 and 4 on either side and then made a corresponding incisions with the L3-4 screws being fairly close together a singular incision was made there and individual incisions were made for the L1 and L2 screws the skin was infiltrated with lidocaine with epinephrine for a total of 20 cc.  Once the incisions were made then we passed the K wires into the pedicles through the chosen trajectories of the robotic arm and the K wires were secured with a hairpin clamp.  Then sequentially after verifying that the wires were placed squarely into the pedicles with radiographs we placed pedicle screws using an EMG monitor on the screw shank to make sure that no involvement of the lumbar plexus roots were obtained.  7.5 x 45 mm screws were placed at L4 and 6.5 x 45 mm screws were placed in L3 L2 and L1 no cut out was verified electronically and final radiographs identified good position of the screws then 110 mm rod was passed between the screws on the right and the screws on the left in a neutral construct and the system was tightened in a neutral construct.  Final radiographs were obtained showing good placement of the hardware.  With the towers then being removed after torquing the hardware into its final position the wounds were closed with 2-0 Vicryl in interrupted fashion the fascia 3-0 Vicryl in the subcutaneous tissues and 4-0 Vicryl in the subcuticular tissues  Dermabond was placed on the skin for the entirety of the procedure 250 cc blood loss was encountered patient tolerated procedure well is returned to recovery room in stable condition.

## 2019-07-17 NOTE — Anesthesia Procedure Notes (Signed)
Arterial Line Insertion Start/End4/09/2019 7:20 AM, 07/17/2019 7:30 AM  Patient location: Pre-op. Preanesthetic checklist: patient identified, IV checked, site marked, risks and benefits discussed, surgical consent, monitors and equipment checked, pre-op evaluation, timeout performed and anesthesia consent Lidocaine 1% used for infiltration Left, radial was placed Catheter size: 20 G Hand hygiene performed  and maximum sterile barriers used   Attempts: 1 Procedure performed without using ultrasound guided technique. Following insertion, dressing applied and Biopatch. Post procedure assessment: normal and unchanged  Patient tolerated the procedure well with no immediate complications.

## 2019-07-17 NOTE — Progress Notes (Signed)
Orthopedic Tech Progress Note Patient Details:  Vernon Ruiz 1951/01/13 [id number] Fitted patient for back brace Patient ID: [name], male   DOB: 1950/04/30, 69 y.o.   MRN: [id number]   [name] 07/17/2019, 7:04 PM

## 2019-07-18 ENCOUNTER — Encounter: Payer: Self-pay | Admitting: *Deleted

## 2019-07-18 LAB — BASIC METABOLIC PANEL
Anion gap: 11 (ref 5–15)
BUN: 20 mg/dL (ref 8–23)
CO2: 24 mmol/L (ref 22–32)
Calcium: 9 mg/dL (ref 8.9–10.3)
Chloride: 102 mmol/L (ref 98–111)
Creatinine, Ser: 0.87 mg/dL (ref 0.61–1.24)
GFR calc Af Amer: >60 mL/min (ref >60)
GFR calc non Af Amer: >60 mL/min (ref >60)
Glucose, Bld: 99 mg/dL (ref 70–99)
Potassium: 3.8 mmol/L (ref 3.5–5.1)
Sodium: 137 mmol/L (ref 135–145)

## 2019-07-18 LAB — GLUCOSE, CAPILLARY
Glucose-Capillary: 159 mg/dL — ABNORMAL HIGH (ref 70–99)
Glucose-Capillary: 133 mg/dL — ABNORMAL HIGH (ref 70–99)
Glucose-Capillary: 110 mg/dL — ABNORMAL HIGH (ref 70–99)
Glucose-Capillary: 160 mg/dL — ABNORMAL HIGH (ref 70–99)

## 2019-07-18 NOTE — Evaluation (Signed)
Physical Therapy Evaluation Patient Details Name: Vernon Ruiz MRN: [id number] DOB: [birth year] Today's Date: 07/18/2019   History of Present Illness  Vernon Ruiz is a 69 year old individual who had a herniated nucleus pulposus at the level of L2-3 on the right side.  He had developed a foot drop that was initially rather painless however as he became progressively weaker. Pt underwent anterolateral decompression L1-2, L3-4 on 07/17/19. PMH: CAD, DM, obstructive sleep apnea PSH: AVR, s/p cervical surgery    Clinical Impression  Patient is s/p above surgery resulting in the deficits listed below (see PT Problem List). Pt anxious about returning home as pt's wife can't "lift him" however pt has 37yo grandson that stays with him that can assist. Patient will benefit from skilled PT to increase their independence and safety with mobility (while adhering to their precautions) to allow discharge to the venue listed below.     Follow Up Recommendations Home health PT;Supervision/Assistance - 24 hour    Equipment Recommendations  3in1 (PT)(has RW at home)    Recommendations for Other Services       Precautions / Restrictions Precautions Precautions: Back Precaution Booklet Issued: Yes (comment) Precaution Comments: pt educatead on BLT, verbally confirmed understanding however required verbal cues to adhere functionally Restrictions Weight Bearing Restrictions: No      Mobility  Bed Mobility Overal bed mobility: Needs Assistance Bed Mobility: (P) Sit to Sidelying;Rolling Rolling: Mod assist Sidelying to sit: Mod assist       General bed mobility comments: max directional verbal cues, modA for trunk elevation, pt able to bring LEs off EOB  Transfers Overall transfer level: Needs assistance Equipment used: Rolling walker (2 wheeled) Transfers: Sit to/from Stand Sit to Stand: Min assist;Mod assist         General transfer comment: max directional verbal cues for hand placement, minA  to power up, modA to steady during transition of hands from bed to chair, verbal cues to minimize  trunk flexion  Ambulation/Gait Ambulation/Gait assistance: Supervision Gait Distance (Feet): 175 Feet Assistive device: Rolling walker (2 wheeled) Gait Pattern/deviations: Step-through pattern;Decreased stride length;Decreased dorsiflexion - right(has R afo) Gait velocity: slow Gait velocity interpretation: <1.31 ft/sec, indicative of household ambulator General Gait Details: verbal cues to stand in RW and upright vs pushing it out in front of him  Stairs Stairs: Yes Stairs assistance: Min guard Stair Management: One rail Right;Step to pattern;Sideways Number of Stairs: 4 General stair comments: pt with good return demonstration, no episode of LOB  Wheelchair Mobility    Modified Rankin (Stroke Patients Only)       Balance Overall balance assessment: Needs assistance Sitting-balance support: No upper extremity supported;Feet supported Sitting balance-Leahy Scale: Fair     Standing balance support: Bilateral upper extremity supported Standing balance-Leahy Scale: Fair Standing balance comment: dependent on RW for safe amb                             Pertinent Vitals/Pain Pain Assessment: 0-10 Pain Score: 7  Pain Location: back Pain Descriptors / Indicators: Operative site guarding Pain Intervention(s): Limited activity within patient's tolerance;Monitored during session;Premedicated before session    Home Living Family/patient expects to be discharged to:: Private residence Living Arrangements: Spouse/significant other Available Help at Discharge: Family;Available 24 hours/day Type of Home: House Home Access: Stairs to enter Entrance Stairs-Rails: Right;Left;Can reach both Entrance Stairs-Number of Steps: +6 +5 front; garage +7 steps Home Layout: One level Home Equipment: Walker - 2  wheels;Cane - single point(shoe horn) Additional Comments: pt states 22yo  grandson is living with him as well    Prior Function Level of Independence: Independent         Comments: did need     Hand Dominance   Dominant Hand: Right    Extremity/Trunk Assessment   Upper Extremity Assessment Upper Extremity Assessment: Overall WFL for tasks assessed    Lower Extremity Assessment Lower Extremity Assessment: RLE deficits/detail RLE Deficits / Details: R drop foot, wears AFO, otherwise grossly 4/5    Cervical / Trunk Assessment Cervical / Trunk Assessment: Other exceptions Cervical / Trunk Exceptions: back surgery  Communication   Communication: No difficulties  Cognition Arousal/Alertness: Awake/alert Behavior During Therapy: WFL for tasks assessed/performed(anxious about going home in sense that wife can't "lift" him) Overall Cognitive Status: Within Functional Limits for tasks assessed                                 General Comments: pt appears self limiting      General Comments General comments (skin integrity, edema, etc.): pt incision observed, no drainage    Exercises     Assessment/Plan    PT Assessment Patient needs continued PT services  PT Problem List Decreased strength;Decreased range of motion;Decreased activity tolerance;Decreased balance;Decreased mobility;Decreased knowledge of use of DME;Pain       PT Treatment Interventions DME instruction;Gait training;Stair training;Functional mobility training;Therapeutic activities;Therapeutic exercise;Balance training    PT Goals (Current goals can be found in the Care Plan section)  Acute Rehab PT Goals Patient Stated Goal: get better PT Goal Formulation: With patient Time For Goal Achievement: 08/01/19 Potential to Achieve Goals: Good    Frequency Min 5X/week   Barriers to discharge        Co-evaluation               AM-PAC PT "6 Clicks" Mobility  Outcome Measure Help needed turning from your back to your side while in a flat bed without using  bedrails?: A Little Help needed moving from lying on your back to sitting on the side of a flat bed without using bedrails?: A Lot Help needed moving to and from a bed to a chair (including a wheelchair)?: A Little Help needed standing up from a chair using your arms (e.g., wheelchair or bedside chair)?: A Little Help needed to walk in hospital room?: A Little Help needed climbing 3-5 steps with a railing? : A Little 6 Click Score: 17    End of Session Equipment Utilized During Treatment: Gait belt;Back brace Activity Tolerance: Patient tolerated treatment well Patient left: in bed;with call bell/phone within reach(sitting EOB with OT present) Nurse Communication: Mobility status PT Visit Diagnosis: Unsteadiness on feet (R26.81);Difficulty in walking, not elsewhere classified (R26.2);Pain Pain - part of body: (back)    Time: 2130-8657 PT Time Calculation (min) (ACUTE ONLY): 26 min   Charges:   PT Evaluation $PT Eval Moderate Complexity: 1 Mod PT Treatments $Gait Training: 8-22 mins        Lewis Shock, PT, DPT Acute Rehabilitation Services Pager #: [phone] Office #: [phone]   Iona Hansen 07/18/2019, 9:20 AM

## 2019-07-18 NOTE — Evaluation (Signed)
Occupational Therapy Evaluation Patient Details Name: Vernon Ruiz MRN: [id number] DOB: [birth year] Today's Date: 07/18/2019    History of Present Illness Vernon Ruiz is a 69 year old individual who had a herniated nucleus pulposus at the level of L2-3 on the right side.  He had developed a foot drop that was initially rather painless however as he became progressively weaker. Pt underwent anterolateral decompression L1-2, L3-4 on 07/17/19. PMH: CAD, DM, obstructive sleep apnea PSH: AVR, s/p cervical surgery   Clinical Impression   PTA, pt was living at home with his wife and 63 year old grandson, pt reports he was independent with ADL/IADL and functional mobility . Pt currently requires modA for bed mobility, minA for LB dressing with AE, and minA for functional mobility at RW level. Pt appears anxious about returning home and reports his wife is unable to physically assist him. However, his 41 year old grandson is able to provide assistance. Due to decline in current level of function, pt would benefit from acute OT to address established goals to facilitate safe D/C to venue listed below. At this time, recommend HHOT follow-up. Will continue to follow acutely.     Follow Up Recommendations  Home health OT;Supervision/Assistance - 24 hour    Equipment Recommendations  3 in 1 bedside commode    Recommendations for Other Services       Precautions / Restrictions Precautions Precautions: Back Precaution Booklet Issued: Yes (comment) Precaution Comments: pt educatead on BLT, verbally confirmed understanding however required verbal cues to adhere functionally Restrictions Weight Bearing Restrictions: No      Mobility Bed Mobility Overal bed mobility: Needs Assistance Bed Mobility: Sit to Sidelying;Rolling Rolling: Mod assist Sidelying to sit: Mod assist     Sit to sidelying: Mod assist General bed mobility comments: max directional verbal cues, modA for BLE  management  Transfers Overall transfer level: Needs assistance Equipment used: Rolling walker (2 wheeled) Transfers: Sit to/from Stand Sit to Stand: Mod assist         General transfer comment: max directional verbal cues for hand placement, modA to power up, modA to steady during transition of hands from bed to chair, verbal cues to minimize  trunk flexion    Balance Overall balance assessment: Needs assistance Sitting-balance support: No upper extremity supported;Feet supported Sitting balance-Leahy Scale: Fair     Standing balance support: Bilateral upper extremity supported Standing balance-Leahy Scale: Fair Standing balance comment: dependent on RW for safe amb                           ADL either performed or assessed with clinical judgement   ADL Overall ADL's : Needs assistance/impaired Eating/Feeding: Set up;Sitting   Grooming: Minimal assistance;Standing   Upper Body Bathing: Min guard;Sitting   Lower Body Bathing: Moderate assistance;Sit to/from stand   Upper Body Dressing : Minimal assistance;Sitting   Lower Body Dressing: Moderate assistance;Sit to/from stand Lower Body Dressing Details (indicate cue type and reason): able to return demonstrate use of sock aide and reacher to assist with LB dressing with minA Toilet Transfer: Minimal assistance;RW Toilet Transfer Details (indicate cue type and reason): simulated Toileting- Clothing Manipulation and Hygiene: Moderate assistance;Sit to/from stand       Functional mobility during ADLs: Rolling walker;Minimal assistance General ADL Comments: pt limited by pain and decreased activity tolerance     Vision Baseline Vision/History: Wears glasses Wears Glasses: At all times Patient Visual Report: No change from baseline       Perception  Praxis      Pertinent Vitals/Pain Pain Assessment: 0-10 Pain Score: 7  Pain Location: back Pain Descriptors / Indicators: Operative site guarding Pain  Intervention(s): Limited activity within patient's tolerance;Monitored during session;Premedicated before session     Hand Dominance Right   Extremity/Trunk Assessment Upper Extremity Assessment Upper Extremity Assessment: Overall WFL for tasks assessed   Lower Extremity Assessment Lower Extremity Assessment: RLE deficits/detail RLE Deficits / Details: R drop foot, wears AFO, otherwise grossly 4/5   Cervical / Trunk Assessment Cervical / Trunk Assessment: Other exceptions Cervical / Trunk Exceptions: back surgery   Communication Communication Communication: No difficulties   Cognition Arousal/Alertness: Awake/alert Behavior During Therapy: WFL for tasks assessed/performed(anxious about going home in sense that wife can't "lift" him) Overall Cognitive Status: Within Functional Limits for tasks assessed                                 General Comments: pt appears self limiting   General Comments  incision site without drainage    Exercises Exercises: Other exercises Other Exercises Other Exercises: educated pt on importance of mobiltiy and limiting sitting duration   Shoulder Instructions      Home Living Family/patient expects to be discharged to:: Private residence Living Arrangements: Spouse/significant other Available Help at Discharge: Family;Available 24 hours/day Type of Home: House Home Access: Stairs to enter [employer] of Steps: +6 +5 front; garage +7 steps Entrance Stairs-Rails: Right;Left;Can reach both Home Layout: One level     Bathroom Shower/Tub: Walk-in shower;Tub/shower unit   Bathroom Toilet: Standard     Home Equipment: Environmental consultant - 2 wheels;Cane - single point(shoe horn)   Additional Comments: pt states 22yo grandson is living with him as well      Prior Functioning/Environment Level of Independence: Independent        Comments: did need assist with donning AFO        OT Problem List: Decreased activity  tolerance;Decreased range of motion;Impaired balance (sitting and/or standing);Decreased safety awareness;Decreased knowledge of use of DME or AE;Decreased knowledge of precautions;Pain      OT Treatment/Interventions: Self-care/ADL training;Therapeutic exercise;DME and/or AE instruction;Therapeutic activities;Patient/family education;Balance training    OT Goals(Current goals can be found in the care plan section) Acute Rehab OT Goals Patient Stated Goal: to go home OT Goal Formulation: With patient Time For Goal Achievement: 08/01/19 Potential to Achieve Goals: Good ADL Goals Pt Will Perform Grooming: with modified independence;standing Pt Will Perform Lower Body Dressing: with modified independence;sit to/from stand;with adaptive equipment Pt Will Transfer to Toilet: with modified independence;ambulating Pt Will Perform Tub/Shower Transfer: Shower transfer;with modified independence Additional ADL Goal #1: Pt will demonstrate independence with adherence to 3/3 back precautions during bed mobility, ADL/IADL and functional mobiltiy.  OT Frequency: Min 2X/week   Barriers to D/C: Decreased caregiver support  wife is unable to assist pt       Co-evaluation              AM-PAC OT "6 Clicks" Daily Activity     Outcome Measure Help from another person eating meals?: A Little Help from another person taking care of personal grooming?: A Little Help from another person toileting, which includes using toliet, bedpan, or urinal?: A Lot Help from another person bathing (including washing, rinsing, drying)?: A Lot Help from another person to put on and taking off regular upper body clothing?: A Little Help from another person to put on and taking off  regular lower body clothing?: A Lot 6 Click Score: 15   End of Session Equipment Utilized During Treatment: Gait belt;Back brace;Rolling walker Nurse Communication: Mobility status  Activity Tolerance: Patient limited by pain;Patient  tolerated treatment well Patient left: in bed;with call bell/phone within reach  OT Visit Diagnosis: Unsteadiness on feet (R26.81);Other abnormalities of gait and mobility (R26.89);Pain Pain - part of body: (back incisional site)                Time: 8338-2505 OT Time Calculation (min): 23 min Charges:  OT General Charges $OT Visit: 1 Visit OT Evaluation $OT Eval Low Complexity: 1 Low OT Treatments $Self Care/Home Management : 8-22 mins  Helene Kelp OTR/L Acute Rehabilitation Services Office: [place] 07/18/2019, 9:30 AM

## 2019-07-18 NOTE — Progress Notes (Signed)
Patient ID: Vernon Ruiz, male   DOB: 10/26/1950, 69 y.o.   MRN: [id number] Vital signs are stable Motor function is good foot drop on the right and unchanged Patient feels unsteady on his feet He may need some comprehensive inpatient rehabilitation I will ask rehab to see him Blood sugar this morning is 159 Blood sugars have been somewhat labile Last diabetic coordinator to visit with him.  We will check be met also look at renal function.

## 2019-07-18 NOTE — TOC Initial Note (Addendum)
Transition of Care [facility]) - Initial/Assessment Note    Patient Details  Name: Vernon Ruiz MRN: [id number] Date of Birth: 1951/04/11  [facility] CM/SW Contact:    Beckie Busing, RN Phone Number: 07/18/2019, 1:39 PM  Clinical Narrative:   Choices offered to patient for Home health services. HH PT/OT has been set up through [facility] per patients request. Patient to be discharged home with [facility] services  and DME.                  Expected Discharge Plan: Home/Self Care Barriers to Discharge: Continued Medical Work up   Patient Goals and CMS Choice   CMS Medicare.gov Compare Post Acute Care list provided to:: Patient Choice offered to / list presented to : Patient  Expected Discharge Plan and Services Expected Discharge Plan: Home/Self Care     Post Acute Care Choice: Home Health                             HH Arranged: PT, OT [facility] Agency: [facility] Date [facility] Agency Contacted: 07/18/19 Time HH Agency Contacted: [phone] Representative spoke with at [facility] Agency: Kenard Gower  Prior Living Arrangements/Services     Patient language and need for interpreter reviewed:: Yes Do you feel safe going back to the place where you live?: Yes      Need for Family Participation in Patient Care: Yes (Comment) Care giver support system in place?: Yes (comment)   Criminal Activity/Legal Involvement Pertinent to Current Situation/Hospitalization: No - Comment as needed  Activities of Daily Living      Permission Sought/Granted                  Emotional Assessment   Attitude/Demeanor/Rapport: Gracious Affect (typically observed): Pleasant Orientation: : Oriented to Self, Oriented to Place, Oriented to  Time, Oriented to Situation Alcohol / Substance Use: Not Applicable Psych Involvement: No (comment)  Admission diagnosis:  Lumbar radiculopathy [M54.16] Patient Active Problem List   Diagnosis Date Noted  . Lumbar radiculopathy 07/17/2019  . Hyperlipidemia  05/08/2019  . COVID-19 03/14/2019  . Right carotid bruit 12/12/2018  . CVA (cerebral vascular accident) (HCC) 12/12/2018  . Right foot drop 11/07/2018  . HTN (hypertension) 07/27/2017  . S/P AVR 10/04/2016  . OSA (obstructive sleep apnea) 01/20/2016  . Severe aortic stenosis 01/29/2014  . Paroxysmal atrial fibrillation (HCC) 01/29/2014  . Abdominal pain 01/29/2014   PCP:  [name], MD Pharmacy:   [facility] [phone] - [place], [place] - [address] AT [facility] [address] [place] [address] Phone: [phone] Fax: [phone]     Social Determinants of Health (SDOH) Interventions    Readmission Risk Interventions No flowsheet data found.

## 2019-07-18 NOTE — Progress Notes (Addendum)
Inpatient Diabetes Program Recommendations  AACE/ADA: New Consensus Statement on Inpatient Glycemic Control  Target Ranges:  Prepandial:   less than 140 mg/dL      Peak postprandial:   less than 180 mg/dL (1-2 hours)      Critically ill patients:  140 - 180 mg/dL  Results for Vernon Ruiz, Vernon Ruiz (MRN [id number]) as of 07/18/2019 11:34  Ref. Range 07/17/2019 06:06 07/17/2019 9:02 07/17/2019 14:31 07/17/2019 18:16 07/17/2019 20:45 07/17/2019 21:06 07/18/2019 06:13 07/18/2019 7:27 07/18/2019 9:52  Glucose-Capillary Latest Ref Range: 70 - 99 mg/dL 892 (H)    Decadron 5 mg  212 (H)  Novolog 7 units 220 (H)  Novolog 7 units  Metformin 1000 mg      Amaryl 4 mg 220 (H)  Novolog 2 units 159 (H)     Invokana 100 mg   Novolog 4 units  Metformin 1000 mg      Amaryl 4 mg  Lantus 35 units   Results for Vernon, Ruiz (MRN [id number]) as of 07/18/2019 11:34  Ref. Range 03/15/2019 00:18 07/13/2019 12:15  Hemoglobin A1C Latest Ref Range: 4.8 - 5.6 % 7.9 (H) 7.7 (H)   Review of Glycemic Control  Diabetes history: DM2 Outpatient Diabetes medications: Toujeo 30 units daily, Amaryl 4 mg BID, Jardiance 12.5 mg daily, Metformin XR 1000 mg BID, Trulicity 1.5 mg Qweek (Sunday) Current orders for Inpatient glycemic control: Lantus 35 units daily, Novolog 0-20 units TID with meals, Novolog 0-5 units QHS, Invokana 100 mg daily, Amaryl 4 mg BID, Metformin XR 1000 mg BID, Trulicity 1.5 mg Qweek  Inpatient Diabetes Program Recommendations:     Insulin-Basal: Please consider decreasing Lantus to 30 units daily to start 07/19/19 (patient reports he takes Toujeo 30 units daily at home).  Injectable: Please discontinue Trulicity while inpatient as medication is not on formulary.  A1C: A1C 7.7% on 07/13/19 indicating an average glucose of 174 mg/dl over the past 2-3 months.  NOTE: Spoke with patient over the phone about diabetes and home regimen for diabetes control. Patient reports being followed by Endcrinologist for  diabetes management and currently taking Toujeo 30 units daily, Amaryl 4 mg BID, Jardiance 12.5 mg daily, Metformin XR 1000 mg BID, and Trulicity 1.5 mg Qweek (Sunday). Patient states that he takes DM medications as prescribed. Patient checks glucose once a day in the morning and he notes that over the past 2 weeks his glucose has ranged from 98-170 mg/dl.  Inquired about last A1C and patient states that his last A1C was 9.1% around the first of March. Patient notes that he had COVID in December and was given steroids and he has also taken steroids at home after he was discharged from the hospital. Discussed current A1C 7.7% on 07/13/19 indicating an average glucose of 174 mg/dl. Discussed current inpatient orders for glycemic control. Informed patient that he received Lantus 35 units this morning already. Also informed patient that he received one time Decadron 5 mg on 07/17/19 which is contributing to hyperglycemia noted yesterday.  Informed patient that it would be requested to decrease Lantus to 30 units daily and glucose trends will be followed to determine if additional changes need to be made while inpatient. Encouraged patient to call nursing staff if he felt his glucose was getting to low so it could be treated. Patient verbalized understanding of information discussed and reports no further questions at this time related to diabetes. Will continue to follow along and make further recommendations if needed.  Thanks, Orlando Penner, RN, MSN,  CDE Diabetes Coordinator Inpatient Diabetes Program [phone] (Team Pager)

## 2019-07-19 LAB — GLUCOSE, CAPILLARY
Glucose-Capillary: 138 mg/dL — ABNORMAL HIGH (ref 70–99)
Glucose-Capillary: 131 mg/dL — ABNORMAL HIGH (ref 70–99)
Glucose-Capillary: 129 mg/dL — ABNORMAL HIGH (ref 70–99)
Glucose-Capillary: 68 mg/dL — ABNORMAL LOW (ref 70–99)
Glucose-Capillary: 100 mg/dL — ABNORMAL HIGH (ref 70–99)

## 2019-07-19 MED ORDER — MAGNESIUM CITRATE PO SOLN
1.0000 | Freq: Once | ORAL | Status: AC
  Administered 2019-07-19: 1 via ORAL
  Filled 2019-07-19: qty 296

## 2019-07-19 MED ORDER — APIXABAN 5 MG PO TABS
5.0000 mg | ORAL_TABLET | Freq: Two times a day (BID) | ORAL | Status: DC
  Administered 2019-07-19 (×2): 5 mg via ORAL
  Filled 2019-07-19 (×3): qty 1

## 2019-07-19 MED ORDER — HYDROXYZINE HCL 50 MG/ML IM SOLN
50.0000 mg | Freq: Four times a day (QID) | INTRAMUSCULAR | Status: DC | PRN
  Filled 2019-07-19: qty 1

## 2019-07-19 MED ORDER — INSULIN GLARGINE 100 UNIT/ML ~~LOC~~ SOLN
30.0000 [IU] | Freq: Every day | SUBCUTANEOUS | Status: DC
  Filled 2019-07-19: qty 0.3

## 2019-07-19 NOTE — Progress Notes (Signed)
ANTICOAGULATION CONSULT NOTE - Initial Consult  Pharmacy Consult for Eliquis (on prior to admit) Indication:   H/o Afib  Allergies  Allergen Reactions  . Penicillins Itching, Rash and Other (See Comments)    All over body Did it involve swelling of the face/tongue/throat, SOB, or low BP? No Did it involve sudden or severe rash/hives, skin peeling, or any reaction on the inside of your mouth or nose? Yes Did you need to seek medical attention at a hospital or doctor's office? No When did it last happen?20 + years If all above answers are "NO", may proceed with cephalosporin use.   . Sulfa Antibiotics Rash    Rash    Patient Measurements: Height: 5\' 5"  (165.1 cm) Weight: 89.2 kg (196 lb 11.2 oz) IBW/kg (Calculated) : 61.5   Vital Signs: Temp: 98.2 F (36.8 C) (04/08 0759) Temp Source: Oral (04/08 0759) BP: 180/89 (04/08 0759) Pulse Rate: 81 (04/08 0759)  Labs: Recent Labs    07/17/19 0550 07/18/19 1050  LABPROT 12.9  --   INR 1.0  --   CREATININE  --  0.87   Preop 07/13/19:  Hgb 15.3, Hct 48.8, PLTC 276k   Estimated Creatinine Clearance: 84.6 mL/min (by C-G formula based on SCr of 0.87 mg/dL).   Medical History: Past Medical History:  Diagnosis Date  . Aortic stenosis    s/p AVR using a 48mm Edwards Magna-Ease pericardial valve 10/04/16  . Coronary artery disease   . Diabetes mellitus without complication (HCC)   . GERD (gastroesophageal reflux disease)   . Heart murmur   . Hypertension   . Inguinal hernia 1970s  . Obstructive sleep apnea    positive from a home test, still needs to get another study done  . Persistent atrial fibrillation (HCC)    s/p clipping of LA appendage 10/04/16  . Right carotid bruit 12/12/2018  . Right foot drop 11/07/2018  . Stroke [facility]) 12/12/2018    Assessment: 69 y.o male admitted 4/6 with lumbar spinal stenosis. S/p lumbar decompression surgery on 07/17/19.  He was on chronic Eliquis 5 mg BID prior to admit for Afib with  CHADS2VASc score of 6 (age, HTN, DM, CAD, stroke in Sept 2020), and s/p AVR. Eliquis was held PTA for surgery, last dose taken 4/2.  He was on lovenox bridge 120 mg q24h for 3 days PTA thru 07/15/19 per outpatient cardiology/pharmacist.    POD #2 , neurosurgery consulted pharmacy to resume Eliquis.   Goal of Therapy:  Monitor platelets by anticoagulation protocol: Yes   Plan:  Resume PTA Eliquis 5 mg BID Monitor for signs /symptoms of bleeding.   09/14/19, RPh Clinical Pharmacist  Please check AMION for all [facility] phone numbers After 10:00 PM, call Main Pharmacy [phone] 07/19/2019,11:20 AM

## 2019-07-19 NOTE — Progress Notes (Signed)
Occupational Therapy Evaluation Patient Details Name: JEROME VIGLIONE MRN: [id number] DOB: Apr 20, 1950 Today's Date: 07/19/2019    History of Present Illness Rhyse Loux is a 69 year old individual who had a herniated nucleus pulposus at the level of L2-3 on the right side.  He had developed a foot drop that was initially rather painless however as he became progressively weaker. Pt underwent anterolateral decompression L1-2, L3-4 on 07/17/19. PMH: CAD, DM, obstructive sleep apnea PSH: AVR, s/p cervical surgery   Clinical Impression   Pt making steady progress toward mobility and ADL. Educated pt on use of AE to assist with ADL. Requires minguard A with mobility and Min A with LB ADL with use of AE. Continue to recommend [place] after DC.    Follow Up Recommendations  Home health OT;Supervision/Assistance - 24 hour    Equipment Recommendations  3 in 1 bedside commode    Recommendations for Other Services       Precautions / Restrictions Precautions Precautions: Back;Fall Other Brace: R AFO Restrictions Weight Bearing Restrictions: No      Mobility Bed Mobility Overal bed mobility: Needs Assistance   Rolling: Supervision Sidelying to sit: Min assist       General bed mobility comments: vc for technique  Transfers Overall transfer level: Needs assistance Equipment used: Rolling walker (2 wheeled) Transfers: Sit to/from Stand Sit to Stand: Min guard              Balance Overall balance assessment: Needs assistance   Sitting balance-Leahy Scale: Fair       Standing balance-Leahy Scale: Poor                             ADL either performed or assessed with clinical judgement   ADL       Grooming: Supervision/safety;Set up;Standing       Lower Body Bathing: Minimal assistance;Cueing for back precautions;With adaptive equipment       Lower Body Dressing: Minimal assistance;Sit to/from stand;Cueing for back precautions;Cueing for safety;With  adaptive equipment   Toilet Transfer: Min guard;Ambulation;RW   Toileting- Clothing Manipulation and Hygiene: Moderate assistance;Sit to/from stand(Educated on use of toilet tongs)         General ADL Comments: Pt given information on ordering AE     Vision         Perception     Praxis      Pertinent Vitals/Pain Pain Assessment: 0-10 Pain Score: 5  Pain Location: back Pain Descriptors / Indicators: Discomfort;Operative site guarding Pain Intervention(s): Limited activity within patient's tolerance     Hand Dominance     Extremity/Trunk Assessment Upper Extremity Assessment Upper Extremity Assessment: Overall WFL for tasks assessed   Lower Extremity Assessment Lower Extremity Assessment: Defer to PT evaluation       Communication     Cognition Arousal/Alertness: Awake/alert Behavior During Therapy: WFL for tasks assessed/performed Overall Cognitive Status: Within Functional Limits for tasks assessed                                     General Comments       Exercises     Shoulder Instructions      Home Living  Prior Functioning/Environment                   OT Problem List:        OT Treatment/Interventions:      OT Goals(Current goals can be found in the care plan section) Acute Rehab OT Goals Patient Stated Goal: to go home tomorrow OT Goal Formulation: With patient Time For Goal Achievement: 08/01/19 Potential to Achieve Goals: Good ADL Goals Pt Will Perform Grooming: with modified independence;standing Pt Will Perform Lower Body Dressing: with modified independence;sit to/from stand;with adaptive equipment Pt Will Transfer to Toilet: with modified independence;ambulating Pt Will Perform Tub/Shower Transfer: Shower transfer;with modified independence Additional ADL Goal #1: Pt will demonstrate independence with adherence to 3/3 back precautions during bed  mobility, ADL/IADL and functional mobiltiy.  OT Frequency: Min 2X/week   Barriers to D/C:            Co-evaluation              AM-PAC OT "6 Clicks" Daily Activity     Outcome Measure Help from another person eating meals?: None Help from another person taking care of personal grooming?: A Little Help from another person toileting, which includes using toliet, bedpan, or urinal?: A Lot Help from another person bathing (including washing, rinsing, drying)?: A Little Help from another person to put on and taking off regular upper body clothing?: A Little Help from another person to put on and taking off regular lower body clothing?: A Lot 6 Click Score: 17   End of Session Equipment Utilized During Treatment: Rolling walker Nurse Communication: Mobility status  Activity Tolerance: Patient tolerated treatment well Patient left: in chair;with call bell/phone within reach  OT Visit Diagnosis: Unsteadiness on feet (R26.81);Other abnormalities of gait and mobility (R26.89);Pain Pain - part of body: (back)                Time: 2440-1027 OT Time Calculation (min): 29 min Charges:  OT General Charges $OT Visit: 1 Visit OT Treatments $Self Care/Home Management : 23-37 mins  Luisa Dago, OT/L   Acute OT Clinical Specialist Acute Rehabilitation Services Pager [phone] Office [phone]   [facility] 07/19/2019, 1:29 PM

## 2019-07-19 NOTE — Progress Notes (Addendum)
Physical Therapy Treatment Patient Details Name: [name] MRN: [id number] DOB: 16-Apr-1950 Today's Date: 07/19/2019    History of Present Illness Vernon Ruiz is a 69 year old individual who had a herniated nucleus pulposus at the level of L2-3 on the right side.  He had developed a foot drop that was initially rather painless however as he became progressively weaker. Pt underwent anterolateral decompression L1-2, L3-4 on 07/17/19. PMH: CAD, DM, obstructive sleep apnea PSH: AVR, s/p cervical surgery    PT Comments    Pt is progressing with functional mobility and activity tolerance. He was able to ambulate ~300' with min guard and RW, requiring occasional VC for RW proximity and to maintain upright trunk. Pt requires minA with bed mobility, requiring assist with R LE management. Pt with good carryover for stair training, needing min guard for safety. Educated pt on back precautions during functional mobility and importance of walking program. Pt verbalized understanding yet required occasional cues for adherence. Pt concerned about returning home as "wife has back problems and cannot help much" and grandson "wont help, he has other things to do". Reassured pt that he is doing well and will require minimal assistance.   Recommend d/c home with [facility] PT and family support once medically appropriate. Pt would continue to benefit from skilled physical therapy services at this time while admitted and after d/c to address the below listed limitations in order to improve overall safety and independence with functional mobility.   Follow Up Recommendations  Home health PT;Supervision/Assistance - 24 hour     Equipment Recommendations  3in1 (PT);Other (comment)(Has RW at home)    Recommendations for Other Services       Precautions / Restrictions Precautions Precautions: Back Precaution Booklet Issued: Yes (comment) Precaution Comments: Educated and demonstrated back precautions. Pt able to recall  precautions but requires occasional cues to adhere functionally Required Braces or Orthoses: Spinal Brace;Other Brace(has R AFO) Spinal Brace: Applied in sitting position Other Brace: R AFO Restrictions Weight Bearing Restrictions: No    Mobility  Bed Mobility Overal bed mobility: Needs Assistance Bed Mobility: Sit to Sidelying;Rolling Rolling: Min assist       Sit to sidelying: Min assist General bed mobility comments: min A for B LE management. Verbal and tactile cues for proper sequencing and technique to adhere to precautions  Transfers Overall transfer level: Needs assistance Equipment used: Rolling walker (2 wheeled) Transfers: Sit to/from Stand Sit to Stand: Min assist         General transfer comment: min A for power up from EOB. Frequent verbal cues for proper safety and hand placement with RW.   Ambulation/Gait Ambulation/Gait assistance: Min guard Gait Distance (Feet): 300 Feet Assistive device: Rolling walker (2 wheeled) Gait Pattern/deviations: Step-through pattern;Decreased stride length;Decreased dorsiflexion - right;Trunk flexed Gait velocity: dec   General Gait Details: min guard for safety. Verbal and tactile cues for RW proximity and trunk upright.    Stairs Stairs: Yes Stairs assistance: Min guard Stair Management: One rail Right;Step to pattern;Sideways Number of Stairs: 3 General stair comments: Pt with good carryover from previous session. Requires inc time, no LOB noted   Wheelchair Mobility    Modified Rankin (Stroke Patients Only)       Balance Overall balance assessment: Needs assistance Sitting-balance support: No upper extremity supported;Feet supported Sitting balance-Leahy Scale: Fair     Standing balance support: Bilateral upper extremity supported;During functional activity Standing balance-Leahy Scale: Poor Standing balance comment: B UE support required for safe ambulation  Cognition Arousal/Alertness: Awake/alert Behavior During Therapy: WFL for tasks assessed/performed(concerned about going home bc wife cant help much ) Overall Cognitive Status: Within Functional Limits for tasks assessed                                        Exercises      General Comments General comments (skin integrity, edema, etc.): Removed AFO in bed, minimal moisture observed on lateral plantar aspect of foot, educated pt on importance of removing AFO and inspecting skin while adhering to precautions      Pertinent Vitals/Pain Pain Assessment: Faces Faces Pain Scale: Hurts little more Pain Location: back Pain Descriptors / Indicators: Discomfort;Operative site guarding Pain Intervention(s): Limited activity within patient's tolerance;Monitored during session    Home Living                      Prior Function            PT Goals (current goals can now be found in the care plan section) Acute Rehab PT Goals PT Goal Formulation: With patient Time For Goal Achievement: 08/01/19 Potential to Achieve Goals: Good Progress towards PT goals: Progressing toward goals    Frequency    Min 5X/week      PT Plan Current plan remains appropriate    Co-evaluation              AM-PAC PT "6 Clicks" Mobility   Outcome Measure  Help needed turning from your back to your side while in a flat bed without using bedrails?: A Little Help needed moving from lying on your back to sitting on the side of a flat bed without using bedrails?: A Lot Help needed moving to and from a bed to a chair (including a wheelchair)?: A Little Help needed standing up from a chair using your arms (e.g., wheelchair or bedside chair)?: A Little Help needed to walk in hospital room?: A Little Help needed climbing 3-5 steps with a railing? : A Little 6 Click Score: 17    End of Session Equipment Utilized During Treatment: Gait belt;Back brace Activity Tolerance: Patient  tolerated treatment well Patient left: in bed;with call bell/phone within reach Nurse Communication: Mobility status PT Visit Diagnosis: Unsteadiness on feet (R26.81);Difficulty in walking, not elsewhere classified (R26.2);Pain Pain - Right/Left: (back) Pain - part of body: (back)     Time: 9629-5284 PT Time Calculation (min) (ACUTE ONLY): 19 min  Charges:  $Gait Training: (P) 8-22 mins                    Vernon Ruiz, SPT Acute Rehab  [phone]   Vernon Ruiz 07/19/2019, 10:52 AM

## 2019-07-19 NOTE — Progress Notes (Signed)
Patient ID: Vernon Ruiz, male   DOB: [birth year], 69 y.o.   MRN: [id number] Vital signs are stable Patient complains of abdominal distention and has had nausea and vomited yesterday He has had mag citrate today but his bowels have not moved He is feeling worse in terms of pain today and he is slow to mobilize but he is getting around a little bit His motor function is no better on the right lower extremity but left lower extremity appears strong His incisions are clean and dry He can restart his Eliquis We will monitor in hospital today and plan discharge for tomorrow

## 2019-07-19 NOTE — Progress Notes (Signed)
PT Progress Note for Charges    07/19/19 1000  PT Visit Information  Last PT Received On 07/19/19  PT General Charges  $$ ACUTE PT VISIT 1 Visit  PT Treatments  $Gait Training 8-22 mins  Arletta Bale, DPT  Acute Rehabilitation Services Pager [phone] Office [phone]

## 2019-07-20 LAB — GLUCOSE, CAPILLARY: Glucose-Capillary: 80 mg/dL (ref 70–99)

## 2019-07-20 MED ORDER — OXYCODONE-ACETAMINOPHEN 5-325 MG PO TABS: 1.0000 | ORAL_TABLET | ORAL | 0 refills | Status: AC | PRN

## 2019-07-20 MED ORDER — METHOCARBAMOL 500 MG PO TABS: 500.0000 mg | ORAL_TABLET | Freq: Four times a day (QID) | ORAL | 3 refills | Status: DC | PRN

## 2019-07-20 NOTE — Progress Notes (Signed)
PT Progress Note for Charges    07/20/19 1100  PT Visit Information  Last PT Received On 07/20/19  PT General Charges  $$ ACUTE PT VISIT 1 Visit  PT Treatments  $Gait Training 8-22 mins  Arletta Bale, DPT  Acute Rehabilitation Services Pager [phone] Office [phone]

## 2019-07-20 NOTE — Progress Notes (Signed)
Physical Therapy Treatment Patient Details Name: Vernon Ruiz MRN: [id number] DOB: 08-Apr-1951 Today's Date: 07/20/2019    History of Present Illness Vernon Ruiz is a 69 year old individual who had a herniated nucleus pulposus at the level of L2-3 on the right side.  He had developed a foot drop that was initially rather painless however as he became progressively weaker. Pt underwent anterolateral decompression L1-2, L3-4 on 07/17/19. PMH: CAD, DM, obstructive sleep apnea PSH: AVR, s/p cervical surgery    PT Comments    Patient continues to make progress with functional mobility and activity tolerance. He required minA for initial power up from EOB with RW. He ambulated ~200 ft with min guard-supervision for safety with RW and w/o R AFO, requiring occasional VC to avoid flexed trunk while ambulating. No excessive R foot drop or LOB noted.  Pt required minA for bed mobility, needing assistance with B LE management. Reviewed back precautions and importance of walking program. Pt and pt's wife verbalized and demonstrated understanding. Recommend d/c home with family support once medically appropriate. Pt would continue to benefit from skilled physical therapy services at this time while admitted and after d/c to address the below listed limitations in order to improve overall safety and independence with functional mobility.   Follow Up Recommendations  Home health PT;Supervision/Assistance - 24 hour     Equipment Recommendations  3in1 (PT);Other (comment)(has RW at home)    Recommendations for Other Services       Precautions / Restrictions Precautions Precautions: Back;Fall Precaution Comments: Reviewed and demonsrated no BLT precautions. Pt verbalized and demo understanding Required Braces or Orthoses: Spinal Brace;Other Brace Spinal Brace: Applied in sitting position Other Brace: R AFO Restrictions Weight Bearing Restrictions: No    Mobility  Bed Mobility Overal bed mobility: Needs  Assistance Bed Mobility: Sit to Sidelying;Rolling Rolling: Supervision       Sit to sidelying: Min assist General bed mobility comments: minA for B LE management  Transfers Overall transfer level: Needs assistance Equipment used: Rolling walker (2 wheeled) Transfers: Sit to/from Stand Sit to Stand: Min assist         General transfer comment: minA for initial power up from EOB with RW  Ambulation/Gait Ambulation/Gait assistance: Min guard;Supervision Gait Distance (Feet): 200 Feet Assistive device: Rolling walker (2 wheeled) Gait Pattern/deviations: Step-through pattern;Decreased stride length;Trunk flexed Gait velocity: dec   General Gait Details: min guard-supervision for safety. VC to ambulate with trunk upright with inc hip ext.    Stairs             Wheelchair Mobility    Modified Rankin (Stroke Patients Only)       Balance Overall balance assessment: Needs assistance Sitting-balance support: No upper extremity supported;Feet supported Sitting balance-Leahy Scale: Good     Standing balance support: Bilateral upper extremity supported;During functional activity Standing balance-Leahy Scale: Poor Standing balance comment: B UE support required for safe ambulation                            Cognition Arousal/Alertness: Awake/alert Behavior During Therapy: WFL for tasks assessed/performed Overall Cognitive Status: Within Functional Limits for tasks assessed                                        Exercises Other Exercises Other Exercises: educated pt on importance of mobiltiy and limiting sitting duration  General Comments General comments (skin integrity, edema, etc.): Wife, Tonia Ghent, present throughout session      Pertinent Vitals/Pain Pain Assessment: Faces Faces Pain Scale: Hurts even more Pain Location: back Pain Descriptors / Indicators: Discomfort;Operative site guarding;Numbness Pain Intervention(s):  Monitored during session;Limited activity within patient's tolerance    Home Living                      Prior Function            PT Goals (current goals can now be found in the care plan section) Acute Rehab PT Goals Patient Stated Goal: to get home  PT Goal Formulation: With patient Time For Goal Achievement: 08/01/19 Potential to Achieve Goals: Good Progress towards PT goals: Progressing toward goals    Frequency    Min 5X/week      PT Plan Current plan remains appropriate    Co-evaluation              AM-PAC PT "6 Clicks" Mobility   Outcome Measure  Help needed turning from your back to your side while in a flat bed without using bedrails?: A Little Help needed moving from lying on your back to sitting on the side of a flat bed without using bedrails?: A Little Help needed moving to and from a bed to a chair (including a wheelchair)?: A Little Help needed standing up from a chair using your arms (e.g., wheelchair or bedside chair)?: A Little Help needed to walk in hospital room?: A Little Help needed climbing 3-5 steps with a railing? : A Little 6 Click Score: 18    End of Session Equipment Utilized During Treatment: Gait belt;Back brace Activity Tolerance: Patient tolerated treatment well Patient left: in bed;with call bell/phone within reach;with family/visitor present Nurse Communication: Mobility status PT Visit Diagnosis: Unsteadiness on feet (R26.81);Difficulty in walking, not elsewhere classified (R26.2);Pain Pain - Right/Left: (back) Pain - part of body: (back)     Time: 5701-7793 PT Time Calculation (min) (ACUTE ONLY): 12 min  Charges:  $Gait Training: 8-22 mins                     Ger Ringenberg, SPT Acute Rehab  [phone]   Aydia Maj 07/20/2019, 11:18 AM

## 2019-07-20 NOTE — Plan of Care (Signed)
Patient alert and oriented, Vernon Ruiz's well, voiding adequate amount of urine, swallowing without difficulty, c/o moderate pain and meds given prior to discharged for ride and discomfort. Patient discharged home with family. Script and discharged instructions given to patient. Patient and family stated understanding of instructions given. Patient has an appointment with Dr. Elsner 

## 2019-07-20 NOTE — Discharge Instructions (Addendum)
Wound Care Leave incision open to air. You may shower. Do not scrub directly on incision.  Do not put any creams, lotions, or ointments on incision. Activity Walk each and every day, increasing distance each day. No lifting greater than 5 lbs.  Avoid bending, arching, and twisting. No driving for 2 weeks; may ride as a passenger locally. If provided with back brace, wear when out of bed.  It is not necessary to wear in bed. Diet Resume your normal diet.  Return to Work Will be discussed at you follow up appointment. Call Your Doctor If Any of These Occur Redness, drainage, or swelling at the wound.  Temperature greater than 101 degrees. Severe pain not relieved by pain medication. Incision starts to come apart. Follow Up Appt Call today for appointment in 3 weeks [phone]) or for problems.  If you have any hardware placed in your spine, you will need an x-ray before your appointment.       Information on my medicine - ELIQUIS (apixaban)  This medication education was reviewed with me or my healthcare representative as part of my discharge preparation.   You were taking this medication prior to this hospital admission.   Why was Eliquis prescribed for you? Eliquis was prescribed for you to reduce the risk of a blood clot forming that can cause a stroke if you have a medical condition called atrial fibrillation (a type of irregular heartbeat).  What do You need to know about Eliquis ? Take your Eliquis TWICE DAILY - one tablet in the morning and one tablet in the evening with or without food. If you have difficulty swallowing the tablet whole please discuss with your pharmacist how to take the medication safely.  Take Eliquis exactly as prescribed by your doctor and DO NOT stop taking Eliquis without talking to the doctor who prescribed the medication.  Stopping may increase your risk of developing a stroke.  Refill your prescription before you run out.  After discharge,  you should have regular check-up appointments with your healthcare provider that is prescribing your Eliquis.  In the future your dose may need to be changed if your kidney function or weight changes by a significant amount or as you get older.  What do you do if you miss a dose? If you miss a dose, take it as soon as you remember on the same day and resume taking twice daily.  Do not take more than one dose of ELIQUIS at the same time to make up a missed dose.  Important Safety Information A possible side effect of Eliquis is bleeding. You should call your healthcare provider right away if you experience any of the following: ? Bleeding from an injury or your nose that does not stop. ? Unusual colored urine (red or dark brown) or unusual colored stools (red or black). ? Unusual bruising for unknown reasons. ? A serious fall or if you hit your head (even if there is no bleeding).  Some medicines may interact with Eliquis and might increase your risk of bleeding or clotting while on Eliquis. To help avoid this, consult your healthcare provider or pharmacist prior to using any new prescription or non-prescription medications, including herbals, vitamins, non-steroidal anti-inflammatory drugs (NSAIDs) and supplements.  This website has more information on Eliquis (apixaban): http://www.eliquis.com/eliquis/home

## 2019-07-23 DIAGNOSIS — M4326 Fusion of spine, lumbar region: Secondary | ICD-10-CM | POA: Diagnosis not present

## 2019-07-23 NOTE — Discharge Summary (Signed)
Physician Discharge Summary  Patient ID: Vernon Ruiz MRN: [id number] DOB/AGE: [birth year] 68 y.o.  Admit date: 07/17/2019 Discharge date: 07/20/2019 Admission Diagnoses: Lumbar stenosis with neurogenic claudication and lumbar radiculopathy L2-3 and L3-4.  Diabetes mellitus with poor control  Discharge Diagnoses: Lumbar stenosis with neurogenic claudication and lumbar radiculopathy L2-3 and L3-4.  Diabetes mellitus with poor control Active Problems:   Lumbar radiculopathy   Discharged Condition: fair  Hospital Course: Patient was admitted to undergo surgical decompression arthrodesis using an anterolateral technique at L2-3 and L3-4.  He tolerated surgery well however he was weak postoperatively secondary to preoperative weakness in his lower extremities and required extensive physical therapy while in the hospital.  He did improved to the point where he could ambulate with the use of a walker and is discharged home to undergo further home health physical therapy.  His incisions have been clean and dry.  He has a foot drop on the right side.  During hospitalization it was apparent that his diabetes was under poor control with high A1c's and he required significant treatment of this via the help of the diabetic coordinator.  Consults: Diabetic coordinator  Significant Diagnostic Studies: Multiple blood sugars for glycemic control  Treatments: surgery: Anterolateral decompression L2-3 and L3-4 lateral plate fixation V3-6 and L3-4.  Discharge Exam: Blood pressure (!) 155/72, pulse 64, temperature 97.9 F (36.6 C), temperature source Oral, resp. rate 18, height 5\' 5"  (1.651 m), weight 89.2 kg, SpO2 95 %. Incision is clean and dry motor function is intact save for right foot drop.  Disposition: Discharge disposition: 01-Home or Self Care       Discharge Instructions    Call MD for:  redness, tenderness, or signs of infection (pain, swelling, redness, odor or green/yellow discharge around  incision site)   Complete by: As directed    Call MD for:  severe uncontrolled pain   Complete by: As directed    Call MD for:  temperature >100.4   Complete by: As directed    Diet - low sodium heart healthy   Complete by: As directed    Discharge instructions   Complete by: As directed    Okay to shower. Do not apply salves or appointments to incision. No heavy lifting with the upper extremities greater than 15 pounds.   Incentive spirometry RT   Complete by: As directed    Increase activity slowly   Complete by: As directed      Allergies as of 07/20/2019      Reactions   Penicillins Itching, Rash, Other (See Comments)   All over body Did it involve swelling of the face/tongue/throat, SOB, or low BP? No Did it involve sudden or severe rash/hives, skin peeling, or any reaction on the inside of your mouth or nose? Yes Did you need to seek medical attention at a hospital or doctor's office? No When did it last happen?20 + years If all above answers are "NO", may proceed with cephalosporin use.   Sulfa Antibiotics Rash   Rash      Medication List    STOP taking these medications   enoxaparin 120 MG/0.8ML injection Commonly known as: Lovenox     TAKE these medications   acetaminophen 325 MG tablet Commonly known as: TYLENOL Take 2 tablets (650 mg total) by mouth every 6 (six) hours as needed for mild pain.   amLODipine 5 MG tablet Commonly known as: NORVASC Take 5 mg by mouth daily.   B-complex with vitamin C tablet  Take 1 tablet by mouth 3 (three) times a week.   Eliquis 5 MG Tabs tablet Generic drug: apixaban Take 5 mg by mouth 2 (two) times daily.   famotidine 20 MG tablet Commonly known as: PEPCID Take 1 tablet (20 mg total) by mouth 2 (two) times daily for 15 days. What changed:   when to take this  reasons to take this   furosemide 20 MG tablet Commonly known as: LASIX Take one tablet by mouth daily only as needed for swelling What changed:    how much to take  how to take this  when to take this  reasons to take this  additional instructions   glimepiride 4 MG tablet Commonly known as: AMARYL Take 4 mg by mouth 2 (two) times daily.   Jardiance 25 MG Tabs tablet Generic drug: empagliflozin Take 12.5 mg by mouth daily.   losartan 100 MG tablet Commonly known as: COZAAR Take 100 mg by mouth daily.   meclizine 25 MG tablet Commonly known as: ANTIVERT Take 25 mg by mouth 2 (two) times daily as needed (dizziness/vertigo).   metFORMIN 500 MG 24 hr tablet Commonly known as: GLUCOPHAGE-XR Take 1,000 mg by mouth 2 (two) times daily.   methocarbamol 500 MG tablet Commonly known as: ROBAXIN Take 1 tablet (500 mg total) by mouth every 6 (six) hours as needed for muscle spasms.   metoprolol tartrate 25 MG tablet Commonly known as: LOPRESSOR Take 1 tablet (25 mg total) by mouth 2 (two) times daily.   omeprazole 20 MG capsule Commonly known as: PRILOSEC Take 20 mg by mouth 2 (two) times daily as needed.   oxyCODONE-acetaminophen 5-325 MG tablet Commonly known as: PERCOCET/ROXICET Take 1-2 tablets by mouth every 3 (three) hours as needed for moderate pain or severe pain.   rosuvastatin 20 MG tablet Commonly known as: CRESTOR Take 1 tablet (20 mg total) by mouth daily.   Toujeo SoloStar 300 UNIT/ML Solostar Pen Generic drug: insulin glargine (1 Unit Dial) Inject 35 Units into the skin daily.   Trulicity 1.5 MG/0.5ML Sopn Generic drug: Dulaglutide Inject 1.5 mg into the skin every Sunday.   vitamin C 500 MG tablet Commonly known as: ASCORBIC ACID Take 1,500 mg by mouth 3 (three) times a week.   Vitamin D3 250 MCG (10000 UT) capsule Take 10,000 Units by mouth 3 (three) times a week.   zinc gluconate 50 MG tablet Take 50 mg by mouth daily.      Follow-up Information    [name], MD Follow up.   Specialty: Neurosurgery Contact information: [address] [place]  [address] [phone]        [facility] Follow up.   Specialty: Home Health Services Why: Your home health services have been set up through [facility]. The agency will contact you to set up your services. If you have any questions please call the number provided above. Contact information: [address] [place] [address] [phone]           Signed: Stefani Dama 07/23/2019, 8:03 AM

## 2019-07-31 DIAGNOSIS — M48062 Spinal stenosis, lumbar region with neurogenic claudication: Secondary | ICD-10-CM | POA: Diagnosis not present

## 2019-08-01 DIAGNOSIS — M48062 Spinal stenosis, lumbar region with neurogenic claudication: Secondary | ICD-10-CM | POA: Diagnosis not present

## 2019-08-01 DIAGNOSIS — M5126 Other intervertebral disc displacement, lumbar region: Secondary | ICD-10-CM | POA: Diagnosis not present

## 2019-08-01 DIAGNOSIS — M48061 Spinal stenosis, lumbar region without neurogenic claudication: Secondary | ICD-10-CM | POA: Diagnosis not present

## 2019-08-10 DIAGNOSIS — I480 Paroxysmal atrial fibrillation: Secondary | ICD-10-CM | POA: Diagnosis not present

## 2019-08-10 DIAGNOSIS — M47816 Spondylosis without myelopathy or radiculopathy, lumbar region: Secondary | ICD-10-CM | POA: Diagnosis not present

## 2019-08-10 DIAGNOSIS — I10 Essential (primary) hypertension: Secondary | ICD-10-CM | POA: Diagnosis not present

## 2019-08-10 DIAGNOSIS — E1169 Type 2 diabetes mellitus with other specified complication: Secondary | ICD-10-CM | POA: Diagnosis not present

## 2019-08-10 DIAGNOSIS — E785 Hyperlipidemia, unspecified: Secondary | ICD-10-CM | POA: Diagnosis not present

## 2019-08-10 DIAGNOSIS — I4819 Other persistent atrial fibrillation: Secondary | ICD-10-CM | POA: Diagnosis not present

## 2019-08-22 DIAGNOSIS — M48062 Spinal stenosis, lumbar region with neurogenic claudication: Secondary | ICD-10-CM | POA: Diagnosis not present

## 2019-08-22 DIAGNOSIS — Z981 Arthrodesis status: Secondary | ICD-10-CM | POA: Diagnosis not present

## 2019-08-22 DIAGNOSIS — Z4789 Encounter for other orthopedic aftercare: Secondary | ICD-10-CM | POA: Diagnosis not present

## 2019-08-23 DIAGNOSIS — I10 Essential (primary) hypertension: Secondary | ICD-10-CM | POA: Diagnosis not present

## 2019-08-23 DIAGNOSIS — B351 Tinea unguium: Secondary | ICD-10-CM | POA: Diagnosis not present

## 2019-08-23 DIAGNOSIS — R609 Edema, unspecified: Secondary | ICD-10-CM | POA: Diagnosis not present

## 2019-08-23 DIAGNOSIS — R899 Unspecified abnormal finding in specimens from other organs, systems and tissues: Secondary | ICD-10-CM | POA: Diagnosis not present

## 2019-09-05 DIAGNOSIS — I10 Essential (primary) hypertension: Secondary | ICD-10-CM | POA: Diagnosis not present

## 2019-09-05 DIAGNOSIS — M47816 Spondylosis without myelopathy or radiculopathy, lumbar region: Secondary | ICD-10-CM | POA: Diagnosis not present

## 2019-09-05 DIAGNOSIS — I4819 Other persistent atrial fibrillation: Secondary | ICD-10-CM | POA: Diagnosis not present

## 2019-09-05 DIAGNOSIS — E1169 Type 2 diabetes mellitus with other specified complication: Secondary | ICD-10-CM | POA: Diagnosis not present

## 2019-09-05 DIAGNOSIS — I480 Paroxysmal atrial fibrillation: Secondary | ICD-10-CM | POA: Diagnosis not present

## 2019-09-05 DIAGNOSIS — E785 Hyperlipidemia, unspecified: Secondary | ICD-10-CM | POA: Diagnosis not present

## 2019-09-21 DIAGNOSIS — I4819 Other persistent atrial fibrillation: Secondary | ICD-10-CM | POA: Diagnosis not present

## 2019-09-21 DIAGNOSIS — Z4789 Encounter for other orthopedic aftercare: Secondary | ICD-10-CM | POA: Diagnosis not present

## 2019-09-21 DIAGNOSIS — Z7901 Long term (current) use of anticoagulants: Secondary | ICD-10-CM | POA: Diagnosis not present

## 2019-09-21 DIAGNOSIS — K219 Gastro-esophageal reflux disease without esophagitis: Secondary | ICD-10-CM | POA: Diagnosis not present

## 2019-09-21 DIAGNOSIS — Z79899 Other long term (current) drug therapy: Secondary | ICD-10-CM | POA: Diagnosis not present

## 2019-09-21 DIAGNOSIS — M4726 Other spondylosis with radiculopathy, lumbar region: Secondary | ICD-10-CM | POA: Diagnosis not present

## 2019-09-21 DIAGNOSIS — M48062 Spinal stenosis, lumbar region with neurogenic claudication: Secondary | ICD-10-CM | POA: Diagnosis not present

## 2019-09-21 DIAGNOSIS — I10 Essential (primary) hypertension: Secondary | ICD-10-CM | POA: Diagnosis not present

## 2019-09-21 DIAGNOSIS — Z981 Arthrodesis status: Secondary | ICD-10-CM | POA: Diagnosis not present

## 2019-09-21 DIAGNOSIS — Z794 Long term (current) use of insulin: Secondary | ICD-10-CM | POA: Diagnosis not present

## 2019-09-21 DIAGNOSIS — G4733 Obstructive sleep apnea (adult) (pediatric): Secondary | ICD-10-CM | POA: Diagnosis not present

## 2019-09-21 DIAGNOSIS — Z952 Presence of prosthetic heart valve: Secondary | ICD-10-CM | POA: Diagnosis not present

## 2019-09-21 DIAGNOSIS — Z8673 Personal history of transient ischemic attack (TIA), and cerebral infarction without residual deficits: Secondary | ICD-10-CM | POA: Diagnosis not present

## 2019-09-21 DIAGNOSIS — F17291 Nicotine dependence, other tobacco product, in remission: Secondary | ICD-10-CM | POA: Diagnosis not present

## 2019-09-21 DIAGNOSIS — M21371 Foot drop, right foot: Secondary | ICD-10-CM | POA: Diagnosis not present

## 2019-09-21 DIAGNOSIS — I2510 Atherosclerotic heart disease of native coronary artery without angina pectoris: Secondary | ICD-10-CM | POA: Diagnosis not present

## 2019-09-21 DIAGNOSIS — R29898 Other symptoms and signs involving the musculoskeletal system: Secondary | ICD-10-CM | POA: Diagnosis not present

## 2019-09-21 DIAGNOSIS — E1165 Type 2 diabetes mellitus with hyperglycemia: Secondary | ICD-10-CM | POA: Diagnosis not present

## 2019-09-24 ENCOUNTER — Other Ambulatory Visit: Payer: Self-pay

## 2019-09-24 ENCOUNTER — Ambulatory Visit (INDEPENDENT_AMBULATORY_CARE_PROVIDER_SITE_OTHER): Payer: Medicare Other | Admitting: Podiatrist

## 2019-09-24 ENCOUNTER — Encounter: Payer: Self-pay | Admitting: Podiatrist

## 2019-09-24 VITALS — Temp 96.8°F

## 2019-09-24 DIAGNOSIS — E1169 Type 2 diabetes mellitus with other specified complication: Secondary | ICD-10-CM | POA: Diagnosis not present

## 2019-09-24 DIAGNOSIS — E1151 Type 2 diabetes mellitus with diabetic peripheral angiopathy without gangrene: Secondary | ICD-10-CM | POA: Diagnosis not present

## 2019-09-24 DIAGNOSIS — B351 Tinea unguium: Secondary | ICD-10-CM

## 2019-09-25 DIAGNOSIS — M48062 Spinal stenosis, lumbar region with neurogenic claudication: Secondary | ICD-10-CM | POA: Diagnosis not present

## 2019-09-25 DIAGNOSIS — Z981 Arthrodesis status: Secondary | ICD-10-CM | POA: Diagnosis not present

## 2019-09-25 DIAGNOSIS — M4726 Other spondylosis with radiculopathy, lumbar region: Secondary | ICD-10-CM | POA: Diagnosis not present

## 2019-09-25 DIAGNOSIS — M21371 Foot drop, right foot: Secondary | ICD-10-CM | POA: Diagnosis not present

## 2019-09-25 DIAGNOSIS — E1165 Type 2 diabetes mellitus with hyperglycemia: Secondary | ICD-10-CM | POA: Diagnosis not present

## 2019-09-25 DIAGNOSIS — Z4789 Encounter for other orthopedic aftercare: Secondary | ICD-10-CM | POA: Diagnosis not present

## 2019-09-25 NOTE — Progress Notes (Signed)
  Chief Complaint  Patient presents with  . Nail Problem    Thick, long toenails - bilateral, 1-5.   . Diabetes    Most recent HgbA1c = 7.7 in April 2021. No history of neuropathy or ulcers per pt.     HPI: Patient is 70 y.o. male who presents today for the above mentioned complaints.  He is diabetic.  He relates swelling on the right side and has had a stroke which caused foot drop on the right  He has difficulty trimming his toenails on his own due to back problems as well. He is on Eliquis.   Review of Systems  DATA OBTAINED: from patient  GENERAL: Feels well no fevers, no fatigue, no changes in appetite SKIN: No itching, no rashes, no open wounds EYES: No eye pain,no redness, no discharge EARS: No earache,no ringing of ears, NOSE: No congestion, no drainage, no bleeding  MOUTH/THROAT: No mouth pain, No sore throat, No difficulty chewing or swallowing  RESPIRATORY: No cough, no wheezing, no SOB CARDIAC: No chest pain,no heart palpitations, GI: No abdominal pain, No Nausea, no vomiting, no diarrhea, no heartburn or no reflux  GU: No dysuria, no increased frequency or urgency MUSCULOSKELETAL: back issues of which he has had surgery. Right foot drop NEUROLOGIC: Awake, alert, appropriate to situation, No change in mental status. PSYCHIATRIC: No overt anxiety or sadness.No behavior issue.      Physical Exam  GENERAL APPEARANCE: Alert, conversant. Appropriately groomed. No acute distress.   VASCULAR: Pedal pulses palpable DP and PT bilateral.  Capillary refill time is immediate to all digits,  Proximal to distal cooling it warm to warm.  Swelling noted on the right lower leg and foot in comparison to the left.   NEUROLOGIC: sensation is intact to 5.07 monofilament at 5/5 sites bilateral.  Light touch is intact bilateral, vibratory sensation intact bilateral, achilles tendon reflex is intact bilateral.   MUSCULOSKELETAL: acceptable muscle strength, tone and stability bilateral.   Intrinsic muscluature intact bilateral.  Range of motion at ankle and first MPJ is normal bilateral.   DERMATOLOGIC: skin is warm, supple, and dry.  No open lesions noted.  No interdigital maceration noted bilateral.  Digital nails are elongated discolored, dystrophic, brittle x 10.      Assessment     ICD-10-CM   1. Onychomycosis of multiple toenails with type 2 diabetes mellitus and peripheral angiopathy (HCC)  E11.51    E11.69    B35.1      Plan Consent was obtained for treatment procedures.   Mechanical debridement of nails 1-5  bilaterally performed with a nail nipper.  Filed with dremel without incident.    Return office visit  In 3 months for periodic foot care and evaluation due to potential at risk complications.

## 2019-09-28 DIAGNOSIS — M21371 Foot drop, right foot: Secondary | ICD-10-CM | POA: Diagnosis not present

## 2019-09-28 DIAGNOSIS — Z4789 Encounter for other orthopedic aftercare: Secondary | ICD-10-CM | POA: Diagnosis not present

## 2019-09-28 DIAGNOSIS — E1165 Type 2 diabetes mellitus with hyperglycemia: Secondary | ICD-10-CM | POA: Diagnosis not present

## 2019-09-28 DIAGNOSIS — M48062 Spinal stenosis, lumbar region with neurogenic claudication: Secondary | ICD-10-CM | POA: Diagnosis not present

## 2019-09-28 DIAGNOSIS — Z981 Arthrodesis status: Secondary | ICD-10-CM | POA: Diagnosis not present

## 2019-09-28 DIAGNOSIS — M4726 Other spondylosis with radiculopathy, lumbar region: Secondary | ICD-10-CM | POA: Diagnosis not present

## 2019-10-02 DIAGNOSIS — E1165 Type 2 diabetes mellitus with hyperglycemia: Secondary | ICD-10-CM | POA: Diagnosis not present

## 2019-10-02 DIAGNOSIS — Z981 Arthrodesis status: Secondary | ICD-10-CM | POA: Diagnosis not present

## 2019-10-02 DIAGNOSIS — M4726 Other spondylosis with radiculopathy, lumbar region: Secondary | ICD-10-CM | POA: Diagnosis not present

## 2019-10-02 DIAGNOSIS — M21371 Foot drop, right foot: Secondary | ICD-10-CM | POA: Diagnosis not present

## 2019-10-02 DIAGNOSIS — M48062 Spinal stenosis, lumbar region with neurogenic claudication: Secondary | ICD-10-CM | POA: Diagnosis not present

## 2019-10-02 DIAGNOSIS — Z4789 Encounter for other orthopedic aftercare: Secondary | ICD-10-CM | POA: Diagnosis not present

## 2019-10-04 DIAGNOSIS — M21371 Foot drop, right foot: Secondary | ICD-10-CM | POA: Diagnosis not present

## 2019-10-04 DIAGNOSIS — M48062 Spinal stenosis, lumbar region with neurogenic claudication: Secondary | ICD-10-CM | POA: Diagnosis not present

## 2019-10-04 DIAGNOSIS — E1165 Type 2 diabetes mellitus with hyperglycemia: Secondary | ICD-10-CM | POA: Diagnosis not present

## 2019-10-04 DIAGNOSIS — M4726 Other spondylosis with radiculopathy, lumbar region: Secondary | ICD-10-CM | POA: Diagnosis not present

## 2019-10-04 DIAGNOSIS — Z981 Arthrodesis status: Secondary | ICD-10-CM | POA: Diagnosis not present

## 2019-10-04 DIAGNOSIS — Z4789 Encounter for other orthopedic aftercare: Secondary | ICD-10-CM | POA: Diagnosis not present

## 2019-10-08 DIAGNOSIS — M21371 Foot drop, right foot: Secondary | ICD-10-CM | POA: Diagnosis not present

## 2019-10-08 DIAGNOSIS — M48062 Spinal stenosis, lumbar region with neurogenic claudication: Secondary | ICD-10-CM | POA: Diagnosis not present

## 2019-10-08 DIAGNOSIS — E1165 Type 2 diabetes mellitus with hyperglycemia: Secondary | ICD-10-CM | POA: Diagnosis not present

## 2019-10-08 DIAGNOSIS — M4726 Other spondylosis with radiculopathy, lumbar region: Secondary | ICD-10-CM | POA: Diagnosis not present

## 2019-10-08 DIAGNOSIS — Z981 Arthrodesis status: Secondary | ICD-10-CM | POA: Diagnosis not present

## 2019-10-08 DIAGNOSIS — Z4789 Encounter for other orthopedic aftercare: Secondary | ICD-10-CM | POA: Diagnosis not present

## 2019-10-11 DIAGNOSIS — M4726 Other spondylosis with radiculopathy, lumbar region: Secondary | ICD-10-CM | POA: Diagnosis not present

## 2019-10-11 DIAGNOSIS — Z981 Arthrodesis status: Secondary | ICD-10-CM | POA: Diagnosis not present

## 2019-10-11 DIAGNOSIS — M21371 Foot drop, right foot: Secondary | ICD-10-CM | POA: Diagnosis not present

## 2019-10-11 DIAGNOSIS — M48062 Spinal stenosis, lumbar region with neurogenic claudication: Secondary | ICD-10-CM | POA: Diagnosis not present

## 2019-10-11 DIAGNOSIS — E1165 Type 2 diabetes mellitus with hyperglycemia: Secondary | ICD-10-CM | POA: Diagnosis not present

## 2019-10-11 DIAGNOSIS — Z4789 Encounter for other orthopedic aftercare: Secondary | ICD-10-CM | POA: Diagnosis not present

## 2019-10-16 DIAGNOSIS — Z981 Arthrodesis status: Secondary | ICD-10-CM | POA: Diagnosis not present

## 2019-10-16 DIAGNOSIS — Z4789 Encounter for other orthopedic aftercare: Secondary | ICD-10-CM | POA: Diagnosis not present

## 2019-10-16 DIAGNOSIS — M21371 Foot drop, right foot: Secondary | ICD-10-CM | POA: Diagnosis not present

## 2019-10-16 DIAGNOSIS — M4726 Other spondylosis with radiculopathy, lumbar region: Secondary | ICD-10-CM | POA: Diagnosis not present

## 2019-10-16 DIAGNOSIS — E1165 Type 2 diabetes mellitus with hyperglycemia: Secondary | ICD-10-CM | POA: Diagnosis not present

## 2019-10-16 DIAGNOSIS — M48062 Spinal stenosis, lumbar region with neurogenic claudication: Secondary | ICD-10-CM | POA: Diagnosis not present

## 2019-10-19 ENCOUNTER — Other Ambulatory Visit: Payer: Self-pay | Admitting: Neurological Surgery

## 2019-10-19 DIAGNOSIS — M48062 Spinal stenosis, lumbar region with neurogenic claudication: Secondary | ICD-10-CM

## 2019-10-21 DIAGNOSIS — Z794 Long term (current) use of insulin: Secondary | ICD-10-CM | POA: Diagnosis not present

## 2019-10-21 DIAGNOSIS — I4819 Other persistent atrial fibrillation: Secondary | ICD-10-CM | POA: Diagnosis not present

## 2019-10-21 DIAGNOSIS — F17291 Nicotine dependence, other tobacco product, in remission: Secondary | ICD-10-CM | POA: Diagnosis not present

## 2019-10-21 DIAGNOSIS — G4733 Obstructive sleep apnea (adult) (pediatric): Secondary | ICD-10-CM | POA: Diagnosis not present

## 2019-10-21 DIAGNOSIS — Z8673 Personal history of transient ischemic attack (TIA), and cerebral infarction without residual deficits: Secondary | ICD-10-CM | POA: Diagnosis not present

## 2019-10-21 DIAGNOSIS — Z7901 Long term (current) use of anticoagulants: Secondary | ICD-10-CM | POA: Diagnosis not present

## 2019-10-21 DIAGNOSIS — M4726 Other spondylosis with radiculopathy, lumbar region: Secondary | ICD-10-CM | POA: Diagnosis not present

## 2019-10-21 DIAGNOSIS — K219 Gastro-esophageal reflux disease without esophagitis: Secondary | ICD-10-CM | POA: Diagnosis not present

## 2019-10-21 DIAGNOSIS — Z79899 Other long term (current) drug therapy: Secondary | ICD-10-CM | POA: Diagnosis not present

## 2019-10-21 DIAGNOSIS — M21371 Foot drop, right foot: Secondary | ICD-10-CM | POA: Diagnosis not present

## 2019-10-21 DIAGNOSIS — Z952 Presence of prosthetic heart valve: Secondary | ICD-10-CM | POA: Diagnosis not present

## 2019-10-21 DIAGNOSIS — I10 Essential (primary) hypertension: Secondary | ICD-10-CM | POA: Diagnosis not present

## 2019-10-21 DIAGNOSIS — Z981 Arthrodesis status: Secondary | ICD-10-CM | POA: Diagnosis not present

## 2019-10-21 DIAGNOSIS — R29898 Other symptoms and signs involving the musculoskeletal system: Secondary | ICD-10-CM | POA: Diagnosis not present

## 2019-10-21 DIAGNOSIS — Z4789 Encounter for other orthopedic aftercare: Secondary | ICD-10-CM | POA: Diagnosis not present

## 2019-10-21 DIAGNOSIS — M48062 Spinal stenosis, lumbar region with neurogenic claudication: Secondary | ICD-10-CM | POA: Diagnosis not present

## 2019-10-21 DIAGNOSIS — E1165 Type 2 diabetes mellitus with hyperglycemia: Secondary | ICD-10-CM | POA: Diagnosis not present

## 2019-10-21 DIAGNOSIS — I2510 Atherosclerotic heart disease of native coronary artery without angina pectoris: Secondary | ICD-10-CM | POA: Diagnosis not present

## 2019-10-22 DIAGNOSIS — M4726 Other spondylosis with radiculopathy, lumbar region: Secondary | ICD-10-CM | POA: Diagnosis not present

## 2019-10-22 DIAGNOSIS — Z981 Arthrodesis status: Secondary | ICD-10-CM | POA: Diagnosis not present

## 2019-10-22 DIAGNOSIS — E1165 Type 2 diabetes mellitus with hyperglycemia: Secondary | ICD-10-CM | POA: Diagnosis not present

## 2019-10-22 DIAGNOSIS — M21371 Foot drop, right foot: Secondary | ICD-10-CM | POA: Diagnosis not present

## 2019-10-22 DIAGNOSIS — Z4789 Encounter for other orthopedic aftercare: Secondary | ICD-10-CM | POA: Diagnosis not present

## 2019-10-22 DIAGNOSIS — M48062 Spinal stenosis, lumbar region with neurogenic claudication: Secondary | ICD-10-CM | POA: Diagnosis not present

## 2019-11-01 DIAGNOSIS — M48062 Spinal stenosis, lumbar region with neurogenic claudication: Secondary | ICD-10-CM | POA: Diagnosis not present

## 2019-11-01 DIAGNOSIS — Z4789 Encounter for other orthopedic aftercare: Secondary | ICD-10-CM | POA: Diagnosis not present

## 2019-11-01 DIAGNOSIS — E1165 Type 2 diabetes mellitus with hyperglycemia: Secondary | ICD-10-CM | POA: Diagnosis not present

## 2019-11-01 DIAGNOSIS — M4726 Other spondylosis with radiculopathy, lumbar region: Secondary | ICD-10-CM | POA: Diagnosis not present

## 2019-11-01 DIAGNOSIS — M21371 Foot drop, right foot: Secondary | ICD-10-CM | POA: Diagnosis not present

## 2019-11-01 DIAGNOSIS — Z981 Arthrodesis status: Secondary | ICD-10-CM | POA: Diagnosis not present

## 2019-11-05 DIAGNOSIS — I482 Chronic atrial fibrillation, unspecified: Secondary | ICD-10-CM | POA: Diagnosis not present

## 2019-11-05 DIAGNOSIS — M47816 Spondylosis without myelopathy or radiculopathy, lumbar region: Secondary | ICD-10-CM | POA: Diagnosis not present

## 2019-11-05 DIAGNOSIS — E1169 Type 2 diabetes mellitus with other specified complication: Secondary | ICD-10-CM | POA: Diagnosis not present

## 2019-11-05 DIAGNOSIS — I10 Essential (primary) hypertension: Secondary | ICD-10-CM | POA: Diagnosis not present

## 2019-11-05 DIAGNOSIS — E785 Hyperlipidemia, unspecified: Secondary | ICD-10-CM | POA: Diagnosis not present

## 2019-11-05 DIAGNOSIS — I480 Paroxysmal atrial fibrillation: Secondary | ICD-10-CM | POA: Diagnosis not present

## 2019-11-09 DIAGNOSIS — M4726 Other spondylosis with radiculopathy, lumbar region: Secondary | ICD-10-CM | POA: Diagnosis not present

## 2019-11-09 DIAGNOSIS — M48062 Spinal stenosis, lumbar region with neurogenic claudication: Secondary | ICD-10-CM | POA: Diagnosis not present

## 2019-11-09 DIAGNOSIS — Z981 Arthrodesis status: Secondary | ICD-10-CM | POA: Diagnosis not present

## 2019-11-09 DIAGNOSIS — Z4789 Encounter for other orthopedic aftercare: Secondary | ICD-10-CM | POA: Diagnosis not present

## 2019-11-09 DIAGNOSIS — M21371 Foot drop, right foot: Secondary | ICD-10-CM | POA: Diagnosis not present

## 2019-11-09 DIAGNOSIS — E1165 Type 2 diabetes mellitus with hyperglycemia: Secondary | ICD-10-CM | POA: Diagnosis not present

## 2019-11-26 DIAGNOSIS — M5417 Radiculopathy, lumbosacral region: Secondary | ICD-10-CM | POA: Diagnosis not present

## 2019-11-26 DIAGNOSIS — M48062 Spinal stenosis, lumbar region with neurogenic claudication: Secondary | ICD-10-CM | POA: Diagnosis not present

## 2019-11-28 ENCOUNTER — Ambulatory Visit
Admission: RE | Admit: 2019-11-28 | Discharge: 2019-11-28 | Disposition: A | Discharge status: Home / Self Care | Payer: Medicare Other | Source: Ambulatory Visit | Attending: Neurological Surgery | Admitting: Neurological Surgery

## 2019-11-28 ENCOUNTER — Other Ambulatory Visit: Payer: Self-pay

## 2019-11-28 DIAGNOSIS — M5417 Radiculopathy, lumbosacral region: Secondary | ICD-10-CM | POA: Diagnosis not present

## 2019-11-28 DIAGNOSIS — M48062 Spinal stenosis, lumbar region with neurogenic claudication: Secondary | ICD-10-CM

## 2019-11-28 DIAGNOSIS — M545 Low back pain: Secondary | ICD-10-CM | POA: Diagnosis not present

## 2019-12-03 DIAGNOSIS — M48062 Spinal stenosis, lumbar region with neurogenic claudication: Secondary | ICD-10-CM | POA: Diagnosis not present

## 2019-12-03 DIAGNOSIS — M5417 Radiculopathy, lumbosacral region: Secondary | ICD-10-CM | POA: Diagnosis not present

## 2019-12-05 DIAGNOSIS — M48062 Spinal stenosis, lumbar region with neurogenic claudication: Secondary | ICD-10-CM | POA: Diagnosis not present

## 2019-12-05 DIAGNOSIS — M5417 Radiculopathy, lumbosacral region: Secondary | ICD-10-CM | POA: Diagnosis not present

## 2019-12-10 DIAGNOSIS — M48062 Spinal stenosis, lumbar region with neurogenic claudication: Secondary | ICD-10-CM | POA: Diagnosis not present

## 2019-12-10 DIAGNOSIS — M5417 Radiculopathy, lumbosacral region: Secondary | ICD-10-CM | POA: Diagnosis not present

## 2019-12-12 DIAGNOSIS — M5417 Radiculopathy, lumbosacral region: Secondary | ICD-10-CM | POA: Diagnosis not present

## 2019-12-12 DIAGNOSIS — M48062 Spinal stenosis, lumbar region with neurogenic claudication: Secondary | ICD-10-CM | POA: Diagnosis not present

## 2019-12-14 DIAGNOSIS — M5417 Radiculopathy, lumbosacral region: Secondary | ICD-10-CM | POA: Diagnosis not present

## 2019-12-14 DIAGNOSIS — M48062 Spinal stenosis, lumbar region with neurogenic claudication: Secondary | ICD-10-CM | POA: Diagnosis not present

## 2019-12-19 DIAGNOSIS — M5417 Radiculopathy, lumbosacral region: Secondary | ICD-10-CM | POA: Diagnosis not present

## 2019-12-19 DIAGNOSIS — M48062 Spinal stenosis, lumbar region with neurogenic claudication: Secondary | ICD-10-CM | POA: Diagnosis not present

## 2019-12-21 DIAGNOSIS — M5417 Radiculopathy, lumbosacral region: Secondary | ICD-10-CM | POA: Diagnosis not present

## 2019-12-21 DIAGNOSIS — M48062 Spinal stenosis, lumbar region with neurogenic claudication: Secondary | ICD-10-CM | POA: Diagnosis not present

## 2019-12-22 DIAGNOSIS — Z23 Encounter for immunization: Secondary | ICD-10-CM | POA: Diagnosis not present

## 2019-12-24 DIAGNOSIS — M48062 Spinal stenosis, lumbar region with neurogenic claudication: Secondary | ICD-10-CM | POA: Diagnosis not present

## 2019-12-24 DIAGNOSIS — M5417 Radiculopathy, lumbosacral region: Secondary | ICD-10-CM | POA: Diagnosis not present

## 2019-12-25 ENCOUNTER — Telehealth: Payer: Self-pay | Admitting: *Deleted

## 2019-12-25 NOTE — Telephone Encounter (Signed)
   [facility] Pre-operative Risk Assessment    HEARTCARE STAFF: - Please ensure there is not already an duplicate clearance open for this procedure. - Under Visit Info/Reason for Call, type in Other and utilize the format Clearance MM/DD/YY or Clearance TBD. Do not use dashes or single digits. - If request is for dental extraction, please clarify the # of teeth to be extracted.  Request for surgical clearance:  1. What type of surgery is being performed? L3-4 Translaminar epidural steroid injection   2. When is this surgery scheduled? TBD   3. What type of clearance is required (medical clearance vs. Pharmacy clearance to hold med vs. Both)? Both  4. Are there any medications that need to be held prior to surgery and how long?Eliquis 3 days prior   5. Practice name and name of physician performing surgery? [facility]; Dr Kristeen Miss   6. What is the office phone number? [phone]    7.   What is the office fax number? [phone]  8.   Anesthesia type (None, local, MAC, general) ? IV Sedation   Zedrick Springsteen L 12/25/2019, 5:20 PM  _________________________________________________________________   (provider comments below)

## 2019-12-25 NOTE — Telephone Encounter (Signed)
Clinical pharmacist to review Eliquis 

## 2019-12-26 ENCOUNTER — Ambulatory Visit (INDEPENDENT_AMBULATORY_CARE_PROVIDER_SITE_OTHER): Payer: Medicare Other | Admitting: Podiatry

## 2019-12-26 ENCOUNTER — Encounter: Payer: Self-pay | Admitting: Podiatry

## 2019-12-26 ENCOUNTER — Other Ambulatory Visit: Payer: Self-pay

## 2019-12-26 DIAGNOSIS — M5417 Radiculopathy, lumbosacral region: Secondary | ICD-10-CM | POA: Diagnosis not present

## 2019-12-26 DIAGNOSIS — E1169 Type 2 diabetes mellitus with other specified complication: Secondary | ICD-10-CM | POA: Diagnosis not present

## 2019-12-26 DIAGNOSIS — D689 Coagulation defect, unspecified: Secondary | ICD-10-CM | POA: Diagnosis not present

## 2019-12-26 DIAGNOSIS — M48062 Spinal stenosis, lumbar region with neurogenic claudication: Secondary | ICD-10-CM | POA: Diagnosis not present

## 2019-12-26 DIAGNOSIS — E1151 Type 2 diabetes mellitus with diabetic peripheral angiopathy without gangrene: Secondary | ICD-10-CM | POA: Diagnosis not present

## 2019-12-26 DIAGNOSIS — B351 Tinea unguium: Secondary | ICD-10-CM | POA: Diagnosis not present

## 2019-12-26 MED ORDER — APIXABAN 5 MG PO TABS: 5.0000 mg | ORAL_TABLET | Freq: Two times a day (BID) | ORAL | 5 refills | Status: DC

## 2019-12-26 NOTE — Telephone Encounter (Signed)
Patient with diagnosis of afib on Eliquis for anticoagulation.    Procedure: L3-4 Translaminar epidural steroid injection Date of procedure: TBD  CHADS2-VASc score of 6 (age, HTN, DM, CAD, CVA in Sept 2020). Renal function is normal.  Pt will need to hold Eliquis for 3 days prior to procedure and will again require bridging with Lovenox as he has had for multiple back injections earlier this year.  Please advise pt to call clinic once his procedure date is set so that we can coordinate bridging. He is familiar with Lovenox and prior instructions have been given over the phone.

## 2019-12-26 NOTE — Progress Notes (Signed)
This patient returns to my office for at risk foot care.  This patient requires this care by a professional since this patient will be at risk due to having coagulation defect due to eliquis.  This patient is unable to cut nails himself since the patient cannot reach his nails.These nails are painful walking and wearing shoes.  This patient presents for at risk foot care today.  General Appearance  Alert, conversant and in no acute stress.  Vascular  Dorsalis pedis and posterior tibial  pulses are palpable  bilaterally.  Capillary return is within normal limits  bilaterally. Temperature is within normal limits  bilaterally.  Neurologic  Senn-Weinstein monofilament wire test within normal limits  bilaterally. Muscle power within normal limits bilaterally.  Nails Thick disfigured discolored nails with subungual debris  from hallux to fifth toes bilaterally. No evidence of bacterial infection or drainage bilaterally.  Orthopedic  No limitations of motion  feet .  No crepitus or effusions noted.  No bony pathology or digital deformities noted  Foot drop right foot.  Skin  normotropic skin with no porokeratosis noted bilaterally.  No signs of infections or ulcers noted.     Onychomycosis  Pain in right toes  Pain in left toes  Consent was obtained for treatment procedures.   Mechanical debridement of nails 1-5  bilaterally performed with a nail nipper.  Filed with dremel without incident.    Return office visit   3 months                  Told patient to return for periodic foot care and evaluation due to potential at risk complications.   Gerrod Maule DPM  

## 2019-12-26 NOTE — Telephone Encounter (Signed)
   Primary Cardiologist: [name], MD  Chart reviewed as part of pre-operative protocol coverage. Patient was contacted 12/26/2019 in reference to pre-operative risk assessment for pending surgery as outlined below.  Vernon Ruiz reports he actually does not wish to have a steroid injection at this time and had contacted the office to let them know this. He reports he is waiting for a call back for the Neurosurgeon's office. I advised I would fax the office regarding his wishes and please reach out to our office once a decision is made regarding treatment.  I will route this recommendation to the requesting party via Epic fax function and remove from pre-op pool. Please call with questions.  Laverda Page, NP 12/26/2019, 4:23 PM

## 2019-12-27 ENCOUNTER — Other Ambulatory Visit: Payer: Self-pay | Admitting: Cardiovascular Disease

## 2019-12-27 DIAGNOSIS — I10 Essential (primary) hypertension: Secondary | ICD-10-CM | POA: Diagnosis not present

## 2019-12-27 DIAGNOSIS — Z8673 Personal history of transient ischemic attack (TIA), and cerebral infarction without residual deficits: Secondary | ICD-10-CM | POA: Diagnosis not present

## 2019-12-27 DIAGNOSIS — I4891 Unspecified atrial fibrillation: Secondary | ICD-10-CM | POA: Diagnosis not present

## 2019-12-27 DIAGNOSIS — E119 Type 2 diabetes mellitus without complications: Secondary | ICD-10-CM | POA: Diagnosis not present

## 2019-12-27 DIAGNOSIS — R809 Proteinuria, unspecified: Secondary | ICD-10-CM | POA: Diagnosis not present

## 2019-12-27 DIAGNOSIS — E785 Hyperlipidemia, unspecified: Secondary | ICD-10-CM | POA: Diagnosis not present

## 2019-12-27 DIAGNOSIS — Z6831 Body mass index (BMI) 31.0-31.9, adult: Secondary | ICD-10-CM | POA: Diagnosis not present

## 2019-12-28 DIAGNOSIS — M5417 Radiculopathy, lumbosacral region: Secondary | ICD-10-CM | POA: Diagnosis not present

## 2019-12-28 DIAGNOSIS — M48062 Spinal stenosis, lumbar region with neurogenic claudication: Secondary | ICD-10-CM | POA: Diagnosis not present

## 2019-12-31 DIAGNOSIS — M48062 Spinal stenosis, lumbar region with neurogenic claudication: Secondary | ICD-10-CM | POA: Diagnosis not present

## 2019-12-31 DIAGNOSIS — M5417 Radiculopathy, lumbosacral region: Secondary | ICD-10-CM | POA: Diagnosis not present

## 2020-01-01 DIAGNOSIS — M48062 Spinal stenosis, lumbar region with neurogenic claudication: Secondary | ICD-10-CM | POA: Diagnosis not present

## 2020-01-01 DIAGNOSIS — M5417 Radiculopathy, lumbosacral region: Secondary | ICD-10-CM | POA: Diagnosis not present

## 2020-01-02 DIAGNOSIS — E785 Hyperlipidemia, unspecified: Secondary | ICD-10-CM | POA: Diagnosis not present

## 2020-01-02 DIAGNOSIS — M47816 Spondylosis without myelopathy or radiculopathy, lumbar region: Secondary | ICD-10-CM | POA: Diagnosis not present

## 2020-01-02 DIAGNOSIS — K219 Gastro-esophageal reflux disease without esophagitis: Secondary | ICD-10-CM | POA: Diagnosis not present

## 2020-01-02 DIAGNOSIS — I10 Essential (primary) hypertension: Secondary | ICD-10-CM | POA: Diagnosis not present

## 2020-01-02 DIAGNOSIS — M48062 Spinal stenosis, lumbar region with neurogenic claudication: Secondary | ICD-10-CM | POA: Diagnosis not present

## 2020-01-02 DIAGNOSIS — E1169 Type 2 diabetes mellitus with other specified complication: Secondary | ICD-10-CM | POA: Diagnosis not present

## 2020-01-02 DIAGNOSIS — I4819 Other persistent atrial fibrillation: Secondary | ICD-10-CM | POA: Diagnosis not present

## 2020-01-02 DIAGNOSIS — I480 Paroxysmal atrial fibrillation: Secondary | ICD-10-CM | POA: Diagnosis not present

## 2020-01-02 DIAGNOSIS — M5417 Radiculopathy, lumbosacral region: Secondary | ICD-10-CM | POA: Diagnosis not present

## 2020-01-03 DIAGNOSIS — Z23 Encounter for immunization: Secondary | ICD-10-CM | POA: Diagnosis not present

## 2020-01-03 DIAGNOSIS — M21371 Foot drop, right foot: Secondary | ICD-10-CM | POA: Diagnosis not present

## 2020-01-07 DIAGNOSIS — M48062 Spinal stenosis, lumbar region with neurogenic claudication: Secondary | ICD-10-CM | POA: Diagnosis not present

## 2020-01-07 DIAGNOSIS — M5417 Radiculopathy, lumbosacral region: Secondary | ICD-10-CM | POA: Diagnosis not present

## 2020-01-08 DIAGNOSIS — M48062 Spinal stenosis, lumbar region with neurogenic claudication: Secondary | ICD-10-CM | POA: Diagnosis not present

## 2020-01-08 DIAGNOSIS — M5417 Radiculopathy, lumbosacral region: Secondary | ICD-10-CM | POA: Diagnosis not present

## 2020-01-09 DIAGNOSIS — M48062 Spinal stenosis, lumbar region with neurogenic claudication: Secondary | ICD-10-CM | POA: Diagnosis not present

## 2020-01-09 DIAGNOSIS — M5417 Radiculopathy, lumbosacral region: Secondary | ICD-10-CM | POA: Diagnosis not present

## 2020-01-14 DIAGNOSIS — M5417 Radiculopathy, lumbosacral region: Secondary | ICD-10-CM | POA: Diagnosis not present

## 2020-01-14 DIAGNOSIS — M48062 Spinal stenosis, lumbar region with neurogenic claudication: Secondary | ICD-10-CM | POA: Diagnosis not present

## 2020-01-15 DIAGNOSIS — M5417 Radiculopathy, lumbosacral region: Secondary | ICD-10-CM | POA: Diagnosis not present

## 2020-01-15 DIAGNOSIS — M48062 Spinal stenosis, lumbar region with neurogenic claudication: Secondary | ICD-10-CM | POA: Diagnosis not present

## 2020-01-21 DIAGNOSIS — M48062 Spinal stenosis, lumbar region with neurogenic claudication: Secondary | ICD-10-CM | POA: Diagnosis not present

## 2020-01-21 DIAGNOSIS — M5417 Radiculopathy, lumbosacral region: Secondary | ICD-10-CM | POA: Diagnosis not present

## 2020-01-22 ENCOUNTER — Other Ambulatory Visit: Payer: Self-pay | Admitting: Cardiovascular Disease

## 2020-01-22 ENCOUNTER — Telehealth: Payer: Self-pay | Admitting: Cardiovascular Disease

## 2020-01-22 MED ORDER — APIXABAN 5 MG PO TABS: 5.0000 mg | ORAL_TABLET | Freq: Two times a day (BID) | ORAL | 1 refills | Status: DC

## 2020-01-22 NOTE — Telephone Encounter (Signed)
Pt last saw Dr Excell Seltzer 03/28/20 telemedicine Covid-19, last labs 07/18/19 Creat 0.87, age 69, weight 89.2kg, based on specified criteria pt is on appropriate dosage of Eliquis 5mg  BID.  Will refill rx.

## 2020-01-22 NOTE — Telephone Encounter (Signed)
  *  STAT* If patient is at the pharmacy, call can be transferred to refill team.   1. Which medications need to be refilled? (please list name of each medication and dose if known) apixaban (ELIQUIS) 5 MG TABS tablet  2. Which pharmacy/location (including street and city if local pharmacy) is medication to be sent to? [id number] - [place], [place] - [address]  3. Do they need a 30 day or 90 day supply? 90 days  

## 2020-01-22 NOTE — Telephone Encounter (Signed)
    Morrie Sheldon called, she said pt's lipid is due and would like to ask Dr. Excell Seltzer if he can order lipid labs for pt

## 2020-01-23 DIAGNOSIS — M5417 Radiculopathy, lumbosacral region: Secondary | ICD-10-CM | POA: Diagnosis not present

## 2020-01-23 DIAGNOSIS — M48062 Spinal stenosis, lumbar region with neurogenic claudication: Secondary | ICD-10-CM | POA: Diagnosis not present

## 2020-01-24 NOTE — Telephone Encounter (Signed)
Vernon Ruiz, pharmacist states she will have nurses at [facility] order labs since he is not due back until next summer.

## 2020-01-24 NOTE — Telephone Encounter (Signed)
Number given is not in service.  Called the patient's PCP and was informed Louretta Parma, a pharmacist, called. She will give Morrie Sheldon a message to call back. Will confirm a lipid panel is the only necessary lab. Will explain he is not due back until next year so it may be more convenient for the patient if the labs are done there (if he is due sooner).

## 2020-01-24 NOTE — Telephone Encounter (Signed)
Morrie Sheldon from [facility] returning call.

## 2020-01-25 DIAGNOSIS — M48062 Spinal stenosis, lumbar region with neurogenic claudication: Secondary | ICD-10-CM | POA: Diagnosis not present

## 2020-01-25 DIAGNOSIS — M5417 Radiculopathy, lumbosacral region: Secondary | ICD-10-CM | POA: Diagnosis not present

## 2020-01-28 ENCOUNTER — Telehealth: Payer: Self-pay | Admitting: Cardiovascular Disease

## 2020-01-28 MED ORDER — ENOXAPARIN SODIUM 80 MG/0.8ML ~~LOC~~ SOLN: 80.0000 mg | Freq: Two times a day (BID) | SUBCUTANEOUS | 0 refills | Status: DC

## 2020-01-28 NOTE — Telephone Encounter (Signed)
Spoke to patient and his wife to give instructions: pt took a dose of Eliquis this AM at 8AM. Surgeon aware and procedure was moved to Thursday.  10/18: Give lovenox 80mg  injection into belly 2 inches away from belly button at 11 PM  10/19: Give lovenox 80mg  injection into belly 2 inches away from belly button at 11 AM and 11 PM  10/20: Give lovenox 80mg  injection into belly 2 inches away from belly button at 11 AM. NO evening dose  10/21: procedure day 1:00PM- no lovenox, no Eliquis  Resume Eliquis when MD stays is ok

## 2020-01-28 NOTE — Telephone Encounter (Signed)
° ° °  Pt c/o medication issue:  1. Name of Medication: lovenox  2. How are you currently taking this medication (dosage and times per day)?   3. Are you having a reaction (difficulty breathing--STAT)?  4. What is your medication issue? Pt would like to ask about the lovenox, he wanted to know when he needs to take it and for how long. He said he needs to get a callback today since his procedure is on Thursday  Check clearance request on 12/25/2019 for lovenox

## 2020-01-30 DIAGNOSIS — M48062 Spinal stenosis, lumbar region with neurogenic claudication: Secondary | ICD-10-CM | POA: Diagnosis not present

## 2020-01-31 DIAGNOSIS — M5416 Radiculopathy, lumbar region: Secondary | ICD-10-CM | POA: Diagnosis not present

## 2020-01-31 DIAGNOSIS — M5116 Intervertebral disc disorders with radiculopathy, lumbar region: Secondary | ICD-10-CM | POA: Diagnosis not present

## 2020-02-06 DIAGNOSIS — Z981 Arthrodesis status: Secondary | ICD-10-CM | POA: Diagnosis not present

## 2020-02-06 DIAGNOSIS — M4316 Spondylolisthesis, lumbar region: Secondary | ICD-10-CM | POA: Diagnosis not present

## 2020-02-06 DIAGNOSIS — M48061 Spinal stenosis, lumbar region without neurogenic claudication: Secondary | ICD-10-CM | POA: Diagnosis not present

## 2020-02-06 DIAGNOSIS — M549 Dorsalgia, unspecified: Secondary | ICD-10-CM | POA: Diagnosis not present

## 2020-02-06 DIAGNOSIS — M4807 Spinal stenosis, lumbosacral region: Secondary | ICD-10-CM | POA: Diagnosis not present

## 2020-02-06 DIAGNOSIS — M47816 Spondylosis without myelopathy or radiculopathy, lumbar region: Secondary | ICD-10-CM | POA: Diagnosis not present

## 2020-02-06 DIAGNOSIS — M4802 Spinal stenosis, cervical region: Secondary | ICD-10-CM | POA: Diagnosis not present

## 2020-02-07 ENCOUNTER — Other Ambulatory Visit: Payer: Self-pay | Admitting: Orthopedic Surgery

## 2020-02-07 DIAGNOSIS — M4807 Spinal stenosis, lumbosacral region: Secondary | ICD-10-CM

## 2020-02-07 DIAGNOSIS — K219 Gastro-esophageal reflux disease without esophagitis: Secondary | ICD-10-CM | POA: Diagnosis not present

## 2020-02-07 DIAGNOSIS — I10 Essential (primary) hypertension: Secondary | ICD-10-CM | POA: Diagnosis not present

## 2020-02-07 DIAGNOSIS — M4802 Spinal stenosis, cervical region: Secondary | ICD-10-CM

## 2020-02-07 DIAGNOSIS — E1169 Type 2 diabetes mellitus with other specified complication: Secondary | ICD-10-CM | POA: Diagnosis not present

## 2020-02-07 DIAGNOSIS — I480 Paroxysmal atrial fibrillation: Secondary | ICD-10-CM | POA: Diagnosis not present

## 2020-02-07 DIAGNOSIS — E785 Hyperlipidemia, unspecified: Secondary | ICD-10-CM | POA: Diagnosis not present

## 2020-02-08 DIAGNOSIS — M48062 Spinal stenosis, lumbar region with neurogenic claudication: Secondary | ICD-10-CM | POA: Diagnosis not present

## 2020-02-08 DIAGNOSIS — M5417 Radiculopathy, lumbosacral region: Secondary | ICD-10-CM | POA: Diagnosis not present

## 2020-02-11 DIAGNOSIS — M48062 Spinal stenosis, lumbar region with neurogenic claudication: Secondary | ICD-10-CM | POA: Diagnosis not present

## 2020-02-11 DIAGNOSIS — M5417 Radiculopathy, lumbosacral region: Secondary | ICD-10-CM | POA: Diagnosis not present

## 2020-02-13 DIAGNOSIS — M5417 Radiculopathy, lumbosacral region: Secondary | ICD-10-CM | POA: Diagnosis not present

## 2020-02-13 DIAGNOSIS — M48062 Spinal stenosis, lumbar region with neurogenic claudication: Secondary | ICD-10-CM | POA: Diagnosis not present

## 2020-02-18 DIAGNOSIS — M5417 Radiculopathy, lumbosacral region: Secondary | ICD-10-CM | POA: Diagnosis not present

## 2020-02-18 DIAGNOSIS — M48062 Spinal stenosis, lumbar region with neurogenic claudication: Secondary | ICD-10-CM | POA: Diagnosis not present

## 2020-02-19 ENCOUNTER — Other Ambulatory Visit: Payer: Self-pay | Admitting: Cardiovascular Disease

## 2020-02-27 DIAGNOSIS — M48062 Spinal stenosis, lumbar region with neurogenic claudication: Secondary | ICD-10-CM | POA: Diagnosis not present

## 2020-02-27 DIAGNOSIS — M5417 Radiculopathy, lumbosacral region: Secondary | ICD-10-CM | POA: Diagnosis not present

## 2020-03-02 ENCOUNTER — Ambulatory Visit
Admission: RE | Admit: 2020-03-02 | Discharge: 2020-03-02 | Disposition: A | Discharge status: Home / Self Care | Payer: Medicare Other | Source: Ambulatory Visit | Attending: Orthopedic Surgery | Admitting: Orthopedic Surgery

## 2020-03-02 DIAGNOSIS — M4312 Spondylolisthesis, cervical region: Secondary | ICD-10-CM | POA: Diagnosis not present

## 2020-03-02 DIAGNOSIS — M48061 Spinal stenosis, lumbar region without neurogenic claudication: Secondary | ICD-10-CM | POA: Diagnosis not present

## 2020-03-02 DIAGNOSIS — M47812 Spondylosis without myelopathy or radiculopathy, cervical region: Secondary | ICD-10-CM | POA: Diagnosis not present

## 2020-03-02 DIAGNOSIS — M4807 Spinal stenosis, lumbosacral region: Secondary | ICD-10-CM

## 2020-03-02 DIAGNOSIS — R200 Anesthesia of skin: Secondary | ICD-10-CM | POA: Diagnosis not present

## 2020-03-02 DIAGNOSIS — M5126 Other intervertebral disc displacement, lumbar region: Secondary | ICD-10-CM | POA: Diagnosis not present

## 2020-03-02 DIAGNOSIS — R531 Weakness: Secondary | ICD-10-CM | POA: Diagnosis not present

## 2020-03-02 DIAGNOSIS — M4802 Spinal stenosis, cervical region: Secondary | ICD-10-CM | POA: Diagnosis not present

## 2020-03-02 DIAGNOSIS — M5021 Other cervical disc displacement,  high cervical region: Secondary | ICD-10-CM | POA: Diagnosis not present

## 2020-03-02 MED ORDER — GADOBENATE DIMEGLUMINE 529 MG/ML IV SOLN
17.0000 mL | Freq: Once | INTRAVENOUS | Status: AC | PRN
  Administered 2020-03-02: 17 mL via INTRAVENOUS

## 2020-03-05 DIAGNOSIS — M5417 Radiculopathy, lumbosacral region: Secondary | ICD-10-CM | POA: Diagnosis not present

## 2020-03-05 DIAGNOSIS — M48062 Spinal stenosis, lumbar region with neurogenic claudication: Secondary | ICD-10-CM | POA: Diagnosis not present

## 2020-03-13 DIAGNOSIS — M48062 Spinal stenosis, lumbar region with neurogenic claudication: Secondary | ICD-10-CM | POA: Diagnosis not present

## 2020-03-13 DIAGNOSIS — M5417 Radiculopathy, lumbosacral region: Secondary | ICD-10-CM | POA: Diagnosis not present

## 2020-03-18 DIAGNOSIS — M5417 Radiculopathy, lumbosacral region: Secondary | ICD-10-CM | POA: Diagnosis not present

## 2020-03-18 DIAGNOSIS — M48062 Spinal stenosis, lumbar region with neurogenic claudication: Secondary | ICD-10-CM | POA: Diagnosis not present

## 2020-03-19 DIAGNOSIS — M48061 Spinal stenosis, lumbar region without neurogenic claudication: Secondary | ICD-10-CM | POA: Diagnosis not present

## 2020-03-19 DIAGNOSIS — M4802 Spinal stenosis, cervical region: Secondary | ICD-10-CM | POA: Diagnosis not present

## 2020-03-20 DIAGNOSIS — M5417 Radiculopathy, lumbosacral region: Secondary | ICD-10-CM | POA: Diagnosis not present

## 2020-03-20 DIAGNOSIS — M48062 Spinal stenosis, lumbar region with neurogenic claudication: Secondary | ICD-10-CM | POA: Diagnosis not present

## 2020-03-25 DIAGNOSIS — M48062 Spinal stenosis, lumbar region with neurogenic claudication: Secondary | ICD-10-CM | POA: Diagnosis not present

## 2020-03-25 DIAGNOSIS — M5417 Radiculopathy, lumbosacral region: Secondary | ICD-10-CM | POA: Diagnosis not present

## 2020-03-26 ENCOUNTER — Other Ambulatory Visit: Payer: Self-pay

## 2020-03-26 ENCOUNTER — Other Ambulatory Visit: Payer: Self-pay | Admitting: Cardiovascular Disease

## 2020-03-26 ENCOUNTER — Encounter: Payer: Self-pay | Admitting: Podiatry

## 2020-03-26 ENCOUNTER — Ambulatory Visit (INDEPENDENT_AMBULATORY_CARE_PROVIDER_SITE_OTHER): Payer: Medicare Other | Admitting: Podiatry

## 2020-03-26 DIAGNOSIS — B351 Tinea unguium: Secondary | ICD-10-CM | POA: Diagnosis not present

## 2020-03-26 DIAGNOSIS — E1151 Type 2 diabetes mellitus with diabetic peripheral angiopathy without gangrene: Secondary | ICD-10-CM | POA: Diagnosis not present

## 2020-03-26 DIAGNOSIS — E1169 Type 2 diabetes mellitus with other specified complication: Secondary | ICD-10-CM

## 2020-03-26 DIAGNOSIS — D689 Coagulation defect, unspecified: Secondary | ICD-10-CM

## 2020-03-26 NOTE — Progress Notes (Signed)
This patient returns to my office for at risk foot care.  This patient requires this care by a professional since this patient will be at risk due to having coagulation defect due to eliquis.  This patient is unable to cut nails himself since the patient cannot reach his nails.These nails are painful walking and wearing shoes.  This patient presents for at risk foot care today.  General Appearance  Alert, conversant and in no acute stress.  Vascular  Dorsalis pedis and posterior tibial  pulses are palpable  bilaterally.  Capillary return is within normal limits  bilaterally. Temperature is within normal limits  bilaterally.  Neurologic  Senn-Weinstein monofilament wire test within normal limits  bilaterally. Muscle power within normal limits bilaterally.  Nails Thick disfigured discolored nails with subungual debris  from hallux to fifth toes bilaterally. No evidence of bacterial infection or drainage bilaterally.  Orthopedic  No limitations of motion  feet .  No crepitus or effusions noted.  No bony pathology or digital deformities noted  Foot drop right foot.  Skin  normotropic skin with no porokeratosis noted bilaterally.  No signs of infections or ulcers noted.     Onychomycosis  Pain in right toes  Pain in left toes  Consent was obtained for treatment procedures.   Mechanical debridement of nails 1-5  bilaterally performed with a nail nipper.  Filed with dremel without incident.    Return office visit   3 months                  Told patient to return for periodic foot care and evaluation due to potential at risk complications.   Tywan Siever DPM  

## 2020-03-28 DIAGNOSIS — M5417 Radiculopathy, lumbosacral region: Secondary | ICD-10-CM | POA: Diagnosis not present

## 2020-03-28 DIAGNOSIS — M48062 Spinal stenosis, lumbar region with neurogenic claudication: Secondary | ICD-10-CM | POA: Diagnosis not present

## 2020-03-31 DIAGNOSIS — M5417 Radiculopathy, lumbosacral region: Secondary | ICD-10-CM | POA: Diagnosis not present

## 2020-03-31 DIAGNOSIS — M48062 Spinal stenosis, lumbar region with neurogenic claudication: Secondary | ICD-10-CM | POA: Diagnosis not present

## 2020-04-02 DIAGNOSIS — M48062 Spinal stenosis, lumbar region with neurogenic claudication: Secondary | ICD-10-CM | POA: Diagnosis not present

## 2020-04-02 DIAGNOSIS — M5417 Radiculopathy, lumbosacral region: Secondary | ICD-10-CM | POA: Diagnosis not present

## 2020-04-08 DIAGNOSIS — M5417 Radiculopathy, lumbosacral region: Secondary | ICD-10-CM | POA: Diagnosis not present

## 2020-04-08 DIAGNOSIS — M48062 Spinal stenosis, lumbar region with neurogenic claudication: Secondary | ICD-10-CM | POA: Diagnosis not present

## 2020-04-10 DIAGNOSIS — M5417 Radiculopathy, lumbosacral region: Secondary | ICD-10-CM | POA: Diagnosis not present

## 2020-04-10 DIAGNOSIS — M48062 Spinal stenosis, lumbar region with neurogenic claudication: Secondary | ICD-10-CM | POA: Diagnosis not present

## 2020-04-15 DIAGNOSIS — M48062 Spinal stenosis, lumbar region with neurogenic claudication: Secondary | ICD-10-CM | POA: Diagnosis not present

## 2020-04-15 DIAGNOSIS — M5417 Radiculopathy, lumbosacral region: Secondary | ICD-10-CM | POA: Diagnosis not present

## 2020-04-23 DIAGNOSIS — I10 Essential (primary) hypertension: Secondary | ICD-10-CM | POA: Diagnosis not present

## 2020-04-23 DIAGNOSIS — Z6829 Body mass index (BMI) 29.0-29.9, adult: Secondary | ICD-10-CM | POA: Diagnosis not present

## 2020-04-23 DIAGNOSIS — M48062 Spinal stenosis, lumbar region with neurogenic claudication: Secondary | ICD-10-CM | POA: Diagnosis not present

## 2020-04-24 ENCOUNTER — Other Ambulatory Visit: Payer: Self-pay | Admitting: Cardiovascular Disease

## 2020-04-25 ENCOUNTER — Telehealth: Payer: Self-pay | Admitting: Cardiovascular Disease

## 2020-04-25 DIAGNOSIS — M48062 Spinal stenosis, lumbar region with neurogenic claudication: Secondary | ICD-10-CM | POA: Diagnosis not present

## 2020-04-25 DIAGNOSIS — M5417 Radiculopathy, lumbosacral region: Secondary | ICD-10-CM | POA: Diagnosis not present

## 2020-04-25 MED ORDER — METOPROLOL TARTRATE 25 MG PO TABS: 25.0000 mg | ORAL_TABLET | Freq: Two times a day (BID) | ORAL | 0 refills | Status: DC

## 2020-04-25 NOTE — Telephone Encounter (Signed)
Pt's medication was sent to pt's pharmacy as requested. Confirmation received.  °

## 2020-04-25 NOTE — Telephone Encounter (Signed)
Pt c/o medication issue:  1. Name of Medication: metoprolol tartrate (LOPRESSOR) 25 MG tablet (90 tablets)  2. How are you currently taking this medication (dosage and times per day)? As written  3. Are you having a reaction (difficulty breathing--STAT)? No   4. What is your medication issue? Patient needed to make appt with dr. appt made for 05/28/19 at 1:45 pm with [facility]

## 2020-04-29 DIAGNOSIS — M47816 Spondylosis without myelopathy or radiculopathy, lumbar region: Secondary | ICD-10-CM | POA: Diagnosis not present

## 2020-04-29 DIAGNOSIS — K219 Gastro-esophageal reflux disease without esophagitis: Secondary | ICD-10-CM | POA: Diagnosis not present

## 2020-04-29 DIAGNOSIS — I480 Paroxysmal atrial fibrillation: Secondary | ICD-10-CM | POA: Diagnosis not present

## 2020-04-29 DIAGNOSIS — E785 Hyperlipidemia, unspecified: Secondary | ICD-10-CM | POA: Diagnosis not present

## 2020-04-29 DIAGNOSIS — E1169 Type 2 diabetes mellitus with other specified complication: Secondary | ICD-10-CM | POA: Diagnosis not present

## 2020-04-29 DIAGNOSIS — I10 Essential (primary) hypertension: Secondary | ICD-10-CM | POA: Diagnosis not present

## 2020-05-06 DIAGNOSIS — M48062 Spinal stenosis, lumbar region with neurogenic claudication: Secondary | ICD-10-CM | POA: Diagnosis not present

## 2020-05-06 DIAGNOSIS — M5417 Radiculopathy, lumbosacral region: Secondary | ICD-10-CM | POA: Diagnosis not present

## 2020-05-08 DIAGNOSIS — M48062 Spinal stenosis, lumbar region with neurogenic claudication: Secondary | ICD-10-CM | POA: Diagnosis not present

## 2020-05-08 DIAGNOSIS — M5417 Radiculopathy, lumbosacral region: Secondary | ICD-10-CM | POA: Diagnosis not present

## 2020-05-13 DIAGNOSIS — M48062 Spinal stenosis, lumbar region with neurogenic claudication: Secondary | ICD-10-CM | POA: Diagnosis not present

## 2020-05-13 DIAGNOSIS — M5417 Radiculopathy, lumbosacral region: Secondary | ICD-10-CM | POA: Diagnosis not present

## 2020-05-14 ENCOUNTER — Other Ambulatory Visit: Payer: Self-pay | Admitting: Cardiovascular Disease

## 2020-05-14 DIAGNOSIS — G1220 Motor neuron disease, unspecified: Secondary | ICD-10-CM | POA: Diagnosis not present

## 2020-05-14 DIAGNOSIS — M48061 Spinal stenosis, lumbar region without neurogenic claudication: Secondary | ICD-10-CM | POA: Diagnosis not present

## 2020-05-14 DIAGNOSIS — R94131 Abnormal electromyogram [EMG]: Secondary | ICD-10-CM | POA: Diagnosis not present

## 2020-05-16 ENCOUNTER — Other Ambulatory Visit: Payer: Self-pay | Admitting: Cardiovascular Disease

## 2020-05-16 DIAGNOSIS — M5417 Radiculopathy, lumbosacral region: Secondary | ICD-10-CM | POA: Diagnosis not present

## 2020-05-16 DIAGNOSIS — M48062 Spinal stenosis, lumbar region with neurogenic claudication: Secondary | ICD-10-CM | POA: Diagnosis not present

## 2020-05-20 DIAGNOSIS — M5417 Radiculopathy, lumbosacral region: Secondary | ICD-10-CM | POA: Diagnosis not present

## 2020-05-20 DIAGNOSIS — M48062 Spinal stenosis, lumbar region with neurogenic claudication: Secondary | ICD-10-CM | POA: Diagnosis not present

## 2020-05-22 DIAGNOSIS — M5417 Radiculopathy, lumbosacral region: Secondary | ICD-10-CM | POA: Diagnosis not present

## 2020-05-22 DIAGNOSIS — M48062 Spinal stenosis, lumbar region with neurogenic claudication: Secondary | ICD-10-CM | POA: Diagnosis not present

## 2020-05-24 ENCOUNTER — Other Ambulatory Visit: Payer: Self-pay | Admitting: Cardiovascular Disease

## 2020-05-26 ENCOUNTER — Other Ambulatory Visit: Payer: Self-pay | Admitting: Cardiovascular Disease

## 2020-05-26 DIAGNOSIS — M549 Dorsalgia, unspecified: Secondary | ICD-10-CM | POA: Diagnosis not present

## 2020-05-26 DIAGNOSIS — M21379 Foot drop, unspecified foot: Secondary | ICD-10-CM | POA: Diagnosis not present

## 2020-05-26 DIAGNOSIS — G4733 Obstructive sleep apnea (adult) (pediatric): Secondary | ICD-10-CM | POA: Diagnosis not present

## 2020-05-26 DIAGNOSIS — M5417 Radiculopathy, lumbosacral region: Secondary | ICD-10-CM | POA: Diagnosis not present

## 2020-05-26 DIAGNOSIS — M48062 Spinal stenosis, lumbar region with neurogenic claudication: Secondary | ICD-10-CM | POA: Diagnosis not present

## 2020-05-26 DIAGNOSIS — R94131 Abnormal electromyogram [EMG]: Secondary | ICD-10-CM | POA: Diagnosis not present

## 2020-05-26 DIAGNOSIS — E1169 Type 2 diabetes mellitus with other specified complication: Secondary | ICD-10-CM | POA: Diagnosis not present

## 2020-05-27 ENCOUNTER — Ambulatory Visit (INDEPENDENT_AMBULATORY_CARE_PROVIDER_SITE_OTHER): Payer: Medicare Other | Admitting: Physician Assistant

## 2020-05-27 ENCOUNTER — Other Ambulatory Visit: Payer: Self-pay

## 2020-05-27 ENCOUNTER — Encounter: Payer: Self-pay | Admitting: Physician Assistant

## 2020-05-27 VITALS — BP 120/60 | HR 57 | Ht 65.0 in | Wt 187.0 lb

## 2020-05-27 DIAGNOSIS — I350 Nonrheumatic aortic (valve) stenosis: Secondary | ICD-10-CM

## 2020-05-27 DIAGNOSIS — I359 Nonrheumatic aortic valve disorder, unspecified: Secondary | ICD-10-CM | POA: Diagnosis not present

## 2020-05-27 DIAGNOSIS — I10 Essential (primary) hypertension: Secondary | ICD-10-CM | POA: Diagnosis not present

## 2020-05-27 DIAGNOSIS — E785 Hyperlipidemia, unspecified: Secondary | ICD-10-CM | POA: Diagnosis not present

## 2020-05-27 DIAGNOSIS — I480 Paroxysmal atrial fibrillation: Secondary | ICD-10-CM

## 2020-05-27 MED ORDER — METOPROLOL TARTRATE 25 MG PO TABS: 25.0000 mg | ORAL_TABLET | Freq: Two times a day (BID) | ORAL | 3 refills | Status: DC

## 2020-05-27 NOTE — Progress Notes (Signed)
Cardiology Office Note:    Date:  05/27/2020   ID:  Vernon Ruiz, DOB 1950-08-06, MRN [id number]  PCP:  [name], MD  [facility] Cardiologist:  [name], MD  [facility] Electrophysiologist:  None   Chief Complaint: one year follow up  History of Present Illness:    Vernon Ruiz is a 70 y.o. male with a hx of with aortic valve disease, DM, HTN, OSA, paroxysmal atrial fibrillation, CVA 12/2018  and COVID 19 03/2019 presents for follow-up evaluation.   The patient underwent bioprosthetic aortic valve replacement in 2018 with a 23 mm magna ease pericardial tissue valve. His heart surgery was uncomplicated. Cath prior surgery in 2017 showed diffuse non obstructive CAD.   Last echo 12/2018 showed LVEF of 60-65% and grade 2 DD. Poorly visualized AV bioprosthesis. There is no perivalvular AI. The  mean AV gradient is stable at 14.55mmHg.   Patient with history of CVA September 2020 while on Eliquis.  Thought it was related to atrial fibrillation.  Did not change anticoagulation due to insurance coverage.  Patient been dealing with spinal stenosis at lumbar and cervical spine.  Underwent surgery in 2021.  He is also dealing with right foot drop for last 1 to 2 years.  No improvement in his right leg weakness and ambulation despite surgery.  He was seen by neurology at [facility]  05/14/2020.  He had abnormal EMG study.  Referral sent to neuromuscular specialist for further evaluation with suspected progressive lower motor neuron disorder, likely ALS.  Here today for follow-up with wife.  He has no cardiac complaint.  He denies chest pain, shortness of breath, orthopnea, PND, dizziness, palpitation or syncope.  Uses walker for ambulation.   Past Medical History:  Diagnosis Date  . Aortic stenosis    s/p AVR using a 77mm Edwards Magna-Ease pericardial valve 10/04/16  . Coronary artery disease   . Diabetes mellitus without complication (HCC)   . GERD (gastroesophageal reflux disease)    . Heart murmur   . Hypertension   . Inguinal hernia 1970s  . Obstructive sleep apnea    positive from a home test, still needs to get another study done  . Persistent atrial fibrillation (HCC)    s/p clipping of LA appendage 10/04/16  . Right carotid bruit 12/12/2018  . Right foot drop 11/07/2018  . Stroke [facility]) 12/12/2018    Past Surgical History:  Procedure Laterality Date  . ANTERIOR LAT LUMBAR FUSION N/A 07/17/2019   Procedure: Lumbar one-two Lumbar two-three Lumbar three-four Anterolateral lumbar interbody fusion with posterior fixation Lumbar one to Lumbar four with Mazor;  Surgeon: [name], MD;  [facility];  Service: Neurosurgery;  Laterality: N/A;  . AORTIC VALVE REPLACEMENT N/A 10/04/2016   Procedure: AORTIC VALVE REPLACEMENT (AVR);  Surgeon: [name], MD;  Location: [facility] OR;  Service: Open Heart Surgery;  Laterality: N/A;  . APPENDECTOMY    . APPLICATION OF ROBOTIC ASSISTANCE FOR SPINAL PROCEDURE N/A 07/17/2019   Procedure: APPLICATION OF ROBOTIC ASSISTANCE FOR SPINAL PROCEDURE;  Surgeon: [name], MD;  Location: [facility] OR;  Service: Neurosurgery;  Laterality: N/A;  . BACK SURGERY    . CARDIAC CATHETERIZATION N/A 03/10/2016   Procedure: Right/Left Heart Cath and Coronary Angiography;  Surgeon: [name], MD;  Location: [facility] INVASIVE CV LAB;  Service: Cardiovascular;  Laterality: N/A;  . CERVICAL SPINE SURGERY     07/13/2019: per patient 2020  . CLIPPING OF ATRIAL APPENDAGE N/A 10/04/2016   Procedure: CLIPPING OF  ATRIAL APPENDAGE;  Surgeon: [name], MD;  Location: [facility] OR;  Service: Open Heart Surgery;  Laterality: N/A;  . COLONOSCOPY    . EYE SURGERY Right    growth removed from eye  . HERNIA REPAIR Bilateral    inguinal  . LUMBAR PERCUTANEOUS PEDICLE SCREW 3 LEVEL N/A 07/17/2019   Procedure: Posterior fixation Lumbar 1 to Lumbar 4;  Surgeon: [name], MD;  Location: [facility] OR;  Service: Neurosurgery;  Laterality: N/A;  . TEE WITHOUT CARDIOVERSION N/A  10/04/2016   Procedure: TRANSESOPHAGEAL ECHOCARDIOGRAM (TEE);  Surgeon: [name], MD;  Location: [facility] OR;  Service: Open Heart Surgery;  Laterality: N/A;    Current Medications: Current Meds  Medication Sig  . acetaminophen (TYLENOL) 325 MG tablet Take 2 tablets (650 mg total) by mouth every 6 (six) hours as needed for mild pain.  Marland Kitchen amLODipine (NORVASC) 5 MG tablet Take 1 tablet (5 mg total) by mouth daily. Please call and schedule follow up office visit. [phone]  . apixaban (ELIQUIS) 5 MG TABS tablet Take 1 tablet (5 mg total) by mouth 2 (two) times daily.  . B Complex-C (B-COMPLEX WITH VITAMIN C) tablet Take 1 tablet by mouth 3 (three) times a week.  . Cholecalciferol (VITAMIN D3) 250 MCG (10000 UT) capsule Take 10,000 Units by mouth 3 (three) times a week.  . Dulaglutide (TRULICITY) 1.5 MG/0.5ML SOPN Inject 1.5 mg into the skin every Sunday.   . empagliflozin (JARDIANCE) 25 MG TABS tablet Take 12.5 mg by mouth daily.  Marland Kitchen enoxaparin (LOVENOX) 80 MG/0.8ML injection Inject 0.8 mLs (80 mg total) into the skin every 12 (twelve) hours.  . famotidine (PEPCID) 20 MG tablet Take 1 tablet (20 mg total) by mouth 2 (two) times daily for 15 days. (Patient taking differently: Take 20 mg by mouth 2 (two) times daily as needed (pain.).)  . furosemide (LASIX) 20 MG tablet TAKE 1 TABLET BY MOUTH DAILY AS NEEDED FOR SWELLING  . glimepiride (AMARYL) 4 MG tablet Take 4 mg by mouth 2 (two) times daily.   Marland Kitchen losartan (COZAAR) 100 MG tablet Take 100 mg by mouth daily.  . meclizine (ANTIVERT) 25 MG tablet Take 25 mg by mouth 2 (two) times daily as needed (dizziness/vertigo).   . metFORMIN (GLUCOPHAGE-XR) 500 MG 24 hr tablet Take 1,000 mg by mouth 2 (two) times daily.  . methocarbamol (ROBAXIN) 500 MG tablet Take 1 tablet (500 mg total) by mouth every 6 (six) hours as needed for muscle spasms.  . metoprolol tartrate (LOPRESSOR) 25 MG tablet Take 1 tablet (25 mg total) by mouth 2 (two) times daily. Please keep  upcoming appt in February 2022 before anymore refills. Thank you Final Attemp  . omeprazole (PRILOSEC) 20 MG capsule Take 20 mg by mouth 2 (two) times daily as needed.  Marland Kitchen oxyCODONE-acetaminophen (PERCOCET/ROXICET) 5-325 MG tablet Take 1-2 tablets by mouth every 3 (three) hours as needed for moderate pain or severe pain.  Nathen May SOLOSTAR 300 UNIT/ML Solostar Pen Inject 35 Units into the skin daily.   . vitamin C (ASCORBIC ACID) 500 MG tablet Take 1,500 mg by mouth 3 (three) times a week.   . zinc gluconate 50 MG tablet Take 50 mg by mouth daily.     Allergies:   Penicillins and Sulfa antibiotics   Social History   Socioeconomic History  . Marital status: Married    Spouse name: Not on file  . Number of children: Not on file  . Years of education: Not on  file  . Highest education level: Not on file  Occupational History  . Not on file  Tobacco Use  . Smoking status: Former Smoker    Types: Cigars    Quit date: 09/16/2016    Years since quitting: 3.6  . Smokeless tobacco: Never Used  Vaping Use  . Vaping Use: Never used  Substance and Sexual Activity  . Alcohol use: Yes    Comment: very rare  . Drug use: No  . Sexual activity: Not on file  Other Topics Concern  . Not on file  Social History Narrative   Retired   Owns hotels   Social Determinants of Corporate investment banker Strain: Not on [employer] Insecurity: Not on file  Transportation Needs: Not on file  Physical Activity: Not on file  Stress: Not on file  Social Connections: Not on file     Family History: The patient's family history is not on file.  ROS:   Please see the history of present illness.    All other systems reviewed and are negative.   EKGs/Labs/Other Studies Reviewed:    The following studies were reviewed today:  Echo 12/2018 1. The left ventricle has normal systolic function with an ejection  fraction of 60-65%. The cavity size was normal. There is mildly increased  left ventricular  wall thickness. Left ventricular diastolic Doppler  parameters are consistent with  pseudonormalization.  2. The right ventricle has normal systolic function. The cavity was  mildly enlarged. There is no increase in right ventricular wall thickness.  Right ventricular systolic pressure could not be assessed.  3. Left atrial size was mildly dilated.  4. Right atrial size was mildly dilated.  5. Poorly visualized AV bioprosthesis. There is no perivalvular AI. The  mean AV gradient is stable at 14.59mmHg.  6. The aorta is normal unless otherwise noted.  7. The inferior vena cava was dilated in size with >50% respiratory  variability.   Right/Left Heart Cath and Coronary Angiography  02/2016    Conclusion  1. Diffuse nonobstructive CAD 2. Moderately severe aortic stenosis 3. Normal right heart hemodynamics/normal LVEDP  Pt with echo criteria for severe aortic stenosis (mean gradient 42 mmHg, peak velocity 4 m/s) and borderline cath criteria. Reasonable to consider aortic valve replacement as he does have angina at a high workload with no other explanation for symptoms. He wishes to wait several months for surgical referral because of insurance reasons. He is counseled about keeping an eye out for progressive symptoms.   Diagnostic Dominance: Right    EKG:  EKG is ordered today.  The ekg ordered today demonstrates sinus bradycardia at rate of 57 bpm, PAC  Recent Labs: 07/13/2019: Hemoglobin 15.3; Platelets 276 07/18/2019: BUN 20; Creatinine, Ser 0.87; Potassium 3.8; Sodium 137  Recent Lipid Panel    Component Value Date/Time   CHOL 204 (H) 12/13/2018 0322   TRIG 204 (H) 12/13/2018 0322   HDL 39 (L) 12/13/2018 0322   CHOLHDL 5.2 12/13/2018 0322   VLDL 41 (H) 12/13/2018 0322   LDLCALC 124 (H) 12/13/2018 0322    Physical Exam:    VS:  BP 120/60   Pulse (!) 57   Ht 5\' 5"  (1.651 m)   Wt 187 lb (84.8 kg)   SpO2 98%   BMI 31.12 kg/m     Wt Readings from Last 3  Encounters:  05/27/20 187 lb (84.8 kg)  07/17/19 196 lb 11.2 oz (89.2 kg)  07/13/19 196 lb 11.2 oz (  89.2 kg)     GEN:  Well nourished, well developed in no acute distress HEENT: Normal NECK: No JVD; No carotid bruits LYMPHATICS: No lymphadenopathy CARDIAC: *RRR, no murmurs, rubs, gallops RESPIRATORY:  Clear to auscultation without rales, wheezing or rhonchi  ABDOMEN: Soft, non-tender, non-distended MUSCULOSKELETAL: Trace right lower extremity edema  SKIN: Warm and dry NEUROLOGIC:  Alert and oriented x 3 PSYCHIATRIC:  Normal affect   ASSESSMENT AND PLAN:    1. S/p bioprosthetic aortic valve -Normal functioning valve by echocardiogram 12/2018 -No dyspnea or syncope  2.  PAF -Maintaining sinus rhythm.  Continue Eliquis and low pressure -No bleeding issue  3.  History of stroke September 2020 while on Eliquis - Thought it was related to atrial fibrillation.  Did not change anticoagulation due to insurance coverage. - No reoccurrence   4.  Neuromuscular disorder/spinal stenosis -Has follow-up at [facility] in few weeks  5. HTN - BP stable  Medication Adjustments/Labs and Tests Ordered: Current medicines are reviewed at length with the patient today.  Concerns regarding medicines are outlined above.  No orders of the defined types were placed in this encounter.  No orders of the defined types were placed in this encounter.   There are no Patient Instructions on file for this visit.   Lorelei Pont, [place]  05/27/2020 2:16 PM    [facility]

## 2020-05-27 NOTE — Patient Instructions (Addendum)

## 2020-05-30 DIAGNOSIS — T63481D Toxic effect of venom of other arthropod, accidental (unintentional), subsequent encounter: Secondary | ICD-10-CM | POA: Diagnosis not present

## 2020-05-30 DIAGNOSIS — M5417 Radiculopathy, lumbosacral region: Secondary | ICD-10-CM | POA: Diagnosis not present

## 2020-05-30 DIAGNOSIS — M48062 Spinal stenosis, lumbar region with neurogenic claudication: Secondary | ICD-10-CM | POA: Diagnosis not present

## 2020-06-03 DIAGNOSIS — M48062 Spinal stenosis, lumbar region with neurogenic claudication: Secondary | ICD-10-CM | POA: Diagnosis not present

## 2020-06-03 DIAGNOSIS — M5417 Radiculopathy, lumbosacral region: Secondary | ICD-10-CM | POA: Diagnosis not present

## 2020-06-06 DIAGNOSIS — M5417 Radiculopathy, lumbosacral region: Secondary | ICD-10-CM | POA: Diagnosis not present

## 2020-06-06 DIAGNOSIS — M48062 Spinal stenosis, lumbar region with neurogenic claudication: Secondary | ICD-10-CM | POA: Diagnosis not present

## 2020-06-11 ENCOUNTER — Other Ambulatory Visit: Payer: Self-pay | Admitting: Cardiovascular Disease

## 2020-06-16 DIAGNOSIS — G1221 Amyotrophic lateral sclerosis: Secondary | ICD-10-CM | POA: Diagnosis not present

## 2020-06-16 DIAGNOSIS — Z5181 Encounter for therapeutic drug level monitoring: Secondary | ICD-10-CM | POA: Diagnosis not present

## 2020-06-17 DIAGNOSIS — M6281 Muscle weakness (generalized): Secondary | ICD-10-CM | POA: Diagnosis not present

## 2020-06-17 DIAGNOSIS — G1221 Amyotrophic lateral sclerosis: Secondary | ICD-10-CM | POA: Diagnosis not present

## 2020-06-17 DIAGNOSIS — R262 Difficulty in walking, not elsewhere classified: Secondary | ICD-10-CM | POA: Diagnosis not present

## 2020-06-17 DIAGNOSIS — Z79899 Other long term (current) drug therapy: Secondary | ICD-10-CM | POA: Diagnosis not present

## 2020-06-17 DIAGNOSIS — Z789 Other specified health status: Secondary | ICD-10-CM | POA: Diagnosis not present

## 2020-06-17 DIAGNOSIS — R471 Dysarthria and anarthria: Secondary | ICD-10-CM | POA: Diagnosis not present

## 2020-06-17 DIAGNOSIS — Z7901 Long term (current) use of anticoagulants: Secondary | ICD-10-CM | POA: Diagnosis not present

## 2020-06-24 DIAGNOSIS — M5417 Radiculopathy, lumbosacral region: Secondary | ICD-10-CM | POA: Diagnosis not present

## 2020-06-24 DIAGNOSIS — M48062 Spinal stenosis, lumbar region with neurogenic claudication: Secondary | ICD-10-CM | POA: Diagnosis not present

## 2020-06-26 DIAGNOSIS — I10 Essential (primary) hypertension: Secondary | ICD-10-CM | POA: Diagnosis not present

## 2020-06-26 DIAGNOSIS — Z8673 Personal history of transient ischemic attack (TIA), and cerebral infarction without residual deficits: Secondary | ICD-10-CM | POA: Diagnosis not present

## 2020-06-26 DIAGNOSIS — R809 Proteinuria, unspecified: Secondary | ICD-10-CM | POA: Diagnosis not present

## 2020-06-26 DIAGNOSIS — I4891 Unspecified atrial fibrillation: Secondary | ICD-10-CM | POA: Diagnosis not present

## 2020-06-26 DIAGNOSIS — Z6831 Body mass index (BMI) 31.0-31.9, adult: Secondary | ICD-10-CM | POA: Diagnosis not present

## 2020-06-26 DIAGNOSIS — E119 Type 2 diabetes mellitus without complications: Secondary | ICD-10-CM | POA: Diagnosis not present

## 2020-06-26 DIAGNOSIS — E785 Hyperlipidemia, unspecified: Secondary | ICD-10-CM | POA: Diagnosis not present

## 2020-07-01 DIAGNOSIS — M5417 Radiculopathy, lumbosacral region: Secondary | ICD-10-CM | POA: Diagnosis not present

## 2020-07-01 DIAGNOSIS — M48062 Spinal stenosis, lumbar region with neurogenic claudication: Secondary | ICD-10-CM | POA: Diagnosis not present

## 2020-07-02 ENCOUNTER — Ambulatory Visit: Payer: Medicare Other | Admitting: Podiatry

## 2020-07-08 DIAGNOSIS — E1169 Type 2 diabetes mellitus with other specified complication: Secondary | ICD-10-CM | POA: Diagnosis not present

## 2020-07-08 DIAGNOSIS — I10 Essential (primary) hypertension: Secondary | ICD-10-CM | POA: Diagnosis not present

## 2020-07-08 DIAGNOSIS — M47816 Spondylosis without myelopathy or radiculopathy, lumbar region: Secondary | ICD-10-CM | POA: Diagnosis not present

## 2020-07-08 DIAGNOSIS — I480 Paroxysmal atrial fibrillation: Secondary | ICD-10-CM | POA: Diagnosis not present

## 2020-07-08 DIAGNOSIS — K219 Gastro-esophageal reflux disease without esophagitis: Secondary | ICD-10-CM | POA: Diagnosis not present

## 2020-07-08 DIAGNOSIS — E785 Hyperlipidemia, unspecified: Secondary | ICD-10-CM | POA: Diagnosis not present

## 2020-07-10 DIAGNOSIS — M5417 Radiculopathy, lumbosacral region: Secondary | ICD-10-CM | POA: Diagnosis not present

## 2020-07-10 DIAGNOSIS — M48062 Spinal stenosis, lumbar region with neurogenic claudication: Secondary | ICD-10-CM | POA: Diagnosis not present

## 2020-07-14 DIAGNOSIS — R350 Frequency of micturition: Secondary | ICD-10-CM | POA: Diagnosis not present

## 2020-07-14 DIAGNOSIS — Z5181 Encounter for therapeutic drug level monitoring: Secondary | ICD-10-CM | POA: Diagnosis not present

## 2020-07-14 DIAGNOSIS — G1221 Amyotrophic lateral sclerosis: Secondary | ICD-10-CM | POA: Diagnosis not present

## 2020-07-15 ENCOUNTER — Ambulatory Visit (INDEPENDENT_AMBULATORY_CARE_PROVIDER_SITE_OTHER): Payer: Medicare Other | Admitting: Podiatry

## 2020-07-15 ENCOUNTER — Encounter: Payer: Self-pay | Admitting: Podiatry

## 2020-07-15 ENCOUNTER — Other Ambulatory Visit: Payer: Self-pay

## 2020-07-15 DIAGNOSIS — B351 Tinea unguium: Secondary | ICD-10-CM

## 2020-07-15 DIAGNOSIS — D689 Coagulation defect, unspecified: Secondary | ICD-10-CM | POA: Diagnosis not present

## 2020-07-15 DIAGNOSIS — E1151 Type 2 diabetes mellitus with diabetic peripheral angiopathy without gangrene: Secondary | ICD-10-CM | POA: Diagnosis not present

## 2020-07-15 DIAGNOSIS — E1169 Type 2 diabetes mellitus with other specified complication: Secondary | ICD-10-CM

## 2020-07-15 NOTE — Progress Notes (Signed)
This patient returns to my office for at risk foot care.  This patient requires this care by a professional since this patient will be at risk due to having coagulation defect due to eliquis.  This patient is unable to cut nails himself since the patient cannot reach his nails.These nails are painful walking and wearing shoes.  This patient presents for at risk foot care today.  General Appearance  Alert, conversant and in no acute stress.  Vascular  Dorsalis pedis and posterior tibial  pulses are palpable  bilaterally.  Capillary return is within normal limits  bilaterally. Temperature is within normal limits  bilaterally.  Neurologic  Senn-Weinstein monofilament wire test within normal limits  bilaterally. Muscle power within normal limits bilaterally.  Nails Thick disfigured discolored nails with subungual debris  from hallux to fifth toes bilaterally. No evidence of bacterial infection or drainage bilaterally.  Orthopedic  No limitations of motion  feet .  No crepitus or effusions noted.  No bony pathology or digital deformities noted  Foot drop right foot.  Skin  normotropic skin with no porokeratosis noted bilaterally.  No signs of infections or ulcers noted.     Onychomycosis  Pain in right toes  Pain in left toes  Consent was obtained for treatment procedures.   Mechanical debridement of nails 1-5  bilaterally performed with a nail nipper.  Filed with dremel without incident.    Return office visit   3 months                  Told patient to return for periodic foot care and evaluation due to potential at risk complications.   Helane Gunther DPM

## 2020-07-17 DIAGNOSIS — M48062 Spinal stenosis, lumbar region with neurogenic claudication: Secondary | ICD-10-CM | POA: Diagnosis not present

## 2020-07-17 DIAGNOSIS — M5417 Radiculopathy, lumbosacral region: Secondary | ICD-10-CM | POA: Diagnosis not present

## 2020-07-22 DIAGNOSIS — M5417 Radiculopathy, lumbosacral region: Secondary | ICD-10-CM | POA: Diagnosis not present

## 2020-07-22 DIAGNOSIS — M48062 Spinal stenosis, lumbar region with neurogenic claudication: Secondary | ICD-10-CM | POA: Diagnosis not present

## 2020-07-29 ENCOUNTER — Other Ambulatory Visit: Payer: Self-pay | Admitting: Cardiovascular Disease

## 2020-07-29 DIAGNOSIS — M48062 Spinal stenosis, lumbar region with neurogenic claudication: Secondary | ICD-10-CM | POA: Diagnosis not present

## 2020-07-29 DIAGNOSIS — M5417 Radiculopathy, lumbosacral region: Secondary | ICD-10-CM | POA: Diagnosis not present

## 2020-07-29 NOTE — Telephone Encounter (Signed)
Eliquis 5mg  refill request received. Patient is 70 years old, weight-84.8kg, Crea-0.80 on 08/23/2019 via [facility] from [place], Diagnosis-Afib and CVA, and last seen by Vin, PA on 05/27/2020. Dose is appropriate based on dosing criteria. Will send in refill to requested pharmacy.

## 2020-08-05 DIAGNOSIS — M48062 Spinal stenosis, lumbar region with neurogenic claudication: Secondary | ICD-10-CM | POA: Diagnosis not present

## 2020-08-05 DIAGNOSIS — M5417 Radiculopathy, lumbosacral region: Secondary | ICD-10-CM | POA: Diagnosis not present

## 2020-08-08 DIAGNOSIS — I10 Essential (primary) hypertension: Secondary | ICD-10-CM | POA: Diagnosis not present

## 2020-08-08 DIAGNOSIS — E1169 Type 2 diabetes mellitus with other specified complication: Secondary | ICD-10-CM | POA: Diagnosis not present

## 2020-08-08 DIAGNOSIS — I480 Paroxysmal atrial fibrillation: Secondary | ICD-10-CM | POA: Diagnosis not present

## 2020-08-08 DIAGNOSIS — M47816 Spondylosis without myelopathy or radiculopathy, lumbar region: Secondary | ICD-10-CM | POA: Diagnosis not present

## 2020-08-08 DIAGNOSIS — K219 Gastro-esophageal reflux disease without esophagitis: Secondary | ICD-10-CM | POA: Diagnosis not present

## 2020-08-08 DIAGNOSIS — E785 Hyperlipidemia, unspecified: Secondary | ICD-10-CM | POA: Diagnosis not present

## 2020-08-14 DIAGNOSIS — M48062 Spinal stenosis, lumbar region with neurogenic claudication: Secondary | ICD-10-CM | POA: Diagnosis not present

## 2020-08-14 DIAGNOSIS — M5417 Radiculopathy, lumbosacral region: Secondary | ICD-10-CM | POA: Diagnosis not present

## 2020-08-19 DIAGNOSIS — M5417 Radiculopathy, lumbosacral region: Secondary | ICD-10-CM | POA: Diagnosis not present

## 2020-08-19 DIAGNOSIS — M48062 Spinal stenosis, lumbar region with neurogenic claudication: Secondary | ICD-10-CM | POA: Diagnosis not present

## 2020-08-26 DIAGNOSIS — M5417 Radiculopathy, lumbosacral region: Secondary | ICD-10-CM | POA: Diagnosis not present

## 2020-08-26 DIAGNOSIS — M48062 Spinal stenosis, lumbar region with neurogenic claudication: Secondary | ICD-10-CM | POA: Diagnosis not present

## 2020-08-29 DIAGNOSIS — E1169 Type 2 diabetes mellitus with other specified complication: Secondary | ICD-10-CM | POA: Diagnosis not present

## 2020-08-29 DIAGNOSIS — M47816 Spondylosis without myelopathy or radiculopathy, lumbar region: Secondary | ICD-10-CM | POA: Diagnosis not present

## 2020-08-29 DIAGNOSIS — K219 Gastro-esophageal reflux disease without esophagitis: Secondary | ICD-10-CM | POA: Diagnosis not present

## 2020-08-29 DIAGNOSIS — I10 Essential (primary) hypertension: Secondary | ICD-10-CM | POA: Diagnosis not present

## 2020-08-29 DIAGNOSIS — I480 Paroxysmal atrial fibrillation: Secondary | ICD-10-CM | POA: Diagnosis not present

## 2020-08-29 DIAGNOSIS — E785 Hyperlipidemia, unspecified: Secondary | ICD-10-CM | POA: Diagnosis not present

## 2020-09-11 DIAGNOSIS — Z0000 Encounter for general adult medical examination without abnormal findings: Secondary | ICD-10-CM | POA: Diagnosis not present

## 2020-09-11 DIAGNOSIS — I10 Essential (primary) hypertension: Secondary | ICD-10-CM | POA: Diagnosis not present

## 2020-09-11 DIAGNOSIS — Z125 Encounter for screening for malignant neoplasm of prostate: Secondary | ICD-10-CM | POA: Diagnosis not present

## 2020-09-11 DIAGNOSIS — Z794 Long term (current) use of insulin: Secondary | ICD-10-CM | POA: Diagnosis not present

## 2020-09-11 DIAGNOSIS — I480 Paroxysmal atrial fibrillation: Secondary | ICD-10-CM | POA: Diagnosis not present

## 2020-09-11 DIAGNOSIS — G1220 Motor neuron disease, unspecified: Secondary | ICD-10-CM | POA: Diagnosis not present

## 2020-09-11 DIAGNOSIS — R809 Proteinuria, unspecified: Secondary | ICD-10-CM | POA: Diagnosis not present

## 2020-09-11 DIAGNOSIS — E785 Hyperlipidemia, unspecified: Secondary | ICD-10-CM | POA: Diagnosis not present

## 2020-09-11 DIAGNOSIS — M21371 Foot drop, right foot: Secondary | ICD-10-CM | POA: Diagnosis not present

## 2020-09-11 DIAGNOSIS — M48062 Spinal stenosis, lumbar region with neurogenic claudication: Secondary | ICD-10-CM | POA: Diagnosis not present

## 2020-09-11 DIAGNOSIS — I700 Atherosclerosis of aorta: Secondary | ICD-10-CM | POA: Diagnosis not present

## 2020-09-11 DIAGNOSIS — D6869 Other thrombophilia: Secondary | ICD-10-CM | POA: Diagnosis not present

## 2020-09-11 DIAGNOSIS — M5417 Radiculopathy, lumbosacral region: Secondary | ICD-10-CM | POA: Diagnosis not present

## 2020-09-11 DIAGNOSIS — E1169 Type 2 diabetes mellitus with other specified complication: Secondary | ICD-10-CM | POA: Diagnosis not present

## 2020-09-23 DIAGNOSIS — G1221 Amyotrophic lateral sclerosis: Secondary | ICD-10-CM | POA: Diagnosis not present

## 2020-09-23 DIAGNOSIS — M545 Low back pain, unspecified: Secondary | ICD-10-CM | POA: Diagnosis not present

## 2020-09-23 DIAGNOSIS — Z5181 Encounter for therapeutic drug level monitoring: Secondary | ICD-10-CM | POA: Diagnosis not present

## 2020-09-23 DIAGNOSIS — R471 Dysarthria and anarthria: Secondary | ICD-10-CM | POA: Diagnosis not present

## 2020-09-23 DIAGNOSIS — Z79899 Other long term (current) drug therapy: Secondary | ICD-10-CM | POA: Diagnosis not present

## 2020-09-23 DIAGNOSIS — G8929 Other chronic pain: Secondary | ICD-10-CM | POA: Diagnosis not present

## 2020-09-23 DIAGNOSIS — R262 Difficulty in walking, not elsewhere classified: Secondary | ICD-10-CM | POA: Diagnosis not present

## 2020-09-23 DIAGNOSIS — M6281 Muscle weakness (generalized): Secondary | ICD-10-CM | POA: Diagnosis not present

## 2020-09-23 DIAGNOSIS — Z789 Other specified health status: Secondary | ICD-10-CM | POA: Diagnosis not present

## 2020-10-08 DIAGNOSIS — M47816 Spondylosis without myelopathy or radiculopathy, lumbar region: Secondary | ICD-10-CM | POA: Diagnosis not present

## 2020-10-08 DIAGNOSIS — J029 Acute pharyngitis, unspecified: Secondary | ICD-10-CM | POA: Diagnosis not present

## 2020-10-08 DIAGNOSIS — Z20822 Contact with and (suspected) exposure to covid-19: Secondary | ICD-10-CM | POA: Diagnosis not present

## 2020-10-08 DIAGNOSIS — U071 COVID-19: Secondary | ICD-10-CM | POA: Diagnosis not present

## 2020-10-08 DIAGNOSIS — I10 Essential (primary) hypertension: Secondary | ICD-10-CM | POA: Diagnosis not present

## 2020-10-08 DIAGNOSIS — Z7984 Long term (current) use of oral hypoglycemic drugs: Secondary | ICD-10-CM | POA: Diagnosis not present

## 2020-10-08 DIAGNOSIS — I480 Paroxysmal atrial fibrillation: Secondary | ICD-10-CM | POA: Diagnosis not present

## 2020-10-08 DIAGNOSIS — E785 Hyperlipidemia, unspecified: Secondary | ICD-10-CM | POA: Diagnosis not present

## 2020-10-08 DIAGNOSIS — R42 Dizziness and giddiness: Secondary | ICD-10-CM | POA: Diagnosis not present

## 2020-10-08 DIAGNOSIS — K219 Gastro-esophageal reflux disease without esophagitis: Secondary | ICD-10-CM | POA: Diagnosis not present

## 2020-10-08 DIAGNOSIS — D6869 Other thrombophilia: Secondary | ICD-10-CM | POA: Diagnosis not present

## 2020-10-08 DIAGNOSIS — R0989 Other specified symptoms and signs involving the circulatory and respiratory systems: Secondary | ICD-10-CM | POA: Diagnosis not present

## 2020-10-08 DIAGNOSIS — E1169 Type 2 diabetes mellitus with other specified complication: Secondary | ICD-10-CM | POA: Diagnosis not present

## 2020-10-15 ENCOUNTER — Ambulatory Visit: Payer: Medicare Other | Admitting: Podiatry

## 2020-10-17 DIAGNOSIS — M6281 Muscle weakness (generalized): Secondary | ICD-10-CM | POA: Diagnosis not present

## 2020-10-17 DIAGNOSIS — M545 Low back pain, unspecified: Secondary | ICD-10-CM | POA: Diagnosis not present

## 2020-10-17 DIAGNOSIS — M48062 Spinal stenosis, lumbar region with neurogenic claudication: Secondary | ICD-10-CM | POA: Diagnosis not present

## 2020-10-17 DIAGNOSIS — R262 Difficulty in walking, not elsewhere classified: Secondary | ICD-10-CM | POA: Diagnosis not present

## 2020-10-21 DIAGNOSIS — M48062 Spinal stenosis, lumbar region with neurogenic claudication: Secondary | ICD-10-CM | POA: Diagnosis not present

## 2020-10-21 DIAGNOSIS — M6281 Muscle weakness (generalized): Secondary | ICD-10-CM | POA: Diagnosis not present

## 2020-10-21 DIAGNOSIS — M545 Low back pain, unspecified: Secondary | ICD-10-CM | POA: Diagnosis not present

## 2020-10-21 DIAGNOSIS — R262 Difficulty in walking, not elsewhere classified: Secondary | ICD-10-CM | POA: Diagnosis not present

## 2020-10-28 ENCOUNTER — Ambulatory Visit (INDEPENDENT_AMBULATORY_CARE_PROVIDER_SITE_OTHER): Payer: Medicare Other | Admitting: Podiatry

## 2020-10-28 ENCOUNTER — Other Ambulatory Visit: Payer: Self-pay

## 2020-10-28 ENCOUNTER — Encounter: Payer: Self-pay | Admitting: Podiatry

## 2020-10-28 DIAGNOSIS — D689 Coagulation defect, unspecified: Secondary | ICD-10-CM

## 2020-10-28 DIAGNOSIS — E1169 Type 2 diabetes mellitus with other specified complication: Secondary | ICD-10-CM | POA: Diagnosis not present

## 2020-10-28 DIAGNOSIS — B351 Tinea unguium: Secondary | ICD-10-CM | POA: Diagnosis not present

## 2020-10-28 DIAGNOSIS — E1151 Type 2 diabetes mellitus with diabetic peripheral angiopathy without gangrene: Secondary | ICD-10-CM

## 2020-10-28 NOTE — Progress Notes (Signed)
This patient returns to my office for at risk foot care.  This patient requires this care by a professional since this patient will be at risk due to having coagulation defect due to eliquis.  This patient is unable to cut nails himself since the patient cannot reach his nails.These nails are painful walking and wearing shoes.  This patient presents for at risk foot care today.  General Appearance  Alert, conversant and in no acute stress.  Vascular  Dorsalis pedis and posterior tibial  pulses are palpable  bilaterally.  Capillary return is within normal limits  bilaterally. Temperature is within normal limits  bilaterally.  Neurologic  Senn-Weinstein monofilament wire test within normal limits  bilaterally. Muscle power within normal limits bilaterally.  Nails Thick disfigured discolored nails with subungual debris  from hallux to fifth toes bilaterally. No evidence of bacterial infection or drainage bilaterally.  Orthopedic  No limitations of motion  feet .  No crepitus or effusions noted.  No bony pathology or digital deformities noted  Foot drop right foot.  Skin  normotropic skin with no porokeratosis noted bilaterally.  No signs of infections or ulcers noted.     Onychomycosis  Pain in right toes  Pain in left toes  Consent was obtained for treatment procedures.   Mechanical debridement of nails 1-5  bilaterally performed with a nail nipper.  Filed with dremel without incident.    Return office visit   3 months                  Told patient to return for periodic foot care and evaluation due to potential at risk complications.   Helane Gunther DPM

## 2020-10-29 DIAGNOSIS — M6281 Muscle weakness (generalized): Secondary | ICD-10-CM | POA: Diagnosis not present

## 2020-10-29 DIAGNOSIS — M48062 Spinal stenosis, lumbar region with neurogenic claudication: Secondary | ICD-10-CM | POA: Diagnosis not present

## 2020-10-29 DIAGNOSIS — M545 Low back pain, unspecified: Secondary | ICD-10-CM | POA: Diagnosis not present

## 2020-10-29 DIAGNOSIS — R262 Difficulty in walking, not elsewhere classified: Secondary | ICD-10-CM | POA: Diagnosis not present

## 2020-10-30 DIAGNOSIS — E785 Hyperlipidemia, unspecified: Secondary | ICD-10-CM | POA: Diagnosis not present

## 2020-10-30 DIAGNOSIS — I10 Essential (primary) hypertension: Secondary | ICD-10-CM | POA: Diagnosis not present

## 2020-10-30 DIAGNOSIS — Z8673 Personal history of transient ischemic attack (TIA), and cerebral infarction without residual deficits: Secondary | ICD-10-CM | POA: Diagnosis not present

## 2020-10-30 DIAGNOSIS — Z6831 Body mass index (BMI) 31.0-31.9, adult: Secondary | ICD-10-CM | POA: Diagnosis not present

## 2020-10-30 DIAGNOSIS — R809 Proteinuria, unspecified: Secondary | ICD-10-CM | POA: Diagnosis not present

## 2020-10-30 DIAGNOSIS — E119 Type 2 diabetes mellitus without complications: Secondary | ICD-10-CM | POA: Diagnosis not present

## 2020-10-30 DIAGNOSIS — I4891 Unspecified atrial fibrillation: Secondary | ICD-10-CM | POA: Diagnosis not present

## 2020-10-31 DIAGNOSIS — M48062 Spinal stenosis, lumbar region with neurogenic claudication: Secondary | ICD-10-CM | POA: Diagnosis not present

## 2020-10-31 DIAGNOSIS — M545 Low back pain, unspecified: Secondary | ICD-10-CM | POA: Diagnosis not present

## 2020-10-31 DIAGNOSIS — R262 Difficulty in walking, not elsewhere classified: Secondary | ICD-10-CM | POA: Diagnosis not present

## 2020-10-31 DIAGNOSIS — M6281 Muscle weakness (generalized): Secondary | ICD-10-CM | POA: Diagnosis not present

## 2020-11-05 DIAGNOSIS — I10 Essential (primary) hypertension: Secondary | ICD-10-CM | POA: Diagnosis not present

## 2020-11-05 DIAGNOSIS — I480 Paroxysmal atrial fibrillation: Secondary | ICD-10-CM | POA: Diagnosis not present

## 2020-11-05 DIAGNOSIS — M47816 Spondylosis without myelopathy or radiculopathy, lumbar region: Secondary | ICD-10-CM | POA: Diagnosis not present

## 2020-11-05 DIAGNOSIS — K219 Gastro-esophageal reflux disease without esophagitis: Secondary | ICD-10-CM | POA: Diagnosis not present

## 2020-11-05 DIAGNOSIS — E1169 Type 2 diabetes mellitus with other specified complication: Secondary | ICD-10-CM | POA: Diagnosis not present

## 2020-11-05 DIAGNOSIS — E785 Hyperlipidemia, unspecified: Secondary | ICD-10-CM | POA: Diagnosis not present

## 2020-11-07 DIAGNOSIS — R262 Difficulty in walking, not elsewhere classified: Secondary | ICD-10-CM | POA: Diagnosis not present

## 2020-11-07 DIAGNOSIS — M545 Low back pain, unspecified: Secondary | ICD-10-CM | POA: Diagnosis not present

## 2020-11-07 DIAGNOSIS — M48062 Spinal stenosis, lumbar region with neurogenic claudication: Secondary | ICD-10-CM | POA: Diagnosis not present

## 2020-11-07 DIAGNOSIS — M6281 Muscle weakness (generalized): Secondary | ICD-10-CM | POA: Diagnosis not present

## 2020-11-13 DIAGNOSIS — G894 Chronic pain syndrome: Secondary | ICD-10-CM | POA: Diagnosis not present

## 2020-11-13 DIAGNOSIS — G1221 Amyotrophic lateral sclerosis: Secondary | ICD-10-CM | POA: Diagnosis not present

## 2020-11-17 DIAGNOSIS — I480 Paroxysmal atrial fibrillation: Secondary | ICD-10-CM | POA: Diagnosis not present

## 2020-11-17 DIAGNOSIS — I10 Essential (primary) hypertension: Secondary | ICD-10-CM | POA: Diagnosis not present

## 2020-11-17 DIAGNOSIS — K219 Gastro-esophageal reflux disease without esophagitis: Secondary | ICD-10-CM | POA: Diagnosis not present

## 2020-11-17 DIAGNOSIS — E785 Hyperlipidemia, unspecified: Secondary | ICD-10-CM | POA: Diagnosis not present

## 2020-11-17 DIAGNOSIS — E1169 Type 2 diabetes mellitus with other specified complication: Secondary | ICD-10-CM | POA: Diagnosis not present

## 2020-11-17 DIAGNOSIS — M47816 Spondylosis without myelopathy or radiculopathy, lumbar region: Secondary | ICD-10-CM | POA: Diagnosis not present

## 2020-11-18 DIAGNOSIS — M545 Low back pain, unspecified: Secondary | ICD-10-CM | POA: Diagnosis not present

## 2020-11-18 DIAGNOSIS — R262 Difficulty in walking, not elsewhere classified: Secondary | ICD-10-CM | POA: Diagnosis not present

## 2020-11-18 DIAGNOSIS — M6281 Muscle weakness (generalized): Secondary | ICD-10-CM | POA: Diagnosis not present

## 2020-11-18 DIAGNOSIS — M48062 Spinal stenosis, lumbar region with neurogenic claudication: Secondary | ICD-10-CM | POA: Diagnosis not present

## 2020-11-21 DIAGNOSIS — M545 Low back pain, unspecified: Secondary | ICD-10-CM | POA: Diagnosis not present

## 2020-11-21 DIAGNOSIS — R262 Difficulty in walking, not elsewhere classified: Secondary | ICD-10-CM | POA: Diagnosis not present

## 2020-11-21 DIAGNOSIS — M6281 Muscle weakness (generalized): Secondary | ICD-10-CM | POA: Diagnosis not present

## 2020-11-21 DIAGNOSIS — M48062 Spinal stenosis, lumbar region with neurogenic claudication: Secondary | ICD-10-CM | POA: Diagnosis not present

## 2020-11-25 DIAGNOSIS — M545 Low back pain, unspecified: Secondary | ICD-10-CM | POA: Diagnosis not present

## 2020-11-25 DIAGNOSIS — M6281 Muscle weakness (generalized): Secondary | ICD-10-CM | POA: Diagnosis not present

## 2020-11-25 DIAGNOSIS — M48062 Spinal stenosis, lumbar region with neurogenic claudication: Secondary | ICD-10-CM | POA: Diagnosis not present

## 2020-11-25 DIAGNOSIS — R262 Difficulty in walking, not elsewhere classified: Secondary | ICD-10-CM | POA: Diagnosis not present

## 2020-12-02 DIAGNOSIS — M6281 Muscle weakness (generalized): Secondary | ICD-10-CM | POA: Diagnosis not present

## 2020-12-02 DIAGNOSIS — R262 Difficulty in walking, not elsewhere classified: Secondary | ICD-10-CM | POA: Diagnosis not present

## 2020-12-02 DIAGNOSIS — M545 Low back pain, unspecified: Secondary | ICD-10-CM | POA: Diagnosis not present

## 2020-12-02 DIAGNOSIS — M48062 Spinal stenosis, lumbar region with neurogenic claudication: Secondary | ICD-10-CM | POA: Diagnosis not present

## 2020-12-05 DIAGNOSIS — M6281 Muscle weakness (generalized): Secondary | ICD-10-CM | POA: Diagnosis not present

## 2020-12-05 DIAGNOSIS — M48062 Spinal stenosis, lumbar region with neurogenic claudication: Secondary | ICD-10-CM | POA: Diagnosis not present

## 2020-12-05 DIAGNOSIS — R262 Difficulty in walking, not elsewhere classified: Secondary | ICD-10-CM | POA: Diagnosis not present

## 2020-12-05 DIAGNOSIS — M545 Low back pain, unspecified: Secondary | ICD-10-CM | POA: Diagnosis not present

## 2020-12-09 DIAGNOSIS — M48062 Spinal stenosis, lumbar region with neurogenic claudication: Secondary | ICD-10-CM | POA: Diagnosis not present

## 2020-12-09 DIAGNOSIS — M6281 Muscle weakness (generalized): Secondary | ICD-10-CM | POA: Diagnosis not present

## 2020-12-09 DIAGNOSIS — M545 Low back pain, unspecified: Secondary | ICD-10-CM | POA: Diagnosis not present

## 2020-12-09 DIAGNOSIS — R262 Difficulty in walking, not elsewhere classified: Secondary | ICD-10-CM | POA: Diagnosis not present

## 2020-12-12 DIAGNOSIS — M545 Low back pain, unspecified: Secondary | ICD-10-CM | POA: Diagnosis not present

## 2020-12-12 DIAGNOSIS — M48062 Spinal stenosis, lumbar region with neurogenic claudication: Secondary | ICD-10-CM | POA: Diagnosis not present

## 2020-12-12 DIAGNOSIS — R262 Difficulty in walking, not elsewhere classified: Secondary | ICD-10-CM | POA: Diagnosis not present

## 2020-12-12 DIAGNOSIS — M6281 Muscle weakness (generalized): Secondary | ICD-10-CM | POA: Diagnosis not present

## 2020-12-16 DIAGNOSIS — R262 Difficulty in walking, not elsewhere classified: Secondary | ICD-10-CM | POA: Diagnosis not present

## 2020-12-16 DIAGNOSIS — M545 Low back pain, unspecified: Secondary | ICD-10-CM | POA: Diagnosis not present

## 2020-12-16 DIAGNOSIS — M6281 Muscle weakness (generalized): Secondary | ICD-10-CM | POA: Diagnosis not present

## 2020-12-16 DIAGNOSIS — M48062 Spinal stenosis, lumbar region with neurogenic claudication: Secondary | ICD-10-CM | POA: Diagnosis not present

## 2020-12-19 DIAGNOSIS — M545 Low back pain, unspecified: Secondary | ICD-10-CM | POA: Diagnosis not present

## 2020-12-19 DIAGNOSIS — M48062 Spinal stenosis, lumbar region with neurogenic claudication: Secondary | ICD-10-CM | POA: Diagnosis not present

## 2020-12-19 DIAGNOSIS — M6281 Muscle weakness (generalized): Secondary | ICD-10-CM | POA: Diagnosis not present

## 2020-12-19 DIAGNOSIS — R262 Difficulty in walking, not elsewhere classified: Secondary | ICD-10-CM | POA: Diagnosis not present

## 2020-12-23 DIAGNOSIS — M6281 Muscle weakness (generalized): Secondary | ICD-10-CM | POA: Diagnosis not present

## 2020-12-23 DIAGNOSIS — R262 Difficulty in walking, not elsewhere classified: Secondary | ICD-10-CM | POA: Diagnosis not present

## 2020-12-23 DIAGNOSIS — M48062 Spinal stenosis, lumbar region with neurogenic claudication: Secondary | ICD-10-CM | POA: Diagnosis not present

## 2020-12-23 DIAGNOSIS — M545 Low back pain, unspecified: Secondary | ICD-10-CM | POA: Diagnosis not present

## 2020-12-26 DIAGNOSIS — M48062 Spinal stenosis, lumbar region with neurogenic claudication: Secondary | ICD-10-CM | POA: Diagnosis not present

## 2020-12-26 DIAGNOSIS — M6281 Muscle weakness (generalized): Secondary | ICD-10-CM | POA: Diagnosis not present

## 2020-12-26 DIAGNOSIS — R262 Difficulty in walking, not elsewhere classified: Secondary | ICD-10-CM | POA: Diagnosis not present

## 2020-12-26 DIAGNOSIS — M545 Low back pain, unspecified: Secondary | ICD-10-CM | POA: Diagnosis not present

## 2020-12-30 DIAGNOSIS — M6281 Muscle weakness (generalized): Secondary | ICD-10-CM | POA: Diagnosis not present

## 2020-12-30 DIAGNOSIS — R471 Dysarthria and anarthria: Secondary | ICD-10-CM | POA: Diagnosis not present

## 2020-12-30 DIAGNOSIS — R262 Difficulty in walking, not elsewhere classified: Secondary | ICD-10-CM | POA: Diagnosis not present

## 2020-12-30 DIAGNOSIS — Z79899 Other long term (current) drug therapy: Secondary | ICD-10-CM | POA: Diagnosis not present

## 2020-12-30 DIAGNOSIS — Z5181 Encounter for therapeutic drug level monitoring: Secondary | ICD-10-CM | POA: Diagnosis not present

## 2020-12-30 DIAGNOSIS — G1221 Amyotrophic lateral sclerosis: Secondary | ICD-10-CM | POA: Diagnosis not present

## 2021-01-06 DIAGNOSIS — M47816 Spondylosis without myelopathy or radiculopathy, lumbar region: Secondary | ICD-10-CM | POA: Diagnosis not present

## 2021-01-06 DIAGNOSIS — E785 Hyperlipidemia, unspecified: Secondary | ICD-10-CM | POA: Diagnosis not present

## 2021-01-06 DIAGNOSIS — I480 Paroxysmal atrial fibrillation: Secondary | ICD-10-CM | POA: Diagnosis not present

## 2021-01-06 DIAGNOSIS — K219 Gastro-esophageal reflux disease without esophagitis: Secondary | ICD-10-CM | POA: Diagnosis not present

## 2021-01-06 DIAGNOSIS — E1169 Type 2 diabetes mellitus with other specified complication: Secondary | ICD-10-CM | POA: Diagnosis not present

## 2021-01-06 DIAGNOSIS — I10 Essential (primary) hypertension: Secondary | ICD-10-CM | POA: Diagnosis not present

## 2021-01-14 DIAGNOSIS — M6281 Muscle weakness (generalized): Secondary | ICD-10-CM | POA: Diagnosis not present

## 2021-01-14 DIAGNOSIS — R262 Difficulty in walking, not elsewhere classified: Secondary | ICD-10-CM | POA: Diagnosis not present

## 2021-01-14 DIAGNOSIS — M545 Low back pain, unspecified: Secondary | ICD-10-CM | POA: Diagnosis not present

## 2021-01-14 DIAGNOSIS — M48062 Spinal stenosis, lumbar region with neurogenic claudication: Secondary | ICD-10-CM | POA: Diagnosis not present

## 2021-01-30 DIAGNOSIS — M6281 Muscle weakness (generalized): Secondary | ICD-10-CM | POA: Diagnosis not present

## 2021-01-30 DIAGNOSIS — M48062 Spinal stenosis, lumbar region with neurogenic claudication: Secondary | ICD-10-CM | POA: Diagnosis not present

## 2021-01-30 DIAGNOSIS — R262 Difficulty in walking, not elsewhere classified: Secondary | ICD-10-CM | POA: Diagnosis not present

## 2021-01-30 DIAGNOSIS — M545 Low back pain, unspecified: Secondary | ICD-10-CM | POA: Diagnosis not present

## 2021-02-02 ENCOUNTER — Other Ambulatory Visit: Payer: Self-pay

## 2021-02-02 ENCOUNTER — Encounter: Payer: Self-pay | Admitting: Podiatry

## 2021-02-02 ENCOUNTER — Ambulatory Visit (INDEPENDENT_AMBULATORY_CARE_PROVIDER_SITE_OTHER): Payer: Medicare Other | Admitting: Podiatry

## 2021-02-02 DIAGNOSIS — E1169 Type 2 diabetes mellitus with other specified complication: Secondary | ICD-10-CM | POA: Diagnosis not present

## 2021-02-02 DIAGNOSIS — D689 Coagulation defect, unspecified: Secondary | ICD-10-CM

## 2021-02-02 DIAGNOSIS — B351 Tinea unguium: Secondary | ICD-10-CM

## 2021-02-02 DIAGNOSIS — E1151 Type 2 diabetes mellitus with diabetic peripheral angiopathy without gangrene: Secondary | ICD-10-CM

## 2021-02-02 NOTE — Progress Notes (Signed)
This patient returns to my office for at risk foot care.  This patient requires this care by a professional since this patient will be at risk due to having coagulation defect due to eliquis.  This patient is unable to cut nails himself since the patient cannot reach his nails.These nails are painful walking and wearing shoes.  This patient presents for at risk foot care today.  General Appearance  Alert, conversant and in no acute stress.  Vascular  Dorsalis pedis and posterior tibial  pulses are palpable  bilaterally.  Capillary return is within normal limits  bilaterally. Temperature is within normal limits  bilaterally.  Neurologic  Senn-Weinstein monofilament wire test within normal limits  bilaterally. Muscle power within normal limits bilaterally.  Nails Thick disfigured discolored nails with subungual debris  from hallux to fifth toes bilaterally. No evidence of bacterial infection or drainage bilaterally.  Orthopedic  No limitations of motion  feet .  No crepitus or effusions noted.  No bony pathology or digital deformities noted  Foot drop right foot.  Skin  normotropic skin with no porokeratosis noted bilaterally.  No signs of infections or ulcers noted.     Onychomycosis  Pain in right toes  Pain in left toes  Consent was obtained for treatment procedures.   Mechanical debridement of nails 1-5  bilaterally performed with a nail nipper.  Filed with dremel without incident.    Return office visit   3 months                  Told patient to return for periodic foot care and evaluation due to potential at risk complications.   Fredis Malkiewicz DPM  

## 2021-02-06 DIAGNOSIS — R262 Difficulty in walking, not elsewhere classified: Secondary | ICD-10-CM | POA: Diagnosis not present

## 2021-02-06 DIAGNOSIS — M48062 Spinal stenosis, lumbar region with neurogenic claudication: Secondary | ICD-10-CM | POA: Diagnosis not present

## 2021-02-06 DIAGNOSIS — M545 Low back pain, unspecified: Secondary | ICD-10-CM | POA: Diagnosis not present

## 2021-02-06 DIAGNOSIS — M6281 Muscle weakness (generalized): Secondary | ICD-10-CM | POA: Diagnosis not present

## 2021-02-13 DIAGNOSIS — M545 Low back pain, unspecified: Secondary | ICD-10-CM | POA: Diagnosis not present

## 2021-02-13 DIAGNOSIS — R262 Difficulty in walking, not elsewhere classified: Secondary | ICD-10-CM | POA: Diagnosis not present

## 2021-02-13 DIAGNOSIS — M48062 Spinal stenosis, lumbar region with neurogenic claudication: Secondary | ICD-10-CM | POA: Diagnosis not present

## 2021-02-13 DIAGNOSIS — M6281 Muscle weakness (generalized): Secondary | ICD-10-CM | POA: Diagnosis not present

## 2021-02-23 DIAGNOSIS — Z7984 Long term (current) use of oral hypoglycemic drugs: Secondary | ICD-10-CM | POA: Diagnosis not present

## 2021-02-23 DIAGNOSIS — Z794 Long term (current) use of insulin: Secondary | ICD-10-CM | POA: Diagnosis not present

## 2021-02-23 DIAGNOSIS — E119 Type 2 diabetes mellitus without complications: Secondary | ICD-10-CM | POA: Diagnosis not present

## 2021-02-23 DIAGNOSIS — G1221 Amyotrophic lateral sclerosis: Secondary | ICD-10-CM | POA: Diagnosis not present

## 2021-02-23 DIAGNOSIS — I10 Essential (primary) hypertension: Secondary | ICD-10-CM | POA: Diagnosis not present

## 2021-02-23 DIAGNOSIS — Z7901 Long term (current) use of anticoagulants: Secondary | ICD-10-CM | POA: Diagnosis not present

## 2021-02-26 DIAGNOSIS — I10 Essential (primary) hypertension: Secondary | ICD-10-CM | POA: Diagnosis not present

## 2021-02-26 DIAGNOSIS — G1221 Amyotrophic lateral sclerosis: Secondary | ICD-10-CM | POA: Diagnosis not present

## 2021-02-26 DIAGNOSIS — Z794 Long term (current) use of insulin: Secondary | ICD-10-CM | POA: Diagnosis not present

## 2021-02-26 DIAGNOSIS — E119 Type 2 diabetes mellitus without complications: Secondary | ICD-10-CM | POA: Diagnosis not present

## 2021-02-26 DIAGNOSIS — Z7984 Long term (current) use of oral hypoglycemic drugs: Secondary | ICD-10-CM | POA: Diagnosis not present

## 2021-02-26 DIAGNOSIS — Z7901 Long term (current) use of anticoagulants: Secondary | ICD-10-CM | POA: Diagnosis not present

## 2021-02-27 ENCOUNTER — Encounter: Payer: Self-pay | Admitting: Neurology

## 2021-02-27 ENCOUNTER — Ambulatory Visit (INDEPENDENT_AMBULATORY_CARE_PROVIDER_SITE_OTHER): Payer: Medicare Other | Admitting: Neurology

## 2021-02-27 VITALS — BP 118/68 | HR 68 | Ht 65.0 in | Wt 167.0 lb

## 2021-02-27 DIAGNOSIS — R531 Weakness: Secondary | ICD-10-CM | POA: Diagnosis not present

## 2021-02-27 DIAGNOSIS — M25511 Pain in right shoulder: Secondary | ICD-10-CM

## 2021-02-27 DIAGNOSIS — G1221 Amyotrophic lateral sclerosis: Secondary | ICD-10-CM | POA: Diagnosis not present

## 2021-02-27 DIAGNOSIS — G8929 Other chronic pain: Secondary | ICD-10-CM | POA: Diagnosis not present

## 2021-02-27 MED ORDER — TRAMADOL HCL 50 MG PO TABS: 50.0000 mg | ORAL_TABLET | Freq: Three times a day (TID) | ORAL | 5 refills | Status: AC | PRN

## 2021-02-27 NOTE — Progress Notes (Signed)
Chief Complaint  Patient presents with   New Patient (Initial Visit)    Pt with wife, rm 40, pt has ALS and had been treated at [facility]. Here to establish care.       ASSESSMENT AND PLAN  Vernon Ruiz is a 70 y.o. male   Motor neuron disease  Confirmed by progressive painless muscle atrophy, fasciculation, weakness, hyperreflexia,  Multiple abnormal EMG nerve conduction study, followed up by [place] [facility]  On Rilutek Significant right shoulder pain  Tenderness upon deep palpitation, likely a musculoskeletal component,  Patient desired further evaluation, proceed with MRI of right shoulder  tramadol 50 mg as needed   DIAGNOSTIC DATA (LABS, IMAGING, TESTING) - I reviewed patient records, labs, notes, testing and imaging myself where available.  MRI cervical Mar 02 2020. 1. Status post ACDF from C3 through C5. 2. Interval progression of degenerative changes at C6-7 with severe right and moderate left neural foraminal narrowing. 3. No significant spinal canal stenosis at any level.  MRI lumbar Mar 02 2020 1. No significant change of the degree of spinal canal or neural foraminal stenosis from prior MRI. 2. Postsurgical changes from interbody fusion and posterior fixation from L1 through L4. 3. Moderate spinal canal stenosis at L2-3 and mild-to-moderate spinal canal stenosis at L3-4. 4. Moderate bilateral neural foraminal narrowing at L1-2.   MEDICAL HISTORY:  Vernon Ruiz is a 70 year old male, seen in request by nurse practitioner Kristen Loader, for evaluation of ALS, his primary care physician is Dr. Hulan Fess, MD, initial evaluation was on February 27, 2021.  I reviewed and summarized the referring note. PMHX HTN HLD DM AVR Afib, on eliquis Stroke, in Sep 2020, with right side weakness  He developed gradual onset  right foot weakness around early 2020, started seeing Dr. Jannifer Franklin in July 2020, mild low back pain, but denies radiating pain, his lower  extremity weakness progressively getting worse,  EMG nerve conduction study in July, later September 2020 revealed diffuse right upper extremity muscle fasciculation, chronic neuropathic denervation, also involving bilateral lower extremity, with the possibility of motor neuron disease  Related work-up showed severe lumbar stenosis at L2-3, L3-4, and moderate C3-4 stenosis, subtle spinal cord STIR hyperintensity on C3-4,  Also suffered left PCA stroke in September 2020, presented with confusion, short-term memory loss, right upper quadrant visual field deficit, right leg weakness, risk factor of atrial fibrillation, on Eliquis, and aspirin  Echocardiogram ejection fraction [id number] no cardiac embolic source. LDL 124, A1c of 8.5, He was discharged with Eliquis 5 mg twice a day plus aspirin 81 mg daily   He had anterolateral decompression of L1, 2, 3, 4 with posterior segment fixation L1-4 by Dr. Ellene Route in April 2021, but his weakness continued to progress  COVID infection December 2020, required hospital admission, dyspnea, fever chill, nausea, poor appetite, decreased p.o. intake, treated with steroid, remdesivir, convalescent plasma, x-ray showed multifocal obesity,  Since then, he had worsening weakness, including both lower extremity, right shoulder, but denies bulbar weakness, also has developed widespread muscle fasciculations  Eventually he was confirmed the diagnosis of motor neuron disease, referred to [place] [facility] since February 2022, started on Rilutek, revisit every 3 months, he hopes to have local neurologist by Dr. Flonnie Hailstone, M.D. / Rushie Goltz, M.D.  Repeat EMG nerve conduction study showed widespread chronic neuropathic changes involving cervical, thoracic, lumbar sacral regions.  Confirmed her diagnosis of motor neuron disease  Today he complains of significant right shoulder pain, tenderness  upon deep palpitation,  PHYSICAL EXAM:   Vitals:   02/27/21 0814  BP: 118/68   Pulse: 68  Weight: 167 lb (75.8 kg)  Height: 5\' 5"  (1.651 m)   Not recorded     Body mass index is 27.79 kg/m.  PHYSICAL EXAMNIATION:  Gen: NAD, conversant, well nourised, well groomed                     Cardiovascular: Regular rate rhythm, no peripheral edema, warm, nontender. Eyes: Conjunctivae clear without exudates or hemorrhage Neck: Supple, no carotid bruits. Pulmonary: Clear to auscultation bilaterally   NEUROLOGICAL EXAM:  MENTAL STATUS: Speech:    Speech is normal; fluent and spontaneous with normal comprehension.  Cognition:     Orientation to time, place and person     Normal recent and remote memory     Normal Attention span and concentration     Normal Language, naming, repeating,spontaneous speech     Fund of knowledge   CRANIAL NERVES: CN II: Visual fields are full to confrontation. Pupils are round equal and briskly reactive to light. CN III, IV, VI: extraocular movement are normal. No ptosis. CN V: Facial sensation is intact to light touch CN VII: Face is symmetric with normal eye closure  CN VIII: Hearing is normal to causal conversation. CN IX, X: Phonation is normal. CN XI: Head turning and shoulder shrug are intact  MOTOR: UE Shoulder Abduction Shoulder External Rotation Elbow Flexion Elbow  Extension Wrist Flexion Wrist Extension Grip Finger  Abduction  R 3 3 4 4 4 4 4 4   L 4 4 4 4  5- 5- 4 5-   LE Hip Flexion Knee flexion Knee extension Ankle Dorsiflexion Ankle plantar Flexion  R 0 0 0 0 0  L 4 5 5 4 5      REFLEXES: Reflexes are 3 and symmetric at the biceps, triceps, knees, and ankles. Plantar responses are extensor bilaterally  SENSORY: Intact to light touch, pinprick and vibratory sensation are intact in fingers and toes.  COORDINATION: There is no trunk or limb dysmetria noted.  GAIT/STANCE: Deferred  REVIEW OF SYSTEMS:  Full 14 system review of systems performed and notable only for as above All other review of systems  were negative.   ALLERGIES: Allergies  Allergen Reactions   Penicillins Itching, Rash and Other (See Comments)    All over body Did it involve swelling of the face/tongue/throat, SOB, or low BP? No Did it involve sudden or severe rash/hives, skin peeling, or any reaction on the inside of your mouth or nose? Yes Did you need to seek medical attention at a hospital or doctor's office? No When did it last happen?      20 + years If all above answers are "NO", may proceed with cephalosporin use.    Sulfa Antibiotics Rash    Rash    HOME MEDICATIONS: Current Outpatient Medications  Medication Sig Dispense Refill   acetaminophen (TYLENOL) 325 MG tablet Take 2 tablets (650 mg total) by mouth every 6 (six) hours as needed for mild pain.     amLODipine (NORVASC) 5 MG tablet TAKE 1 TABLET(5 MG) BY MOUTH DAILY 90 tablet 3   B Complex-C (B-COMPLEX WITH VITAMIN C) tablet Take 1 tablet by mouth 3 (three) times a week.     Cholecalciferol (VITAMIN D3) 250 MCG (10000 UT) capsule Take 10,000 Units by mouth 3 (three) times a week.     Dulaglutide (TRULICITY) 1.5 MG/0.5ML SOPN Inject 1.5 mg  into the skin every Sunday.      ELIQUIS 5 MG TABS tablet TAKE 1 TABLET(5 MG) BY MOUTH TWICE DAILY 180 tablet 1   empagliflozin (JARDIANCE) 25 MG TABS tablet Take 12.5 mg by mouth daily.     famotidine (PEPCID) 20 MG tablet Take 1 tablet (20 mg total) by mouth 2 (two) times daily for 15 days. (Patient taking differently: Take 20 mg by mouth 2 (two) times daily as needed (pain.).) 30 tablet 0   furosemide (LASIX) 20 MG tablet TAKE 1 TABLET BY MOUTH DAILY AS NEEDED FOR SWELLING 90 tablet 3   glimepiride (AMARYL) 4 MG tablet Take 4 mg by mouth 2 (two) times daily.      losartan (COZAAR) 100 MG tablet Take 100 mg by mouth daily.     meclizine (ANTIVERT) 25 MG tablet Take 25 mg by mouth 2 (two) times daily as needed (dizziness/vertigo).      metFORMIN (GLUCOPHAGE-XR) 500 MG 24 hr tablet Take 1,000 mg by mouth 2 (two)  times daily.     metoprolol tartrate (LOPRESSOR) 25 MG tablet Take 1 tablet (25 mg total) by mouth 2 (two) times daily. 180 tablet 3   omeprazole (PRILOSEC) 20 MG capsule Take 20 mg by mouth 2 (two) times daily as needed.     oxyCODONE-acetaminophen (PERCOCET/ROXICET) 5-325 MG tablet Take 1-2 tablets by mouth every 3 (three) hours as needed for moderate pain or severe pain. 60 tablet 0   riluzole (RILUTEK) 50 MG tablet Take 50 mg by mouth 2 (two) times daily.     rosuvastatin (CRESTOR) 20 MG tablet Take 1 tablet (20 mg total) by mouth daily. 90 tablet 3   TOUJEO SOLOSTAR 300 UNIT/ML Solostar Pen Inject 35 Units into the skin daily.      vitamin C (ASCORBIC ACID) 500 MG tablet Take 1,500 mg by mouth 3 (three) times a week.      zinc gluconate 50 MG tablet Take 50 mg by mouth daily.     No current facility-administered medications for this visit.    PAST MEDICAL HISTORY: Past Medical History:  Diagnosis Date   Aortic stenosis    s/p AVR using a 72mm Edwards Magna-Ease pericardial valve 10/04/16   Coronary artery disease    Diabetes mellitus without complication (HCC)    GERD (gastroesophageal reflux disease)    Heart murmur    Hypertension    Inguinal hernia 1970s   Obstructive sleep apnea    positive from a home test, still needs to get another study done   Persistent atrial fibrillation (HCC)    s/p clipping of LA appendage 10/04/16   Right carotid bruit 12/12/2018   Right foot drop 11/07/2018   Stroke ([place]) 12/12/2018    PAST SURGICAL HISTORY: Past Surgical History:  Procedure Laterality Date   ANTERIOR LAT LUMBAR FUSION N/A 07/17/2019   Procedure: Lumbar one-two Lumbar two-three Lumbar three-four Anterolateral lumbar interbody fusion with posterior fixation Lumbar one to Lumbar four with Mazor;  Surgeon: Kristeen Miss, MD;  Location: [place];  Service: Neurosurgery;  Laterality: N/A;   AORTIC VALVE REPLACEMENT N/A 10/04/2016   Procedure: AORTIC VALVE REPLACEMENT (AVR);  Surgeon:  Gaye Pollack, MD;  Location: [place];  Service: Open Heart Surgery;  Laterality: N/A;   APPENDECTOMY     APPLICATION OF ROBOTIC ASSISTANCE FOR SPINAL PROCEDURE N/A 07/17/2019   Procedure: APPLICATION OF ROBOTIC ASSISTANCE FOR SPINAL PROCEDURE;  Surgeon: Kristeen Miss, MD;  Location: [place];  Service: Neurosurgery;  Laterality: N/A;  BACK SURGERY     CARDIAC CATHETERIZATION N/A 03/10/2016   Procedure: Right/Left Heart Cath and Coronary Angiography;  Surgeon: Sherren Mocha, MD;  Location: [place] [facility];  Service: Cardiovascular;  Laterality: N/A;   CERVICAL SPINE SURGERY     07/13/2019: per patient 2020   CLIPPING OF ATRIAL APPENDAGE N/A 10/04/2016   Procedure: CLIPPING OF ATRIAL APPENDAGE;  Surgeon: Gaye Pollack, MD;  Location: [place];  Service: Open Heart Surgery;  Laterality: N/A;   COLONOSCOPY     EYE SURGERY Right    growth removed from eye   HERNIA REPAIR Bilateral    inguinal   LUMBAR PERCUTANEOUS PEDICLE SCREW 3 LEVEL N/A 07/17/2019   Procedure: Posterior fixation Lumbar 1 to Lumbar 4;  Surgeon: Kristeen Miss, MD;  Location: [place];  Service: Neurosurgery;  Laterality: N/A;   TEE WITHOUT CARDIOVERSION N/A 10/04/2016   Procedure: TRANSESOPHAGEAL ECHOCARDIOGRAM (TEE);  Surgeon: Gaye Pollack, MD;  Location: [place];  Service: Open Heart Surgery;  Laterality: N/A;    FAMILY HISTORY: No family history on file.  SOCIAL HISTORY: Social History   Socioeconomic History   Marital status: Married    Spouse name: Not on file   Number of children: Not on file   Years of education: Not on file   Highest education level: Not on file  Occupational History   Not on file  Tobacco Use   Smoking status: Former    Types: Cigars    Quit date: 09/16/2016    Years since quitting: 4.4   Smokeless tobacco: Never  Vaping Use   Vaping Use: Never used  Substance and Sexual Activity   Alcohol use: Yes    Comment: very rare   Drug use: No   Sexual activity: Not on file  Other Topics Concern    Not on file  Social History Narrative   Retired   Owns hotels   Social Determinants of Radio broadcast assistant Strain: Not on Art therapist Insecurity: Not on file  Transportation Needs: Not on file  Physical Activity: Not on file  Stress: Not on file  Social Connections: Not on file  Intimate Partner Violence: Not on file    Total time spent reviewing the chart, obtaining history, examined patient, ordering tests, documentation, consultations and family, care coordination was 49 minutes     Marcial Pacas, M.D. Ph.D.  [facility] [address], [place], [place] [address] Ph: [phone] Fax: [phone]  CC:  Kristen Loader, [place],  [place] [address]  Hulan Fess, MD

## 2021-03-03 ENCOUNTER — Encounter: Payer: Self-pay | Admitting: Neurology

## 2021-03-03 DIAGNOSIS — Z794 Long term (current) use of insulin: Secondary | ICD-10-CM | POA: Diagnosis not present

## 2021-03-03 DIAGNOSIS — E119 Type 2 diabetes mellitus without complications: Secondary | ICD-10-CM | POA: Diagnosis not present

## 2021-03-03 DIAGNOSIS — I10 Essential (primary) hypertension: Secondary | ICD-10-CM | POA: Diagnosis not present

## 2021-03-03 DIAGNOSIS — G1221 Amyotrophic lateral sclerosis: Secondary | ICD-10-CM | POA: Diagnosis not present

## 2021-03-03 DIAGNOSIS — Z7901 Long term (current) use of anticoagulants: Secondary | ICD-10-CM | POA: Diagnosis not present

## 2021-03-03 DIAGNOSIS — Z7984 Long term (current) use of oral hypoglycemic drugs: Secondary | ICD-10-CM | POA: Diagnosis not present

## 2021-03-06 DIAGNOSIS — Z7901 Long term (current) use of anticoagulants: Secondary | ICD-10-CM | POA: Diagnosis not present

## 2021-03-06 DIAGNOSIS — I10 Essential (primary) hypertension: Secondary | ICD-10-CM | POA: Diagnosis not present

## 2021-03-06 DIAGNOSIS — E119 Type 2 diabetes mellitus without complications: Secondary | ICD-10-CM | POA: Diagnosis not present

## 2021-03-06 DIAGNOSIS — Z7984 Long term (current) use of oral hypoglycemic drugs: Secondary | ICD-10-CM | POA: Diagnosis not present

## 2021-03-06 DIAGNOSIS — G1221 Amyotrophic lateral sclerosis: Secondary | ICD-10-CM | POA: Diagnosis not present

## 2021-03-06 DIAGNOSIS — Z794 Long term (current) use of insulin: Secondary | ICD-10-CM | POA: Diagnosis not present

## 2021-03-10 ENCOUNTER — Other Ambulatory Visit: Payer: Self-pay | Admitting: Cardiovascular Disease

## 2021-03-10 DIAGNOSIS — I10 Essential (primary) hypertension: Secondary | ICD-10-CM | POA: Diagnosis not present

## 2021-03-10 DIAGNOSIS — Z7984 Long term (current) use of oral hypoglycemic drugs: Secondary | ICD-10-CM | POA: Diagnosis not present

## 2021-03-10 DIAGNOSIS — G1221 Amyotrophic lateral sclerosis: Secondary | ICD-10-CM | POA: Diagnosis not present

## 2021-03-10 DIAGNOSIS — E119 Type 2 diabetes mellitus without complications: Secondary | ICD-10-CM | POA: Diagnosis not present

## 2021-03-10 DIAGNOSIS — Z7901 Long term (current) use of anticoagulants: Secondary | ICD-10-CM | POA: Diagnosis not present

## 2021-03-10 DIAGNOSIS — Z794 Long term (current) use of insulin: Secondary | ICD-10-CM | POA: Diagnosis not present

## 2021-03-10 NOTE — Telephone Encounter (Signed)
Pt last saw Chelsea Aus, PA on 05/27/20, last labs 09/11/20 Creat 0.78 at [facility] per [facility], age 71, weight 75.8kg, based on specified criteria pt is on appropriate dosage of Eliquis 5mg  BID.  Will refill rx.

## 2021-03-11 DIAGNOSIS — I10 Essential (primary) hypertension: Secondary | ICD-10-CM | POA: Diagnosis not present

## 2021-03-11 DIAGNOSIS — Z7984 Long term (current) use of oral hypoglycemic drugs: Secondary | ICD-10-CM | POA: Diagnosis not present

## 2021-03-11 DIAGNOSIS — E119 Type 2 diabetes mellitus without complications: Secondary | ICD-10-CM | POA: Diagnosis not present

## 2021-03-11 DIAGNOSIS — Z794 Long term (current) use of insulin: Secondary | ICD-10-CM | POA: Diagnosis not present

## 2021-03-11 DIAGNOSIS — Z7901 Long term (current) use of anticoagulants: Secondary | ICD-10-CM | POA: Diagnosis not present

## 2021-03-11 DIAGNOSIS — G1221 Amyotrophic lateral sclerosis: Secondary | ICD-10-CM | POA: Diagnosis not present

## 2021-03-12 ENCOUNTER — Telehealth: Payer: Self-pay | Admitting: Neurology

## 2021-03-12 NOTE — Addendum Note (Signed)
Addended by: Ann Maki on: 03/12/2021 01:50 PM   Modules accepted: Orders

## 2021-03-12 NOTE — Telephone Encounter (Signed)
Dr. Terrace Arabia,  Pt is asking for med to help with upcoming MRI.  I have pended RX for Xanax 1 mg-- take 1-2 tablets 30 mins prior to procedure and 1 additional right before MRI study begins. Must have a driver   Please sign if agreeable, thanks!

## 2021-03-12 NOTE — Telephone Encounter (Signed)
Pt is claustrophobic and would like something to be called in to help him relax for his MRI.  Please call.

## 2021-03-13 MED ORDER — ALPRAZOLAM 1 MG PO TABS: ORAL_TABLET | ORAL | 0 refills | Status: AC

## 2021-03-13 NOTE — Telephone Encounter (Signed)
Meds ordered this encounter  Medications   ALPRAZolam (XANAX) 1 MG tablet    Sig: Take 1-2 tablets 30 mins before MRI, one additional tablet can be used when MRI study starts. MUST have Driver    Dispense:  3 tablet    Refill:  0

## 2021-03-13 NOTE — Addendum Note (Signed)
Addended by: Levert Feinstein on: 03/13/2021 12:11 PM   Modules accepted: Orders

## 2021-03-16 NOTE — Telephone Encounter (Signed)
I called the pt and advised rx has been sent and reiterated driver must be present.  Pt confirmed he had picked up his rx over the weekend.

## 2021-03-17 DIAGNOSIS — I10 Essential (primary) hypertension: Secondary | ICD-10-CM | POA: Diagnosis not present

## 2021-03-17 DIAGNOSIS — G1221 Amyotrophic lateral sclerosis: Secondary | ICD-10-CM | POA: Diagnosis not present

## 2021-03-17 DIAGNOSIS — E119 Type 2 diabetes mellitus without complications: Secondary | ICD-10-CM | POA: Diagnosis not present

## 2021-03-17 DIAGNOSIS — Z7901 Long term (current) use of anticoagulants: Secondary | ICD-10-CM | POA: Diagnosis not present

## 2021-03-17 DIAGNOSIS — Z794 Long term (current) use of insulin: Secondary | ICD-10-CM | POA: Diagnosis not present

## 2021-03-17 DIAGNOSIS — Z7984 Long term (current) use of oral hypoglycemic drugs: Secondary | ICD-10-CM | POA: Diagnosis not present

## 2021-03-18 DIAGNOSIS — E119 Type 2 diabetes mellitus without complications: Secondary | ICD-10-CM | POA: Diagnosis not present

## 2021-03-18 DIAGNOSIS — I10 Essential (primary) hypertension: Secondary | ICD-10-CM | POA: Diagnosis not present

## 2021-03-18 DIAGNOSIS — Z7984 Long term (current) use of oral hypoglycemic drugs: Secondary | ICD-10-CM | POA: Diagnosis not present

## 2021-03-18 DIAGNOSIS — G1221 Amyotrophic lateral sclerosis: Secondary | ICD-10-CM | POA: Diagnosis not present

## 2021-03-18 DIAGNOSIS — Z7901 Long term (current) use of anticoagulants: Secondary | ICD-10-CM | POA: Diagnosis not present

## 2021-03-18 DIAGNOSIS — Z794 Long term (current) use of insulin: Secondary | ICD-10-CM | POA: Diagnosis not present

## 2021-03-20 ENCOUNTER — Other Ambulatory Visit: Payer: Self-pay

## 2021-03-20 ENCOUNTER — Ambulatory Visit
Admission: RE | Admit: 2021-03-20 | Discharge: 2021-03-20 | Disposition: A | Discharge status: Home / Self Care | Payer: Medicare Other | Source: Ambulatory Visit | Attending: Neurology | Admitting: Neurology

## 2021-03-20 DIAGNOSIS — G1221 Amyotrophic lateral sclerosis: Secondary | ICD-10-CM

## 2021-03-20 DIAGNOSIS — R531 Weakness: Secondary | ICD-10-CM

## 2021-03-20 DIAGNOSIS — G8929 Other chronic pain: Secondary | ICD-10-CM

## 2021-03-20 DIAGNOSIS — M25511 Pain in right shoulder: Secondary | ICD-10-CM

## 2021-03-24 DIAGNOSIS — I10 Essential (primary) hypertension: Secondary | ICD-10-CM | POA: Diagnosis not present

## 2021-03-24 DIAGNOSIS — Z7901 Long term (current) use of anticoagulants: Secondary | ICD-10-CM | POA: Diagnosis not present

## 2021-03-24 DIAGNOSIS — Z794 Long term (current) use of insulin: Secondary | ICD-10-CM | POA: Diagnosis not present

## 2021-03-24 DIAGNOSIS — E119 Type 2 diabetes mellitus without complications: Secondary | ICD-10-CM | POA: Diagnosis not present

## 2021-03-24 DIAGNOSIS — G1221 Amyotrophic lateral sclerosis: Secondary | ICD-10-CM | POA: Diagnosis not present

## 2021-03-24 DIAGNOSIS — Z7984 Long term (current) use of oral hypoglycemic drugs: Secondary | ICD-10-CM | POA: Diagnosis not present

## 2021-03-30 ENCOUNTER — Telehealth: Payer: Self-pay | Admitting: Neurology

## 2021-03-30 NOTE — Telephone Encounter (Signed)
MRI of right shoulder showed significant pathology of right shoulder, large full-thickness near complete tearing of the supraspinatus tendon with 3.4 cm retraction.  There is also evidence of moderate tendinosis of the infraspinatus, subscapularis tendon.   Partial-thickness cartilage loss of the glenohumeral joints.  Thickening of the inferior joint capsule seen seen with adhesive capsulitis.   If you continue have significant right shoulder pain, you may benefit orthopedic evaluation.   Vernon Ruiz, M.D. Ph.D. ______________________________________  He is established with Dr. Linna Caprice at [facility]. He would like his MRI results faxed over to his attention. The patient is going to pick up the disc from [facility].

## 2021-03-30 NOTE — Telephone Encounter (Signed)
Pt is asking for a call to discuss referral options as a result of the MRI findings, please call.

## 2021-03-31 DIAGNOSIS — R262 Difficulty in walking, not elsewhere classified: Secondary | ICD-10-CM | POA: Diagnosis not present

## 2021-03-31 DIAGNOSIS — G1221 Amyotrophic lateral sclerosis: Secondary | ICD-10-CM | POA: Diagnosis not present

## 2021-03-31 DIAGNOSIS — Z5181 Encounter for therapeutic drug level monitoring: Secondary | ICD-10-CM | POA: Diagnosis not present

## 2021-03-31 DIAGNOSIS — M6281 Muscle weakness (generalized): Secondary | ICD-10-CM | POA: Diagnosis not present

## 2021-04-07 DIAGNOSIS — E119 Type 2 diabetes mellitus without complications: Secondary | ICD-10-CM | POA: Diagnosis not present

## 2021-04-07 DIAGNOSIS — I10 Essential (primary) hypertension: Secondary | ICD-10-CM | POA: Diagnosis not present

## 2021-04-07 DIAGNOSIS — R809 Proteinuria, unspecified: Secondary | ICD-10-CM | POA: Diagnosis not present

## 2021-04-07 DIAGNOSIS — Z8673 Personal history of transient ischemic attack (TIA), and cerebral infarction without residual deficits: Secondary | ICD-10-CM | POA: Diagnosis not present

## 2021-04-07 DIAGNOSIS — Z6831 Body mass index (BMI) 31.0-31.9, adult: Secondary | ICD-10-CM | POA: Diagnosis not present

## 2021-04-07 DIAGNOSIS — I4891 Unspecified atrial fibrillation: Secondary | ICD-10-CM | POA: Diagnosis not present

## 2021-04-07 DIAGNOSIS — E785 Hyperlipidemia, unspecified: Secondary | ICD-10-CM | POA: Diagnosis not present

## 2021-05-06 ENCOUNTER — Ambulatory Visit: Payer: Medicare Other | Admitting: Podiatry

## 2021-05-11 ENCOUNTER — Telehealth: Payer: Self-pay | Admitting: Cardiovascular Disease

## 2021-05-11 MED ORDER — METOPROLOL TARTRATE 25 MG PO TABS: 25.0000 mg | ORAL_TABLET | Freq: Two times a day (BID) | ORAL | 3 refills | Status: AC

## 2021-05-11 MED ORDER — AMLODIPINE BESYLATE 5 MG PO TABS: 5.0000 mg | ORAL_TABLET | Freq: Every day | ORAL | 0 refills | Status: DC

## 2021-05-11 NOTE — Telephone Encounter (Signed)
90 Day refill for Lopressor and Amlodipine has been sent to [facility], per pt's request. Pt is due to be seen in February and has been asked to call and schedule.

## 2021-05-11 NOTE — Telephone Encounter (Signed)
*  STAT* If patient is at the pharmacy, call can be transferred to refill team.   1. Which medications need to be refilled? (please list name of each medication and dose if known)  amLODipine (NORVASC) 5 MG tablet metoprolol tartrate (LOPRESSOR) 25 MG tablet  2. Which pharmacy/location (including street and city if local pharmacy) is medication to be sent to? [id number] - [place], [place] - [address]  3. Do they need a 30 day or 90 day supply?   90 day supply

## 2021-05-25 ENCOUNTER — Other Ambulatory Visit: Payer: Self-pay

## 2021-05-25 ENCOUNTER — Ambulatory Visit (INDEPENDENT_AMBULATORY_CARE_PROVIDER_SITE_OTHER): Payer: Medicare Other | Admitting: Podiatry

## 2021-05-25 ENCOUNTER — Encounter: Payer: Self-pay | Admitting: Podiatry

## 2021-05-25 DIAGNOSIS — E1151 Type 2 diabetes mellitus with diabetic peripheral angiopathy without gangrene: Secondary | ICD-10-CM | POA: Diagnosis not present

## 2021-05-25 DIAGNOSIS — E119 Type 2 diabetes mellitus without complications: Secondary | ICD-10-CM | POA: Insufficient documentation

## 2021-05-25 DIAGNOSIS — B351 Tinea unguium: Secondary | ICD-10-CM

## 2021-05-25 DIAGNOSIS — E1169 Type 2 diabetes mellitus with other specified complication: Secondary | ICD-10-CM

## 2021-05-25 DIAGNOSIS — Z981 Arthrodesis status: Secondary | ICD-10-CM | POA: Insufficient documentation

## 2021-05-25 DIAGNOSIS — R29898 Other symptoms and signs involving the musculoskeletal system: Secondary | ICD-10-CM | POA: Insufficient documentation

## 2021-05-25 DIAGNOSIS — D689 Coagulation defect, unspecified: Secondary | ICD-10-CM

## 2021-05-25 DIAGNOSIS — M5410 Radiculopathy, site unspecified: Secondary | ICD-10-CM | POA: Insufficient documentation

## 2021-05-25 NOTE — Progress Notes (Signed)
This patient returns to my office for at risk foot care.  This patient requires this care by a professional since this patient will be at risk due to having coagulation defect due to eliquis.  This patient is unable to cut nails himself since the patient cannot reach his nails.These nails are painful walking and wearing shoes.  Patient presents to the office with his wife in a wheelchair.  This patient presents for at risk foot care today.  General Appearance  Alert, conversant and in no acute stress.  Vascular  Dorsalis pedis and posterior tibial  pulses are palpable  bilaterally.  Capillary return is within normal limits  bilaterally. Temperature is within normal limits  bilaterally.  Neurologic  Senn-Weinstein monofilament wire test within normal limits  bilaterally. Muscle power within normal limits bilaterally.  Nails Thick disfigured discolored nails with subungual debris  from hallux to fifth toes bilaterally. No evidence of bacterial infection or drainage bilaterally.  Orthopedic  No limitations of motion  feet .  No crepitus or effusions noted.  No bony pathology or digital deformities noted  Foot drop right foot.  Skin  normotropic skin with no porokeratosis noted bilaterally.  No signs of infections or ulcers noted.     Onychomycosis  Pain in right toes  Pain in left toes  Consent was obtained for treatment procedures.   Mechanical debridement of nails 1-5  bilaterally performed with a nail nipper.  Filed with dremel without incident.    Return office visit   3 months                  Told patient to return for periodic foot care and evaluation due to potential at risk complications.   Helane Gunther DPM

## 2021-06-08 ENCOUNTER — Other Ambulatory Visit: Payer: Self-pay | Admitting: Physician Assistant

## 2021-06-09 ENCOUNTER — Other Ambulatory Visit: Payer: Self-pay | Admitting: Cardiovascular Disease

## 2021-07-16 ENCOUNTER — Ambulatory Visit (INDEPENDENT_AMBULATORY_CARE_PROVIDER_SITE_OTHER): Payer: Medicare Other | Admitting: Podiatry

## 2021-07-16 ENCOUNTER — Encounter: Payer: Self-pay | Admitting: Podiatry

## 2021-07-16 DIAGNOSIS — L02619 Cutaneous abscess of unspecified foot: Secondary | ICD-10-CM

## 2021-07-16 DIAGNOSIS — L03119 Cellulitis of unspecified part of limb: Secondary | ICD-10-CM

## 2021-07-16 DIAGNOSIS — L081 Erythrasma: Secondary | ICD-10-CM | POA: Diagnosis not present

## 2021-07-16 DIAGNOSIS — Z789 Other specified health status: Secondary | ICD-10-CM | POA: Diagnosis not present

## 2021-07-16 MED ORDER — CLINDAMYCIN PHOSPHATE 1 % EX SOLN
Freq: Two times a day (BID) | CUTANEOUS | 2 refills | Status: AC
Start: 2021-07-16 — End: ?

## 2021-07-18 NOTE — Progress Notes (Signed)
He presents today chief complaint of right foot redness and swelling x1 month and the swelling has been present for about a year he said he has been to a lot of doctors and they just keep telling him to elevate and his wife said there was some drainage between his toes just the other day. ? ?Past medical history significant for ALS and is wheelchair-bound. ? ?Objective: Pitting edema bilateral lower extremity pulses are nonpalpable.  There does not appear to be any significant cellulitic infection though he does have some skin breakdown between the toes.  No signs of infection. ? ?Assessment: ALS peripheral vascular disease and interdigital tinea or erythrasma. ? ?Plan: I am going to start him on clindamycin solution to the toes twice daily and to request ABIs ?

## 2021-07-21 ENCOUNTER — Ambulatory Visit (HOSPITAL_COMMUNITY)
Admission: RE | Admit: 2021-07-21 | Discharge: 2021-07-21 | Disposition: A | Discharge status: Home / Self Care | Payer: Medicare Other | Source: Ambulatory Visit | Attending: Podiatry | Admitting: Podiatry

## 2021-07-21 DIAGNOSIS — L081 Erythrasma: Secondary | ICD-10-CM | POA: Insufficient documentation

## 2021-07-21 DIAGNOSIS — Z789 Other specified health status: Secondary | ICD-10-CM | POA: Diagnosis present

## 2021-07-21 DIAGNOSIS — L02619 Cutaneous abscess of unspecified foot: Secondary | ICD-10-CM | POA: Insufficient documentation

## 2021-07-21 DIAGNOSIS — L03119 Cellulitis of unspecified part of limb: Secondary | ICD-10-CM | POA: Insufficient documentation

## 2021-07-21 NOTE — Progress Notes (Signed)
ABI has been completed.  ? ?Preliminary results in CV Proc.  ? ?Armanii Pressnell Jamina Macbeth ?07/21/2021 1:46 PM    ?

## 2021-07-22 ENCOUNTER — Telehealth: Payer: Self-pay | Admitting: *Deleted

## 2021-07-22 DIAGNOSIS — Z789 Other specified health status: Secondary | ICD-10-CM

## 2021-07-22 NOTE — Telephone Encounter (Signed)
-----   Message from Garrel Ridgel, [place] sent at 07/22/2021 11:51 AM EDT ----- ?Please make sure that vascular follows up with him for the moderate vascular disease. ?

## 2021-07-31 ENCOUNTER — Emergency Department (HOSPITAL_BASED_OUTPATIENT_CLINIC_OR_DEPARTMENT_OTHER)
Admission: EM | Admit: 2021-07-31 | Discharge: 2021-07-31 | Disposition: A | Discharge status: Home / Self Care | Payer: Medicare Other | Attending: Emergency Medicine | Admitting: Emergency Medicine

## 2021-07-31 ENCOUNTER — Other Ambulatory Visit: Payer: Self-pay

## 2021-07-31 ENCOUNTER — Encounter (HOSPITAL_BASED_OUTPATIENT_CLINIC_OR_DEPARTMENT_OTHER): Payer: Self-pay | Admitting: Pediatrics

## 2021-07-31 DIAGNOSIS — R339 Retention of urine, unspecified: Secondary | ICD-10-CM | POA: Insufficient documentation

## 2021-07-31 DIAGNOSIS — Z7901 Long term (current) use of anticoagulants: Secondary | ICD-10-CM | POA: Diagnosis not present

## 2021-07-31 LAB — CBC
HCT: 48.7 % (ref 39.0–52.0)
Hemoglobin: 15.5 g/dL (ref 13.0–17.0)
MCH: 26.1 pg (ref 26.0–34.0)
MCHC: 31.8 g/dL (ref 30.0–36.0)
MCV: 82 fL (ref 80.0–100.0)
Platelets: 257 10*3/uL (ref 150–400)
RBC: 5.94 MIL/uL — ABNORMAL HIGH (ref 4.22–5.81)
RDW: 14.1 % (ref 11.5–15.5)
WBC: 10.6 10*3/uL — ABNORMAL HIGH (ref 4.0–10.5)
nRBC: 0 % (ref 0.0–0.2)

## 2021-07-31 LAB — URINALYSIS, ROUTINE W REFLEX MICROSCOPIC
Bilirubin Urine: NEGATIVE
Glucose, UA: >1000 mg/dL — AB
Ketones, ur: NEGATIVE mg/dL
Leukocytes,Ua: NEGATIVE
Nitrite: NEGATIVE
Protein, ur: 30 mg/dL — AB
Specific Gravity, Urine: 1.012 (ref 1.005–1.030)
pH: 7 (ref 5.0–8.0)

## 2021-07-31 LAB — BASIC METABOLIC PANEL
Anion gap: 13 (ref 5–15)
BUN: 21 mg/dL (ref 8–23)
CO2: 24 mmol/L (ref 22–32)
Calcium: 10.1 mg/dL (ref 8.9–10.3)
Chloride: 100 mmol/L (ref 98–111)
Creatinine, Ser: 0.58 mg/dL — ABNORMAL LOW (ref 0.61–1.24)
GFR, Estimated: >60 mL/min (ref >60)
Glucose, Bld: 193 mg/dL — ABNORMAL HIGH (ref 70–99)
Potassium: 4.4 mmol/L (ref 3.5–5.1)
Sodium: 137 mmol/L (ref 135–145)

## 2021-07-31 NOTE — Discharge Instructions (Signed)
Please follow-up with urology.  Call their office today to request appointment sometime next week.  Continue the Foley catheter until you are seen in the urology office.  They will likely remove it at time of visit.  If you develop fever, any issues with recurrent urinary retention or issues with the Foley catheter, come back to ER for reassessment. ?

## 2021-07-31 NOTE — ED Triage Notes (Signed)
C/o difficulty voiding; stated last time he urinated was last night; and it just dribbles ever since;  ?

## 2021-08-02 NOTE — ED Provider Notes (Signed)
?[facility]PT ?Provider Note ? ? ?CSN: [id number] ?Arrival date & time: 07/31/21  0944 ? ?  ? ?History ? ?Chief Complaint  ?Patient presents with  ? Urinary Retention  ? ? ?Vernon Ruiz is a 71 y.o. male.  Presents to ER due to concern for urinary retention.  He reports that last night was the last time that he urinated and has only had very small dribbles ever since.  Has never had issues with urinary retention before.  His wife who is at bedside further reports that patient does seem to be going to urinate more frequently at night over the past while.  Patient reports that he has some discomfort in his lower abdomen.  He denies any fevers nausea or vomiting. ? ?After Foley catheter placed patient had complete resolution of the abdominal discomfort. ? ?HPI ? ?  ? ?Home Medications ?Prior to Admission medications   ?Medication Sig Start Date End Date Taking? Authorizing Provider  ?acetaminophen (TYLENOL) 325 MG tablet Take 2 tablets (650 mg total) by mouth every 6 (six) hours as needed for mild pain. 10/08/16   Ardelle Balls, PA-C  ?ALPRAZolam (XANAX) 1 MG tablet Take 1-2 tablets 30 mins before MRI, one additional tablet can be used when MRI study starts. MUST have Driver 93/7/90   Levert Feinstein, MD  ?amLODipine (NORVASC) 5 MG tablet TAKE 1 TABLET(5 MG) BY MOUTH DAILY. FOLLOW UP APPOINTMENT 06/09/21   [name], MD  ?azithromycin (ZITHROMAX) 250 MG tablet azithromycin 250 mg tablet    [provider]  ?B Complex-C (B-COMPLEX WITH VITAMIN C) tablet Take 1 tablet by mouth 3 (three) times a week.    [provider]  ?Baclofen 5 MG TABS baclofen 5 mg tablet    [provider]  ?Cholecalciferol (VITAMIN D3) 250 MCG (10000 UT) capsule Take 10,000 Units by mouth 3 (three) times a week.    [provider]  ?clindamycin (CLEOCIN T) 1 % external solution Apply topically 2 (two) times daily. 07/16/21   Hyatt, Max T, DPM  ?dexamethasone (DECADRON) 2 MG tablet  dexamethasone 2 mg tablet    [provider]  ?dicyclomine (BENTYL) 10 MG capsule dicyclomine 10 mg capsule    [provider]  ?Dulaglutide (TRULICITY) 1.5 MG/0.5ML SOPN Inject 1.5 mg into the skin every Sunday.     [provider]  ?ELIQUIS 5 MG TABS tablet TAKE 1 TABLET(5 MG) BY MOUTH TWICE DAILY 03/10/21   [name], MD  ?empagliflozin (JARDIANCE) 25 MG TABS tablet Take 12.5 mg by mouth daily.    [provider]  ?famotidine (PEPCID) 20 MG tablet Take 1 tablet (20 mg total) by mouth 2 (two) times daily for 15 days. ?Patient taking differently: Take 20 mg by mouth 2 (two) times daily as needed (pain.). 03/18/19 06/25/20  Elgergawy, Leana Roe, MD  ?furosemide (LASIX) 20 MG tablet TAKE 1 TABLET BY MOUTH DAILY AS NEEDED FOR SWELLING 05/16/20   [name], MD  ?gabapentin (NEURONTIN) 300 MG capsule gabapentin 300 mg capsule 06/16/20   [provider]  ?glimepiride (AMARYL) 4 MG tablet Take 4 mg by mouth 2 (two) times daily.  10/15/18   [provider]  ?hydroxyurea (HYDREA) 500 MG capsule hydroxyurea 500 mg capsule    [provider]  ?losartan (COZAAR) 100 MG tablet Take 100 mg by mouth daily.    [provider]  ?losartan (COZAAR) 50 MG tablet Take 50 mg by mouth 2 (two) times daily. 03/01/21  [provider]  ?meclizine (ANTIVERT) 25 MG tablet Take 25 mg by mouth 2 (two) times daily as needed (dizziness/vertigo).     [provider]  ?metFORMIN (GLUCOPHAGE-XR) 500 MG 24 hr tablet Take 1,000 mg by mouth 2 (two) times daily. 04/01/19   [provider]  ?metoprolol tartrate (LOPRESSOR) 25 MG tablet Take 1 tablet (25 mg total) by mouth 2 (two) times daily. 05/11/21   [name], MD  ?molnupiravir EUA (LAGEVRIO) 200 MG CAPS capsule Lagevrio 200 mg capsule (EUA)    [provider]  ?omeprazole (PRILOSEC) 20 MG capsule Take 20 mg by mouth 2 (two) times daily as needed. 03/19/19   [provider]   ?oxyCODONE-acetaminophen (PERCOCET/ROXICET) 5-325 MG tablet Take 1-2 tablets by mouth every 3 (three) hours as needed for moderate pain or severe pain. 07/20/19   [name], MD  ?pyridostigmine (MESTINON) 60 MG tablet pyridostigmine bromide 60 mg tablet    [provider]  ?RELYVRIO 3-1 g PACK Take by mouth. 07/09/21   [provider]  ?riluzole (RILUTEK) 50 MG tablet Take 50 mg by mouth 2 (two) times daily. 12/17/20   [provider]  ?rosuvastatin (CRESTOR) 20 MG tablet Take 1 tablet (20 mg total) by mouth daily. 04/10/19 07/17/19  [name], MD  ?Nathen May SOLOSTAR 300 UNIT/ML Solostar Pen Inject 35 Units into the skin daily.  02/13/19   [provider]  ?traMADol (ULTRAM) 50 MG tablet Take 1 tablet (50 mg total) by mouth 3 (three) times daily as needed. 02/27/21   Levert Feinstein, MD  ?vitamin C (ASCORBIC ACID) 500 MG tablet Take 1,500 mg by mouth 3 (three) times a week.     [provider]  ?zinc gluconate 50 MG tablet Take 50 mg by mouth daily.    [provider]  ?   ? ?Allergies    ?Penicillins, Atorvastatin, Ketoconazole, and Sulfa antibiotics   ? ?Review of Systems   ?Review of Systems  ?Constitutional:  Negative for chills and fever.  ?HENT:  Negative for ear pain and sore throat.   ?Eyes:  Negative for pain and visual disturbance.  ?Respiratory:  Negative for cough and shortness of breath.   ?Cardiovascular:  Negative for chest pain and palpitations.  ?Gastrointestinal:  Negative for abdominal pain and vomiting.  ?Genitourinary:  Positive for decreased urine volume. Negative for dysuria and hematuria.  ?Musculoskeletal:  Negative for arthralgias and back pain.  ?Skin:  Negative for color change and rash.  ?Neurological:  Negative for seizures and syncope.  ?All other systems reviewed and are negative. ? ?Physical Exam ?Updated Vital Signs ?BP 100/87   Pulse 72   Temp 97.8 ?F (36.6 ?C) (Oral)   Resp 16   Ht 5\' 5"  (1.651 m)   Wt 77.1 kg   SpO2 96%    BMI 28.29 kg/m?  ?Physical Exam ?Vitals and nursing note reviewed.  ?Constitutional:   ?   General: He is not in acute distress. ?   Appearance: He is well-developed.  ?HENT:  ?   Head: Normocephalic and atraumatic.  ?Eyes:  ?   Conjunctiva/sclera: Conjunctivae normal.  ?Cardiovascular:  ?   Rate and Rhythm: Normal rate and regular rhythm.  ?   Heart sounds: No murmur heard. ?Pulmonary:  ?   Effort: Pulmonary effort is normal. No respiratory distress.  ?   Breath sounds: Normal breath sounds.  ?Abdominal:  ?   Palpations: Abdomen is soft.  ?   Tenderness: There is no abdominal tenderness.  ?  Musculoskeletal:     ?   General: No swelling.  ?   Cervical back: Neck supple.  ?Skin: ?   General: Skin is warm and dry.  ?   Capillary Refill: Capillary refill takes less than 2 seconds.  ?Neurological:  ?   Mental Status: He is alert.  ?Psychiatric:     ?   Mood and Affect: Mood normal.  ? ? ?ED Results / Procedures / Treatments   ?Labs ?(all labs ordered are listed, but only abnormal results are displayed) ?Labs Reviewed  ?CBC - Abnormal; Notable for the following components:  ?    Result Value  ? WBC 10.6 (*)   ? RBC 5.94 (*)   ? All other components within normal limits  ?BASIC METABOLIC PANEL - Abnormal; Notable for the following components:  ? Glucose, Bld 193 (*)   ? Creatinine, Ser 0.58 (*)   ? All other components within normal limits  ?URINALYSIS, ROUTINE W REFLEX MICROSCOPIC - Abnormal; Notable for the following components:  ? Glucose, UA >1,000 (*)   ? Hgb urine dipstick TRACE (*)   ? Protein, ur 30 (*)   ? All other components within normal limits  ? ? ?EKG ?None ? ?Radiology ?No results found. ? ?Procedures ?Procedures  ? ? ?Medications Ordered in ED ?Medications - No data to display ? ?ED Course/ Medical Decision Making/ A&P ?  ?                        ?Medical Decision Making ?Amount and/or Complexity of Data Reviewed ?Labs: ordered. ? ? ?71 year old gentleman presents to ER due to concern for urinary  retention.  Patient had endorsed some abdominal discomfort.  Foley catheter was placed and patient had complete resolution of his symptoms.  His urinalysis does not appear to be infected at this time.  Basic lab work [employer]

## 2021-08-11 ENCOUNTER — Ambulatory Visit (INDEPENDENT_AMBULATORY_CARE_PROVIDER_SITE_OTHER): Payer: Medicare Other | Admitting: Vascular Surgery

## 2021-08-11 ENCOUNTER — Encounter: Payer: Self-pay | Admitting: Vascular Surgery

## 2021-08-11 VITALS — BP 85/55 | HR 80 | Temp 98.5°F | Resp 20 | Ht 65.0 in | Wt 174.0 lb

## 2021-08-11 DIAGNOSIS — I739 Peripheral vascular disease, unspecified: Secondary | ICD-10-CM

## 2021-08-11 NOTE — Progress Notes (Signed)
[facility] [place] ? ?ASSESSMENT / PLAN: ?Vernon Ruiz is a 71 y.o. male with atherosclerosis of native arteries of bilateral lower extremities causing no symptoms. ? ?Recommend the following which can slow the progression of atherosclerosis and reduce the risk of major adverse cardiac / limb events:  ?Complete cessation from all tobacco products. ?Blood glucose control with goal A1c < 7%. ?Blood pressure control with goal blood pressure < 140/90 mmHg. ?Lipid reduction therapy with goal LDL-C <100 mg/dL (<70 if symptomatic from PAD).  ?Aspirin 81mg  PO QD.  ?Atorvastatin 40-80mg  PO QD (or other "high intensity" statin therapy). ? ?Vernon Ruiz is not a revascularization candidate.  Thankfully his arterial disease is mild.  He has no limb threatening features.  Recommend medical therapy only.  He may benefit from pain management evaluation.  Follow-up with me as needed. ? ?CHIEF COMPLAINT: Peripheral arterial disease. ? ?HISTORY OF PRESENT ILLNESS: ?Vernon Ruiz is a 71 y.o. male with amyotrophic lateral sclerosis which is in a fairly advanced stage.  The patient is nonambulatory, and confined to a wheelchair.  He has chronic pain throughout his back and right shoulder.  He has no movement of the right lower extremity.  He has some movement at the hip and the left lower extremity.  He does not have symptoms classic of ischemic rest pain.  He has no ulcers about his feet. ? ?Past Medical History:  ?Diagnosis Date  ? Aortic stenosis   ? s/p AVR using a 62mm Edwards Magna-Ease pericardial valve 10/04/16  ? Coronary artery disease   ? Diabetes mellitus without complication ([place])   ? GERD (gastroesophageal reflux disease)   ? Heart murmur   ? Hypertension   ? Inguinal hernia 1970s  ? Obstructive sleep apnea   ? positive from a home test, still needs to get another study done  ? Persistent atrial fibrillation ([facility])   ? s/p clipping of LA appendage 10/04/16  ? Right carotid bruit 12/12/2018  ? Right foot  drop 11/07/2018  ? Stroke [facility]) 12/12/2018  ? ? ?Past Surgical History:  ?Procedure Laterality Date  ? ANTERIOR LAT LUMBAR FUSION N/A 07/17/2019  ? Procedure: Lumbar one-two Lumbar two-three Lumbar three-four Anterolateral lumbar interbody fusion with posterior fixation Lumbar one to Lumbar four with Mazor;  Surgeon: Kristeen Miss, MD;  Location: [place];  Service: Neurosurgery;  Laterality: N/A;  ? AORTIC VALVE REPLACEMENT N/A 10/04/2016  ? Procedure: AORTIC VALVE REPLACEMENT (AVR);  Surgeon: Gaye Pollack, MD;  Location: [place];  Service: Open Heart Surgery;  Laterality: N/A;  ? APPENDECTOMY    ? APPLICATION OF ROBOTIC ASSISTANCE FOR SPINAL PROCEDURE N/A 07/17/2019  ? Procedure: APPLICATION OF ROBOTIC ASSISTANCE FOR SPINAL PROCEDURE;  Surgeon: Kristeen Miss, MD;  Location: [place];  Service: Neurosurgery;  Laterality: N/A;  ? BACK SURGERY    ? CARDIAC CATHETERIZATION N/A 03/10/2016  ? Procedure: Right/Left Heart Cath and Coronary Angiography;  Surgeon: Sherren Mocha, MD;  Location: [place] [facility];  Service: Cardiovascular;  Laterality: N/A;  ? CERVICAL SPINE SURGERY    ? 07/13/2019: per patient 2020  ? CLIPPING OF ATRIAL APPENDAGE N/A 10/04/2016  ? Procedure: CLIPPING OF ATRIAL APPENDAGE;  Surgeon: Gaye Pollack, MD;  Location: [place];  Service: Open Heart Surgery;  Laterality: N/A;  ? COLONOSCOPY    ? EYE SURGERY Right   ? growth removed from eye  ? HERNIA REPAIR Bilateral   ? inguinal  ? LUMBAR PERCUTANEOUS PEDICLE SCREW 3 LEVEL N/A  07/17/2019  ? Procedure: Posterior fixation Lumbar 1 to Lumbar 4;  Surgeon: Kristeen Miss, MD;  Location: [place];  Service: Neurosurgery;  Laterality: N/A;  ? TEE WITHOUT CARDIOVERSION N/A 10/04/2016  ? Procedure: TRANSESOPHAGEAL ECHOCARDIOGRAM (TEE);  Surgeon: Gaye Pollack, MD;  Location: [place];  Service: Open Heart Surgery;  Laterality: N/A;  ? ? ?History reviewed. No pertinent family history. ? ?Social History  ? ?Socioeconomic History  ? Marital status: Married  ?  Spouse name: Not on  file  ? Number of children: Not on file  ? Years of education: Not on file  ? Highest education level: Not on file  ?Occupational History  ? Not on file  ?Tobacco Use  ? Smoking status: Former  ?  Types: Cigars  ?  Quit date: 09/16/2016  ?  Years since quitting: 4.9  ? Smokeless tobacco: Never  ?Vaping Use  ? Vaping Use: Never used  ?Substance and Sexual Activity  ? Alcohol use: Yes  ?  Comment: very rare  ? Drug use: No  ? Sexual activity: Not on file  ?Other Topics Concern  ? Not on file  ?Social History Narrative  ? 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 ?Social Determinants of Health  ? ?Financial Resource Strain: Not on file  ?Food Insecurity: Not on file  ?Transportation Needs: Not on file  ?Physical Activity: Not on file  ?Stress: Not on file  ?Social Connections: Not on file  ?Intimate Partner Violence: Not on file  ? ? ?Allergies  ?Allergen Reactions  ? Penicillins Itching, Rash and Other (See Comments)  ?  All over body ?Did it involve swelling of the face/tongue/throat, SOB, or low BP? No ?Did it involve sudden or severe rash/hives, skin peeling, or any reaction on the inside of your mouth or nose? Yes ?Did you need to seek medical attention at a hospital or doctor's office? No ?When did it last happen?      20 + years ?If all above answers are "NO", may proceed with cephalosporin use. ?  ? Atorvastatin   ?  Other reaction(s): myalgia  ? Ketoconazole   ?  Other reaction(s): blisters  ? Sulfa Antibiotics Rash  ?  Rash  ? ? ?Current Outpatient Medications  ?Medication Sig Dispense Refill  ? acetaminophen (TYLENOL) 325 MG tablet Take 2 tablets (650 mg total) by mouth every 6 (six) hours as needed for mild pain.    ? ALPRAZolam (XANAX) 1 MG tablet Take 1-2 tablets 30 mins before MRI, one additional tablet can be used when MRI study starts. MUST have Driver 3 tablet 0  ? amLODipine (NORVASC) 5 MG tablet TAKE 1 TABLET(5 MG) BY MOUTH DAILY. FOLLOW UP APPOINTMENT 90 tablet 0  ? azithromycin (ZITHROMAX) 250 MG tablet  azithromycin 250 mg tablet    ? B Complex-C (B-COMPLEX WITH VITAMIN C) tablet Take 1 tablet by mouth 3 (three) times a week.    ? Baclofen 5 MG TABS baclofen 5 mg tablet    ? Cholecalciferol (VITAMIN D3) 250 MCG (10000 UT) capsule Take 10,000 Units by mouth 3 (three) times a week.    ? clindamycin (CLEOCIN T) 1 % external solution Apply topically 2 (two) times daily. 60 mL 2  ? dexamethasone (DECADRON) 2 MG tablet dexamethasone 2 mg tablet    ? dicyclomine (BENTYL) 10 MG capsule dicyclomine 10 mg capsule    ? Dulaglutide (TRULICITY) 1.5 0000000 SOPN Inject 1.5 mg into the skin every Sunday.     ?  ELIQUIS 5 MG TABS tablet TAKE 1 TABLET(5 MG) BY MOUTH TWICE DAILY 180 tablet 1  ? empagliflozin (JARDIANCE) 25 MG TABS tablet Take 12.5 mg by mouth daily.    ? furosemide (LASIX) 20 MG tablet TAKE 1 TABLET BY MOUTH DAILY AS NEEDED FOR SWELLING 90 tablet 3  ? gabapentin (NEURONTIN) 300 MG capsule gabapentin 300 mg capsule    ? glimepiride (AMARYL) 4 MG tablet Take 4 mg by mouth 2 (two) times daily.     ? hydroxyurea (HYDREA) 500 MG capsule hydroxyurea 500 mg capsule    ? losartan (COZAAR) 100 MG tablet Take 100 mg by mouth daily.    ? meclizine (ANTIVERT) 25 MG tablet Take 25 mg by mouth 2 (two) times daily as needed (dizziness/vertigo).     ? metFORMIN (GLUCOPHAGE-XR) 500 MG 24 hr tablet Take 1,000 mg by mouth 2 (two) times daily.    ? metoprolol tartrate (LOPRESSOR) 25 MG tablet Take 1 tablet (25 mg total) by mouth 2 (two) times daily. 180 tablet 3  ? molnupiravir EUA (LAGEVRIO) 200 MG CAPS capsule Lagevrio 200 mg capsule (EUA)    ? omeprazole (PRILOSEC) 20 MG capsule Take 20 mg by mouth 2 (two) times daily as needed.    ? oxyCODONE-acetaminophen (PERCOCET/ROXICET) 5-325 MG tablet Take 1-2 tablets by mouth every 3 (three) hours as needed for moderate pain or severe pain. 60 tablet 0  ? pyridostigmine (MESTINON) 60 MG tablet pyridostigmine bromide 60 mg tablet    ? RELYVRIO 3-1 g PACK Take by mouth.    ? riluzole (RILUTEK)  50 MG tablet Take 50 mg by mouth 2 (two) times daily.    ? TOUJEO SOLOSTAR 300 UNIT/ML Solostar Pen Inject 35 Units into the skin daily.     ? traMADol (ULTRAM) 50 MG tablet Take 1 tablet (50 mg total) by mouth

## 2021-08-12 ENCOUNTER — Encounter: Payer: Self-pay | Admitting: Urology

## 2021-08-12 ENCOUNTER — Ambulatory Visit (INDEPENDENT_AMBULATORY_CARE_PROVIDER_SITE_OTHER): Payer: Medicare Other | Admitting: Urology

## 2021-08-12 VITALS — BP 117/72 | HR 64

## 2021-08-12 DIAGNOSIS — N138 Other obstructive and reflux uropathy: Secondary | ICD-10-CM

## 2021-08-12 DIAGNOSIS — R339 Retention of urine, unspecified: Secondary | ICD-10-CM | POA: Diagnosis not present

## 2021-08-12 DIAGNOSIS — N401 Enlarged prostate with lower urinary tract symptoms: Secondary | ICD-10-CM

## 2021-08-12 MED ORDER — ALFUZOSIN HCL ER 10 MG PO TB24: 10.0000 mg | ORAL_TABLET | Freq: Every evening | ORAL | 11 refills | Status: AC

## 2021-08-12 NOTE — Patient Instructions (Signed)
Acute Urinary Retention, Male  Acute urinary retention is a condition in which a person is unable to pass urine or can only pass a little urine. This condition can happen suddenly and last for a short time. If left untreated, it can become long-term (chronic) and result in kidney damage or other serious complications. What are the causes? This condition may be caused by: Obstruction or narrowing of the tube that drains the bladder (urethra). This may be caused by surgery, problems with nearby organs, or injury to the bladder or urethra. Problems with the nerves in the bladder. Tumors in the area of the pelvis, bladder, or urethra. Certain medicines. Bladder or urinary tract infection. Constipation. What increases the risk? This condition is more likely to develop in older men. As men age, their prostate may become larger and may start to press or squeeze on the bladder or the urethra. Other chronic health conditions can increase the risk of acute urinary retention. These include: Diseases such as multiple sclerosis. Spinal cord injuries. Diabetes. Degenerative cognitive conditions, such as delirium or dementia. Psychological conditions. A man may hold his urine due to trauma or because he does not want to use the bathroom. What are the signs or symptoms? Symptoms of this condition include: Trouble urinating. Pain in the lower abdomen. How is this diagnosed? This condition is diagnosed based on a physical exam and your medical history. You may also have other tests, including: An ultrasound of the bladder or kidneys or both. Blood tests. A urine analysis. Additional tests may be needed, such as a CT scan, MRI, and kidney or bladder function tests. How is this treated? Treatment for this condition may include: Medicines. Placing a thin, sterile tube (catheter) into the bladder to drain urine out of the body. This is called an indwelling urinary catheter. After it is inserted, the  catheter is held in place with a small balloon that is filled with sterile water. Urine drains from the catheter into a collection bag outside of the body. Behavioral therapy. Treatment for other conditions. If needed, you may be treated in the hospital for kidney function problems or to manage other complications. Follow these instructions at home: Medicines Take over-the-counter and prescription medicines only as told by your health care provider. Avoid certain medicines, such as decongestants, antihistamines, and some prescription medicines. Do not take any medicine unless your health care provider approves. If you were prescribed an antibiotic medicine, take it as told by your health care provider. Do not stop using the antibiotic even if you start to feel better. General instructions Do not use any products that contain nicotine or tobacco. These products include cigarettes, chewing tobacco, and vaping devices, such as e-cigarettes. If you need help quitting, ask your health care provider. Drink enough fluid to keep your urine pale yellow. If you have an indwelling urinary catheter, follow the instructions from your health care provider. Monitor any changes in your symptoms. Tell your health care provider about any changes. If instructed, monitor your blood pressure at home. Report changes as told by your health care provider. Keep all follow-up visits. This is important. Contact a health care provider if: You have uncomfortable bladder contractions that you cannot control (spasms). You leak urine with the spasms. Get help right away if: You have chills or a fever. You have blood in your urine. You have a catheter and the following happens: Your catheter stops draining urine. Your catheter falls out. Summary Acute urinary retention is a condition in   which a person is unable to pass urine or can only pass a little urine. If left untreated, this condition can result in kidney damage or  other serious complications. An enlarged prostate may cause this condition. As men age, their prostate gland may become larger and may press or squeeze on the bladder or the urethra. Treatment for this condition may include medicines and placement of an indwelling urinary catheter. Monitor any changes in your symptoms. Tell your health care provider about any changes. This information is not intended to replace advice given to you by your health care provider. Make sure you discuss any questions you have with your health care provider. Document Revised: 12/19/2019 Document Reviewed: 12/19/2019 Elsevier Patient Education  2023 [employer]  

## 2021-08-12 NOTE — Progress Notes (Signed)
? ?08/12/2021 ?9:20 AM  ? ?Vernon Ruiz ?1950/04/27 ?[id number] ? ?Referring provider: Soundra Pilon, FNP ?[address] ?[place],  [place] [address] ? ?Urinary retention ? ? ?HPI: ?Vernon Ruiz is a 71yo here for evaluation of urinary retention. He has a hx of ALS. On 07/31/2021 he presented to the ER with a 1 day history of a weaker urinary stream and increased nocturia. A foley catheter was placed and 1L drained. NO alpha blocker was started. IPSS 20 QOL 4. He has urinary urgency with the foley in place. Patient in a wheelchair. ? ? ?PMH: ?Past Medical History:  ?Diagnosis Date  ? Aortic stenosis   ? s/p AVR using a 30mm Edwards Magna-Ease pericardial valve 10/04/16  ? Coronary artery disease   ? Diabetes mellitus without complication (HCC)   ? GERD (gastroesophageal reflux disease)   ? Heart murmur   ? Hypertension   ? Inguinal hernia 1970s  ? Obstructive sleep apnea   ? positive from a home test, still needs to get another study done  ? Persistent atrial fibrillation (HCC)   ? s/p clipping of LA appendage 10/04/16  ? Right carotid bruit 12/12/2018  ? Right foot drop 11/07/2018  ? Stroke [facility]) 12/12/2018  ? ? ?Surgical History: ?Past Surgical History:  ?Procedure Laterality Date  ? ANTERIOR LAT LUMBAR FUSION N/A 07/17/2019  ? Procedure: Lumbar one-two Lumbar two-three Lumbar three-four Anterolateral lumbar interbody fusion with posterior fixation Lumbar one to Lumbar four with Mazor;  Surgeon: [name], MD;  Location: [facility] OR;  Service: Neurosurgery;  Laterality: N/A;  ? AORTIC VALVE REPLACEMENT N/A 10/04/2016  ? Procedure: AORTIC VALVE REPLACEMENT (AVR);  Surgeon: [name], MD;  Location: [facility] OR;  Service: Open Heart Surgery;  Laterality: N/A;  ? APPENDECTOMY    ? APPLICATION OF ROBOTIC ASSISTANCE FOR SPINAL PROCEDURE N/A 07/17/2019  ? Procedure: APPLICATION OF ROBOTIC ASSISTANCE FOR SPINAL PROCEDURE;  Surgeon: [name], MD;  Location: [facility] OR;  Service: Neurosurgery;  Laterality: N/A;  ? BACK SURGERY    ?  CARDIAC CATHETERIZATION N/A 03/10/2016  ? Procedure: Right/Left Heart Cath and Coronary Angiography;  Surgeon: [name], MD;  Location: [facility] INVASIVE CV LAB;  Service: Cardiovascular;  Laterality: N/A;  ? CERVICAL SPINE SURGERY    ? 07/13/2019: per patient 2020  ? CLIPPING OF ATRIAL APPENDAGE N/A 10/04/2016  ? Procedure: CLIPPING OF ATRIAL APPENDAGE;  Surgeon: [name], MD;  Location: [facility] OR;  Service: Open Heart Surgery;  Laterality: N/A;  ? COLONOSCOPY    ? EYE SURGERY Right   ? growth removed from eye  ? HERNIA REPAIR Bilateral   ? inguinal  ? LUMBAR PERCUTANEOUS PEDICLE SCREW 3 LEVEL N/A 07/17/2019  ? Procedure: Posterior fixation Lumbar 1 to Lumbar 4;  Surgeon: [name], MD;  Location: [facility] OR;  Service: Neurosurgery;  Laterality: N/A;  ? TEE WITHOUT CARDIOVERSION N/A 10/04/2016  ? Procedure: TRANSESOPHAGEAL ECHOCARDIOGRAM (TEE);  Surgeon: [name], MD;  Location: [facility] OR;  Service: Open Heart Surgery;  Laterality: N/A;  ? ? ?Home Medications:  ?Allergies as of 08/12/2021   ? ?   Reactions  ? Penicillins Itching, Rash, Other (See Comments)  ? All over body ?Did it involve swelling of the face/tongue/throat, SOB, or low BP? No ?Did it involve sudden or severe rash/hives, skin peeling, or any reaction on the inside of your mouth or nose? Yes ?Did you need to seek medical attention at a hospital or doctor's office? No ?When did it  last happen?      20 + years ?If all above answers are "NO", may proceed with cephalosporin use.  ? Atorvastatin   ? Other reaction(s): myalgia  ? Crestor [rosuvastatin] Other (See Comments)  ? Ketoconazole   ? Other reaction(s): blisters  ? Sulfa Antibiotics Rash  ? Rash  ? ?  ? ?  ?Medication List  ?  ? ?  ? Accurate as of Aug 12, 2021  9:20 AM. If you have any questions, ask your nurse or doctor.  ?  ?  ? ?  ? ?acetaminophen 325 MG tablet ?Commonly known as: TYLENOL ?Take 2 tablets (650 mg total) by mouth every 6 (six) hours as needed for mild pain. ?  ?ALPRAZolam 1 MG  tablet ?Commonly known as: Prudy Feeler ?Take 1-2 tablets 30 mins before MRI, one additional tablet can be used when MRI study starts. MUST have Driver ?  ?amLODipine 5 MG tablet ?Commonly known as: NORVASC ?TAKE 1 TABLET(5 MG) BY MOUTH DAILY. FOLLOW UP APPOINTMENT ?  ?azithromycin 250 MG tablet ?Commonly known as: ZITHROMAX ?azithromycin 250 mg tablet ?  ?B-complex with vitamin C tablet ?Take 1 tablet by mouth 3 (three) times a week. ?  ?Baclofen 5 MG Tabs ?baclofen 5 mg tablet ?  ?clindamycin 1 % external solution ?Commonly known as: CLEOCIN T ?Apply topically 2 (two) times daily. ?  ?dexamethasone 2 MG tablet ?Commonly known as: DECADRON ?dexamethasone 2 mg tablet ?  ?dicyclomine 10 MG capsule ?Commonly known as: BENTYL ?dicyclomine 10 mg capsule ?  ?Eliquis 5 MG Tabs tablet ?Generic drug: apixaban ?TAKE 1 TABLET(5 MG) BY MOUTH TWICE DAILY ?  ?famotidine 20 MG tablet ?Commonly known as: PEPCID ?Take 1 tablet (20 mg total) by mouth 2 (two) times daily for 15 days. ?What changed:  ?when to take this ?reasons to take this ?  ?furosemide 20 MG tablet ?Commonly known as: LASIX ?TAKE 1 TABLET BY MOUTH DAILY AS NEEDED FOR SWELLING ?  ?gabapentin 300 MG capsule ?Commonly known as: NEURONTIN ?gabapentin 300 mg capsule ?  ?glimepiride 4 MG tablet ?Commonly known as: AMARYL ?Take 4 mg by mouth 2 (two) times daily. ?  ?hydroxyurea 500 MG capsule ?Commonly known as: HYDREA ?hydroxyurea 500 mg capsule ?  ?Jardiance 25 MG Tabs tablet ?Generic drug: empagliflozin ?Take 12.5 mg by mouth daily. ?  ?Lagevrio 200 MG Caps capsule ?Generic drug: molnupiravir EUA ?Lagevrio 200 mg capsule (EUA) ?  ?losartan 100 MG tablet ?Commonly known as: COZAAR ?Take 100 mg by mouth daily. ?  ?losartan 50 MG tablet ?Commonly known as: COZAAR ?Take 50 mg by mouth 2 (two) times daily. ?  ?meclizine 25 MG tablet ?Commonly known as: ANTIVERT ?Take 25 mg by mouth 2 (two) times daily as needed (dizziness/vertigo). ?  ?metFORMIN 500 MG 24 hr tablet ?Commonly known  as: GLUCOPHAGE-XR ?Take 1,000 mg by mouth 2 (two) times daily. ?  ?metoprolol tartrate 25 MG tablet ?Commonly known as: LOPRESSOR ?Take 1 tablet (25 mg total) by mouth 2 (two) times daily. ?  ?omeprazole 20 MG capsule ?Commonly known as: PRILOSEC ?Take 20 mg by mouth 2 (two) times daily as needed. ?  ?oxyCODONE-acetaminophen 5-325 MG tablet ?Commonly known as: PERCOCET/ROXICET ?Take 1-2 tablets by mouth every 3 (three) hours as needed for moderate pain or severe pain. ?  ?pyridostigmine 60 MG tablet ?Commonly known as: MESTINON ?pyridostigmine bromide 60 mg tablet ?  ?Relyvrio 3-1 g Pack ?Generic drug: Phenylbutyrate-Taurursodiol ?Take by mouth. ?  ?riluzole 50 MG tablet ?Commonly known as:  RILUTEK ?Take 50 mg by mouth 2 (two) times daily. ?  ?rosuvastatin 20 MG tablet ?Commonly known as: CRESTOR ?Take 1 tablet (20 mg total) by mouth daily. ?  ?Toujeo SoloStar 300 UNIT/ML Solostar Pen ?Generic drug: insulin glargine (1 Unit Dial) ?Inject 35 Units into the skin daily. ?  ?traMADol 50 MG tablet ?Commonly known as: ULTRAM ?Take 1 tablet (50 mg total) by mouth 3 (three) times daily as needed. ?  ?Trulicity 1.5 MG/0.5ML Sopn ?Generic drug: Dulaglutide ?Inject 1.5 mg into the skin every "Sunday. ?  ?vitamin C 500 MG tablet ?Commonly known as: ASCORBIC ACID ?Take 1,500 mg by mouth 3 (three) times a week. ?  ?Vitamin D3 250 MCG (10000 UT) capsule ?Take 10,000 Units by mouth 3 (three) times a week. ?  ?zinc gluconate 50 MG tablet ?Take 50 mg by mouth daily. ?  ? ?  ? ? ?Allergies:  ?Allergies  ?Allergen Reactions  ? Penicillins Itching, Rash and Other (See Comments)  ?  All over body ?Did it involve swelling of the face/tongue/throat, SOB, or low BP? No ?Did it involve sudden or severe rash/hives, skin peeling, or any reaction on the inside of your mouth or nose? Yes ?Did you need to seek medical attention at a hospital or doctor's office? No ?When did it last happen?      20"  + years ?If all above answers are "NO", may  proceed with cephalosporin use. ?  ? Atorvastatin   ?  Other reaction(s): myalgia  ? Crestor [Rosuvastatin] Other (See Comments)  ? Ketoconazole   ?  Other reaction(s): blisters  ? Sulfa Antibiotics Rash  ?  Rash  ? ?

## 2021-08-18 ENCOUNTER — Ambulatory Visit (INDEPENDENT_AMBULATORY_CARE_PROVIDER_SITE_OTHER): Payer: Medicare Other | Admitting: Physician Assistant

## 2021-08-18 VITALS — BP 119/66 | HR 83 | Ht 65.0 in | Wt 175.0 lb

## 2021-08-18 DIAGNOSIS — G1221 Amyotrophic lateral sclerosis: Secondary | ICD-10-CM

## 2021-08-18 DIAGNOSIS — R339 Retention of urine, unspecified: Secondary | ICD-10-CM | POA: Diagnosis not present

## 2021-08-18 DIAGNOSIS — N401 Enlarged prostate with lower urinary tract symptoms: Secondary | ICD-10-CM

## 2021-08-18 DIAGNOSIS — N138 Other obstructive and reflux uropathy: Secondary | ICD-10-CM | POA: Diagnosis not present

## 2021-08-18 NOTE — Progress Notes (Signed)
? ?Assessment: ?1. Urinary retention ?- Ambulatory referral to Urology ? ?2. ALS (amyotrophic lateral sclerosis) ([place]) ?- Ambulatory referral to Urology ? ?3. Benign prostatic hyperplasia with urinary obstruction ?  ? ?Plan: ?Pt did not pass voiding trial today. Discussed presentation with Dr. Alyson Ingles whose previous recommendation was scheduling urodynamics. The pt is referred to [facility] for Urodynamics and referral ordered. FU in one month to discuss results and possible VT vs foley exchange. Pt will continue Uroxatrol until FU. Discussed association of retention and bladder dysfunction with ALS. He is aware that long term foley placement may be indicated. ? ?Chief Complaint: ?Recheck retention ? ?HPI: ?Vernon Ruiz is a 71 y.o. male with advanced stage ALS who presents for continued evaluation of recent onset of urinary retention. Pt has been on uroxatrol since visit last week and tolerating well. He has recently been placed on new ALS meds which have caused significant diffuse extremity edema. He is hoping to DC his FC today ? ?08/12/21 ?Vernon Ruiz is a 71yo here for evaluation of urinary retention. He has a hx of ALS. On 07/31/2021 he presented to the ER with a 1 day history of a weaker urinary stream and increased nocturia. A foley catheter was placed and 1L drained. NO alpha blocker was started. IPSS 20 QOL 4. He has urinary urgency with the foley in place. Patient in a permanent motorized wheelchair. ? ?Portions of the above documentation were copied from a prior visit for review purposes only. ? ?Allergies: ?Allergies  ?Allergen Reactions  ? Penicillins Itching, Rash and Other (See Comments)  ?  All over body ?Did it involve swelling of the face/tongue/throat, SOB, or low BP? No ?Did it involve sudden or severe rash/hives, skin peeling, or any reaction on the inside of your mouth or nose? Yes ?Did you need to seek medical attention at a hospital or doctor's office? No ?When did it last happen?      20 +  years ?If all above answers are "NO", may proceed with cephalosporin use. ?  ? Atorvastatin   ?  Other reaction(s): myalgia  ? Crestor [Rosuvastatin] Other (See Comments)  ? Ketoconazole   ?  Other reaction(s): blisters  ? Sulfa Antibiotics Rash  ?  Rash  ? ? ?PMH: ?Past Medical History:  ?Diagnosis Date  ? Aortic stenosis   ? s/p AVR using a 41mm Edwards Magna-Ease pericardial valve 10/04/16  ? Coronary artery disease   ? Diabetes mellitus without complication ([place])   ? GERD (gastroesophageal reflux disease)   ? Heart murmur   ? Hypertension   ? Inguinal hernia 1970s  ? Obstructive sleep apnea   ? positive from a home test, still needs to get another study done  ? Persistent atrial fibrillation (HCC)   ? s/p clipping of LA appendage 10/04/16  ? Right carotid bruit 12/12/2018  ? Right foot drop 11/07/2018  ? Stroke [facility]) 12/12/2018  ? ? ?PSH: ?Past Surgical History:  ?Procedure Laterality Date  ? ANTERIOR LAT LUMBAR FUSION N/A 07/17/2019  ? Procedure: Lumbar one-two Lumbar two-three Lumbar three-four Anterolateral lumbar interbody fusion with posterior fixation Lumbar one to Lumbar four with Mazor;  Surgeon: Kristeen Miss, MD;  Location: [place];  Service: Neurosurgery;  Laterality: N/A;  ? AORTIC VALVE REPLACEMENT N/A 10/04/2016  ? Procedure: AORTIC VALVE REPLACEMENT (AVR);  Surgeon: Gaye Pollack, MD;  Location: [place];  Service: Open Heart Surgery;  Laterality: N/A;  ? APPENDECTOMY    ? APPLICATION OF ROBOTIC ASSISTANCE FOR  SPINAL PROCEDURE N/A 07/17/2019  ? Procedure: APPLICATION OF ROBOTIC ASSISTANCE FOR SPINAL PROCEDURE;  Surgeon: Kristeen Miss, MD;  Location: [place];  Service: Neurosurgery;  Laterality: N/A;  ? BACK SURGERY    ? CARDIAC CATHETERIZATION N/A 03/10/2016  ? Procedure: Right/Left Heart Cath and Coronary Angiography;  Surgeon: Sherren Mocha, MD;  Location: [place] CV LAB;  Service: Cardiovascular;  Laterality: N/A;  ? CERVICAL SPINE SURGERY    ? 07/13/2019: per patient 2020  ? CLIPPING OF ATRIAL APPENDAGE  N/A 10/04/2016  ? Procedure: CLIPPING OF ATRIAL APPENDAGE;  Surgeon: Gaye Pollack, MD;  Location: [place];  Service: Open Heart Surgery;  Laterality: N/A;  ? COLONOSCOPY    ? EYE SURGERY Right   ? growth removed from eye  ? HERNIA REPAIR Bilateral   ? inguinal  ? LUMBAR PERCUTANEOUS PEDICLE SCREW 3 LEVEL N/A 07/17/2019  ? Procedure: Posterior fixation Lumbar 1 to Lumbar 4;  Surgeon: Kristeen Miss, MD;  Location: [place];  Service: Neurosurgery;  Laterality: N/A;  ? TEE WITHOUT CARDIOVERSION N/A 10/04/2016  ? Procedure: TRANSESOPHAGEAL ECHOCARDIOGRAM (TEE);  Surgeon: Gaye Pollack, MD;  Location: [place];  Service: Open Heart Surgery;  Laterality: N/A;  ? ? ?SH: ?Social History  ? ?Tobacco Use  ? Smoking status: Former  ?  Types: Cigars  ?  Quit date: 09/16/2016  ?  Years since quitting: 4.9  ? Smokeless tobacco: Never  ?Vaping Use  ? Vaping Use: Never used  ?Substance Use Topics  ? Alcohol use: Yes  ?  Comment: very rare  ? Drug use: No  ? ? ?ROS: ?Constitutional:  Negative for fever, chills, weight loss ?CV: Negative for chest pain ?Respiratory:  Negative for shortness of breath, wheezing, sleep apnea, frequent cough ?GI:  Negative for nausea, vomiting, bloody stool, GERD ? ?PE: ?BP 119/66   Pulse 83   Ht 5\' 5"  (1.651 m)   Wt 175 lb (79.4 kg)   BMI 29.12 kg/m?  ?GENERAL APPEARANCE:  Well appearing, well developed, well nourished, NAD ?HEENT:  Atraumatic, normocephalic ?NECK:  Supple. Trachea midline ?ABDOMEN:  Soft, non-tender, no masses ?EXTREMITIES:  Moderate LE edema, pt in motorized wheelchair ?NEUROLOGIC:  Alert and oriented x 3 ?MENTAL STATUS:  appropriate ?BACK:  Non-tender to palpation, No CVAT ?SKIN:  Warm, dry, and intact ? ? ?Results: ?Laboratory Data: ?Lab Results  ?Component Value Date  ? WBC 10.6 (H) 07/31/2021  ? HGB 15.5 07/31/2021  ? HCT 48.7 07/31/2021  ? MCV 82.0 07/31/2021  ? PLT 257 07/31/2021  ? ? ?Lab Results  ?Component Value Date  ? CREATININE 0.58 (L) 07/31/2021  ? ? ?Lab Results  ?Component  Value Date  ? HGBA1C 7.7 (H) 07/13/2019  ? ? ?Urinalysis ?   ?Component Value Date/Time  ? [place] 07/31/2021 1005  ? APPEARANCEUR CLEAR 07/31/2021 1005  ? LABSPEC 1.012 07/31/2021 1005  ? PHURINE 7.0 07/31/2021 1005  ? GLUCOSEU >1,000 (A) 07/31/2021 1005  ? HGBUR TRACE (A) 07/31/2021 1005  ? [place] NEGATIVE 07/31/2021 1005  ? [place] NEGATIVE 07/31/2021 1005  ? PROTEINUR 30 (A) 07/31/2021 1005  ? NITRITE NEGATIVE 07/31/2021 1005  ? LEUKOCYTESUR NEGATIVE 07/31/2021 1005  ? ? ?Lab Results  ?Component Value Date  ? BACTERIA NONE SEEN 09/30/2016  ? ? ?Pertinent Imaging: ? ? ?No results found for this or any previous visit (from the past 24 hour(s)).  ?

## 2021-08-18 NOTE — Patient Instructions (Addendum)
Continue Uroxatrol daily ?

## 2021-08-18 NOTE — Progress Notes (Signed)
Fill and Pull Catheter Removal ? ?Patient is present today for a catheter removal.  Patient was cleaned and prepped in a sterile fashion 125ml of sterile water/ saline was instilled into the bladder when the patient felt the urge to urinate. 47ml of water was then drained from the balloon.  A 16FR foley cath was removed from the bladder no complications were noted .  Patient as then given some time to void on their own.  Patient cannot void on their own after some time.  Patient tolerated well. ? ?Performed by: Levi Aland, CMA ? ?Follow up/ Additional notes: Follow up as scheduled.   ? ?Simple Catheter Placement ? ?Due to urinary retention patient is present today for a foley cath placement.  Patient was cleaned and prepped in a sterile fashion with betadine. A 16 FR foley catheter was inserted, urine return was noted  124ml, urine was yellow in color.  The balloon was filled with 10cc of sterile water.  A bed bag was attached for drainage. Patient was also given a night bag to take home and was given instruction on how to change from one bag to another.  Patient was given instruction on proper catheter care.  Patient tolerated well, no complications were noted  ? ?Performed by: Levi Aland, CMA ? ?Additional notes/ Follow up: Follow up as scheduled.   ?

## 2021-08-21 ENCOUNTER — Ambulatory Visit: Payer: Medicare Other | Admitting: Cardiovascular Disease

## 2021-08-24 ENCOUNTER — Ambulatory Visit: Payer: Medicare Other | Admitting: Podiatry

## 2021-09-01 ENCOUNTER — Encounter: Payer: Self-pay | Admitting: Podiatry

## 2021-09-01 ENCOUNTER — Ambulatory Visit (INDEPENDENT_AMBULATORY_CARE_PROVIDER_SITE_OTHER): Payer: Medicare Other | Admitting: Podiatry

## 2021-09-01 DIAGNOSIS — D689 Coagulation defect, unspecified: Secondary | ICD-10-CM | POA: Diagnosis not present

## 2021-09-01 DIAGNOSIS — E1151 Type 2 diabetes mellitus with diabetic peripheral angiopathy without gangrene: Secondary | ICD-10-CM | POA: Diagnosis not present

## 2021-09-01 DIAGNOSIS — E1169 Type 2 diabetes mellitus with other specified complication: Secondary | ICD-10-CM

## 2021-09-01 DIAGNOSIS — B351 Tinea unguium: Secondary | ICD-10-CM

## 2021-09-01 NOTE — Progress Notes (Signed)
This patient returns to my office for at risk foot care.  This patient requires this care by a professional since this patient will be at risk due to having coagulation defect due to eliquis.  This patient is unable to cut nails himself since the patient cannot reach his nails.These nails are painful walking and wearing shoes.  Patient presents to the office with his brother in a wheelchair.  This patient presents for at risk foot care today.  General Appearance  Alert, conversant and in no acute stress.  Vascular  Dorsalis pedis and posterior tibial  pulses are not  palpable  due to swelling. bilaterally.  Capillary return is within normal limits  bilaterally. Temperature is within normal limits  bilaterally.  Neurologic  Senn-Weinstein monofilament wire test within normal limits  bilaterally. Muscle power within normal limits bilaterally.  Nails Thick disfigured discolored nails with subungual debris  from hallux to fifth toes bilaterally. No evidence of bacterial infection or drainage bilaterally.  Orthopedic  No limitations of motion  feet .  No crepitus or effusions noted.  No bony pathology or digital deformities noted  Foot drop right foot.  Skin  normotropic skin with no porokeratosis noted bilaterally.  No signs of infections or ulcers noted.     Onychomycosis  Pain in right toes  Pain in left toes  Consent was obtained for treatment procedures.   Mechanical debridement of nails 1-5  bilaterally performed with a nail nipper.  Filed with dremel without incident.    Return office visit   3 months                  Told patient to return for periodic foot care and evaluation due to potential at risk complications.   Gardiner Barefoot DPM

## 2021-09-11 ENCOUNTER — Telehealth: Payer: Self-pay

## 2021-09-11 NOTE — Telephone Encounter (Signed)
Patient called advising he wanted to speak to Dr. Alyson Ingles. I advised patient that he was in with patients currently.  HE advised that he needed to speak to someone regarding why they were unable to complete his MRI yesterday. He advised that he went to the facility and they advised they were unable to complete it. I donut see in the canceled imaging orders an order from [facility] or an order from yesterdays date.

## 2021-09-13 ENCOUNTER — Other Ambulatory Visit: Payer: Self-pay | Admitting: Cardiovascular Disease

## 2021-09-14 ENCOUNTER — Telehealth: Payer: Self-pay | Admitting: Podiatry

## 2021-09-14 DIAGNOSIS — G8929 Other chronic pain: Secondary | ICD-10-CM

## 2021-09-14 NOTE — Telephone Encounter (Signed)
Patient cancelled his appt for 09/17/21 he said he is not well enough to travel.  He was to come in to get a referral for Pain Management, he would like the nurse to call him with the referral for this instead of coming into office.

## 2021-09-14 NOTE — Telephone Encounter (Signed)
Patient did not have urodynamics completed [facility] and they advised Urodynamics procedure not completed due to safety concerns not able to get patient in table"   Patient is requesting catheter removal.

## 2021-09-15 NOTE — Telephone Encounter (Signed)
Referral sent for pain management

## 2021-09-15 NOTE — Telephone Encounter (Signed)
Prescription refill request for Eliquis received. Indication: PAF Last office visit: 05/27/20  B Bhagat PA Age:  71 Weight: 84.8kg  Based on above findings Eliquis 5mg  twice daily is the appropriate dose.  Refill approved till time of appt.. Pt is past due to see MD but has appt scheduled for 12/08/21.

## 2021-09-16 ENCOUNTER — Ambulatory Visit (INDEPENDENT_AMBULATORY_CARE_PROVIDER_SITE_OTHER): Payer: Medicare Other | Admitting: Physician Assistant

## 2021-09-16 VITALS — BP 118/72 | HR 85 | Ht 65.0 in | Wt 174.0 lb

## 2021-09-16 DIAGNOSIS — R339 Retention of urine, unspecified: Secondary | ICD-10-CM | POA: Diagnosis not present

## 2021-09-16 DIAGNOSIS — G1221 Amyotrophic lateral sclerosis: Secondary | ICD-10-CM

## 2021-09-16 DIAGNOSIS — N401 Enlarged prostate with lower urinary tract symptoms: Secondary | ICD-10-CM

## 2021-09-16 DIAGNOSIS — N138 Other obstructive and reflux uropathy: Secondary | ICD-10-CM | POA: Diagnosis not present

## 2021-09-16 NOTE — Progress Notes (Signed)
Assessment: 1. Urinary retention  2. Benign prostatic hyperplasia with urinary obstruction  3. ALS (amyotrophic lateral sclerosis) ([place])    Plan: During cath change, the patient was able to void some on his own but feels maintaining the catheter is in his best interest.  Foley changed and tubing secured/covered to prevent contact with pt's skin. FU in 1 months for cath change/possible VT if pt elects to attempt DC of the foley. He may DC uroxatrol unless he plans to remove catheter.  Long discussion concerning his medical conditions and risks of ongoing catheterization.  Chief Complaint: No chief complaint on file.   HPI: Vernon Ruiz is a 71 y.o. male who presents for continued evaluation of urinary retention secondary to BPH and advanced ALS.  Patient was unable to proceed with urodynamics because of his immobility status.  Since his last visit, he is now wheelchair-bound and is even sleeping in the chair because his wife is unable to help him with transfers.  He is very torn as to whether he wants to keep an indwelling catheter because he is unable to keep himself dry with voiding.  He is also developed decubitus on his buttocks which are treated now with home health.  He has remained on the Uroxatrol since his last visit and would very much like to proceed with voiding trial, but ultimately wants to keep the catheter..  08/18/21 Vernon Ruiz is a 71 y.o. male with advanced stage ALS who presents for continued evaluation of recent onset of urinary retention. Pt has been on uroxatrol since visit last week and tolerating well. He has recently been placed on new ALS meds which have caused significant diffuse extremity edema. He is hoping to DC his FC today   08/12/21 Vernon Ruiz is a 71yo here for evaluation of urinary retention. He has a hx of ALS. On 07/31/2021 he presented to the ER with a 1 day history of a weaker urinary stream and increased nocturia. A foley catheter was placed and 1L  drained. NO alpha blocker was started. IPSS 20 QOL 4. He has urinary urgency with the foley in place. Patient in a permanent motorized wheelchair.  Portions of the above documentation were copied from a prior visit for review purposes only.  Allergies: Allergies  Allergen Reactions   Penicillins Itching, Rash and Other (See Comments)    All over body Did it involve swelling of the face/tongue/throat, SOB, or low BP? No Did it involve sudden or severe rash/hives, skin peeling, or any reaction on the inside of your mouth or nose? Yes Did you need to seek medical attention at a hospital or doctor's office? No When did it last happen?      20 + years If all above answers are "NO", may proceed with cephalosporin use.    Atorvastatin     Other reaction(s): myalgia   Crestor [Rosuvastatin] Other (See Comments)   Ketoconazole     Other reaction(s): blisters   Sulfa Antibiotics Rash    Rash    PMH: Past Medical History:  Diagnosis Date   Aortic stenosis    s/p AVR using a 76mm Edwards Magna-Ease pericardial valve 10/04/16   Coronary artery disease    Diabetes mellitus without complication (HCC)    GERD (gastroesophageal reflux disease)    Heart murmur    Hypertension    Inguinal hernia 1970s   Obstructive sleep apnea    positive from a home test, still needs to get another study done  Persistent atrial fibrillation (HCC)    s/p clipping of LA appendage 10/04/16   Right carotid bruit 12/12/2018   Right foot drop 11/07/2018   Stroke ([place]) 12/12/2018    PSH: Past Surgical History:  Procedure Laterality Date   ANTERIOR LAT LUMBAR FUSION N/A 07/17/2019   Procedure: Lumbar one-two Lumbar two-three Lumbar three-four Anterolateral lumbar interbody fusion with posterior fixation Lumbar one to Lumbar four with Mazor;  Surgeon: Kristeen Miss, MD;  Location: [facility];  Service: Neurosurgery;  Laterality: N/A;   AORTIC VALVE REPLACEMENT N/A 10/04/2016   Procedure: AORTIC VALVE REPLACEMENT (AVR);   Surgeon: Gaye Pollack, MD;  Location: [facility];  Service: Open Heart Surgery;  Laterality: N/A;   APPENDECTOMY     APPLICATION OF ROBOTIC ASSISTANCE FOR SPINAL PROCEDURE N/A 07/17/2019   Procedure: APPLICATION OF ROBOTIC ASSISTANCE FOR SPINAL PROCEDURE;  Surgeon: Kristeen Miss, MD;  Location: [facility];  Service: Neurosurgery;  Laterality: N/A;   BACK SURGERY     CARDIAC CATHETERIZATION N/A 03/10/2016   Procedure: Right/Left Heart Cath and Coronary Angiography;  Surgeon: Sherren Mocha, MD;  Location: [facility];  Service: Cardiovascular;  Laterality: N/A;   CERVICAL SPINE SURGERY     07/13/2019: per patient 2020   CLIPPING OF ATRIAL APPENDAGE N/A 10/04/2016   Procedure: CLIPPING OF ATRIAL APPENDAGE;  Surgeon: Gaye Pollack, MD;  Location: [facility];  Service: Open Heart Surgery;  Laterality: N/A;   COLONOSCOPY     EYE SURGERY Right    growth removed from eye   HERNIA REPAIR Bilateral    inguinal   LUMBAR PERCUTANEOUS PEDICLE SCREW 3 LEVEL N/A 07/17/2019   Procedure: Posterior fixation Lumbar 1 to Lumbar 4;  Surgeon: Kristeen Miss, MD;  Location: [facility];  Service: Neurosurgery;  Laterality: N/A;   TEE WITHOUT CARDIOVERSION N/A 10/04/2016   Procedure: TRANSESOPHAGEAL ECHOCARDIOGRAM (TEE);  Surgeon: Gaye Pollack, MD;  Location: [facility];  Service: Open Heart Surgery;  Laterality: N/A;    SH: Social History   Tobacco Use   Smoking status: Former    Types: Cigars    Quit date: 09/16/2016    Years since quitting: 5.0   Smokeless tobacco: Never  Vaping Use   Vaping Use: Never used  Substance Use Topics   Alcohol use: Yes    Comment: very rare   Drug use: No    ROS: Constitutional:  Negative for fever, chills, weight loss CV: Negative for chest pain Respiratory:  Negative for shortness of breath, wheezing, sleep apnea, frequent cough GI:  Negative for nausea, vomiting, bloody stool, GERD  PE: BP 118/72   Pulse 85   Ht 5\' 5"  (1.651 m)   Wt 174 lb (78.9 kg)   BMI 28.96 kg/m  GENERAL  APPEARANCE:  Well appearing, well developed, well nourished, NAD HEENT:  Atraumatic, normocephalic NECK:  Supple. Trachea midline ABDOMEN:  Soft, non-tender, no masses EXTREMITIES: Electric wheelchair mobility with minimal motor function remaining of his lower extremities. NEUROLOGIC:  Alert and oriented x 3, SKIN:  Warm, dry, and intact.  Localized area of erythema noted on the medial aspect of his left thigh where Foley tubing has been rubbing his skin.  It appears superficial without drainage, induration, evidence of infection.   Results: Laboratory Data: Lab Results  Component Value Date   WBC 10.6 (H) 07/31/2021   HGB 15.5 07/31/2021   HCT 48.7 07/31/2021   MCV 82.0 07/31/2021   PLT 257 07/31/2021    Lab Results  Component Value Date  CREATININE 0.58 (L) 07/31/2021    No results found for: PSA  No results found for: TESTOSTERONE  Lab Results  Component Value Date   HGBA1C 7.7 (H) 07/13/2019    Urinalysis    Component Value Date/Time   COLORURINE YELLOW 07/31/2021 1005   APPEARANCEUR CLEAR 07/31/2021 1005   LABSPEC 1.012 07/31/2021 1005   PHURINE 7.0 07/31/2021 1005   GLUCOSEU >1,000 (A) 07/31/2021 1005   HGBUR TRACE (A) 07/31/2021 1005   BILIRUBINUR NEGATIVE 07/31/2021 1005   KETONESUR NEGATIVE 07/31/2021 1005   PROTEINUR 30 (A) 07/31/2021 1005   NITRITE NEGATIVE 07/31/2021 1005   LEUKOCYTESUR NEGATIVE 07/31/2021 1005    Lab Results  Component Value Date   BACTERIA NONE SEEN 09/30/2016    Pertinent Imaging:  No results found for this or any previous visit.  No results found for this or any previous visit.  No results found for this or any previous visit.  No results found for this or any previous visit.  No results found for this or any previous visit.  No results found for this or any previous visit.  No results found for this or any previous visit.  No results found for this or any previous visit.  No results found for this or any  previous visit (from the past 24 hour(s)).

## 2021-09-16 NOTE — Progress Notes (Signed)
Fill and Pull Catheter Removal  Patient is present today for a catheter removal.  Patient was cleaned and prepped in a sterile fashion 160ml of sterile water/ saline was instilled into the bladder when the patient felt the urge to urinate. 66ml of water was then drained from the balloon.  A 16FR foley cath was removed from the bladder no complications were noted .  Patient as then given some time to void on their own.  Patient can void  133ml on their own after some time.  Patient tolerated well.  Performed by: Levi Aland, CMA  Follow up/ Additional notes: Follow up as scheduled.     Simple Catheter Placement  Patient requested placing foley catheter today.  Patient was cleaned and prepped in a sterile fashion with betadine. A 16 FR foley catheter was inserted, urine return was noted  35ml, urine was yellow in color.  The balloon was filled with 10cc of sterile water.  A bed bag was attached for drainage. Patient was also given a night bag to take home and was given instruction on how to change from one bag to another.  Patient was given instruction on proper catheter care.  Patient tolerated well, no complications were noted   Performed by: Levi Aland, CMA  Additional notes/ Follow up: Follow up as scheduled.

## 2021-09-17 ENCOUNTER — Ambulatory Visit: Payer: Medicare Other | Admitting: Podiatry

## 2021-09-18 ENCOUNTER — Ambulatory Visit: Payer: Medicare Other | Admitting: Physician Assistant

## 2021-09-18 ENCOUNTER — Telehealth: Payer: Self-pay

## 2021-09-18 NOTE — Telephone Encounter (Signed)
Patient states that he had some discharge around the catheter and is asking for some antibiotics.  Denies any other symptoms. Advised patient to go to ER if he develops a fever over the weekend.  Also aware our office is closed and no providers here at this time. Please advise.

## 2021-09-18 NOTE — Telephone Encounter (Signed)
Patient called advising he had some questions regarding his catheter. He advised he would be available at your earliest convenience.    Thank you

## 2021-09-23 ENCOUNTER — Telehealth: Payer: Self-pay | Admitting: Physician Assistant

## 2021-09-23 ENCOUNTER — Encounter: Payer: Self-pay | Admitting: Physician Assistant

## 2021-09-23 NOTE — Telephone Encounter (Signed)
The patient called with concerns of white sediment noted coming from the Foley.  The amount of sediment has reduced since initial call 5 days ago.  He is requesting an antibiotic to cover for infection.  He denies fever, chills, nausea or vomiting, pain, body aches or other symptoms of illness.  Reassured the patient that antibiotics are not indicated and that sediment is a normal finding and chronic indwelling Foley's.  If these findings worsen, or he has concerns, he will call back or come in for evaluation.

## 2021-09-28 ENCOUNTER — Encounter: Payer: Self-pay | Admitting: *Deleted

## 2021-10-15 ENCOUNTER — Ambulatory Visit (INDEPENDENT_AMBULATORY_CARE_PROVIDER_SITE_OTHER): Payer: Medicare Other | Admitting: Physician Assistant

## 2021-10-15 DIAGNOSIS — R339 Retention of urine, unspecified: Secondary | ICD-10-CM

## 2021-10-15 NOTE — Progress Notes (Signed)
Cath Change/ Replacement  Patient is present today for a catheter change due to urinary retention.  7ml of water was removed from the balloon, a 16FR foley cath was removed with out difficulty.  Patient was cleaned and prepped in a sterile fashion with betadine. A 16 FR foley cath was replaced into the bladder no complications were noted Urine return was noted 36ml and urine was yellow in color. The balloon was filled with 37ml of sterile water. A leg bag was attached for drainage.  A night bag was also given to the patient and patient was given instruction on how to change from one bag to another. Patient was given proper instruction on catheter care.    Performed by: Guss Bunde, CMA  Follow up: Follow up in 1 month.    The pt wants to consider SP tube placement and potentially transferring care to [facility] in [place]. SP information provided. Pt will return in one month for cath change and will let us know then whether he wants to proceed with SP placement with IR and the transfer to urology closer to his home. Hospice consult has been initiated.

## 2021-11-01 ENCOUNTER — Telehealth: Payer: Self-pay | Admitting: Podiatry

## 2021-11-01 MED ORDER — DOXYCYCLINE HYCLATE 100 MG PO TABS: 100.0000 mg | ORAL_TABLET | Freq: Two times a day (BID) | ORAL | 0 refills | Status: AC

## 2021-11-01 NOTE — Telephone Encounter (Signed)
Patient called the answering service stating that his 5th toenail has partially come off yesterday. He is concerned that it may be getting infected and wants to come in to be seen. I have sent doxycyline to the pharmacy for him and recommended antibiotic ointment and a bandage. Can we please get him scheduled to come in Monday afternoon to see someone? He has therapy appointments in the morning.

## 2021-11-02 ENCOUNTER — Ambulatory Visit (INDEPENDENT_AMBULATORY_CARE_PROVIDER_SITE_OTHER): Payer: Medicare Other | Admitting: Podiatry

## 2021-11-02 DIAGNOSIS — L03031 Cellulitis of right toe: Secondary | ICD-10-CM | POA: Diagnosis not present

## 2021-11-02 NOTE — Telephone Encounter (Signed)
Patient son called this morning. I scheduled him with Dr. Logan Bores today at 1:45

## 2021-11-02 NOTE — Progress Notes (Signed)
Chief Complaint  Patient presents with   Nail Problem    right foot 5th toenail has partially came off. Patient noticed injury on this past Friday     HPI: 71 y.o. male presenting today as an urgent work in for evaluation of an injury to the right fifth toe.  Patient states that his right fifth toenail is mostly detached.  He is wheelchair-bound and cannot recall an incident of injuring his toe however he noticed Friday that his toenail was hanging off.  He is sensate and is tender to touch and palpation.  Prescription for doxycycline was sent into the pharmacy over the weekend  Past Medical History:  Diagnosis Date   Aortic stenosis    s/p AVR using a 21mm Edwards Magna-Ease pericardial valve 10/04/16   Coronary artery disease    Diabetes mellitus without complication (HCC)    GERD (gastroesophageal reflux disease)    Heart murmur    Hypertension    Inguinal hernia 1970s   Obstructive sleep apnea    positive from a home test, still needs to get another study done   Persistent atrial fibrillation (HCC)    s/p clipping of LA appendage 10/04/16   Right carotid bruit 12/12/2018   Right foot drop 11/07/2018   Stroke (HCC) 12/12/2018    Past Surgical History:  Procedure Laterality Date   ANTERIOR LAT LUMBAR FUSION N/A 07/17/2019   Procedure: Lumbar one-two Lumbar two-three Lumbar three-four Anterolateral lumbar interbody fusion with posterior fixation Lumbar one to Lumbar four with Mazor;  Surgeon: [name], MD;  Location: [facility] OR;  Service: Neurosurgery;  Laterality: N/A;   AORTIC VALVE REPLACEMENT N/A 10/04/2016   Procedure: AORTIC VALVE REPLACEMENT (AVR);  Surgeon: [name], MD;  Location: [facility] OR;  Service: Open Heart Surgery;  Laterality: N/A;   APPENDECTOMY     APPLICATION OF ROBOTIC ASSISTANCE FOR SPINAL PROCEDURE N/A 07/17/2019   Procedure: APPLICATION OF ROBOTIC ASSISTANCE FOR SPINAL PROCEDURE;  Surgeon: [name], MD;  Location: [facility];  Service: Neurosurgery;   Laterality: N/A;   BACK SURGERY     CARDIAC CATHETERIZATION N/A 03/10/2016   Procedure: Right/Left Heart Cath and Coronary Angiography;  Surgeon: [name], MD;  Location: [facility] INVASIVE CV LAB;  Service: Cardiovascular;  Laterality: N/A;   CERVICAL SPINE SURGERY     07/13/2019: per patient 2020   CLIPPING OF ATRIAL APPENDAGE N/A 10/04/2016   Procedure: CLIPPING OF ATRIAL APPENDAGE;  Surgeon: [name], MD;  Location: [facility] OR;  Service: Open Heart Surgery;  Laterality: N/A;   COLONOSCOPY     EYE SURGERY Right    growth removed from eye   HERNIA REPAIR Bilateral    inguinal   LUMBAR PERCUTANEOUS PEDICLE SCREW 3 LEVEL N/A 07/17/2019   Procedure: Posterior fixation Lumbar 1 to Lumbar 4;  Surgeon: [name], MD;  Location: [facility] OR;  Service: Neurosurgery;  Laterality: N/A;   TEE WITHOUT CARDIOVERSION N/A 10/04/2016   Procedure: TRANSESOPHAGEAL ECHOCARDIOGRAM (TEE);  Surgeon: [name], MD;  Location[facility] OR;  Service: Open Heart Surgery;  Laterality: N/A;    Allergies  Allergen Reactions   Penicillins Itching, Rash and Other (See Comments)    All over body Did it involve swelling of the face/tongue/throat, SOB, or low BP? No Did it involve sudden or severe rash/hives, skin peeling, or any reaction on the inside of your mouth or nose? Yes Did you need to seek medical attention at a hospital or doctor's office? No When did it  last happen?      20 + years If all above answers are "NO", may proceed with cephalosporin use.    Atorvastatin     Other reaction(s): myalgia   Crestor [Rosuvastatin] Other (See Comments)   Ketoconazole     Other reaction(s): blisters   Sulfa Antibiotics Rash    Rash     Physical Exam: General: The patient is alert and oriented x3 in no acute distress.  Dermatology: Skin is warm, dry and supple bilateral lower extremities. Negative for open lesions or macerations.  Loosely adhered right fifth toenail plate noted with associated tenderness  Vascular:  Skin is cool to touch.  Chronic edema bilateral lower extremities  Neurological: Light touch and protective threshold grossly intact  Musculoskeletal Exam: Patient wheelchair-bound secondary to stroke   Assessment: 1.  Trauma to toenail plate right fifth digit   Plan of Care:  1. Patient evaluated.  2.  The toenail is loosely adhered but the patient is very sensate and is tender to light touch and palpation.  Digital block of the area was performed using 3 mL of 2% lidocaine plain after the toe was prepped in aseptic manner. 3.  The toenail was avulsed in its entirety followed by light dressing 4.  Recommend triple antibiotic ointment and a light Band-Aid daily 5.  Return to clinic 3 weeks     Felecia Shelling, DPM [facility]  Dr. Felecia Shelling, DPM    [address] [place], [place] [address]                Office [phone]  Fax [phone]

## 2021-11-12 ENCOUNTER — Ambulatory Visit: Payer: Medicare Other | Admitting: Physician Assistant

## 2021-11-13 ENCOUNTER — Emergency Department (HOSPITAL_BASED_OUTPATIENT_CLINIC_OR_DEPARTMENT_OTHER)
Admission: EM | Admit: 2021-11-13 | Discharge: 2021-11-13 | Disposition: A | Discharge status: Home / Self Care | Payer: Medicare Other | Attending: Emergency Medicine | Admitting: Emergency Medicine

## 2021-11-13 ENCOUNTER — Other Ambulatory Visit: Payer: Self-pay

## 2021-11-13 DIAGNOSIS — R339 Retention of urine, unspecified: Secondary | ICD-10-CM | POA: Diagnosis not present

## 2021-11-13 DIAGNOSIS — E119 Type 2 diabetes mellitus without complications: Secondary | ICD-10-CM | POA: Insufficient documentation

## 2021-11-13 DIAGNOSIS — Z794 Long term (current) use of insulin: Secondary | ICD-10-CM | POA: Diagnosis not present

## 2021-11-13 DIAGNOSIS — I4819 Other persistent atrial fibrillation: Secondary | ICD-10-CM | POA: Diagnosis not present

## 2021-11-13 DIAGNOSIS — Z7901 Long term (current) use of anticoagulants: Secondary | ICD-10-CM | POA: Insufficient documentation

## 2021-11-13 DIAGNOSIS — R310 Gross hematuria: Secondary | ICD-10-CM | POA: Diagnosis not present

## 2021-11-13 DIAGNOSIS — Z79899 Other long term (current) drug therapy: Secondary | ICD-10-CM | POA: Diagnosis not present

## 2021-11-13 DIAGNOSIS — I10 Essential (primary) hypertension: Secondary | ICD-10-CM | POA: Diagnosis not present

## 2021-11-13 DIAGNOSIS — Z7984 Long term (current) use of oral hypoglycemic drugs: Secondary | ICD-10-CM | POA: Diagnosis not present

## 2021-11-13 DIAGNOSIS — I2510 Atherosclerotic heart disease of native coronary artery without angina pectoris: Secondary | ICD-10-CM | POA: Insufficient documentation

## 2021-11-13 LAB — BASIC METABOLIC PANEL
Anion gap: 13 (ref 5–15)
BUN: 27 mg/dL — ABNORMAL HIGH (ref 8–23)
CO2: 27 mmol/L (ref 22–32)
Calcium: 10 mg/dL (ref 8.9–10.3)
Chloride: 95 mmol/L — ABNORMAL LOW (ref 98–111)
Creatinine, Ser: 0.55 mg/dL — ABNORMAL LOW (ref 0.61–1.24)
GFR, Estimated: >60 mL/min (ref >60)
Glucose, Bld: 151 mg/dL — ABNORMAL HIGH (ref 70–99)
Potassium: 4.5 mmol/L (ref 3.5–5.1)
Sodium: 135 mmol/L (ref 135–145)

## 2021-11-13 LAB — URINALYSIS, ROUTINE W REFLEX MICROSCOPIC
Bilirubin Urine: NEGATIVE
Glucose, UA: >1000 mg/dL — AB
Ketones, ur: NEGATIVE mg/dL
Nitrite: NEGATIVE
Protein, ur: 30 mg/dL — AB
RBC / HPF: >50 RBC/hpf — ABNORMAL HIGH (ref 0–5)
Specific Gravity, Urine: 1.014 (ref 1.005–1.030)
pH: 6.5 (ref 5.0–8.0)

## 2021-11-13 LAB — PROTIME-INR
INR: 1.1 (ref 0.8–1.2)
Prothrombin Time: 14.1 seconds (ref 11.4–15.2)

## 2021-11-13 LAB — CBC WITH DIFFERENTIAL/PLATELET
Abs Immature Granulocytes: 0.03 10*3/uL (ref 0.00–0.07)
Basophils Absolute: 0.1 10*3/uL (ref 0.0–0.1)
Basophils Relative: 0 %
Eosinophils Absolute: 0.1 10*3/uL (ref 0.0–0.5)
Eosinophils Relative: 1 %
HCT: 47.7 % (ref 39.0–52.0)
Hemoglobin: 15.2 g/dL (ref 13.0–17.0)
Immature Granulocytes: 0 %
Lymphocytes Relative: 37 %
Lymphs Abs: 4.2 10*3/uL — ABNORMAL HIGH (ref 0.7–4.0)
MCH: 26 pg (ref 26.0–34.0)
MCHC: 31.9 g/dL (ref 30.0–36.0)
MCV: 81.5 fL (ref 80.0–100.0)
Monocytes Absolute: 0.3 10*3/uL (ref 0.1–1.0)
Monocytes Relative: 2 %
Neutro Abs: 6.9 10*3/uL (ref 1.7–7.7)
Neutrophils Relative %: 60 %
Platelets: 292 10*3/uL (ref 150–400)
RBC: 5.85 MIL/uL — ABNORMAL HIGH (ref 4.22–5.81)
RDW: 14.4 % (ref 11.5–15.5)
WBC: 11.6 10*3/uL — ABNORMAL HIGH (ref 4.0–10.5)
nRBC: 0 % (ref 0.0–0.2)

## 2021-11-13 LAB — APTT: aPTT: 31 seconds (ref 24–36)

## 2021-11-13 NOTE — ED Notes (Signed)
Per MD, pt is good to be dc'd with family and MD will follow up call for urine culture results.

## 2021-11-13 NOTE — ED Provider Notes (Signed)
[facility] EMERGENCY DEPT Provider Note   CSN: [id number] Arrival date & time: 11/13/21  1615     History {Add pertinent medical, surgical, social history, OB history to HPI:1} Chief Complaint  Patient presents with   foley    Blood in urine    Vernon Ruiz is a 71 y.o. male.  71 yo M with hx of ALS, urinary retention with indewlling foley catheter, and AF on eliquis who presented with bloody urine. States that he had a routine catheter change today PTA. Afterwards had gross hematuria followed by decreased urination and suprapubic discomfort. Presented for evaluation of these sxs.         Home Medications Prior to Admission medications   Medication Sig Start Date End Date Taking? Authorizing Provider  acetaminophen (TYLENOL) 325 MG tablet Take 2 tablets (650 mg total) by mouth every 6 (six) hours as needed for mild pain. 10/08/16   Ardelle Balls, PA-C  alfuzosin (UROXATRAL) 10 MG 24 hr tablet Take 1 tablet (10 mg total) by mouth at bedtime. 08/12/21   McKenzie, Mardene Celeste, MD  ALPRAZolam Prudy Feeler) 1 MG tablet Take 1-2 tablets 30 mins before MRI, one additional tablet can be used when MRI study starts. MUST have Driver 71/0/62   Levert Feinstein, MD  amLODipine (NORVASC) 5 MG tablet Take 1 tablet (5 mg total) by mouth daily. 09/14/21   [name], MD  apixaban (ELIQUIS) 5 MG TABS tablet TAKE 1 TABLET(5 MG) BY MOUTH TWICE DAILY 09/15/21   [name], MD  azithromycin (ZITHROMAX) 250 MG tablet azithromycin 250 mg tablet    [provider]  B Complex-C (B-COMPLEX WITH VITAMIN C) tablet Take 1 tablet by mouth 3 (three) times a week.    [provider]  Baclofen 5 MG TABS baclofen 5 mg tablet    [provider]  Cholecalciferol (VITAMIN D3) 250 MCG (10000 UT) capsule Take 10,000 Units by mouth 3 (three) times a week.    [provider]  clindamycin (CLEOCIN T) 1 % external solution Apply topically 2 (two) times daily. 07/16/21   Hyatt,  Max T, DPM  dexamethasone (DECADRON) 2 MG tablet dexamethasone 2 mg tablet    [provider]  dicyclomine (BENTYL) 10 MG capsule dicyclomine 10 mg capsule    [provider]  doxycycline (VIBRA-TABS) 100 MG tablet Take 1 tablet (100 mg total) by mouth 2 (two) times daily. 11/01/21   Vivi Barrack, DPM  Dulaglutide (TRULICITY) 1.5 MG/0.5ML SOPN Inject 1.5 mg into the skin every Sunday.     [provider]  empagliflozin (JARDIANCE) 25 MG TABS tablet Take 12.5 mg by mouth daily.    [provider]  famotidine (PEPCID) 20 MG tablet Take 1 tablet (20 mg total) by mouth 2 (two) times daily for 15 days. Patient taking differently: Take 20 mg by mouth 2 (two) times daily as needed (pain.). 03/18/19 06/25/20  Elgergawy, Leana Roe, MD  furosemide (LASIX) 20 MG tablet TAKE 1 TABLET BY MOUTH DAILY AS NEEDED FOR SWELLING 05/16/20   [name], MD  gabapentin (NEURONTIN) 300 MG capsule gabapentin 300 mg capsule 06/16/20   [provider]  glimepiride (AMARYL) 4 MG tablet Take 4 mg by mouth 2 (two) times daily.  10/15/18   [provider]  hydroxyurea (HYDREA) 500 MG capsule hydroxyurea 500 mg capsule    [provider]  losartan (COZAAR) 100 MG tablet Take 100 mg by mouth daily.    [provider]  losartan (  COZAAR) 50 MG tablet Take 50 mg by mouth 2 (two) times daily. 03/01/21   [provider]  meclizine (ANTIVERT) 25 MG tablet Take 25 mg by mouth 2 (two) times daily as needed (dizziness/vertigo).     [provider]  metFORMIN (GLUCOPHAGE-XR) 500 MG 24 hr tablet Take 1,000 mg by mouth 2 (two) times daily. 04/01/19   [provider]  metoprolol tartrate (LOPRESSOR) 25 MG tablet Take 1 tablet (25 mg total) by mouth 2 (two) times daily. 05/11/21   [name], MD  molnupiravir EUA (LAGEVRIO) 200 MG CAPS capsule Lagevrio 200 mg capsule (EUA)    [provider]  omeprazole (PRILOSEC) 20 MG capsule Take  20 mg by mouth 2 (two) times daily as needed. 03/19/19   [provider]  oxyCODONE-acetaminophen (PERCOCET/ROXICET) 5-325 MG tablet Take 1-2 tablets by mouth every 3 (three) hours as needed for moderate pain or severe pain. 07/20/19   [name], MD  pyridostigmine (MESTINON) 60 MG tablet pyridostigmine bromide 60 mg tablet    [provider]  RELYVRIO 3-1 g PACK Take by mouth. 07/09/21   [provider]  riluzole (RILUTEK) 50 MG tablet Take 50 mg by mouth 2 (two) times daily. 12/17/20   [provider]  rosuvastatin (CRESTOR) 20 MG tablet Take 1 tablet (20 mg total) by mouth daily. 04/10/19 07/17/19  [name], MD  TOUJEO SOLOSTAR 300 UNIT/ML Solostar Pen Inject 35 Units into the skin daily.  02/13/19   [provider]  traMADol (ULTRAM) 50 MG tablet Take 1 tablet (50 mg total) by mouth 3 (three) times daily as needed. 02/27/21   Levert Feinstein, MD  vitamin C (ASCORBIC ACID) 500 MG tablet Take 1,500 mg by mouth 3 (three) times a week.     [provider]  zinc gluconate 50 MG tablet Take 50 mg by mouth daily.    [provider]      Allergies    Penicillins, Atorvastatin, Crestor [rosuvastatin], Ketoconazole, and Sulfa antibiotics    Review of Systems   Review of Systems  Constitutional:  Positive for chills. Negative for fever.  Gastrointestinal:  Negative for diarrhea, nausea and vomiting.       No flank pain  Genitourinary:  Positive for decreased urine volume and hematuria.    Physical Exam Updated Vital Signs BP 119/81 (BP Location: Left Arm)   Pulse 94   Temp 98 F (36.7 C) (Oral)   Resp 18   Ht 5\' 5"  (1.651 m)   Wt 79.4 kg   SpO2 98%   BMI 29.12 kg/m  Physical Exam  ED Results / Procedures / Treatments   Labs (all labs ordered are listed, but only abnormal results are displayed) Labs Reviewed  CBC WITH DIFFERENTIAL/PLATELET - Abnormal; Notable for the following components:      Result Value   WBC 11.6 (*)     RBC 5.85 (*)    Lymphs Abs 4.2 (*)    All other components within normal limits  BASIC METABOLIC PANEL - Abnormal; Notable for the following components:   Chloride 95 (*)    Glucose, Bld 151 (*)    BUN 27 (*)    Creatinine, Ser 0.55 (*)    All other components within normal limits  URINE CULTURE  PROTIME-INR  APTT  URINALYSIS, ROUTINE W REFLEX MICROSCOPIC    EKG None  Radiology No results found.  Procedures Procedures  {Document cardiac monitor, telemetry assessment procedure when appropriate:1}  Medications Ordered in ED  Medications - No data to display  ED Course/ Medical Decision Making/ A&P Clinical Course as of 11/13/21 1840  Fri Nov 13, 2021  1830 Bladder scan with ~550 ml of urine present.  [RP]    Clinical Course User Index [RP] Rondel Baton, MD                           Medical Decision Making Amount and/or Complexity of Data Reviewed Labs: ordered.   ***  {Document critical care time when appropriate:1} {Document review of labs and clinical decision tools ie heart score, Chads2Vasc2 etc:1}  {Document your independent review of radiology images, and any outside records:1} {Document your discussion with family members, caretakers, and with consultants:1} {Document social determinants of health affecting pt's care:1} {Document your decision making why or why not admission, treatments were needed:1} Final Clinical Impression(s) / ED Diagnoses Final diagnoses:  None    Rx / DC Orders ED Discharge Orders     None

## 2021-11-13 NOTE — Discharge Instructions (Addendum)
Today you were seen in the emergency department for your bloody urine and urinary retention.    In the emergency department you had your foley catheter replaced and your urine cleared.    Check your MyChart online for the results of any tests that had not resulted by the time you left the emergency department. We will call you about your urine culture results as well.   Follow-up with your primary doctor in 2-3 days regarding your visit.  Follow-up with urology about your symptoms.   Return immediately to the emergency department if you experience any of the following: fevers, flank pain, vomiting, urinary retention, or any other concerning symptoms.    Thank you for visiting our Emergency Department. It was a pleasure taking care of you today.

## 2021-11-13 NOTE — ED Triage Notes (Signed)
Patient arrives with complaints of blood in urine. Patient states that he has a history of urinary retention due his medication and has a foley in place. Catheter was changed today by home health nurse and patient noted blood in bag as well as clots with minimal urine output.  Patient reports pain in his bladder a 6/10.    Hx of ALS.

## 2021-11-16 ENCOUNTER — Telehealth (HOSPITAL_BASED_OUTPATIENT_CLINIC_OR_DEPARTMENT_OTHER): Payer: Self-pay | Admitting: Emergency Medicine

## 2021-11-16 LAB — URINE CULTURE: Culture: 60000 — AB

## 2021-11-16 NOTE — Telephone Encounter (Signed)
Called to follow-up on urine culture results. Pt states that he is not having fevers or chills but is still having suprapubic discomfort. Occasionally passing clots with small amount of hematuria as well. Currently he is only growing 60k CFUs of citroacter but with his ongoing bleeding and discomfort will treat for cauti with 10 days of cefdinir which it is susceptible to. Instructed him to fu with his PCP regarding AC and ongoing bleeding since he does have a valve replacement.

## 2021-11-17 ENCOUNTER — Telehealth: Payer: Self-pay

## 2021-11-17 NOTE — Telephone Encounter (Signed)
Post ED Visit - Positive Culture Follow-up  Culture report reviewed by antimicrobial stewardship pharmacist: [facility] []  , Pharm.D. []  Enzo Bi, Pharm.D., BCPS AQ-ID [x]  , Pharm.D., BCPS []  Celedonio Miyamoto, Pharm.D., BCPS []  [place], Wilburn Cornelia.D., BCPS, AAHIVP []  , Pharm.D., BCPS, AAHIVP []  Georgina Pillion, PharmD, BCPS []  , PharmD, BCPS []  [place], PharmD, BCPS []  [place], PharmD []  , PharmD, BCPS []  Estella Husk, PharmD  Pharmacy Team []  Lysle Pearl, PharmD []  , PharmD []  Phillips Climes, PharmD []  , Rph []  Agapito Games) , PharmD []  Verlan Friends, PharmD []  , PharmD []  Mervyn Gay, PharmD []  , PharmD []  Vinnie Level, PharmD []  Wonda Olds, PharmD []  , PharmD []  Len Childs, PharmD  Spoke with pt, and confirmed that he picked up Cefdinir and is taking it.   Positive urine culture Treated with Cefdinir, organism sensitive to the same and no further patient follow-up is required at this time.  11/17/2021, 10:31 AM

## 2021-11-17 NOTE — Progress Notes (Signed)
ED Antimicrobial Stewardship Positive Culture Follow Up   Vernon Ruiz is an 71 y.o. male who presented to [facility] on 11/13/2021 with a chief complaint of  Chief Complaint  Patient presents with   foley    Blood in urine    Recent Results (from the past 720 hour(s))  Urine Culture     Status: Abnormal   Collection Time: 11/13/21  6:40 PM   Specimen: Urine, Catheterized  Result Value Ref Range Status   Specimen Description   Final    URINE, CATHETERIZED Performed at [facility], [address] [place], [place] [address]    Special Requests   Final    NONE Performed at [facility], [address], [place], [place] [address]    Culture 60,000 COLONIES/mL CITROBACTER FREUNDII (A)  Final   Report Status 11/16/2021 FINAL  Final   Organism ID, Bacteria CITROBACTER FREUNDII (A)  Final      Susceptibility   Citrobacter freundii - MIC*    CEFAZOLIN >=64 RESISTANT Resistant     CEFEPIME <=0.12 SENSITIVE Sensitive     CEFTRIAXONE <=0.25 SENSITIVE Sensitive     CIPROFLOXACIN <=0.25 SENSITIVE Sensitive     GENTAMICIN <=1 SENSITIVE Sensitive     IMIPENEM 1 SENSITIVE Sensitive     NITROFURANTOIN <=16 SENSITIVE Sensitive     TRIMETH/SULFA <=20 SENSITIVE Sensitive     PIP/TAZO <=4 SENSITIVE Sensitive     * 60,000 COLONIES/mL CITROBACTER FREUNDII    [x]  Patient discharged originally without antimicrobial agent and treatment is now indicated. Dr. called patient on 8/7 and sent in a prescription for cefdinir to pharmacy with instructions to follow up with PCP.   Please call to ensure that patient has filled prescription for cefdinir and has started taking antibiotic. (I cannot verify fill history).  New antibiotic prescription: cefdinir 300mg  by mouth twice daily x 10 days  Provider: Dr. 10/7   , PharmD, BCPS 11/17/2021, 9:31 AM Clinical Pharmacist Monday - Friday phone -  [phone] Saturday - Sunday  phone - [phone]

## 2021-11-25 ENCOUNTER — Ambulatory Visit: Payer: Medicare Other | Admitting: Podiatry

## 2021-12-08 ENCOUNTER — Ambulatory Visit: Payer: Medicare Other | Admitting: Cardiovascular Disease

## 2021-12-08 ENCOUNTER — Other Ambulatory Visit: Payer: Self-pay | Admitting: Cardiovascular Disease

## 2021-12-17 ENCOUNTER — Other Ambulatory Visit: Payer: Self-pay

## 2021-12-17 ENCOUNTER — Encounter (HOSPITAL_BASED_OUTPATIENT_CLINIC_OR_DEPARTMENT_OTHER): Payer: Self-pay | Admitting: *Deleted

## 2021-12-17 ENCOUNTER — Emergency Department (HOSPITAL_BASED_OUTPATIENT_CLINIC_OR_DEPARTMENT_OTHER)
Admission: EM | Admit: 2021-12-17 | Discharge: 2021-12-17 | Disposition: A | Discharge status: Home / Self Care | Payer: Medicare Other | Attending: Emergency Medicine | Admitting: Emergency Medicine

## 2021-12-17 DIAGNOSIS — Z79899 Other long term (current) drug therapy: Secondary | ICD-10-CM | POA: Insufficient documentation

## 2021-12-17 DIAGNOSIS — I10 Essential (primary) hypertension: Secondary | ICD-10-CM | POA: Insufficient documentation

## 2021-12-17 DIAGNOSIS — Z8673 Personal history of transient ischemic attack (TIA), and cerebral infarction without residual deficits: Secondary | ICD-10-CM | POA: Insufficient documentation

## 2021-12-17 DIAGNOSIS — Z7901 Long term (current) use of anticoagulants: Secondary | ICD-10-CM | POA: Insufficient documentation

## 2021-12-17 DIAGNOSIS — R319 Hematuria, unspecified: Secondary | ICD-10-CM | POA: Insufficient documentation

## 2021-12-17 DIAGNOSIS — E119 Type 2 diabetes mellitus without complications: Secondary | ICD-10-CM | POA: Diagnosis not present

## 2021-12-17 DIAGNOSIS — T83511A Infection and inflammatory reaction due to indwelling urethral catheter, initial encounter: Secondary | ICD-10-CM | POA: Diagnosis not present

## 2021-12-17 DIAGNOSIS — Z7984 Long term (current) use of oral hypoglycemic drugs: Secondary | ICD-10-CM | POA: Diagnosis not present

## 2021-12-17 DIAGNOSIS — I2510 Atherosclerotic heart disease of native coronary artery without angina pectoris: Secondary | ICD-10-CM | POA: Insufficient documentation

## 2021-12-17 MED ORDER — LIDOCAINE HCL URETHRAL/MUCOSAL 2 % EX GEL
1.0000 | Freq: Once | CUTANEOUS | Status: AC
  Administered 2021-12-17: 1 via URETHRAL
  Filled 2021-12-17: qty 11

## 2021-12-17 NOTE — ED Provider Notes (Addendum)
[facility] EMERGENCY DEPT Provider Note   CSN: [id number] Arrival date & time: 12/17/21  1458     History  Chief Complaint  Patient presents with   Hematuria    Vernon Ruiz is a 71 y.o. male.  71 yo male with a history of aortic stenosis status post valve replacements, CAD, stroke, and atrial fibrillation on eliquis who presents the emergency department with hematuria and Foley catheter difficulty.  Patient states that home health nurse was changing his Foley catheter today and had difficulty inserting it.  Says that there is been significant bleeding from the tip of his penis since then.  Says that there are approximately 7 attempts at replacing the Foley.  Denied any pain, fevers, or vomiting before that.  Finished a course of antibiotics 3 days ago that he was on for urinary tract infection.  No pain or hematuria prior to the Foley replacement.   Hematuria   Past Medical History:  Diagnosis Date   Aortic stenosis    s/p AVR using a 9mm Edwards Magna-Ease pericardial valve 10/04/16   Coronary artery disease    Diabetes mellitus without complication (HCC)    GERD (gastroesophageal reflux disease)    Heart murmur    Hypertension    Inguinal hernia 1970s   Obstructive sleep apnea    positive from a home test, still needs to get another study done   Persistent atrial fibrillation (HCC)    s/p clipping of LA appendage 10/04/16   Right carotid bruit 12/12/2018   Right foot drop 11/07/2018   Stroke (HCC) 12/12/2018      Home Medications Prior to Admission medications   Medication Sig Start Date End Date Taking? Authorizing Provider  acetaminophen (TYLENOL) 325 MG tablet Take 2 tablets (650 mg total) by mouth every 6 (six) hours as needed for mild pain. 10/08/16   Ardelle Balls, PA-C  alfuzosin (UROXATRAL) 10 MG 24 hr tablet Take 1 tablet (10 mg total) by mouth at bedtime. 08/12/21   McKenzie, Mardene Celeste, MD  ALPRAZolam Prudy Feeler) 1 MG tablet Take 1-2 tablets 30  mins before MRI, one additional tablet can be used when MRI study starts. MUST have Driver 45/6/25   Levert Feinstein, MD  amLODipine (NORVASC) 5 MG tablet Take 1 tablet (5 mg total) by mouth daily. Please call [phone] to schedule an appointment for future refills. Thank you. 1st attempt. 12/08/21   [name], MD  apixaban (ELIQUIS) 5 MG TABS tablet TAKE 1 TABLET(5 MG) BY MOUTH TWICE DAILY 09/15/21   [name], MD  azithromycin (ZITHROMAX) 250 MG tablet azithromycin 250 mg tablet    [provider]  B Complex-C (B-COMPLEX WITH VITAMIN C) tablet Take 1 tablet by mouth 3 (three) times a week.    [provider]  Baclofen 5 MG TABS baclofen 5 mg tablet    [provider]  Cholecalciferol (VITAMIN D3) 250 MCG (10000 UT) capsule Take 10,000 Units by mouth 3 (three) times a week.    [provider]  clindamycin (CLEOCIN T) 1 % external solution Apply topically 2 (two) times daily. 07/16/21   Hyatt, Max T, DPM  dexamethasone (DECADRON) 2 MG tablet dexamethasone 2 mg tablet    [provider]  dicyclomine (BENTYL) 10 MG capsule dicyclomine 10 mg capsule    [provider]  doxycycline (VIBRA-TABS) 100 MG tablet Take 1 tablet (100 mg total) by mouth 2 (two) times daily. 11/01/21   Vivi Barrack, DPM  Dulaglutide (TRULICITY) 1.5  MG/0.5ML SOPN Inject 1.5 mg into the skin every Sunday.     [provider]  empagliflozin (JARDIANCE) 25 MG TABS tablet Take 12.5 mg by mouth daily.    [provider]  famotidine (PEPCID) 20 MG tablet Take 1 tablet (20 mg total) by mouth 2 (two) times daily for 15 days. Patient taking differently: Take 20 mg by mouth 2 (two) times daily as needed (pain.). 03/18/19 06/25/20  Elgergawy, Leana Roe, MD  furosemide (LASIX) 20 MG tablet TAKE 1 TABLET BY MOUTH DAILY AS NEEDED FOR SWELLING 05/16/20   [name], MD  gabapentin (NEURONTIN) 300 MG capsule gabapentin 300 mg capsule 06/16/20   [provider]  glimepiride (AMARYL) 4 MG tablet Take 4 mg by mouth 2 (two) times daily.  10/15/18   [provider]  hydroxyurea (HYDREA) 500 MG capsule hydroxyurea 500 mg capsule    [provider]  losartan (COZAAR) 100 MG tablet Take 100 mg by mouth daily.    [provider]  losartan (COZAAR) 50 MG tablet Take 50 mg by mouth 2 (two) times daily. 03/01/21   [provider]  meclizine (ANTIVERT) 25 MG tablet Take 25 mg by mouth 2 (two) times daily as needed (dizziness/vertigo).     [provider]  metFORMIN (GLUCOPHAGE-XR) 500 MG 24 hr tablet Take 1,000 mg by mouth 2 (two) times daily. 04/01/19   [provider]  metoprolol tartrate (LOPRESSOR) 25 MG tablet Take 1 tablet (25 mg total) by mouth 2 (two) times daily. 05/11/21   [name], MD  molnupiravir EUA (LAGEVRIO) 200 MG CAPS capsule Lagevrio 200 mg capsule (EUA)    [provider]  omeprazole (PRILOSEC) 20 MG capsule Take 20 mg by mouth 2 (two) times daily as needed. 03/19/19   [provider]  oxyCODONE-acetaminophen (PERCOCET/ROXICET) 5-325 MG tablet Take 1-2 tablets by mouth every 3 (three) hours as needed for moderate pain or severe pain. 07/20/19   [name], MD  pyridostigmine (MESTINON) 60 MG tablet pyridostigmine bromide 60 mg tablet    [provider]  RELYVRIO 3-1 g PACK Take by mouth. 07/09/21   [provider]  riluzole (RILUTEK) 50 MG tablet Take 50 mg by mouth 2 (two) times daily. 12/17/20   [provider]  rosuvastatin (CRESTOR) 20 MG tablet Take 1 tablet (20 mg total) by mouth daily. 04/10/19 07/17/19  [name], MD  TOUJEO SOLOSTAR 300 UNIT/ML Solostar Pen Inject 35 Units into the skin daily.  02/13/19   [provider]  traMADol (ULTRAM) 50 MG tablet Take 1 tablet (50 mg total) by mouth 3 (three) times daily as needed. 02/27/21   Levert Feinstein, MD  vitamin C (ASCORBIC ACID) 500 MG tablet Take 1,500 mg by mouth 3 (three) times a  week.     [provider]  zinc gluconate 50 MG tablet Take 50 mg by mouth daily.    [provider]      Allergies    Penicillins, Atorvastatin, Crestor [rosuvastatin], Ketoconazole, and Sulfa antibiotics    Review of Systems   Review of Systems  Genitourinary:  Positive for hematuria.    Physical Exam Updated Vital Signs BP 115/73 (BP Location: Left Arm)   Pulse 83   Temp 98.3 F (36.8 C)   Resp 20   SpO2 98%  Physical Exam Vitals and nursing note reviewed.  Constitutional:      General: He is not in acute distress.    Appearance: He is well-developed.  Comments: In wheelchair  HENT:     Head: Normocephalic and atraumatic.     Right Ear: External ear normal.     Left Ear: External ear normal.     Nose: Nose normal.  Eyes:     Extraocular Movements: Extraocular movements intact.     Conjunctiva/sclera: Conjunctivae normal.     Pupils: Pupils are equal, round, and reactive to light.  Pulmonary:     Effort: Pulmonary effort is normal. No respiratory distress.  Abdominal:     General: There is no distension.     Palpations: Abdomen is soft. There is no mass.     Tenderness: There is no abdominal tenderness. There is no guarding.  Genitourinary:    Comments: Significant bleeding from urethral meatus Musculoskeletal:        General: No swelling.     Cervical back: Normal range of motion and neck supple.     Right lower leg: No edema.     Left lower leg: No edema.  Skin:    Capillary Refill: Capillary refill takes less than 2 seconds.  Neurological:     Mental Status: He is alert. Mental status is at baseline.  Psychiatric:        Mood and Affect: Mood normal.        Behavior: Behavior normal.     ED Results / Procedures / Treatments   Labs (all labs ordered are listed, but only abnormal results are displayed) Labs Reviewed  URINE CULTURE  URINALYSIS, ROUTINE W REFLEX MICROSCOPIC    EKG None  Radiology No results  found.  Procedures Procedures   Medications Ordered in ED Medications  lidocaine (XYLOCAINE) 2 % jelly 1 Application (1 Application Urethral Given 12/17/21 1630)    ED Course/ Medical Decision Making/ A&P Clinical Course as of 12/17/21 1736  Thu Dec 17, 2021  1649 Have tried to Foley times with coud catheter and were unsuccessful.  Urology paged. [RP]  1651 Dr Cardell Peach from has requested the patient be transferred to [facility] for further management and Foley catheter placement by urology. [RP]  1735 Discussed ED to ED transfer with Dr Silverio Lay from [facility] who has accepted the patient.  [RP]    Clinical Course User Index [RP] Rondel Baton, MD                           Medical Decision Making Amount and/or Complexity of Data Reviewed Labs: ordered.   71 yo male with a history of aortic stenosis status post valve replacements, CAD, stroke, and atrial fibrillation on eliquis who presents the emergency department with hematuria and Foley catheter difficulty.   Initial Ddx:  Traumatic Foley insertion, false passage, UTI  MDM:  Feel the patient likely had a traumatic Foley insertion as the symptoms not start prior to this.  Patient is already had approximately 7 attempts at home.  No infectious symptoms at this time.  Will attempt Foley catheter again and consider urology consultation if unsuccessful.  ED Summary:  Attempted Foley catheter placement with 2 coud catheters that were not successful.  Did discuss with urology who requested the patient be sent to [facility] for further management.  Discussed transfer with the patient's family who accepted and will transfer him via POV.  Discussed with the attending physician on-call at [facility] who is expecting him for an ED to ED transfer.  Dispo: Transfer to [facility]   Additional history obtained from family Consults:  Urology  Final Clinical Impression(s) / ED Diagnoses Final diagnoses:  Hematuria, unspecified type  Chronic indwelling  Foley catheter    Rx / DC Orders ED Discharge Orders     None         Rondel Baton, MD 12/17/21 1724    Rondel Baton, MD 12/17/21 1736

## 2021-12-17 NOTE — ED Triage Notes (Signed)
Pt is here due to bleeding and blood clots from his penis.  Pt has foley cath which home health changes monthly. Per pt today home health came to change foley and they removed the cath and were unsuccessful at placing anther foley.  4 attempts were made to place new foley followed by a break and another 3 attempts were made without success.  Pt then began having bleeding from penis including passing clots.

## 2021-12-17 NOTE — Consult Note (Signed)
Urology Consult   Physician requesting consult: Earmon Phoenix, MD  Reason for consult: Traumatic foley catheter, gross hematuria  History of Present Illness: Vernon Ruiz is a 71 y.o. is a patient of Dr. Ronne Binning who was seen initially in 08/2021 with urinary retention.  In 07/2021, he presented the ER with urinary to retention.  Foley catheter was placed with return of 1 L urine.  He was then started on tamsulosin.  He failed void trial on 08/18/2021.  Failed void trial again on 09/16/2021.  Urodynamics was ordered however he had not yet been done.  He had routine Foley exchange on 11/13/2021 without difficulty.  Foley was attempted to be exchanged with home nursing earlier today on 12/17/2021.  Unfortunately, nursing at home were unable to replace Foley catheter with 7 attempts..  He started developing blood per urethra.  Multiple attempts by nursing staff in the ED at [facility] were unsuccessful.  He was transferred to [facility] for further urologic care.   Past Medical History:  Diagnosis Date   Aortic stenosis    s/p AVR using a 26mm Edwards Magna-Ease pericardial valve 10/04/16   Coronary artery disease    Diabetes mellitus without complication (HCC)    GERD (gastroesophageal reflux disease)    Heart murmur    Hypertension    Inguinal hernia 1970s   Obstructive sleep apnea    positive from a home test, still needs to get another study done   Persistent atrial fibrillation (HCC)    s/p clipping of LA appendage 10/04/16   Right carotid bruit 12/12/2018   Right foot drop 11/07/2018   Stroke (HCC) 12/12/2018    Past Surgical History:  Procedure Laterality Date   ANTERIOR LAT LUMBAR FUSION N/A 07/17/2019   Procedure: Lumbar one-two Lumbar two-three Lumbar three-four Anterolateral lumbar interbody fusion with posterior fixation Lumbar one to Lumbar four with Mazor;  Surgeon: [name], MD;  Location: [facility] OR;  Service: Neurosurgery;  Laterality: N/A;   AORTIC VALVE REPLACEMENT N/A 10/04/2016    Procedure: AORTIC VALVE REPLACEMENT (AVR);  Surgeon: [name], MD;  Location: [facility] OR;  Service: Open Heart Surgery;  Laterality: N/A;   APPENDECTOMY     APPLICATION OF ROBOTIC ASSISTANCE FOR SPINAL PROCEDURE N/A 07/17/2019   Procedure: APPLICATION OF ROBOTIC ASSISTANCE FOR SPINAL PROCEDURE;  Surgeon: [name], MD;  Location: [facility] OR;  Service: Neurosurgery;  Laterality: N/A;   BACK SURGERY     CARDIAC CATHETERIZATION N/A 03/10/2016   Procedure: Right/Left Heart Cath and Coronary Angiography;  Surgeon: [name], MD;  Location: [facility] INVASIVE CV LAB;  Service: Cardiovascular;  Laterality: N/A;   CERVICAL SPINE SURGERY     07/13/2019: per patient 2020   CLIPPING OF ATRIAL APPENDAGE N/A 10/04/2016   Procedure: CLIPPING OF ATRIAL APPENDAGE;  Surgeon: [name], MD;  Location: [facility] OR;  Service: Open Heart Surgery;  Laterality: N/A;   COLONOSCOPY     EYE SURGERY Right    growth removed from eye   HERNIA REPAIR Bilateral    inguinal   LUMBAR PERCUTANEOUS PEDICLE SCREW 3 LEVEL N/A 07/17/2019   Procedure: Posterior fixation Lumbar 1 to Lumbar 4;  Surgeon: [name], MD;  Location: [facility] OR;  Service: Neurosurgery;  Laterality: N/A;   TEE WITHOUT CARDIOVERSION N/A 10/04/2016   Procedure: TRANSESOPHAGEAL ECHOCARDIOGRAM (TEE);  Surgeon: [name], MD;  Location: [facility] OR;  Service: Open Heart Surgery;  Laterality: N/A;    Current Hospital Medications:  Home Meds:  No current facility-administered  medications on file prior to encounter.   Current Outpatient Medications on File Prior to Encounter  Medication Sig Dispense Refill   acetaminophen (TYLENOL) 325 MG tablet Take 2 tablets (650 mg total) by mouth every 6 (six) hours as needed for mild pain.     alfuzosin (UROXATRAL) 10 MG 24 hr tablet Take 1 tablet (10 mg total) by mouth at bedtime. 30 tablet 11   ALPRAZolam (XANAX) 1 MG tablet Take 1-2 tablets 30 mins before MRI, one additional tablet can be used when MRI study starts. MUST  have Driver 3 tablet 0   amLODipine (NORVASC) 5 MG tablet Take 1 tablet (5 mg total) by mouth daily. Please call [phone] to schedule an appointment for future refills. Thank you. 1st attempt. 30 tablet 0   apixaban (ELIQUIS) 5 MG TABS tablet TAKE 1 TABLET(5 MG) BY MOUTH TWICE DAILY 180 tablet 0   azithromycin (ZITHROMAX) 250 MG tablet azithromycin 250 mg tablet     B Complex-C (B-COMPLEX WITH VITAMIN C) tablet Take 1 tablet by mouth 3 (three) times a week.     Baclofen 5 MG TABS baclofen 5 mg tablet     Cholecalciferol (VITAMIN D3) 250 MCG (10000 UT) capsule Take 10,000 Units by mouth 3 (three) times a week.     clindamycin (CLEOCIN T) 1 % external solution Apply topically 2 (two) times daily. 60 mL 2   dexamethasone (DECADRON) 2 MG tablet dexamethasone 2 mg tablet     dicyclomine (BENTYL) 10 MG capsule dicyclomine 10 mg capsule     doxycycline (VIBRA-TABS) 100 MG tablet Take 1 tablet (100 mg total) by mouth 2 (two) times daily. 20 tablet 0   Dulaglutide (TRULICITY) 1.5 MG/0.5ML SOPN Inject 1.5 mg into the skin every Sunday.      empagliflozin (JARDIANCE) 25 MG TABS tablet Take 12.5 mg by mouth daily.     famotidine (PEPCID) 20 MG tablet Take 1 tablet (20 mg total) by mouth 2 (two) times daily for 15 days. (Patient taking differently: Take 20 mg by mouth 2 (two) times daily as needed (pain.).) 30 tablet 0   furosemide (LASIX) 20 MG tablet TAKE 1 TABLET BY MOUTH DAILY AS NEEDED FOR SWELLING 90 tablet 3   gabapentin (NEURONTIN) 300 MG capsule gabapentin 300 mg capsule     glimepiride (AMARYL) 4 MG tablet Take 4 mg by mouth 2 (two) times daily.      hydroxyurea (HYDREA) 500 MG capsule hydroxyurea 500 mg capsule     losartan (COZAAR) 100 MG tablet Take 100 mg by mouth daily.     losartan (COZAAR) 50 MG tablet Take 50 mg by mouth 2 (two) times daily.     meclizine (ANTIVERT) 25 MG tablet Take 25 mg by mouth 2 (two) times daily as needed (dizziness/vertigo).      metFORMIN (GLUCOPHAGE-XR) 500 MG  24 hr tablet Take 1,000 mg by mouth 2 (two) times daily.     metoprolol tartrate (LOPRESSOR) 25 MG tablet Take 1 tablet (25 mg total) by mouth 2 (two) times daily. 180 tablet 3   molnupiravir EUA (LAGEVRIO) 200 MG CAPS capsule Lagevrio 200 mg capsule (EUA)     omeprazole (PRILOSEC) 20 MG capsule Take 20 mg by mouth 2 (two) times daily as needed.     oxyCODONE-acetaminophen (PERCOCET/ROXICET) 5-325 MG tablet Take 1-2 tablets by mouth every 3 (three) hours as needed for moderate pain or severe pain. 60 tablet 0   pyridostigmine (MESTINON) 60 MG tablet pyridostigmine bromide 60 mg tablet  RELYVRIO 3-1 g PACK Take by mouth.     riluzole (RILUTEK) 50 MG tablet Take 50 mg by mouth 2 (two) times daily.     rosuvastatin (CRESTOR) 20 MG tablet Take 1 tablet (20 mg total) by mouth daily. 90 tablet 3   TOUJEO SOLOSTAR 300 UNIT/ML Solostar Pen Inject 35 Units into the skin daily.      traMADol (ULTRAM) 50 MG tablet Take 1 tablet (50 mg total) by mouth 3 (three) times daily as needed. 90 tablet 5   vitamin C (ASCORBIC ACID) 500 MG tablet Take 1,500 mg by mouth 3 (three) times a week.      zinc gluconate 50 MG tablet Take 50 mg by mouth daily.       Scheduled Meds: Continuous Infusions: PRN Meds:.  Allergies:  Allergies  Allergen Reactions   Penicillins Itching, Rash and Other (See Comments)    All over body Did it involve swelling of the face/tongue/throat, SOB, or low BP? No Did it involve sudden or severe rash/hives, skin peeling, or any reaction on the inside of your mouth or nose? Yes Did you need to seek medical attention at a hospital or doctor's office? No When did it last happen?      20 + years If all above answers are "NO", may proceed with cephalosporin use.    Atorvastatin     Other reaction(s): myalgia   Crestor [Rosuvastatin] Other (See Comments)   Ketoconazole     Other reaction(s): blisters   Sulfa Antibiotics Rash    Rash    No family history on file.  Social History:   reports that he quit smoking about 5 years ago. His smoking use included cigars. He has never used smokeless tobacco. He reports current alcohol use. He reports that he does not use drugs.  ROS: A complete review of systems was performed.  All systems are negative except for pertinent findings as noted.  Physical Exam:  Vital signs in last 24 hours: Temp:  [98.1 F (36.7 C)-98.3 F (36.8 C)] 98.1 F (36.7 C) (09/07 1835) Pulse Rate:  [80-87] 85 (09/07 1839) Resp:  [16-20] 16 (09/07 1839) BP: (110-119)/(70-97) 119/97 (09/07 1839) SpO2:  [91 %-98 %] 91 % (09/07 1839) Constitutional:  Alert and oriented, No acute distress Cardiovascular: Regular rate and rhythm Respiratory: Normal respiratory effort, Lungs clear bilaterally GI: Abdomen is soft, nontender, nondistended, no abdominal masses GU: No CVA tenderness Neurologic: Grossly intact, no focal deficits Psychiatric: Normal mood and affect  Laboratory Data:  No results for input(s): "WBC", "HGB", "HCT", "PLT" in the last 72 hours.  No results for input(s): "NA", "K", "CL", "GLUCOSE", "BUN", "CALCIUM", "CREATININE" in the last 72 hours.  Invalid input(s): "CO3"   No results found for this or any previous visit (from the past 24 hour(s)). No results found for this or any previous visit (from the past 240 hour(s)).  Renal Function: No results for input(s): "CREATININE" in the last 168 hours. CrCl cannot be calculated (Patient's most recent lab result is older than the maximum 21 days allowed.).  Radiologic Imaging: No results found.  I independently reviewed the above imaging studies.  Procedure note: Under sterile conditions, I prepped and draped his penis.  I then placed a 17 French flexible cystoscope the urethra.  Normal anterior urethra.  In the prostate, he had a trilobar prostate with obstructing lobes.  He did have an elongated urethra.  There was evidence of a false passage within the prostate just proximal to the  verumontanum.  No significant blood clots were present.  I advanced into the bladder.  There was no obvious bladder lesions.  I then passed a 0.038 sensor wire in the bladder.  Over the wire, placed a 16 French Foley catheter.  I instilled 10 mL sterile water.  There is immediate return of 500 ml clear yellow urine.  Impression/Recommendation Traumatic Foley catheter 2.  Urinary retention  -Leave Foley catheter in place to allow urethra to heal. -I will arrange follow-up with [facility].  He will need urodynamics, TRUS ultrasound to evaluate bladder and prostate function prior to considering bladder outlet obstruction procedure.   Matt R. Brigg Cape MD 12/17/2021, 7:13 PM  [facility]  Pager: [phone]   CC: Earmon Phoenix, MD

## 2021-12-17 NOTE — ED Notes (Signed)
Report given to the Charge at [facility] ED.

## 2021-12-17 NOTE — ED Notes (Signed)
An After Visit Summary was printed and given to the patient. Discharge instructions given and no further questions at this time.  Pt leaving with family.  

## 2021-12-17 NOTE — ED Notes (Signed)
Foley attempted by Corrie Dandy, RN and I assisted. Unable to fully insert. Provider informed.

## 2021-12-17 NOTE — ED Notes (Signed)
Urine specimen was not collected: this was entered on the wrong patient.

## 2021-12-17 NOTE — ED Notes (Signed)
Pt assisted to the vehicle to travel to [facility] ED.

## 2021-12-17 NOTE — Discharge Instructions (Addendum)
Continue your current meds.  See urology for follow-up for Foley removal  Return to ER if you have worse hematuria, unable to urinate

## 2021-12-17 NOTE — ED Provider Notes (Signed)
  Physical Exam  BP (!) 113/101   Pulse 90   Temp 98.1 F (36.7 C) (Oral)   Resp 17   SpO2 93%   Physical Exam  Procedures  Procedures  ED Course / MDM   Clinical Course as of 12/17/21 2010  Thu Dec 17, 2021  1649 Have tried to Foley times with coud catheter and were unsuccessful.  Urology paged. [RP]  1651 Dr Cardell Peach from has requested the patient be transferred to [facility] for further management and Foley catheter placement by urology. [RP]  1735 Discussed ED to ED transfer with Dr Silverio Lay from [facility] who has accepted the patient.  [RP]    Clinical Course User Index [RP] Rondel Baton, MD   Medical Decision Making Patient is transferred here to see urology.  I messaged Dr. Cardell Peach on arrival.  He request urology card and will place Foley  8:11 PM Foley placed by urology and clear urine came out.  They will have him follow-up in the office.  Amount and/or Complexity of Data Reviewed Labs: ordered.          Charlynne Pander, MD 12/17/21 2011

## 2021-12-18 ENCOUNTER — Other Ambulatory Visit: Payer: Self-pay | Admitting: Cardiovascular Disease

## 2021-12-18 NOTE — Telephone Encounter (Signed)
Prescription refill request for Eliquis received. Indication:Afib Last office visit:Needs Appt Scr:0.5 Age: 71 Weight:79.4 kg  Prescription refilled

## 2022-01-05 ENCOUNTER — Ambulatory Visit: Payer: Medicare Other | Admitting: Podiatry

## 2022-01-06 ENCOUNTER — Ambulatory Visit: Payer: Medicare Other | Admitting: Podiatry

## 2022-02-10 ENCOUNTER — Ambulatory Visit: Payer: Medicare Other | Admitting: Podiatry

## 2022-03-08 ENCOUNTER — Other Ambulatory Visit: Payer: Self-pay | Admitting: Cardiovascular Disease

## 2022-03-11 ENCOUNTER — Encounter: Payer: Self-pay | Admitting: Podiatry

## 2022-04-12 DEATH — deceased

## 2022-05-14 ENCOUNTER — Ambulatory Visit: Payer: Medicare Other | Admitting: Podiatry
